# Patient Record
Sex: Female | Born: 1945 | Race: Asian | Marital: Married
Health system: Western US, Academic
[De-identification: ages and names within clinical notes are randomized; demographics above are authoritative.]

## PROBLEM LIST (undated history)

## (undated) DIAGNOSIS — I10 Essential (primary) hypertension: Secondary | ICD-10-CM

## (undated) DIAGNOSIS — J45909 Unspecified asthma, uncomplicated: Secondary | ICD-10-CM

## (undated) HISTORY — PX: NECK SURGERY: SHX720

## (undated) HISTORY — PX: HIP SURGERY: SHX245

---

## 2018-11-28 NOTE — Progress Notes (Signed)
.      -  document signed in external system.

## 2018-11-29 NOTE — Progress Notes (Signed)
.      -  document signed in external system.

## 2019-04-14 NOTE — Telephone Encounter (Signed)
Called to pre screen patient according to the person that answered the phone pt lives in [place].    Will send a message to clinic

## 2019-04-20 LAB — COVID-19, RNA, RT-PCR/NAA - EX: COVID-19, RNA, RT-PCR/NAA - EX: NOT DETECTED

## 2019-04-23 ENCOUNTER — Ambulatory Visit: Admit: 2019-04-23 | Discharge: 2019-04-23 | Payer: MEDICAID

## 2019-11-03 ENCOUNTER — Encounter (HOSPITAL_BASED_OUTPATIENT_CLINIC_OR_DEPARTMENT_OTHER): Payer: Self-pay

## 2019-11-03 ENCOUNTER — Emergency Department (HOSPITAL_BASED_OUTPATIENT_CLINIC_OR_DEPARTMENT_OTHER): Payer: Medicaid Other

## 2019-11-03 ENCOUNTER — Other Ambulatory Visit: Payer: Self-pay

## 2019-11-03 ENCOUNTER — Emergency Department (HOSPITAL_BASED_OUTPATIENT_CLINIC_OR_DEPARTMENT_OTHER)
Admission: EM | Admit: 2019-11-03 | Discharge: 2019-11-04 | Disposition: A | Discharge status: Home / Self Care | Payer: Medicaid Other | Attending: Emergency Medicine | Admitting: Emergency Medicine

## 2019-11-03 DIAGNOSIS — R0602 Shortness of breath: Secondary | ICD-10-CM | POA: Diagnosis not present

## 2019-11-03 DIAGNOSIS — J45909 Unspecified asthma, uncomplicated: Secondary | ICD-10-CM | POA: Insufficient documentation

## 2019-11-03 DIAGNOSIS — I10 Essential (primary) hypertension: Secondary | ICD-10-CM | POA: Diagnosis not present

## 2019-11-03 HISTORY — DX: Essential (primary) hypertension: I10

## 2019-11-03 HISTORY — DX: Unspecified asthma, uncomplicated: J45.909

## 2019-11-03 MED ORDER — FUROSEMIDE 10 MG/ML IJ SOLN
20.0000 mg | Freq: Once | INTRAMUSCULAR | Status: AC
  Administered 2019-11-03: 20 mg via INTRAVENOUS
  Filled 2019-11-03: qty 2

## 2019-11-03 NOTE — ED Triage Notes (Addendum)
Pt c/o SOB x 2 weeks-to triage in w/c-NAD-daughter allowed to come in with pt due to language/understanding barrier-she states pt with hx of asthma-SOB x 3 days-no relief with albuterol inhaler-pt now denies CP with daughter interpreting-daughter states pt pain site is lower back with hx "she needs surgery on her spine"-pt moved from [place] to [place] in Oct 2020

## 2019-11-03 NOTE — ED Provider Notes (Signed)
[facility] Provider Note: Lowella Dell, MD, FACEP  CSN: [id number] MRN: [id number] ARRIVAL: 11/03/19 at 2158 ROOM: MH10/[facility]10   CHIEF COMPLAINT  Shortness of Breath   HISTORY OF PRESENT ILLNESS  11/03/19 11:11 PM Denise Leon is a 74 y.o. female who moved here from [place] in October 2020 I does not yet have a local PCP.  She is here with 2 weeks of shortness of breath.  Symptoms are moderate and worse when lying supine.  She has been using her albuterol inhaler without relief.  Symptoms have worsened over the past 2 days.  She denies chest pain, cough, fever or chills.  She denies nausea, vomiting or diarrhea.  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   Past Medical History:  Diagnosis Date  . Asthma   . Hypertension     Past Surgical History:  Procedure Laterality Date  . HIP SURGERY    . NECK SURGERY      No family history on file.  Social History   Tobacco Use  . Smoking status: Never Smoker  . Smokeless tobacco: Never Used  Substance Use Topics  . Alcohol use: Never  . Drug use: Never    Prior to Admission medications   Medication Sig Start Date End Date Taking? Authorizing Provider  albuterol (VENTOLIN HFA) 108 (90 Base) MCG/ACT inhaler Inhale into the lungs every 6 (six) hours as needed for wheezing or shortness of breath.   Yes [provider]    Allergies Patient has no known allergies.   REVIEW OF SYSTEMS  Negative except as noted here or in the History of Present Illness.   PHYSICAL EXAMINATION  Initial Vital Signs Blood pressure (!) 149/107, pulse 79, temperature 97.9 F (36.6 C), temperature source Oral, resp. rate 20, SpO2 98 %.  Examination General: Well-developed, well-nourished female in no acute distress; appearance consistent with age of record HENT: normocephalic; atraumatic Eyes: pupils equal, round and reactive to light; extraocular  muscles intact Neck: supple Heart: regular rate and rhythm Lungs: clear to auscultation bilaterally; mild tachypnea with shallow breaths Abdomen: soft; nondistended; nontender; bowel sounds present Extremities: No deformity; full range of motion; pulses normal Neurologic: Awake, alert; motor function intact in all extremities and symmetric; no facial droop Skin: Warm and dry Psychiatric: Normal mood and affect   RESULTS  Summary of this visit's results, reviewed and interpreted by myself:   EKG Interpretation  Date/Time:  Tuesday November 03 2019 22:03:36 EST Ventricular Rate:  80 PR Interval:  162 QRS Duration: 80 QT Interval:  390 QTC Calculation: 449 R Axis:   11 Text Interpretation: Normal sinus rhythm Left atrial enlargement No previous ECGs available Confirmed by Di Jasmer, Jonny Ruiz ([id number]) on 11/03/2019 10:44:28 PM      Laboratory Studies: Results for orders placed or performed during the hospital encounter of 11/03/19 (from the past 24 hour(s))  CBC with Differential/Platelet     Status: Abnormal   Collection Time: 11/03/19 11:52 PM  Result Value Ref Range   WBC 11.0 (H) 4.0 - 10.5 K/uL   RBC 3.72 (L) 3.87 - 5.11 MIL/uL   Hemoglobin 11.7 (L) 12.0 - 15.0 g/dL   HCT 69.7 (L) 94.8 - 01.6 %   MCV 93.0 80.0 - 100.0 fL   MCH 31.5 26.0 - 34.0 pg   MCHC 33.8 30.0 - 36.0 g/dL   RDW 55.3 74.8 - 27.0 %   Platelets 281 150 -  400 K/uL   nRBC 0.0 0.0 - 0.2 %   Neutrophils Relative % 57 %   Neutro Abs 6.3 1.7 - 7.7 K/uL   Lymphocytes Relative 31 %   Lymphs Abs 3.4 0.7 - 4.0 K/uL   Monocytes Relative 8 %   Monocytes Absolute 0.9 0.1 - 1.0 K/uL   Eosinophils Relative 3 %   Eosinophils Absolute 0.3 0.0 - 0.5 K/uL   Basophils Relative 1 %   Basophils Absolute 0.1 0.0 - 0.1 K/uL   Immature Granulocytes 0 %   Abs Immature Granulocytes 0.04 0.00 - 0.07 K/uL  Basic metabolic panel     Status: Abnormal   Collection Time: 11/03/19 11:52 PM  Result Value Ref Range   Sodium 138 135 - 145  mmol/L   Potassium 3.6 3.5 - 5.1 mmol/L   Chloride 105 98 - 111 mmol/L   CO2 23 22 - 32 mmol/L   Glucose, Bld 117 (H) 70 - 99 mg/dL   BUN 36 (H) 8 - 23 mg/dL   Creatinine, Ser 1.60 (H) 0.44 - 1.00 mg/dL   Calcium 9.1 8.9 - 10.3 mg/dL   GFR calc non Af Amer 32 (L) >60 mL/min   GFR calc Af Amer 37 (L) >60 mL/min   Anion gap 10 5 - 15  Troponin I (High Sensitivity)     Status: None   Collection Time: 11/03/19 11:52 PM  Result Value Ref Range   Troponin I (High Sensitivity) 5 <18 ng/L  Brain natriuretic peptide     Status: None   Collection Time: 11/03/19 11:52 PM  Result Value Ref Range   B Natriuretic Peptide 65.5 0.0 - 100.0 pg/mL   Imaging Studies: DG Chest 1 View  Result Date: 11/03/2019 CLINICAL DATA:  Shortness of breath EXAM: CHEST  1 VIEW COMPARISON:  None. FINDINGS: The lung volumes are somewhat low. The heart size is borderline enlarged. Aortic calcifications are noted. There may be some mild vascular congestion without overt pulmonary edema. There are end-stage degenerative changes of both glenohumeral joints, left worse than right. There is no pneumothorax. There is mild blunting of the left costophrenic angle. IMPRESSION: 1. Possible mild vascular congestion without overt pulmonary edema. 2. Possible small left pleural effusion. 3. 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 Electronically Signed   By: Constance Holster M.D.   On: 11/03/2019 22:59    ED COURSE and MDM  Nursing notes, initial and subsequent vitals signs, including pulse oximetry, reviewed and interpreted by myself.  Vitals:   11/03/19 2214 11/04/19 0042 11/04/19 0112  BP: (!) 149/107 (!) 148/92   Pulse: 79 80   Resp: 20 20   Temp: 97.9 F (36.6 C) 98.3 F (36.8 C)   TempSrc: Oral Oral   SpO2: 98% 94% 95%   Medications  furosemide (LASIX) injection 20 mg (20 mg Intravenous Given 11/03/19 2352)   1:55 AM Chest x-ray suggestive of early congestive heart failure although BNP is normal.   Patient was given Lasix 20 mg IV with brisk diuresis with equivocal improvement in her breathing.  She was given an AeroChamber for her inhaler and instructed in its use.  Her work of breathing and subjective dyspnea have improved.  She was advised she may have some lung disease that cannot be fully assessed in the ED and she will need to establish with a primary care physician.  We will provide a referral.   PROCEDURES  Procedures   ED DIAGNOSES     ICD-10-CM  1. Shortness of breath  R06.02        Graciella Arment, Jonny Ruiz, MD 11/04/19 [phone]

## 2019-11-04 LAB — CBC WITH DIFFERENTIAL/PLATELET
Abs Immature Granulocytes: 0.04 10*3/uL (ref 0.00–0.07)
Basophils Absolute: 0.1 10*3/uL (ref 0.0–0.1)
Basophils Relative: 1 %
Eosinophils Absolute: 0.3 10*3/uL (ref 0.0–0.5)
Eosinophils Relative: 3 %
HCT: 34.6 % — ABNORMAL LOW (ref 36.0–46.0)
Hemoglobin: 11.7 g/dL — ABNORMAL LOW (ref 12.0–15.0)
Immature Granulocytes: 0 %
Lymphocytes Relative: 31 %
Lymphs Abs: 3.4 10*3/uL (ref 0.7–4.0)
MCH: 31.5 pg (ref 26.0–34.0)
MCHC: 33.8 g/dL (ref 30.0–36.0)
MCV: 93 fL (ref 80.0–100.0)
Monocytes Absolute: 0.9 10*3/uL (ref 0.1–1.0)
Monocytes Relative: 8 %
Neutro Abs: 6.3 10*3/uL (ref 1.7–7.7)
Neutrophils Relative %: 57 %
Platelets: 281 10*3/uL (ref 150–400)
RBC: 3.72 MIL/uL — ABNORMAL LOW (ref 3.87–5.11)
RDW: 11.9 % (ref 11.5–15.5)
WBC: 11 10*3/uL — ABNORMAL HIGH (ref 4.0–10.5)
nRBC: 0 % (ref 0.0–0.2)

## 2019-11-04 LAB — BRAIN NATRIURETIC PEPTIDE: B Natriuretic Peptide: 65.5 pg/mL (ref 0.0–100.0)

## 2019-11-04 LAB — BASIC METABOLIC PANEL
Anion gap: 10 (ref 5–15)
BUN: 36 mg/dL — ABNORMAL HIGH (ref 8–23)
CO2: 23 mmol/L (ref 22–32)
Calcium: 9.1 mg/dL (ref 8.9–10.3)
Chloride: 105 mmol/L (ref 98–111)
Creatinine, Ser: 1.6 mg/dL — ABNORMAL HIGH (ref 0.44–1.00)
GFR calc Af Amer: 37 mL/min — ABNORMAL LOW (ref >60)
GFR calc non Af Amer: 32 mL/min — ABNORMAL LOW (ref >60)
Glucose, Bld: 117 mg/dL — ABNORMAL HIGH (ref 70–99)
Potassium: 3.6 mmol/L (ref 3.5–5.1)
Sodium: 138 mmol/L (ref 135–145)

## 2019-11-04 LAB — TROPONIN I (HIGH SENSITIVITY): Troponin I (High Sensitivity): 5 ng/L (ref <18)

## 2019-11-06 ENCOUNTER — Encounter (HOSPITAL_BASED_OUTPATIENT_CLINIC_OR_DEPARTMENT_OTHER): Payer: Self-pay | Admitting: *Deleted

## 2019-11-06 ENCOUNTER — Emergency Department (HOSPITAL_BASED_OUTPATIENT_CLINIC_OR_DEPARTMENT_OTHER)
Admission: EM | Admit: 2019-11-06 | Discharge: 2019-11-06 | Disposition: A | Discharge status: Home / Self Care | Payer: Medicaid Other | Attending: Emergency Medicine | Admitting: Emergency Medicine

## 2019-11-06 ENCOUNTER — Emergency Department (HOSPITAL_BASED_OUTPATIENT_CLINIC_OR_DEPARTMENT_OTHER): Payer: Medicaid Other

## 2019-11-06 ENCOUNTER — Other Ambulatory Visit: Payer: Self-pay

## 2019-11-06 DIAGNOSIS — Z20822 Contact with and (suspected) exposure to covid-19: Secondary | ICD-10-CM | POA: Insufficient documentation

## 2019-11-06 DIAGNOSIS — I10 Essential (primary) hypertension: Secondary | ICD-10-CM | POA: Diagnosis not present

## 2019-11-06 DIAGNOSIS — J45909 Unspecified asthma, uncomplicated: Secondary | ICD-10-CM | POA: Insufficient documentation

## 2019-11-06 DIAGNOSIS — N289 Disorder of kidney and ureter, unspecified: Secondary | ICD-10-CM

## 2019-11-06 DIAGNOSIS — R0602 Shortness of breath: Secondary | ICD-10-CM | POA: Diagnosis not present

## 2019-11-06 LAB — BASIC METABOLIC PANEL
Anion gap: 9 (ref 5–15)
BUN: 51 mg/dL — ABNORMAL HIGH (ref 8–23)
CO2: 25 mmol/L (ref 22–32)
Calcium: 9.1 mg/dL (ref 8.9–10.3)
Chloride: 106 mmol/L (ref 98–111)
Creatinine, Ser: 2 mg/dL — ABNORMAL HIGH (ref 0.44–1.00)
GFR calc Af Amer: 28 mL/min — ABNORMAL LOW (ref >60)
GFR calc non Af Amer: 24 mL/min — ABNORMAL LOW (ref >60)
Glucose, Bld: 108 mg/dL — ABNORMAL HIGH (ref 70–99)
Potassium: 3.4 mmol/L — ABNORMAL LOW (ref 3.5–5.1)
Sodium: 140 mmol/L (ref 135–145)

## 2019-11-06 LAB — CBC WITH DIFFERENTIAL/PLATELET
Abs Immature Granulocytes: 0.04 10*3/uL (ref 0.00–0.07)
Basophils Absolute: 0.1 10*3/uL (ref 0.0–0.1)
Basophils Relative: 0 %
Eosinophils Absolute: 0.3 10*3/uL (ref 0.0–0.5)
Eosinophils Relative: 3 %
HCT: 38.7 % (ref 36.0–46.0)
Hemoglobin: 12.6 g/dL (ref 12.0–15.0)
Immature Granulocytes: 0 %
Lymphocytes Relative: 30 %
Lymphs Abs: 3.7 10*3/uL (ref 0.7–4.0)
MCH: 31.4 pg (ref 26.0–34.0)
MCHC: 32.6 g/dL (ref 30.0–36.0)
MCV: 96.5 fL (ref 80.0–100.0)
Monocytes Absolute: 1.1 10*3/uL — ABNORMAL HIGH (ref 0.1–1.0)
Monocytes Relative: 9 %
Neutro Abs: 7 10*3/uL (ref 1.7–7.7)
Neutrophils Relative %: 58 %
Platelets: 299 10*3/uL (ref 150–400)
RBC: 4.01 MIL/uL (ref 3.87–5.11)
RDW: 11.9 % (ref 11.5–15.5)
WBC: 12.2 10*3/uL — ABNORMAL HIGH (ref 4.0–10.5)
nRBC: 0 % (ref 0.0–0.2)

## 2019-11-06 LAB — SARS CORONAVIRUS 2 AG (30 MIN TAT): SARS Coronavirus 2 Ag: NEGATIVE

## 2019-11-06 MED ORDER — SODIUM CHLORIDE 0.9 % IV BOLUS
1000.0000 mL | Freq: Once | INTRAVENOUS | Status: AC
  Administered 2019-11-06: 1000 mL via INTRAVENOUS

## 2019-11-06 MED ORDER — IOHEXOL 350 MG/ML SOLN
100.0000 mL | Freq: Once | INTRAVENOUS | Status: AC | PRN
  Administered 2019-11-06: 59 mL via INTRAVENOUS

## 2019-11-06 NOTE — ED Notes (Signed)
Reviewed d/c instructions with pt in room and her daughter on speaker phone

## 2019-11-06 NOTE — Discharge Instructions (Addendum)
Important - discharge summary - please share with your family  You were seen in the Emergency Room for shortness of breath.  You primary care provider Michelle Nasuti) wanted you to come to the emergency room, because you had an abnormal blood test, called a "Ddimer."  This is a test that looks for possible blood clots.  Because of this test, we performed a CT scan of your lungs to look for blood clots.  We did NOT find any blood clots in your lungs.  Your lungs look healthy.  Your COVID-19 test was NEGATIVE.  I also explained to you that you have kidney disease on your bloodwork.  This needs your attention.  We gave you IV contrast for your CT scan.  This MAY cause worsening kidney injury. It is very important that your doctor rechecks your kidney function (called a "creatinine level") in 3-5 days.   In the meantime, drink LOTS of water at home to keep your kidneys healthy.  Finally, you told me about your spine problems.  It is also important that you see a neurosurgeon, or a spine specialist, for this condition.  Problems with your spinal cord can lead to weakness in your legs, or even paralysis in your legs.  I've included their phone number in your papers and circled it.  If you are confused about any of these directions, call your primary care clinic Michelle Nasuti) and ask them to look at the medical record for your visit to the ER today.

## 2019-11-06 NOTE — ED Provider Notes (Addendum)
[facility] EMERGENCY DEPARTMENT Provider Note   CSN: [id number] Arrival date & time: 11/06/19  1428     History Chief Complaint  Patient presents with  . Abnormal Lab    Ahria Slappey is a 74 y.o. female with a history of hypertension, HLD, and asthma presenting to the emergency department with concern for abnormal lab.  Patient was seen in the ED a week ago on 11/03/2019 (per her PCP Lauren O'Connor's note) complaining of shortness of breath.  She had a cardiac workup including negative serial troponins and a normal BNP.    She subsequently establish care with a primary care physician for [facility], and was seen in the office and had blood work done.  She was contacted today because her D-dimer was elevated and was told to come to the emergency department.  Here in the ED she continues to feel some mild chest tightness, shortness of breath with exertion, but otherwise is feeling better than she did a week ago.  She denies any chest pain.  She denies any swelling or pain in her lower extremities, in her calfs or her legs.  She denies any prior history of blood clots in her legs or her lungs.  She denies any fevers or chills or recent Covid testing.  She recently moved here from [place] is living with her daughter.  Of note, the patient reports that while she was living in [place] she was told approximately 8 months ago after an MRI that she may have a spinal cord compression.  She was advised to undergo outpatient surgery, but declined of surgery at that time because she was frightened of surgery.  She had developed bilateral leg numbness at that time, which is no change.  She does walk with a walker for the past 8 months.  She denies that she has had any new weakness in her legs.  She denies any urinary hesitation or frequency.  She denies trauma to the back.     HPI     Past Medical History:  Diagnosis Date  . Asthma   . Hypertension     There are no problems to  display for this patient.   Past Surgical History:  Procedure Laterality Date  . HIP SURGERY    . NECK SURGERY       OB History   No obstetric history on file.     No family history on file.  Social History   Tobacco Use  . Smoking status: Never Smoker  . Smokeless tobacco: Never Used  Substance Use Topics  . Alcohol use: Never  . Drug use: Never    Home Medications Prior to Admission medications   Medication Sig Start Date End Date Taking? Authorizing Provider  albuterol (VENTOLIN HFA) 108 (90 Base) MCG/ACT inhaler Inhale into the lungs every 6 (six) hours as needed for wheezing or shortness of breath.    [provider]    Allergies    Patient has no known allergies.  Review of Systems   Review of Systems  Constitutional: Negative for chills and fever.  Eyes: Negative for pain and visual disturbance.  Respiratory: Positive for shortness of breath. Negative for cough.   Cardiovascular: Negative for chest pain and leg swelling.  Gastrointestinal: Negative for abdominal pain and vomiting.  Genitourinary: Negative for dysuria and hematuria.  Musculoskeletal: Negative for arthralgias and back pain.  Skin: Negative for color change and rash.  Neurological: Positive for weakness. Negative for seizures, syncope, light-headedness, numbness  and headaches.  Psychiatric/Behavioral: Negative for agitation and confusion.  All other systems reviewed and are negative.   Physical Exam Updated Vital Signs BP (!) 149/83 (BP Location: Right Arm)   Pulse 87   Temp 98.1 F (36.7 C) (Oral)   Resp 16   Ht 5\' 2"  (1.575 m)   Wt 63 kg   SpO2 100%   BMI 25.42 kg/m   Physical Exam Vitals and nursing note reviewed.  Constitutional:      General: She is not in acute distress.    Appearance: She is well-developed.  HENT:     Head: Normocephalic and atraumatic.  Eyes:     Conjunctiva/sclera: Conjunctivae normal.  Cardiovascular:     Rate and Rhythm: Normal rate and  regular rhythm.     Heart sounds: No murmur.  Pulmonary:     Effort: Pulmonary effort is normal. No respiratory distress.     Breath sounds: Normal breath sounds.  Abdominal:     Palpations: Abdomen is soft.     Tenderness: There is no abdominal tenderness.  Musculoskeletal:     Cervical back: Neck supple.  Skin:    General: Skin is warm and dry.  Neurological:     General: No focal deficit present.     Mental Status: She is alert and oriented to person, place, and time.     Sensory: No sensory deficit.     Motor: No weakness.     Comments: 5/5 strength in the lower extremities     ED Results / Procedures / Treatments   Labs (all labs ordered are listed, but only abnormal results are displayed) Labs Reviewed  BASIC METABOLIC PANEL - Abnormal; Notable for the following components:      Result Value   Potassium 3.4 (*)    Glucose, Bld 108 (*)    BUN 51 (*)    Creatinine, Ser 2.00 (*)    GFR calc non Af Amer 24 (*)    GFR calc Af Amer 28 (*)    All other components within normal limits  CBC WITH DIFFERENTIAL/PLATELET - Abnormal; Notable for the following components:   WBC 12.2 (*)    Monocytes Absolute 1.1 (*)    All other components within normal limits  SARS CORONAVIRUS 2 AG (30 MIN TAT)    EKG None  Radiology CT Angio Chest PE W and/or Wo Contrast  Result Date: 11/06/2019 CLINICAL DATA:  Shortness of breath. EXAM: CT ANGIOGRAPHY CHEST WITH CONTRAST TECHNIQUE: Multidetector CT imaging of the chest was performed using the standard protocol during bolus administration of intravenous contrast. Multiplanar CT image reconstructions and MIPs were obtained to evaluate the vascular anatomy. CONTRAST:  86mL OMNIPAQUE IOHEXOL 350 MG/ML SOLN COMPARISON:  None. FINDINGS: Cardiovascular: There is moderate severity calcification atherosclerosis throughout the thoracic aorta. Satisfactory opacification of the pulmonary arteries to the segmental level. No evidence of pulmonary embolism.  Normal heart size. No pericardial effusion. Mediastinum/Nodes: No enlarged mediastinal, hilar, or axillary lymph nodes. The left lobe of the thyroid gland is lobulated and heterogeneous in appearance. Lungs/Pleura: Lungs are clear. No pleural effusion or pneumothorax. Upper Abdomen: A 1.4 cm simple cyst is seen within the posteromedial aspect of the upper pole of the right kidney. A 4.1 cm cyst is seen within the upper pole of the left kidney. Musculoskeletal: Multilevel degenerative changes seen throughout the thoracic spine. Noninflamed diverticula are seen within the large bowel. Review of the MIP images confirms the above findings. IMPRESSION: 1. No evidence of pulmonary  embolism or acute cardiopulmonary disease. 2. Bilateral simple renal cysts. 3. Colonic diverticulosis. Electronically Signed   By: Aram Candela M.D.   On: 11/06/2019 19:57    Procedures Procedures (including critical care time)  Medications Ordered in ED Medications  sodium chloride 0.9 % bolus 1,000 mL ( Intravenous Stopped 11/06/19 1838)  iohexol (OMNIPAQUE) 350 MG/ML injection 100 mL (59 mLs Intravenous Contrast Given 11/06/19 1925)  sodium chloride 0.9 % bolus 1,000 mL (0 mLs Intravenous Stopped 11/06/19 2134)    ED Course  I have reviewed the triage vital signs and the nursing notes.  Pertinent labs & imaging results that were available during my care of the patient were reviewed by me and considered in my medical decision making (see chart for details).  This is a well appearing 74 yo female who presents to the ED after having an abnormal DDimer test as an outpatient, in the setting of 1 week of shortness of breath.  No prior hx of DVT or PE.  No signs or symptoms of DVT on exam.    The patient continues to feel mildly SOB sitting in the bed.  She is not hypoxic, but she does have tachycardia.  I believe it is reasonable to proceed with CT PE at this time.  She does have borderline renal function, but this appears to  be a pre-renal pattern with her BUN elevation, and I suspect we can hydrate her fully before and after her contrast load to offset possible kidney injury.  I did discuss this at length with the patient, and offered to do so again with a translator, but she insisted she understood my English quite well.  I made her aware of the risks of contrast, and advised that her PCP repeat her Cr level in 3 days - as detailed in the patient's discharge summary.  Otherwise her covid test is negative.    I have a much lower suspicion for ACS given this clinical presentation. She had negative serial troponins 1 week ago in another ED, per her PCP note.  She also had a negative BNP at that time.  Clinically she does not appear to be having an acute CHF exacerbation.  Clinical Course as of Nov 06 1145  Fri Nov 06, 2019  1851 I spoke to Dr Juel Burrow of nephrology per our PE protocol here and reviewed the case, he agrees this is likely a pre-renal kidney issue with BUN: Creatinine ratio, and believes with IV hydration it's okay to give contrast.  We'll get the study and have the patient repeat a Cr in 2-3 days as an outpatient if we are discharging her   [MT]  2000  IMPRESSION: 1. No evidence of pulmonary embolism or acute cardiopulmonary disease. 2. Bilateral simple renal cysts. 3. Colonic diverticulosis.   [MT]  2000 No PE.  Will finish 2nd liter IVF, discharge and have her f/u with her PCP for repeat Cr check    [MT]  2041 Patient informed of CT results.  I will discharge her.  We also discussed rechecking her kidney function in 3 days.  I will give her neurosurgery information as well for her spine condition, she will think more about whether she would be willing to have spinal surgery - but she likely needs an MRI to confirm this spinal condition   [MT]    Clinical Course User Index [MT] Jomes Giraldo, Kermit Balo, MD    Final Clinical Impression(s) / ED Diagnoses Final diagnoses:  Shortness of breath  Kidney  disease    Rx / DC Orders ED Discharge Orders    None       Charity Tessier, Kermit Balo, MD 11/07/19 1147    Terald Sleeper, MD 11/07/19 [phone]

## 2019-11-06 NOTE — ED Notes (Signed)
Pt transported to CT ?

## 2019-11-06 NOTE — ED Triage Notes (Signed)
Pt was seen here 3 days ago with SOB. Here today with c.o numbness in her legs. 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 She was seen by her MD this am and her D dimer was elevated.

## 2019-11-13 ENCOUNTER — Inpatient Hospital Stay (HOSPITAL_BASED_OUTPATIENT_CLINIC_OR_DEPARTMENT_OTHER)
Admission: EM | Admit: 2019-11-13 | Discharge: 2019-11-16 | DRG: 065 | Disposition: A | Discharge status: Rehabilitation Facility | Payer: Medicare Other | Attending: Neurology | Admitting: Neurology

## 2019-11-13 ENCOUNTER — Other Ambulatory Visit: Payer: Self-pay

## 2019-11-13 ENCOUNTER — Emergency Department (HOSPITAL_BASED_OUTPATIENT_CLINIC_OR_DEPARTMENT_OTHER): Payer: Medicare Other

## 2019-11-13 DIAGNOSIS — E785 Hyperlipidemia, unspecified: Secondary | ICD-10-CM | POA: Diagnosis present

## 2019-11-13 DIAGNOSIS — Z9282 Status post administration of tPA (rtPA) in a different facility within the last 24 hours prior to admission to current facility: Secondary | ICD-10-CM

## 2019-11-13 DIAGNOSIS — N1832 Chronic kidney disease, stage 3b: Secondary | ICD-10-CM | POA: Diagnosis not present

## 2019-11-13 DIAGNOSIS — I6340 Cerebral infarction due to embolism of unspecified cerebral artery: Principal | ICD-10-CM | POA: Diagnosis present

## 2019-11-13 DIAGNOSIS — R29705 NIHSS score 5: Secondary | ICD-10-CM | POA: Diagnosis present

## 2019-11-13 DIAGNOSIS — R002 Palpitations: Secondary | ICD-10-CM | POA: Diagnosis present

## 2019-11-13 DIAGNOSIS — I129 Hypertensive chronic kidney disease with stage 1 through stage 4 chronic kidney disease, or unspecified chronic kidney disease: Secondary | ICD-10-CM | POA: Diagnosis not present

## 2019-11-13 DIAGNOSIS — E041 Nontoxic single thyroid nodule: Secondary | ICD-10-CM | POA: Diagnosis present

## 2019-11-13 DIAGNOSIS — G8191 Hemiplegia, unspecified affecting right dominant side: Secondary | ICD-10-CM | POA: Diagnosis not present

## 2019-11-13 DIAGNOSIS — I6381 Other cerebral infarction due to occlusion or stenosis of small artery: Secondary | ICD-10-CM

## 2019-11-13 DIAGNOSIS — Z20822 Contact with and (suspected) exposure to covid-19: Secondary | ICD-10-CM | POA: Diagnosis present

## 2019-11-13 DIAGNOSIS — Z79899 Other long term (current) drug therapy: Secondary | ICD-10-CM

## 2019-11-13 DIAGNOSIS — I639 Cerebral infarction, unspecified: Secondary | ICD-10-CM | POA: Diagnosis present

## 2019-11-13 DIAGNOSIS — J45909 Unspecified asthma, uncomplicated: Secondary | ICD-10-CM | POA: Diagnosis present

## 2019-11-13 DIAGNOSIS — N183 Chronic kidney disease, stage 3 unspecified: Secondary | ICD-10-CM | POA: Diagnosis present

## 2019-11-13 DIAGNOSIS — I10 Essential (primary) hypertension: Secondary | ICD-10-CM | POA: Diagnosis present

## 2019-11-13 LAB — DIFFERENTIAL
Abs Immature Granulocytes: 0.06 10*3/uL (ref 0.00–0.07)
Basophils Absolute: 0 10*3/uL (ref 0.0–0.1)
Basophils Relative: 0 %
Eosinophils Absolute: 0.2 10*3/uL (ref 0.0–0.5)
Eosinophils Relative: 2 %
Immature Granulocytes: 1 %
Lymphocytes Relative: 34 %
Lymphs Abs: 3.9 10*3/uL (ref 0.7–4.0)
Monocytes Absolute: 1 10*3/uL (ref 0.1–1.0)
Monocytes Relative: 9 %
Neutro Abs: 6 10*3/uL (ref 1.7–7.7)
Neutrophils Relative %: 54 %

## 2019-11-13 LAB — CBG MONITORING, ED: Glucose-Capillary: 119 mg/dL — ABNORMAL HIGH (ref 70–99)

## 2019-11-13 LAB — CBC
HCT: 34.6 % — ABNORMAL LOW (ref 36.0–46.0)
Hemoglobin: 11.7 g/dL — ABNORMAL LOW (ref 12.0–15.0)
MCH: 31.2 pg (ref 26.0–34.0)
MCHC: 33.8 g/dL (ref 30.0–36.0)
MCV: 92.3 fL (ref 80.0–100.0)
Platelets: 318 10*3/uL (ref 150–400)
RBC: 3.75 MIL/uL — ABNORMAL LOW (ref 3.87–5.11)
RDW: 12 % (ref 11.5–15.5)
WBC: 11.2 10*3/uL — ABNORMAL HIGH (ref 4.0–10.5)
nRBC: 0 % (ref 0.0–0.2)

## 2019-11-13 MED ORDER — SODIUM CHLORIDE 0.9 % IV SOLN: 50.0000 mL | Freq: Once | INTRAVENOUS | Status: DC

## 2019-11-13 MED ORDER — NICARDIPINE HCL IN NACL 20-0.86 MG/200ML-% IV SOLN
0.0000 mg/h | INTRAVENOUS | Status: DC
  Filled 2019-11-13: qty 200

## 2019-11-13 MED ORDER — ALTEPLASE (STROKE) FULL DOSE INFUSION
0.9000 mg/kg | Freq: Once | INTRAVENOUS | Status: AC
  Administered 2019-11-14: 56.7 mg via INTRAVENOUS
  Filled 2019-11-13: qty 100

## 2019-11-13 MED ORDER — LABETALOL HCL 5 MG/ML IV SOLN
20.0000 mg | Freq: Once | INTRAVENOUS | Status: DC
  Filled 2019-11-13: qty 4

## 2019-11-13 MED ORDER — ALTEPLASE 100 MG IV SOLR
INTRAVENOUS | Status: AC
  Filled 2019-11-13: qty 100

## 2019-11-13 NOTE — ED Provider Notes (Signed)
[facility] Provider Note: Lowella DellJ. Lane Anaalicia Reimann, MD, FACEP  CSN: [id number] MRN: [id number] ARRIVAL: 11/13/19 at 2300 ROOM: MH10/MH10   CHIEF COMPLAINT  Weakness   HISTORY OF PRESENT ILLNESS  11/13/19 11:12 PM Denise Leon is a 74 y.o. female who took 5 mg of melatonin about 2 hours ago.  She had never taken melatonin before.  Soon after taking the melatonin she developed right-sided headache which she describes as a 5 out of 10.  She subsequently developed an inability to move the right upper extremity.  That arm is described as completely flaccid.  She denies weakness elsewhere.  Her daughter, who accompanies her, denies any change in gait or speech.  She does not have a facial droop.  She had her antihypertensive changed from chlorthalidone to amlodipine 5 mg about a week ago.   Past Medical History:  Diagnosis Date  . Asthma   . Hypertension     Past Surgical History:  Procedure Laterality Date  . HIP SURGERY    . NECK SURGERY      No family history on file.  Social History   Tobacco Use  . Smoking status: Never Smoker  . Smokeless tobacco: Never Used  Substance Use Topics  . Alcohol use: Never  . Drug use: Never    Prior to Admission medications   Medication Sig Start Date End Date Taking? Authorizing Provider  albuterol (VENTOLIN HFA) 108 (90 Base) MCG/ACT inhaler Inhale into the lungs every 6 (six) hours as needed for wheezing or shortness of breath.    [provider]    Allergies Patient has no known allergies.   REVIEW OF SYSTEMS  Negative except as noted here or in the History of Present Illness.   PHYSICAL EXAMINATION  Initial Vital Signs Blood pressure (!) 154/103, pulse (!) 105, temperature 98.5 F (36.9 C), resp. rate 20, height 5\' 2"  (1.575 m), weight 63 kg, SpO2 98 %.  Examination General: Well-developed, well-nourished female in no acute distress; appearance consistent with age of record HENT: normocephalic;  atraumatic Eyes: pupils equal, round and reactive to light; extraocular muscles intact Neck: supple; no bruit Heart: regular rate and rhythm; frequent PACs Lungs: clear to auscultation bilaterally Abdomen: soft; nondistended; nontender; bowel sounds present Extremities: No deformity; full range of motion; pulses normal Neurologic: Awake, alert and oriented; no facial droop; flaccid paralysis of the right upper extremity, no weakness of the other 3 extremities; sensation intact and symmetric bilaterally Skin: Warm and dry Psychiatric: Flat affect   RESULTS  Summary of this visit's results, reviewed and interpreted by myself:   EKG Interpretation  Date/Time:  Friday November 13 2019 23:19:24 EST Ventricular Rate:  98 PR Interval:    QRS Duration: 87 QT Interval:  339 QTC Calculation: 433 R Axis:   13 Text Interpretation: Sinus tachycardia Atrial premature complexes Low voltage, precordial leads Nonspecific T abnormalities, lateral leads PACs not seen previously Confirmed by Davaun Quintela ([id number]) on 11/13/2019 11:24:05 PM      Laboratory Studies: Results for orders placed or performed during the hospital encounter of 11/13/19 (from the past 24 hour(s))  CBG monitoring, ED     Status: Abnormal   Collection Time: 11/13/19 11:20 PM  Result Value Ref Range   Glucose-Capillary 119 (H) 70 - 99 mg/dL  Ethanol     Status: None   Collection Time: 11/13/19 11:23 PM  Result Value Ref Range   Alcohol, Ethyl (B) <10 <10 mg/dL  Protime-INR     Status: None  Collection Time: 11/13/19 11:23 PM  Result Value Ref Range   Prothrombin Time 11.8 11.4 - 15.2 seconds   INR 0.9 0.8 - 1.2  APTT     Status: Abnormal   Collection Time: 11/13/19 11:23 PM  Result Value Ref Range   aPTT 22 (L) 24 - 36 seconds  CBC     Status: Abnormal   Collection Time: 11/13/19 11:23 PM  Result Value Ref Range   WBC 11.2 (H) 4.0 - 10.5 K/uL   RBC 3.75 (L) 3.87 - 5.11 MIL/uL   Hemoglobin 11.7 (L) 12.0 - 15.0 g/dL    HCT 91.434.6 (L) 78.236.0 - 46.0 %   MCV 92.3 80.0 - 100.0 fL   MCH 31.2 26.0 - 34.0 pg   MCHC 33.8 30.0 - 36.0 g/dL   RDW 95.612.0 21.311.5 - 08.615.5 %   Platelets 318 150 - 400 K/uL   nRBC 0.0 0.0 - 0.2 %  Differential     Status: None   Collection Time: 11/13/19 11:23 PM  Result Value Ref Range   Neutrophils Relative % 54 %   Neutro Abs 6.0 1.7 - 7.7 K/uL   Lymphocytes Relative 34 %   Lymphs Abs 3.9 0.7 - 4.0 K/uL   Monocytes Relative 9 %   Monocytes Absolute 1.0 0.1 - 1.0 K/uL   Eosinophils Relative 2 %   Eosinophils Absolute 0.2 0.0 - 0.5 K/uL   Basophils Relative 0 %   Basophils Absolute 0.0 0.0 - 0.1 K/uL   Immature Granulocytes 1 %   Abs Immature Granulocytes 0.06 0.00 - 0.07 K/uL  Comprehensive metabolic panel     Status: Abnormal   Collection Time: 11/13/19 11:23 PM  Result Value Ref Range   Sodium 136 135 - 145 mmol/L   Potassium 3.7 3.5 - 5.1 mmol/L   Chloride 102 98 - 111 mmol/L   CO2 24 22 - 32 mmol/L   Glucose, Bld 133 (H) 70 - 99 mg/dL   BUN 28 (H) 8 - 23 mg/dL   Creatinine, Ser 5.781.49 (H) 0.44 - 1.00 mg/dL   Calcium 9.4 8.9 - 46.910.3 mg/dL   Total Protein 7.3 6.5 - 8.1 g/dL   Albumin 4.1 3.5 - 5.0 g/dL   AST 23 15 - 41 U/L   ALT 15 0 - 44 U/L   Alkaline Phosphatase 41 38 - 126 U/L   Total Bilirubin 1.1 0.3 - 1.2 mg/dL   GFR calc non Af Amer 35 (L) >60 mL/min   GFR calc Af Amer 40 (L) >60 mL/min   Anion gap 10 5 - 15  SARS Coronavirus 2 by RT PCR (hospital order, performed in [facility] lab) Nasopharyngeal Nasopharyngeal Swab     Status: None   Collection Time: 11/14/19  1:17 AM   Specimen: Nasopharyngeal Swab  Result Value Ref Range   SARS Coronavirus 2 NEGATIVE NEGATIVE  Urine rapid drug screen (hosp performed)     Status: None   Collection Time: 11/14/19  1:30 AM  Result Value Ref Range   Opiates NONE DETECTED NONE DETECTED   Cocaine NONE DETECTED NONE DETECTED   Benzodiazepines NONE DETECTED NONE DETECTED   Amphetamines NONE DETECTED NONE DETECTED    Tetrahydrocannabinol NONE DETECTED NONE DETECTED   Barbiturates NONE DETECTED NONE DETECTED  Urinalysis, Routine w reflex microscopic     Status: None   Collection Time: 11/14/19  1:30 AM  Result Value Ref Range   Color, Urine YELLOW YELLOW   APPearance CLEAR CLEAR   Specific  Gravity, Urine 1.010 1.005 - 1.030   pH 5.5 5.0 - 8.0   Glucose, UA NEGATIVE NEGATIVE mg/dL   Hgb urine dipstick NEGATIVE NEGATIVE   Bilirubin Urine NEGATIVE NEGATIVE   Ketones, ur NEGATIVE NEGATIVE mg/dL   Protein, ur NEGATIVE NEGATIVE mg/dL   Nitrite NEGATIVE NEGATIVE   Leukocytes,Ua NEGATIVE NEGATIVE   Imaging Studies: CT Angio Head W or Wo Contrast  Result Date: 11/14/2019 CLINICAL DATA:  Initial evaluation for acute right upper extremity weakness. EXAM: CT ANGIOGRAPHY HEAD AND NECK TECHNIQUE: Multidetector CT imaging of the head and neck was performed using the standard protocol during bolus administration of intravenous contrast. Multiplanar CT image reconstructions and MIPs were obtained to evaluate the vascular anatomy. Carotid stenosis measurements (when applicable) are obtained utilizing NASCET criteria, using the distal internal carotid diameter as the denominator. CONTRAST:  59mL OMNIPAQUE IOHEXOL 350 MG/ML SOLN COMPARISON:  Prior head CT from earlier the same day. FINDINGS: CTA NECK FINDINGS Aortic arch: Visualized aortic arch of normal caliber with normal 3 vessel morphology. Scattered calcified and noncalcified plaque present within the visualized arch and descending intrathoracic aorta. Scattered areas of multifocal soft plaque and/or thrombus protruding into the aortic lumen (series 7, image 20). No hemodynamically significant stenosis seen about the origin of the great vessels. Visualized subclavian arteries widely patent. Right carotid system: Right CCA widely patent from its origin to the bifurcation without stenosis. Minimal atheromatous plaque about the right bifurcation without stenosis. Right ICA  widely patent distally without stenosis, dissection or occlusion. Left carotid system: Left CCA widely patent from its origin to the bifurcation without stenosis. Mixed eccentric plaque seen at the origin of the left ICA with associated mild stenosis of no more than 30% by NASCET criteria. Left ICA patent distally to the skull base without stenosis, dissection, or occlusion. Vertebral arteries: Both vertebral arteries arise from the subclavian arteries. Vertebral arteries widely patent within the neck without stenosis, dissection or occlusion. Skeleton: No acute osseous abnormality. No discrete or worrisome osseous lesions. Patient is edentulous. Advanced cervical spondylosis noted at C5-6 and C6-7. Other neck: No other acute soft tissue abnormality within the neck. No adenopathy. Enlarged left lobe of thyroid with a few scattered nodules present, largest of which measures 19 mm (series 5, image 29). Right lobe of thyroid absent. Upper chest: Visualized upper chest demonstrates no acute abnormality. Review of the MIP images confirms the above findings CTA HEAD FINDINGS Anterior circulation: Petrous segments widely patent bilaterally. Mild scattered atheromatous plaque within the carotid siphons without hemodynamically significant stenosis. A1 segments widely patent. Normal anterior communicating artery. Anterior cerebral arteries patent to their distal aspects without stenosis. No M1 stenosis or occlusion. Negative MCA bifurcations. Distal MCA branches well perfused and symmetric. Posterior circulation: Right vertebral artery widely patent to the vertebrobasilar junction. Focal plaque within the mid left V4 segment with short-segment mild stenosis. Both posterior inferior cerebral arteries patent bilaterally. Basilar widely patent to its distal aspect without stenosis. Superior cerebral arteries patent bilaterally. Both PCAs well perfused to their distal aspects without stenosis. Small left posterior communicating  artery noted. Venous sinuses: Patent. Anatomic variants: None significant. Review of the MIP images confirms the above findings IMPRESSION: 1. Negative CTA for large vessel occlusion. 2. Mixed atheromatous plaque at the origin of the left ICA with short-segment mild 30% stenosis. 3. Moderate atherosclerotic change about the aortic arch, with more mild atherosclerotic change about the carotid siphons. No other hemodynamically significant or correctable stenosis identified. 4. 19 mm left thyroid nodule, indeterminate.  Further evaluation with dedicated thyroid ultrasound recommended. This could be performed on a nonemergent outpatient basis. Electronically Signed   By: Rise Mu M.D.   On: 11/14/2019 01:03   CT ANGIO NECK W OR WO CONTRAST  Result Date: 11/14/2019 CLINICAL DATA:  Initial evaluation for acute right upper extremity weakness. EXAM: CT ANGIOGRAPHY HEAD AND NECK TECHNIQUE: Multidetector CT imaging of the head and neck was performed using the standard protocol during bolus administration of intravenous contrast. Multiplanar CT image reconstructions and MIPs were obtained to evaluate the vascular anatomy. Carotid stenosis measurements (when applicable) are obtained utilizing NASCET criteria, using the distal internal carotid diameter as the denominator. CONTRAST:  55mL OMNIPAQUE IOHEXOL 350 MG/ML SOLN COMPARISON:  Prior head CT from earlier the same day. FINDINGS: CTA NECK FINDINGS Aortic arch: Visualized aortic arch of normal caliber with normal 3 vessel morphology. Scattered calcified and noncalcified plaque present within the visualized arch and descending intrathoracic aorta. Scattered areas of multifocal soft plaque and/or thrombus protruding into the aortic lumen (series 7, image 20). No hemodynamically significant stenosis seen about the origin of the great vessels. Visualized subclavian arteries widely patent. Right carotid system: Right CCA widely patent from its origin to the  bifurcation without stenosis. Minimal atheromatous plaque about the right bifurcation without stenosis. Right ICA widely patent distally without stenosis, dissection or occlusion. Left carotid system: Left CCA widely patent from its origin to the bifurcation without stenosis. Mixed eccentric plaque seen at the origin of the left ICA with associated mild stenosis of no more than 30% by NASCET criteria. Left ICA patent distally to the skull base without stenosis, dissection, or occlusion. Vertebral arteries: Both vertebral arteries arise from the subclavian arteries. Vertebral arteries widely patent within the neck without stenosis, dissection or occlusion. Skeleton: No acute osseous abnormality. No discrete or worrisome osseous lesions. Patient is edentulous. Advanced cervical spondylosis noted at C5-6 and C6-7. Other neck: No other acute soft tissue abnormality within the neck. No adenopathy. Enlarged left lobe of thyroid with a few scattered nodules present, largest of which measures 19 mm (series 5, image 29). Right lobe of thyroid absent. Upper chest: Visualized upper chest demonstrates no acute abnormality. Review of the MIP images confirms the above findings CTA HEAD FINDINGS Anterior circulation: Petrous segments widely patent bilaterally. Mild scattered atheromatous plaque within the carotid siphons without hemodynamically significant stenosis. A1 segments widely patent. Normal anterior communicating artery. Anterior cerebral arteries patent to their distal aspects without stenosis. No M1 stenosis or occlusion. Negative MCA bifurcations. Distal MCA branches well perfused and symmetric. Posterior circulation: Right vertebral artery widely patent to the vertebrobasilar junction. Focal plaque within the mid left V4 segment with short-segment mild stenosis. Both posterior inferior cerebral arteries patent bilaterally. Basilar widely patent to its distal aspect without stenosis. Superior cerebral arteries patent  bilaterally. Both PCAs well perfused to their distal aspects without stenosis. Small left posterior communicating artery noted. Venous sinuses: Patent. Anatomic variants: None significant. Review of the MIP images confirms the above findings IMPRESSION: 1. Negative CTA for large vessel occlusion. 2. Mixed atheromatous plaque at the origin of the left ICA with short-segment mild 30% stenosis. 3. Moderate atherosclerotic change about the aortic arch, with more mild atherosclerotic change about the carotid siphons. No other hemodynamically significant or correctable stenosis identified. 4. 19 mm left thyroid nodule, indeterminate. Further evaluation with dedicated thyroid ultrasound recommended. This could be performed on a nonemergent outpatient basis. Electronically Signed   By: Rise Mu M.D.   On: 11/14/2019  01:03   CT HEAD CODE STROKE WO CONTRAST  Result Date: 11/14/2019 CLINICAL DATA:  Code stroke. EXAM: CT HEAD WITHOUT CONTRAST TECHNIQUE: Contiguous axial images were obtained from the base of the skull through the vertex without intravenous contrast. COMPARISON:  None. FINDINGS: Brain: Generalized age-related cerebral atrophy. Patchy hypodensities seen involving the periventricular and deep white matter both cerebral hemispheres most consistent with chronic small vessel ischemic disease. No acute intracranial hemorrhage. There is question of subtle early loss of gray-white matter differentiation involving the supra ganglionic posterior left frontal region (series 2, image 18, suspicious for possible early developing acute left MCA territory infarct. Area of involvement encompasses the left M5 distribution. Otherwise, gray-white matter differentiation maintained. No mass lesion or midline shift. No hydrocephalus. No extra-axial fluid collection. Vascular: No hyperdense vessel. Scattered vascular calcifications noted within the carotid siphons. Skull: Scalp soft tissues and calvarium within normal  limits. Calvarium intact. Sinuses/Orbits: Right gaze noted. Globes and orbital soft tissues demonstrate no other paranasal sinuses are clear. No mastoid effusion. Acute finding. Other: None. ASPECTS [facility] Stroke Program Early CT Score) - Ganglionic level infarction (caudate, lentiform nuclei, internal capsule, insula, M1-M3 cortex): 7 - Supraganglionic infarction (M4-M6 cortex): 2 Total score (0-10 with 10 being normal): 9 IMPRESSION: 1. Question subtle loss of gray-white matter differentiation involving the posterior left frontal region, suspicious for early/developing acute left MCA territory ischemia given provided symptoms. No hyperdense vessel or intracranial hemorrhage. 2. ASPECTS is 9. 3. Age-related cerebral atrophy with moderate chronic microvascular ischemic disease. Critical Value/emergent results were called by telephone at the time of interpretation on 11/13/2019 at 11:55 pm to provider [facility] , who verbally acknowledged these results. Electronically Signed   By: Jeannine Boga M.D.   On: 11/14/2019 00:02    ED COURSE and MDM  Nursing notes, initial and subsequent vitals signs, including pulse oximetry, reviewed and interpreted by myself.  Vitals:   11/14/19 0300 11/14/19 0315 11/14/19 0330 11/14/19 0400  BP: (!) 153/65 137/82 129/87 132/82  Pulse: 92 89 86 82  Resp: 19 18 17 19   Temp:      SpO2: 99% 95% 97% 96%  Weight:      Height:       Medications  alteplase (ACTIVASE) 1 mg/mL infusion 56.7 mg (0 mg Intravenous Stopped 11/14/19 0105)    Followed by  0.9 %  sodium chloride infusion (has no administration in time range)  labetalol (NORMODYNE) injection 20 mg (has no administration in time range)    And  nicardipine (CARDENE) 20mg  in 0.86% saline 241ml IV infusion (0.1 mg/ml) (has no administration in time range)  iohexol (OMNIPAQUE) 350 MG/ML injection 50 mL (50 mLs Intravenous Contrast Given 11/14/19 0032)  acetaminophen (TYLENOL) tablet 650 mg (650 mg Oral Given  11/14/19 0147)    11:26 PM Code stroke called after initial evaluation.  1:01 AM Dr. Vickki Muff, teleneurologist, evaluated the patient and discussed the pros and cons of TPA.  The patient consented to TPA and TPA was administered beginning at 12:05 AM.  Dr. Asa Lente also ordered labetalol and nicardipine PRN.  Patient still unable to move right upper extremity.  2:25 AM Dr. Lorraine Lax accepts for transfer to [facility] Stroke ICU. Awaiting transport.  3:01 AM Patient now has some ability to move right arm but cannot lift it against gravity.   PROCEDURES  Procedures  CRITICAL CARE Performed by: Karen Chafe Rahima Fleishman Total critical care time: 45 minutes Critical care time was exclusive of separately billable procedures and treating other  patients. Critical care was necessary to treat or prevent imminent or life-threatening deterioration. Critical care was time spent personally by me on the following activities: development of treatment plan with patient and/or surrogate as well as nursing, discussions with consultants, evaluation of patient's response to treatment, examination of patient, obtaining history from patient or surrogate, ordering and performing treatments and interventions, ordering and review of laboratory studies, ordering and review of radiographic studies, pulse oximetry and re-evaluation of patient's condition.   ED DIAGNOSES     ICD-10-CM   1. Cerebrovascular accident (CVA) due to occlusion of small artery (HCC)  I63.81   2. Received intravenous tissue plasminogen activator (tPA) in emergency department  Z92.82        Paula Libra, MD 11/14/19 [phone]

## 2019-11-13 NOTE — ED Triage Notes (Addendum)
Pt c/o after taking Melatonin x 2 hrs ago  she is unable to move her right arm and has a h/a . No slurred speech.

## 2019-11-14 ENCOUNTER — Emergency Department (HOSPITAL_BASED_OUTPATIENT_CLINIC_OR_DEPARTMENT_OTHER): Payer: Medicare Other

## 2019-11-14 ENCOUNTER — Inpatient Hospital Stay (HOSPITAL_COMMUNITY): Payer: Medicare Other

## 2019-11-14 DIAGNOSIS — R29705 NIHSS score 5: Secondary | ICD-10-CM | POA: Diagnosis not present

## 2019-11-14 DIAGNOSIS — N1832 Chronic kidney disease, stage 3b: Secondary | ICD-10-CM | POA: Diagnosis not present

## 2019-11-14 DIAGNOSIS — E041 Nontoxic single thyroid nodule: Secondary | ICD-10-CM | POA: Diagnosis not present

## 2019-11-14 DIAGNOSIS — E785 Hyperlipidemia, unspecified: Secondary | ICD-10-CM | POA: Diagnosis not present

## 2019-11-14 DIAGNOSIS — I371 Nonrheumatic pulmonary valve insufficiency: Secondary | ICD-10-CM | POA: Diagnosis not present

## 2019-11-14 DIAGNOSIS — Z79899 Other long term (current) drug therapy: Secondary | ICD-10-CM | POA: Diagnosis not present

## 2019-11-14 DIAGNOSIS — R002 Palpitations: Secondary | ICD-10-CM | POA: Diagnosis not present

## 2019-11-14 DIAGNOSIS — G8191 Hemiplegia, unspecified affecting right dominant side: Secondary | ICD-10-CM | POA: Diagnosis not present

## 2019-11-14 DIAGNOSIS — Z20822 Contact with and (suspected) exposure to covid-19: Secondary | ICD-10-CM | POA: Diagnosis not present

## 2019-11-14 DIAGNOSIS — J45909 Unspecified asthma, uncomplicated: Secondary | ICD-10-CM | POA: Diagnosis not present

## 2019-11-14 DIAGNOSIS — I63512 Cerebral infarction due to unspecified occlusion or stenosis of left middle cerebral artery: Secondary | ICD-10-CM | POA: Diagnosis not present

## 2019-11-14 DIAGNOSIS — Z9282 Status post administration of tPA (rtPA) in a different facility within the last 24 hours prior to admission to current facility: Secondary | ICD-10-CM | POA: Diagnosis not present

## 2019-11-14 DIAGNOSIS — I639 Cerebral infarction, unspecified: Secondary | ICD-10-CM

## 2019-11-14 DIAGNOSIS — I63412 Cerebral infarction due to embolism of left middle cerebral artery: Secondary | ICD-10-CM | POA: Diagnosis not present

## 2019-11-14 DIAGNOSIS — I6340 Cerebral infarction due to embolism of unspecified cerebral artery: Secondary | ICD-10-CM | POA: Diagnosis not present

## 2019-11-14 DIAGNOSIS — I129 Hypertensive chronic kidney disease with stage 1 through stage 4 chronic kidney disease, or unspecified chronic kidney disease: Secondary | ICD-10-CM | POA: Diagnosis not present

## 2019-11-14 HISTORY — DX: Cerebral infarction, unspecified: I63.9

## 2019-11-14 LAB — RAPID URINE DRUG SCREEN, HOSP PERFORMED
Amphetamines: NOT DETECTED
Barbiturates: NOT DETECTED
Benzodiazepines: NOT DETECTED
Cocaine: NOT DETECTED
Opiates: NOT DETECTED
Tetrahydrocannabinol: NOT DETECTED

## 2019-11-14 LAB — COMPREHENSIVE METABOLIC PANEL
ALT: 15 U/L (ref 0–44)
AST: 23 U/L (ref 15–41)
Albumin: 4.1 g/dL (ref 3.5–5.0)
Alkaline Phosphatase: 41 U/L (ref 38–126)
Anion gap: 10 (ref 5–15)
BUN: 28 mg/dL — ABNORMAL HIGH (ref 8–23)
CO2: 24 mmol/L (ref 22–32)
Calcium: 9.4 mg/dL (ref 8.9–10.3)
Chloride: 102 mmol/L (ref 98–111)
Creatinine, Ser: 1.49 mg/dL — ABNORMAL HIGH (ref 0.44–1.00)
GFR calc Af Amer: 40 mL/min — ABNORMAL LOW (ref >60)
GFR calc non Af Amer: 35 mL/min — ABNORMAL LOW (ref >60)
Glucose, Bld: 133 mg/dL — ABNORMAL HIGH (ref 70–99)
Potassium: 3.7 mmol/L (ref 3.5–5.1)
Sodium: 136 mmol/L (ref 135–145)
Total Bilirubin: 1.1 mg/dL (ref 0.3–1.2)
Total Protein: 7.3 g/dL (ref 6.5–8.1)

## 2019-11-14 LAB — URINALYSIS, ROUTINE W REFLEX MICROSCOPIC
Bilirubin Urine: NEGATIVE
Glucose, UA: NEGATIVE mg/dL
Hgb urine dipstick: NEGATIVE
Ketones, ur: NEGATIVE mg/dL
Leukocytes,Ua: NEGATIVE
Nitrite: NEGATIVE
Protein, ur: NEGATIVE mg/dL
Specific Gravity, Urine: 1.01 (ref 1.005–1.030)
pH: 5.5 (ref 5.0–8.0)

## 2019-11-14 LAB — HEMOGLOBIN A1C
Hgb A1c MFr Bld: 5.7 % — ABNORMAL HIGH (ref 4.8–5.6)
Mean Plasma Glucose: 116.89 mg/dL

## 2019-11-14 LAB — PROTIME-INR
INR: 0.9 (ref 0.8–1.2)
Prothrombin Time: 11.8 seconds (ref 11.4–15.2)

## 2019-11-14 LAB — MRSA PCR SCREENING: MRSA by PCR: NEGATIVE

## 2019-11-14 LAB — LIPID PANEL
Cholesterol: 235 mg/dL — ABNORMAL HIGH (ref 0–200)
HDL: 45 mg/dL (ref >40)
LDL Cholesterol: 156 mg/dL — ABNORMAL HIGH (ref 0–99)
Total CHOL/HDL Ratio: 5.2 RATIO
Triglycerides: 171 mg/dL — ABNORMAL HIGH (ref <150)
VLDL: 34 mg/dL (ref 0–40)

## 2019-11-14 LAB — ETHANOL: Alcohol, Ethyl (B): <10 mg/dL (ref <10)

## 2019-11-14 LAB — ECHOCARDIOGRAM COMPLETE
Height: 62 in
Weight: 2183.44 oz

## 2019-11-14 LAB — APTT: aPTT: 22 seconds — ABNORMAL LOW (ref 24–36)

## 2019-11-14 LAB — SARS CORONAVIRUS 2 BY RT PCR (HOSPITAL ORDER, PERFORMED IN ~~LOC~~ HOSPITAL LAB): SARS Coronavirus 2: NEGATIVE

## 2019-11-14 MED ORDER — STROKE: EARLY STAGES OF RECOVERY BOOK: Freq: Once | Status: DC

## 2019-11-14 MED ORDER — GABAPENTIN 400 MG PO CAPS
400.0000 mg | ORAL_CAPSULE | Freq: Two times a day (BID) | ORAL | Status: DC
  Administered 2019-11-14 – 2019-11-16 (×5): 400 mg via ORAL
  Filled 2019-11-14 (×5): qty 1

## 2019-11-14 MED ORDER — ORAL CARE MOUTH RINSE
15.0000 mL | Freq: Two times a day (BID) | OROMUCOSAL | Status: DC
  Administered 2019-11-14 – 2019-11-16 (×3): 15 mL via OROMUCOSAL

## 2019-11-14 MED ORDER — PANTOPRAZOLE SODIUM 40 MG IV SOLR
40.0000 mg | Freq: Every day | INTRAVENOUS | Status: DC
  Administered 2019-11-14: 40 mg via INTRAVENOUS
  Filled 2019-11-14: qty 40

## 2019-11-14 MED ORDER — IOHEXOL 350 MG/ML SOLN
50.0000 mL | Freq: Once | INTRAVENOUS | Status: AC | PRN
  Administered 2019-11-14: 50 mL via INTRAVENOUS

## 2019-11-14 MED ORDER — LIDOCAINE 5 % EX PTCH
1.0000 | MEDICATED_PATCH | CUTANEOUS | Status: DC
  Administered 2019-11-14 – 2019-11-16 (×2): 1 via TRANSDERMAL
  Filled 2019-11-14 (×2): qty 1

## 2019-11-14 MED ORDER — SENNOSIDES-DOCUSATE SODIUM 8.6-50 MG PO TABS: 1.0000 | ORAL_TABLET | Freq: Every evening | ORAL | Status: DC | PRN

## 2019-11-14 MED ORDER — ENSURE ENLIVE PO LIQD
237.0000 mL | Freq: Two times a day (BID) | ORAL | Status: DC
  Administered 2019-11-15 – 2019-11-16 (×3): 237 mL via ORAL

## 2019-11-14 MED ORDER — MELATONIN 3 MG PO TABS
3.0000 mg | ORAL_TABLET | Freq: Every day | ORAL | Status: DC
  Administered 2019-11-14 – 2019-11-15 (×2): 3 mg via ORAL
  Filled 2019-11-14 (×4): qty 1

## 2019-11-14 MED ORDER — ATORVASTATIN CALCIUM 10 MG PO TABS
20.0000 mg | ORAL_TABLET | Freq: Every day | ORAL | Status: DC
  Administered 2019-11-14: 20 mg via ORAL
  Filled 2019-11-14: qty 2

## 2019-11-14 MED ORDER — ACETAMINOPHEN 650 MG RE SUPP: 650.0000 mg | RECTAL | Status: DC | PRN

## 2019-11-14 MED ORDER — LABETALOL HCL 5 MG/ML IV SOLN: 20.0000 mg | Freq: Once | INTRAVENOUS | Status: DC

## 2019-11-14 MED ORDER — ACETAMINOPHEN 325 MG PO TABS
650.0000 mg | ORAL_TABLET | ORAL | Status: DC | PRN
  Administered 2019-11-14: 650 mg via ORAL
  Filled 2019-11-14 (×2): qty 2

## 2019-11-14 MED ORDER — CHLORHEXIDINE GLUCONATE CLOTH 2 % EX PADS
6.0000 | MEDICATED_PAD | Freq: Every day | CUTANEOUS | Status: DC
  Administered 2019-11-14: 09:00:00 6 via TOPICAL

## 2019-11-14 MED ORDER — ACETAMINOPHEN 325 MG PO TABS
650.0000 mg | ORAL_TABLET | Freq: Once | ORAL | Status: AC
  Administered 2019-11-14: 650 mg via ORAL
  Filled 2019-11-14: qty 2

## 2019-11-14 MED ORDER — CLEVIDIPINE BUTYRATE 0.5 MG/ML IV EMUL: 0.0000 mg/h | INTRAVENOUS | Status: DC

## 2019-11-14 MED ORDER — SODIUM CHLORIDE 0.9 % IV SOLN: INTRAVENOUS | Status: DC

## 2019-11-14 MED ORDER — ALBUTEROL SULFATE (2.5 MG/3ML) 0.083% IN NEBU: 2.5000 mg | INHALATION_SOLUTION | Freq: Four times a day (QID) | RESPIRATORY_TRACT | Status: DC | PRN

## 2019-11-14 MED ORDER — MUSCLE RUB 10-15 % EX CREA
TOPICAL_CREAM | CUTANEOUS | Status: DC | PRN
  Administered 2019-11-14: 1 via TOPICAL
  Filled 2019-11-14: qty 85

## 2019-11-14 MED ORDER — MELATONIN 5 MG PO TABS
5.0000 mg | ORAL_TABLET | Freq: Every day | ORAL | Status: DC
  Filled 2019-11-14: qty 1

## 2019-11-14 MED ORDER — ACETAMINOPHEN 160 MG/5ML PO SOLN: 650.0000 mg | ORAL | Status: DC | PRN

## 2019-11-14 NOTE — Evaluation (Signed)
Speech Language Pathology Evaluation Patient Details Name: Marquite Attwood MRN: [id number] DOB: October 06, 1946 Today's Date: 11/14/2019 Time: 1761-6073 SLP Time Calculation (min) (ACUTE ONLY): 14 min  Problem List:  Patient Active Problem List   Diagnosis Date Noted  . Stroke (HCC) 11/14/2019  . Acute ischemic stroke (HCC) 11/14/2019   Past Medical History:  Past Medical History:  Diagnosis Date  . Asthma   . Hypertension    Past Surgical History:  Past Surgical History:  Procedure Laterality Date  . HIP SURGERY    . NECK SURGERY     HPI:  Raschelle Wisenbaker is a 74 y.o. female past medical history of asthma and hypertension, presented to [facility] for sudden onset of right arm weakness and right facial weakness. TPA was administered early morning on 11/14/19.  Head CT on 11/14/19 was "suspicious for early/developing acute left MCA territory ischemia given provided symptoms." Awaiting MRI.    Assessment / Plan / Recommendation Clinical Impression  Pt was seen for a cognitive-linguistic evaluation in the setting of a possible L MCA territory ischemia.  Attempted to use [employer] interpreter for this evaluation; however, Filipino interpreter was not available at this time.  Pt stated that she was comfortable conducting the evaluation in English.  Pt reported that she recently moved to [place] and that she resides with her daughter.  She stated that her daughter assists her with some IADLs including medication and financial management.  Pt appears to have functional expressive and receptive language abilities and no dysarthria was observed.  Cognitive evaluation was limited secondary to language barrier; however, safety judgment, problem solving, attention, and short-term memory appeared functional.  Recommend diagnostic treatment to further evaluate cognitive-linguistic abilities when interpreter is available.      SLP Assessment  SLP Recommendation/Assessment: Patient needs  continued Speech Lanaguage Pathology Services SLP Visit Diagnosis: Cognitive communication deficit (R41.841)    Follow Up Recommendations  Other (comment)(TBD)    Frequency and Duration min 2x/week  2 weeks      SLP Evaluation Cognition  Overall Cognitive Status: Difficult to assess(secondary to language barrier ) Arousal/Alertness: Awake/alert Orientation Level: Oriented X4 Attention: Sustained Sustained Attention: Appears intact Memory: Appears intact Awareness: Appears intact Safety/Judgment: Appears intact       Comprehension  Auditory Comprehension Overall Auditory Comprehension: Appears within functional limits for tasks assessed Yes/No Questions: Within Functional Limits Commands: Within Functional Limits Conversation: Simple    Expression Expression Primary Mode of Expression: Verbal Verbal Expression Overall Verbal Expression: Appears within functional limits for tasks assessed Initiation: No impairment Level of Generative/Spontaneous Verbalization: Conversation Repetition: No impairment Naming: No impairment Pragmatics: No impairment   Oral / Motor  Oral Motor/Sensory Function Overall Oral Motor/Sensory Function: Generalized oral weakness Facial ROM: Within Functional Limits Facial Symmetry: Within Functional Limits Lingual ROM: Reduced right;Reduced left Lingual Symmetry: Within Functional Limits Motor Speech Overall Motor Speech: Appears within functional limits for tasks assessed                      Villa Herb M.S., CCC-SLP Acute Rehabilitation Services Office: [phone]  Shanon Rosser Gianno Volner 11/14/2019, 10:32 AM

## 2019-11-14 NOTE — Consult Note (Addendum)
[facility] TeleNeurology Consult Services   Date of Service:   11/13/2019 23:26:48  Impression:     .  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 No contraindications for IV alteplase. Reviewed with patient and daughter, alteplase given at 00:05. CTAs pending.  Metrics: Last Known Well: 11/13/2019 21:15:00 [facility] Notification Time: 11/13/2019 23:25:37 Arrival Time: 11/13/2019 23:00:00 Stamp Time: 11/13/2019 23:26:48 Time First Login Attempt: 11/13/2019 23:37:26 Symptoms: right arm weakness NIHSS Start Assessment Time: 11/13/2019 23:42:05 Alteplase Early Mix Decision Time: 11/13/2019 23:40:00 Patient is a candidate for Alteplase/Activase. Alteplase Medical Decision: 11/13/2019 23:55:01 Alteplase/Activase CPOE Order Time: 11/13/2019 23:53:51 Needle Time: 11/14/2019 00:05:00 Weight Noted by Staff: 63 kg Reason for Alteplase/Activase Delay: Other descriptives  CT head was reviewed.  Lower Likelihood of Large Vessel Occlusion but Following Stat Studies are Recommended  CTA Head and Neck- pending, will review.    ED Physician notified of diagnostic impression and management plan on 11/14/2019 00:01:48  Alteplase/Activase Contraindications:  Last Known Well > 4.5 hours: No CT Head showing hemorrhage: No Ischemic stroke within 3 months: No Severe head trauma within 3 months: No Intracranial/intraspinal surgery within 3 months: No History of intracranial hemorrhage: No Symptoms and signs consistent with an SAH: No GI malignancy or GI bleed within 21 days: No Coagulopathy: Platelets <100 000/mm3, INR >1.7, aPTT>40 s, or PT >15 s: No Treatment dose of LMWH within the previous 24 hrs: No Use of NOACs in past 48 hours: No Glycoprotein IIb/IIIa receptor inhibitors use: No Symptoms consistent with infective endocarditis:  No Suspected aortic arch dissection: No Intra-axial intracranial neoplasm: No  Verbal Consent to Alteplase/Activase: I have explained to the Patient and Family the nature of the patient's condition, reviewed the indications and contraindications to the use of Alteplase/Activase fibrinolytic agent, reviewed the indications and contraindications and the benefits to be reasonably expected compared with alternative approaches. I have discussed the likelihood of major risks or complications of this procedure including (if applicable) but not limited to loss of limb function, brain damage, paralysis, hemorrhage, infection, complications from transfusion of blood components, drug reactions, blood clots and loss of life. I have also indicated that with any procedure there is always the possibility of an unexpected complication. All questions were answered and Patient and Family express understanding of the treatment plan and consent to the treatment.  Our recommendations are outlined below.  Recommendations: IV Alteplase/Activase recommended.  Alteplase/Activase bolus given Without Complication.   IV Alteplase/Activase Total Dose - 56.7 mg IV Alteplase/Activase Bolus Dose - 5.7 mg IV Alteplase/Activase Infusion Dose - 51.0 mg  Routine post Alteplase/Activase monitoring including neuro checks and blood pressure control during/after treatment Monitor blood pressure Check blood pressure and NIHSS every 15 min for 2 h, then every 30 min for 6 h, and finally every hour for 16 h.  Manage Blood Pressure per post Alteplase/Activase protocol.      .  Admission to ICU     .  CT brain 24 hours post Alteplase/Activase     .  NPO until swallowing screen performed and passed     .  No antiplatelet agents or anticoagulants (including heparin for DVT prophylaxis) in first 24 hours     .  No Foley catheter, nasogastric tube, arterial catheter or central venous catheter for 24 hr, unless absolutely necessary      .  Telemetry     .  Bedside swallow evaluation     .  HOB less than 30 degrees     .  Euglycemia     .  Avoid hyperthermia, PRN acetaminophen     .  DVT prophylaxis     .  Inpatient Neurology Consultation     .  Stroke evaluation as per inpatient neurology recommendations  Discussed with ED physician    ------------------------------------------------------------------------------  History of Present Illness: Patient is a 74 year old Female.  Patient was brought by private transportation with symptoms of right arm weakness  Pt is a [place] female. She just moved from [place] in October. She took melatonin for the first time tonight. On waking up noted to have right sided weakness. She has a history of spinal stenosis and chronic right sided numbness right leg, cervical spinal surgery (> 4 months ago). No history of recent surgery or bleeding issues.  Last seen normal was within 4.5 hours. There is no history of hemorrhagic complications or intracranial hemorrhage. There is no history of Recent Anticoagulants. There is no history of recent major surgery. There is no history of recent stroke.  Past Medical History:     . There is NO history of Hypertension     . There is NO history of Diabetes Mellitus     . There is NO history of Hyperlipidemia     . There is NO history of Atrial Fibrillation     . There is NO history of Coronary Artery Disease     . There is NO history of Stroke  Anticoagulant use:  No  Antiplatelet use: No   Examination: BP(154/103), Pulse(105), Blood Glucose(119) 1A: Level of Consciousness - Alert; keenly responsive + 0 1B: Ask Month and Age - Both Questions Right + 0 1C: Blink Eyes & Squeeze Hands - Performs Both Tasks + 0 2: Test Horizontal Extraocular Movements - Normal + 0 3: Test Visual Fields - No Visual Loss + 0 4: Test Facial Palsy (Use Grimace if Obtunded) - Minor paralysis (flat nasolabial fold, smile asymmetry) + 1 5A: Test Left Arm  Motor Drift - No Drift for 10 Seconds + 0 5B: Test Right Arm Motor Drift - No Effort Against Gravity + 3 6A: Test Left Leg Motor Drift - No Drift for 5 Seconds + 0 6B: Test Right Leg Motor Drift - No Drift for 5 Seconds + 0 7: Test Limb Ataxia (FNF/Heel-Shin) - No Ataxia + 0 8: Test Sensation - Normal; No sensory loss + 0 9: Test Language/Aphasia - Normal; No aphasia + 0 10: Test Dysarthria - Normal + 0 11: Test Extinction/Inattention - No abnormality + 0  NIHSS Score: 4  Pre-Morbid Modified Ranking Scale: 0 Points = No symptoms at all  Patient/Family was informed the Neurology Consult would occur via TeleHealth consult by way of interactive audio and video telecommunications and consented to receiving care in this manner.   Due to the immediate potential for life-threatening deterioration due to underlying acute neurologic illness, I spent 40 minutes providing critical care. This time includes time for face to face visit via telemedicine, review of medical records, imaging studies and discussion of findings with providers, the patient and/or family.   *Addendum: no LVO on CTA. Reviewed with ER MD.   Dr Rutherford Guys   [facility] [phone]  Case [id number]

## 2019-11-14 NOTE — Progress Notes (Signed)
  Echocardiogram 2D Echocardiogram has been performed.  Denise Leon 11/14/2019, 12:09 PM

## 2019-11-14 NOTE — Evaluation (Addendum)
Clinical/Bedside Swallow Evaluation Patient Details  Name: Denise Leon MRN: [id number] Date of Birth: 07/23/1946  Today's Date: 11/14/2019 Time: SLP Start Time (ACUTE ONLY): 0944 SLP Stop Time (ACUTE ONLY): 0958 SLP Time Calculation (min) (ACUTE ONLY): 14 min  Past Medical History:  Past Medical History:  Diagnosis Date  . Asthma   . Hypertension    Past Surgical History:  Past Surgical History:  Procedure Laterality Date  . HIP SURGERY    . NECK SURGERY     HPI:  Denise Leon is a 74 y.o. female past medical history of asthma and hypertension, presented to [facility] for sudden onset of right arm weakness and right facial weakness. TPA was administered early morning on 11/14/19.  Head CT on 11/14/19 was "suspicious for early/developing acute left MCA territory ischemia given provided symptoms." Awaiting MRI.   Assessment / Plan / Recommendation Clinical Impression  Pt was seen for a bedside swallow evaluation and she presents with mild oral dysphagia with possible pharyngeal dysphagia.  Attempted to use [employer] interpreter for this evaluation; however, Filipino interpreter was not available at this time.  Pt stated that she was comfortable conducting the evaluation in English.  Pt was observed to have top and bottom dentures in place.  Pt failed the [employer] Protocol just prior to this evaluation; however, it was repeated during this evaluation and she did not exhibit any s/sx of aspiration.  She exhibited prolonged mastication of regular solids and an immediate cough was observed following 1/8 trials of regular solids.  AP transport was timely with puree trials and no oral residue was observed following any solid trials.  Recommend initiation of Dysphagia 3 (mech soft) solids and thin liquids with medications administered whole in puree.  Due to bilateral upper extremity weakness, pt will benefit from assistance during meals to help with self-feeding and to cue for the  following compensatory strategies: 1) Small bites/sips 2) Slow rate of intake 3) Sit upright as possible.  SLP will f/u to monitor diet tolerance.   SLP Visit Diagnosis: Cognitive communication deficit (R41.841)    Aspiration Risk  Mild aspiration risk    Diet Recommendation Dysphagia 3 (Mech soft);Thin liquid   Liquid Administration via: Cup;Straw Medication Administration: Whole meds with puree Supervision: Staff to assist with self feeding;Intermittent supervision to cue for compensatory strategies Compensations: Slow rate;Small sips/bites Postural Changes: Seated upright at 90 degrees    Other  Recommendations Oral Care Recommendations: Oral care BID   Follow up Recommendations Other (comment)(TBD)      Frequency and Duration min 2x/week  2 weeks       Prognosis Prognosis for Safe Diet Advancement: Good      Swallow Study   General Date of Onset: 11/14/19 HPI: Denise Leon is a 74 y.o. female past medical history of asthma and hypertension, presented to [facility] for sudden onset of right arm weakness and right facial weakness. TPA was administered early morning on 11/14/19.  Head CT on 11/14/19 was "suspicious for early/developing acute left MCA territory ischemia given provided symptoms." Awaiting MRI.  Type of Study: Bedside Swallow Evaluation Previous Swallow Assessment: None  Diet Prior to this Study: NPO Temperature Spikes Noted: No Respiratory Status: Room air History of Recent Intubation: No Behavior/Cognition: Alert;Cooperative Oral Cavity Assessment: Within Functional Limits Oral Care Completed by SLP: No Oral Cavity - Dentition: Dentures, top;Dentures, bottom Vision: Functional for self-feeding Self-Feeding Abilities: Needs assist Patient Positioning: Upright in bed Baseline Vocal Quality: Normal Volitional  Cough: Strong Volitional Swallow: Able to elicit    Oral/Motor/Sensory Function Overall Oral Motor/Sensory Function: Generalized oral  weakness Facial ROM: Within Functional Limits Facial Symmetry: Within Functional Limits Lingual ROM: Reduced right;Reduced left Lingual Symmetry: Within Functional Limits   Ice Chips Ice chips: Within functional limits Presentation: Spoon   Thin Liquid Thin Liquid: Within functional limits Presentation: Spoon;Straw    Nectar Thick Nectar Thick Liquid: Not tested   Honey Thick Honey Thick Liquid: Not tested   Puree Puree: Within functional limits Presentation: Spoon   Solid     Solid: Impaired Presentation: Spoon Oral Phase Impairments: Impaired mastication Oral Phase Functional Implications: Prolonged oral transit;Impaired mastication Pharyngeal Phase Impairments: Cough - Immediate     Villa Herb M.S., CCC-SLP Acute Rehabilitation Services Office: [phone]  Shanon Rosser Riaz Onorato 11/14/2019,10:36 AM

## 2019-11-14 NOTE — H&P (Signed)
Denise Leon Stroke Neurology history and physical Status post TPA at outside facility-[facility]   CC: Right leg weakness  History is obtained from: Patient, chart  HPI: Denise Leon is a 74 y.o. female past medical history of asthma and hypertension, presented to [facility] for sudden onset of right arm weakness and right facial weakness. She was last known normal approximately 9 PM on 11/13/2019, when she took melatonin and went to bed.  She then developed a headache which she described 5 out of 10 to the ED provider over at [facility] followed by inability to move the right upper extremity.  She moved from [place] to live with her daughter here and has been living here since [place] daughter was accompanying her at the outside ER and provided some history to the ED providers as well as the telemedicine neurology consultant. She was evaluated by telemedicine neurology, deemed to be a candidate for IV TPA for right-sided weakness, TPA was administered and she was transferred to [facility] this morning after 8:15 a.m.  Has a prior history of spinal surgery with some right-sided numbness but no prior history of weakness.   LKW: 9 PM 11/13/2019 tpa given?:  Given by telemedicine neurology-00:05 hours 11/14/2019 Premorbid modified Rankin scale (mRS):0  ROS: Obtained and pertinent positives noted in the HPI.  Otherwise negative.  Past Medical History:  Diagnosis Date  . Asthma   . Hypertension     No family history on file. No family history of stroke  Social History:   reports that she has never smoked. She has never used smokeless tobacco. She reports that she does not drink alcohol or use drugs.  Medications  Current Facility-Administered Medications:  .   stroke: mapping our early stages of recovery book, , Does not apply, Once, Milon Dikes, MD .  [COMPLETED] alteplase (ACTIVASE) 1 mg/mL infusion 56.7 mg, 0.9 mg/kg,  Intravenous, Once, Stopped at 11/14/19 0105 **FOLLOWED BY** 0.9 %  sodium chloride infusion, 50 mL, Intravenous, Once, Rutherford Guys V, MD .  0.9 %  sodium chloride infusion, , Intravenous, Continuous, Milon Dikes, MD .  acetaminophen (TYLENOL) tablet 650 mg, 650 mg, Oral, Q4H PRN **OR** acetaminophen (TYLENOL) 160 MG/5ML solution 650 mg, 650 mg, Per Tube, Q4H PRN **OR** acetaminophen (TYLENOL) suppository 650 mg, 650 mg, Rectal, Q4H PRN, Milon Dikes, MD .  Chlorhexidine Gluconate Cloth 2 % PADS 6 each, 6 each, Topical, Daily, Milon Dikes, MD .  labetalol (NORMODYNE) injection 20 mg, 20 mg, Intravenous, Once **AND** clevidipine (CLEVIPREX) infusion 0.5 mg/mL, 0-21 mg/hr, Intravenous, Continuous, Milon Dikes, MD .  labetalol (NORMODYNE) injection 20 mg, 20 mg, Intravenous, Once **AND** nicardipine (CARDENE) 20mg  in 0.86% saline IV infusion (0.1 mg/ml), 0-15 mg/hr, Intravenous, Continuous, , Mariecken V, MD .  pantoprazole (PROTONIX) injection 40 mg, 40 mg, Intravenous, QHS, Ether Griffins, MD .  senna-docusate (Senokot-S) tablet 1 tablet, 1 tablet, Oral, QHS PRN, Milon Dikes, MD   Exam: Current vital signs: BP (!) 110/59   Pulse 72   Temp 98.1 F (36.7 C)   Resp 19   Ht 5\' 2"  (1.575 m)   Wt 63 kg   SpO2 95%   BMI 25.40 kg/m  Vital signs in last 24 hours: Temp:  [98.1 F (36.7 C)-98.5 F (36.9 C)] 98.1 F (36.7 C) (01/30 0046) Pulse Rate:  [71-105] 72 (01/30 0730) Resp:  [14-21] 19 (01/30 0730) BP: (100-154)/(59-103) 110/59 (01/30 0730) SpO2:  [95 %-100 %] 95 % (  01/30 0730) Weight:  [62 kg] 63 kg (01/29 2304) General: Awake alert in no distress HEENT: Normocephalic atraumatic Lungs: Clear Cardiovascular: Regular rhythm Abdomen: Nondistended nontender Extremities warm well perfused Neurological exam Mental status: Awake alert oriented x3 Language: She is predominantly [place] speaking, but can speak and converse in [place] normally.  She has no  dysarthria. No evidence of aphasia She is able to follow all commands consistently Cranial nerves: Pupils equal round react light, extraocular movements intact, visual fields full, right lower facial weakness, sensation intact on the face, tongue and palate midline. Motor exam: Flaccid right upper extremity 1/5.  4+/5 right lower extremity.  Left side 5/5 without vertical drift. Sensory exam: Diminished to light touch on the right. Coordination: No ataxia on the left.  Unable to perform on the right. Gait testing deferred at this time NIH stroke scale 1a Level of Conscious.: 0 1b LOC Questions: 0 1c LOC Commands: 0 2 Best Gaze: 0 3 Visual: 0 4 Facial Palsy: 1 5a Motor Arm - left: 0 5b Motor Arm - Right: 3 6a Motor Leg - Left: 0 6b Motor Leg - Right: 0 7 Limb Ataxia: 0 8 Sensory: 1 9 Best Language: 0 10 Dysarthria: 0 11 Extinct. and Inatten.: 0 TOTAL: 5   Labs I have reviewed labs in epic and the results pertinent to this consultation are:  CBC    Component Value Date/Time   WBC 11.2 (H) 11/13/2019 2323   RBC 3.75 (L) 11/13/2019 2323   HGB 11.7 (L) 11/13/2019 2323   HCT 34.6 (L) 11/13/2019 2323   PLT 318 11/13/2019 2323   MCV 92.3 11/13/2019 2323   MCH 31.2 11/13/2019 2323   MCHC 33.8 11/13/2019 2323   RDW 12.0 11/13/2019 2323   LYMPHSABS 3.9 11/13/2019 2323   MONOABS 1.0 11/13/2019 2323   EOSABS 0.2 11/13/2019 2323   BASOSABS 0.0 11/13/2019 2323    CMP     Component Value Date/Time   NA 136 11/13/2019 2323   K 3.7 11/13/2019 2323   CL 102 11/13/2019 2323   CO2 24 11/13/2019 2323   GLUCOSE 133 (H) 11/13/2019 2323   BUN 28 (H) 11/13/2019 2323   CREATININE 1.49 (H) 11/13/2019 2323   CALCIUM 9.4 11/13/2019 2323   PROT 7.3 11/13/2019 2323   ALBUMIN 4.1 11/13/2019 2323   AST 23 11/13/2019 2323   ALT 15 11/13/2019 2323   ALKPHOS 41 11/13/2019 2323   BILITOT 1.1 11/13/2019 2323   GFRNONAA 35 (L) 11/13/2019 2323   GFRAA 40 (L) 11/13/2019 2323   Imaging I  have reviewed the images obtained:  CT-scan of the brain-no bleed  CT angiography of head and neck: IMPRESSION: 1. Negative CTA for large vessel occlusion. 2. Mixed atheromatous plaque at the origin of the left ICA with short-segment mild 30% stenosis. 3. Moderate atherosclerotic change about the aortic arch, with more mild atherosclerotic change about the carotid siphons. No other hemodynamically significant or correctable stenosis identified. 4. 19 mm left thyroid nodule, indeterminate. Further evaluation with dedicated thyroid ultrasound recommended. This could be performed on a nonemergent outpatient basis.   Assessment: 74 year old woman with past medical history of hypertension, presenting with sudden onset of right-sided upper extremity plegia and lower extremity paresis on the right.  Noted to have objective right-sided deficits including right lower facial weakness, plegic right upper extremity and paretic right lower extremity.  IV TPA given at the outside facility at 12:05 a.m. on 11/14/2019 Transfer to [facility] for further management and higher  level of care post IV TPA administration.  Impression:  Acute ischemic stroke-likely small vessel etiology CKD stage 3   Recommendations: Admit to ICU Neurochecks and vitals per post TPA protocol ordered No aspirin or antiplatelet/anticoagulants until 24-hour imaging negative for bleed. Blood pressure goal-systolic blood pressure less than 180.  Use Cleviprex drip or labetalol as needed. MRI brain without contrast 2D echocardiogram A1c, lipid panel Physical therapy, Occupational Therapy, speech therapy. N.p.o. until clears bedside stroke swallow evaluation or formal swallow evaluation. IV fluids-normal saline at 75 cc an hour Fingerstick glucose monitoring. Chronic pain-lidocaine patch at baseline for right hip.  Continue per home schedule q24h.  Present on admission -Acute ischemic stroke -Hemiplegia and  hemiparesis -CKD stage III  -- Milon Dikes, MD [facility] Pager: [phone] If 7pm to 7am, please call on call as listed on AMION.   CRITICAL CARE ATTESTATION Performed by: Milon Dikes, MD Total critical care time: 40 minutes Critical care time was exclusive of separately billable procedures and treating other patients and/or supervising APPs/Residents/Students Critical care was necessary to treat or prevent imminent or life-threatening deterioration due to acute ischemic stroke. S/p IV tPA  This patient is critically ill and at significant risk for neurological worsening and/or death and care requires constant monitoring. Critical care was time spent personally by me on the following activities: development of treatment plan with patient and/or surrogate as well as nursing, discussions with consultants, evaluation of patient's response to treatment, examination of patient, obtaining history from patient or surrogate, ordering and performing treatments and interventions, ordering and review of laboratory studies, ordering and review of radiographic studies, pulse oximetry, re-evaluation of patient's condition, participation in multidisciplinary rounds and medical decision making of high complexity in the care of this patient.

## 2019-11-14 NOTE — Progress Notes (Signed)
Patient to [facility]N15 at [phone] a/ox4 with neuro assessment as charted. Belongings with patient on arrival: blanket, 2 jackets, 2 pairs of pants, one shirt, on flannel shirt, crackers, and a [employer] account planner.  Aris Lot, RN

## 2019-11-14 NOTE — Progress Notes (Signed)
PT Cancellation Note  Patient Details Name: Denise Leon MRN: [id number] DOB: 13-Aug-1946   Cancelled Treatment:    Reason Eval/Treat Not Completed: Active bedrest order     Lillia Pauls, PT, DPT Acute Rehabilitation Services Pager [phone] Office [phone]    Norval Morton 11/14/2019, 10:09 AM

## 2019-11-14 NOTE — ED Notes (Signed)
Called [facility] to inform of transport. Spoke with Diplomatic Services operational officer.

## 2019-11-15 DIAGNOSIS — R002 Palpitations: Secondary | ICD-10-CM

## 2019-11-15 DIAGNOSIS — I63412 Cerebral infarction due to embolism of left middle cerebral artery: Secondary | ICD-10-CM | POA: Diagnosis not present

## 2019-11-15 DIAGNOSIS — I10 Essential (primary) hypertension: Secondary | ICD-10-CM

## 2019-11-15 DIAGNOSIS — Z9282 Status post administration of tPA (rtPA) in a different facility within the last 24 hours prior to admission to current facility: Secondary | ICD-10-CM | POA: Diagnosis not present

## 2019-11-15 DIAGNOSIS — E782 Mixed hyperlipidemia: Secondary | ICD-10-CM

## 2019-11-15 DIAGNOSIS — I6340 Cerebral infarction due to embolism of unspecified cerebral artery: Secondary | ICD-10-CM | POA: Diagnosis not present

## 2019-11-15 DIAGNOSIS — I639 Cerebral infarction, unspecified: Secondary | ICD-10-CM | POA: Diagnosis not present

## 2019-11-15 LAB — BASIC METABOLIC PANEL
Anion gap: 6 (ref 5–15)
BUN: 22 mg/dL (ref 8–23)
CO2: 26 mmol/L (ref 22–32)
Calcium: 8.2 mg/dL — ABNORMAL LOW (ref 8.9–10.3)
Chloride: 108 mmol/L (ref 98–111)
Creatinine, Ser: 1.48 mg/dL — ABNORMAL HIGH (ref 0.44–1.00)
GFR calc Af Amer: 41 mL/min — ABNORMAL LOW (ref >60)
GFR calc non Af Amer: 35 mL/min — ABNORMAL LOW (ref >60)
Glucose, Bld: 100 mg/dL — ABNORMAL HIGH (ref 70–99)
Potassium: 4 mmol/L (ref 3.5–5.1)
Sodium: 140 mmol/L (ref 135–145)

## 2019-11-15 LAB — CBC
HCT: 31.2 % — ABNORMAL LOW (ref 36.0–46.0)
Hemoglobin: 10.1 g/dL — ABNORMAL LOW (ref 12.0–15.0)
MCH: 31 pg (ref 26.0–34.0)
MCHC: 32.4 g/dL (ref 30.0–36.0)
MCV: 95.7 fL (ref 80.0–100.0)
Platelets: 286 10*3/uL (ref 150–400)
RBC: 3.26 MIL/uL — ABNORMAL LOW (ref 3.87–5.11)
RDW: 12.3 % (ref 11.5–15.5)
WBC: 7 10*3/uL (ref 4.0–10.5)
nRBC: 0 % (ref 0.0–0.2)

## 2019-11-15 MED ORDER — ATORVASTATIN CALCIUM 40 MG PO TABS
40.0000 mg | ORAL_TABLET | Freq: Every day | ORAL | Status: DC
  Administered 2019-11-15: 40 mg via ORAL
  Filled 2019-11-15: qty 1

## 2019-11-15 MED ORDER — METOPROLOL SUCCINATE ER 25 MG PO TB24
25.0000 mg | ORAL_TABLET | Freq: Every day | ORAL | Status: DC
  Administered 2019-11-15 – 2019-11-16 (×2): 25 mg via ORAL
  Filled 2019-11-15 (×2): qty 1

## 2019-11-15 MED ORDER — LATANOPROST 0.005 % OP SOLN
1.0000 [drp] | Freq: Every day | OPHTHALMIC | Status: DC
  Administered 2019-11-15: 21:00:00 1 [drp] via OPHTHALMIC
  Filled 2019-11-15: qty 2.5

## 2019-11-15 MED ORDER — PANTOPRAZOLE SODIUM 40 MG PO TBEC
40.0000 mg | DELAYED_RELEASE_TABLET | Freq: Every day | ORAL | Status: DC
  Administered 2019-11-15 – 2019-11-16 (×2): 40 mg via ORAL
  Filled 2019-11-15 (×2): qty 1

## 2019-11-15 MED ORDER — ENOXAPARIN SODIUM 30 MG/0.3ML ~~LOC~~ SOLN
30.0000 mg | SUBCUTANEOUS | Status: DC
  Administered 2019-11-15 – 2019-11-16 (×2): 30 mg via SUBCUTANEOUS
  Filled 2019-11-15 (×2): qty 0.3

## 2019-11-15 MED ORDER — CLOPIDOGREL BISULFATE 75 MG PO TABS
75.0000 mg | ORAL_TABLET | Freq: Every day | ORAL | Status: DC
  Administered 2019-11-15 – 2019-11-16 (×2): 75 mg via ORAL
  Filled 2019-11-15 (×2): qty 1

## 2019-11-15 MED ORDER — ATORVASTATIN CALCIUM 80 MG PO TABS
80.0000 mg | ORAL_TABLET | Freq: Every day | ORAL | Status: DC
  Administered 2019-11-16: 80 mg via ORAL
  Filled 2019-11-15: qty 1

## 2019-11-15 MED ORDER — ENOXAPARIN SODIUM 40 MG/0.4ML ~~LOC~~ SOLN: 40.0000 mg | SUBCUTANEOUS | Status: DC

## 2019-11-15 MED ORDER — ASPIRIN EC 325 MG PO TBEC
325.0000 mg | DELAYED_RELEASE_TABLET | Freq: Every day | ORAL | Status: DC
  Administered 2019-11-16: 09:00:00 325 mg via ORAL
  Filled 2019-11-15: qty 1

## 2019-11-15 MED ORDER — SODIUM CHLORIDE 0.9 % IV SOLN: INTRAVENOUS | Status: DC

## 2019-11-15 MED ORDER — ASPIRIN EC 81 MG PO TBEC
81.0000 mg | DELAYED_RELEASE_TABLET | Freq: Every day | ORAL | Status: DC
  Administered 2019-11-15: 10:00:00 81 mg via ORAL
  Filled 2019-11-15: qty 1

## 2019-11-15 NOTE — Evaluation (Signed)
Occupational Therapy Evaluation Patient Details Name: Denise Leon MRN: [id number] DOB: Dec 09, 1945 Today's Date: 11/15/2019    History of Present Illness 74 y.o. female past medical history of asthma and HTN who presented to [facility] for sudden onset of right arm weakness and right facial weakness; TPA was administered.  Head CT suspicious for early/developing acute left MCA territory ischemia. MRI revealed patchy small volume acute ischemic infarct involving the posterior left frontal region, with involvement of the pre and postcentral gyri. No associated hemorrhage or mass effect.   Clinical Impression   This 74 y/o female presents with the above. PTA pt reports independence with ADL and mobility, living with her daughter. Pt now presenting with flaccid RUE (dominant UE), decreased sitting/standing balance impacting her functional performance. Pt currently requiring two person assist (HHA) for functional transfers and completion of short distance mobility in room. She currently requires mod-maxA (+2) for UB, LB and toileting ADL,, pt with episode of incontinence with mobility during this session. She is overall able to answer simple questions and follow basic commands as stratus unavailable during time of eval. Pt will benefit from continued acute OT services and feel she will benefit from CIR level therapy services at time of discharge to maximize her safety and independence with ADL and mobility. Will follow.     Follow Up Recommendations  CIR;Supervision/Assistance - 24 hour    Equipment Recommendations  Other (comment)(TBD)    Recommendations for Other Services Rehab consult     Precautions / Restrictions Precautions Precautions: Fall Precaution Comments: flaccid RUE Restrictions Weight Bearing Restrictions: No      Mobility Bed Mobility Overal bed mobility: Needs Assistance Bed Mobility: Supine to Sit     Supine to sit: HOB elevated;Min assist;Mod assist      General bed mobility comments: VCs for technique and to initiate movements, assist for trunk elevation, increased time/effort  Transfers Overall transfer level: Needs assistance Equipment used: 2 person hand held assist Transfers: Sit to/from Stand Sit to Stand: +2 physical assistance;+2 safety/equipment;Mod assist;Min assist         General transfer comment: assist to rise and steady in standing, requires RUE support     Balance Overall balance assessment: Needs assistance Sitting-balance support: Feet supported Sitting balance-Leahy Scale: Fair     Standing balance support: Single extremity supported;Bilateral upper extremity supported Standing balance-Leahy Scale: Poor Standing balance comment: reliant on external assist                           ADL either performed or assessed with clinical judgement   ADL Overall ADL's : Needs assistance/impaired Eating/Feeding: Set up;Minimal assistance;Sitting   Grooming: Minimal assistance;Sitting   Upper Body Bathing: Moderate assistance;Sitting   Lower Body Bathing: Moderate assistance;+2 for physical assistance;+2 for safety/equipment;Sit to/from stand   Upper Body Dressing : Moderate assistance;Sitting   Lower Body Dressing: Maximal assistance;+2 for physical assistance;+2 for safety/equipment;Sit to/from stand Lower Body Dressing Details (indicate cue type and reason): donning new socks and mesh underwear Toilet Transfer: Moderate assistance;+2 for physical assistance;+2 for safety/equipment;Ambulation;BSC Toilet Transfer Details (indicate cue type and reason): mobility with HHA, use of BSC in room due to urgency  Toileting- Clothing Manipulation and Hygiene: Maximal assistance;+2 for physical assistance;+2 for safety/equipment;Sit to/from stand Toileting - Clothing Manipulation Details (indicate cue type and reason): RN assisting with pericare in standing while therapists assist with standing balance      Functional mobility during ADLs: Moderate assistance;+2 for  physical assistance;+2 for safety/equipment(HHA) General ADL Comments: pt with decreased sitting/standing balance, RUE deficits, ?impaired cognition (vs language barrier)     Vision         Perception     Praxis      Pertinent Vitals/Pain Pain Assessment: Faces Faces Pain Scale: Hurts a little bit Pain Location: RUE at IV site Pain Descriptors / Indicators: Discomfort Pain Intervention(s): Limited activity within patient's tolerance;Monitored during session;Repositioned     Hand Dominance Right   Extremity/Trunk Assessment Upper Extremity Assessment Upper Extremity Assessment: RUE deficits/detail RUE Deficits / Details: overall flaccid UE, pt with minimal shoulder shrug noted, difficult to fully assess sensation but appears intact RUE Coordination: decreased fine motor;decreased gross motor   Lower Extremity Assessment Lower Extremity Assessment: Defer to PT evaluation       Communication     Cognition Arousal/Alertness: Awake/alert Behavior During Therapy: WFL for tasks assessed/performed;Flat affect Overall Cognitive Status: Difficult to assess                                 General Comments: pt following basic commands given increased time, intermittently requires rewording of questions, A&Ox4   General Comments       Exercises Exercises: Other exercises Other Exercises Other Exercises: educated in general self-ROM to R hand/wrist - will benefit from continued education and review   Shoulder Instructions      Home Living Family/patient expects to be discharged to:: Private residence Living Arrangements: Children(daughter) Available Help at Discharge: Family;Available PRN/intermittently(reports daughter works) Type of Home: House Home Access: Level entry(?per pt)     Home Layout: Two level;Able to live on main level with bedroom/bathroom                   Additional  Comments: home setup obtained from pt however pt with difficulty answering some questions given some language barrier      Prior Functioning/Environment Level of Independence: Independent        Comments: daughter assisting some with iADL        OT Problem List: Decreased strength;Decreased range of motion;Decreased activity tolerance;Impaired balance (sitting and/or standing);Decreased cognition;Decreased coordination;Decreased safety awareness;Decreased knowledge of precautions;Decreased knowledge of use of DME or AE;Impaired UE functional use;Impaired sensation      OT Treatment/Interventions: Self-care/ADL training;Therapeutic exercise;Energy conservation;DME and/or AE instruction;Therapeutic activities;Patient/family education;Balance training;Cognitive remediation/compensation;Neuromuscular education    OT Goals(Current goals can be found in the care plan section) Acute Rehab OT Goals Patient Stated Goal: to use her right arm again OT Goal Formulation: With patient Time For Goal Achievement: 11/29/19 Potential to Achieve Goals: Good  OT Frequency: Min 2X/week   Barriers to D/C:            Co-evaluation PT/OT/SLP Co-Evaluation/Treatment: Yes Reason for Co-Treatment: For patient/therapist safety;To address functional/ADL transfers   OT goals addressed during session: ADL's and self-care      AM-PAC OT "6 Clicks" Daily Activity     Outcome Measure Help from another person eating meals?: A Little Help from another person taking care of personal grooming?: A Little Help from another person toileting, which includes using toliet, bedpan, or urinal?: A Lot Help from another person bathing (including washing, rinsing, drying)?: A Lot Help from another person to put on and taking off regular upper body clothing?: A Lot Help from another person to put on and taking off regular lower body clothing?: A Lot 6 Click Score: 14  End of Session Equipment Utilized During  Treatment: Gait belt Nurse Communication: Mobility status  Activity Tolerance: Patient tolerated treatment well Patient left: in chair;with call bell/phone within reach;with chair alarm set;with nursing/sitter in room  OT Visit Diagnosis: Other abnormalities of gait and mobility (R26.89);Hemiplegia and hemiparesis;Muscle weakness (generalized) (M62.81) Hemiplegia - Right/Left: Right Hemiplegia - dominant/non-dominant: Dominant                Time: 3846-6599 OT Time Calculation (min): 32 min Charges:  OT General Charges $OT Visit: 1 Visit OT Evaluation $OT Eval Moderate Complexity: 1 Mod  Marcy Siren, OT [employer] Pager [phone] Office [phone]  Denise Leon 11/15/2019, 10:01 AM

## 2019-11-15 NOTE — Progress Notes (Signed)
Received patient from  [facility], alert and oriented, implemented MD orders, placed on Tele and verified, continue to monitor

## 2019-11-15 NOTE — Progress Notes (Addendum)
Physical Therapy Treatment Patient Details Name: Denise Leon MRN: [id number] DOB: [birth year] Today's Date: 11/15/2019    History of Present Illness 74 y.o. female past medical history of asthma and HTN who presented to [place] for sudden onset of right arm weakness and right facial weakness; TPA was administered.  Head CT suspicious for early/developing acute left MCA territory ischemia. MRI revealed patchy small volume acute ischemic infarct involving the posterior left frontal region, with involvement of the pre and postcentral gyri. No associated hemorrhage or mass effect.    PT Comments    Prior to admission, pt lives with her daughter and is independent with ADL's and mobility. On PT evaluation, pt presents with right upper extremity hemiparesis, balance impairments, and decreased gait speed. Requiring two person handheld assist for limited room ambulation. Pt with episode of urinary incontinence during mobility. Pt will benefit from CIR to address deficits and promote neuro recovery. Pt is an excellent candidate based on motivation and PLOF.     Follow Up Recommendations  CIR     Equipment Recommendations  Other (comment)(TBA)    Recommendations for Other Services Rehab consult     Precautions / Restrictions Precautions Precautions: Fall Precaution Comments: flaccid RUE Restrictions Weight Bearing Restrictions: No    Mobility  Bed Mobility Overal bed mobility: Needs Assistance Bed Mobility: Supine to Sit     Supine to sit: HOB elevated;Mod assist     General bed mobility comments: VCs for technique and to initiate movements, assist for trunk elevation and guiding BLE's off edge of bed, increased time/effort  Transfers Overall transfer level: Needs assistance Equipment used: 2 person hand held assist Transfers: Sit to/from Stand Sit to Stand: +2 physical assistance;+2 safety/equipment;Min assist         General transfer comment: Min assist to  rise and steady in standing, requires RUE support   Ambulation/Gait Ambulation/Gait assistance: Mod assist;+2 physical assistance;+2 safety/equipment Gait Distance (Feet): 30 Feet Assistive device: 2 person hand held assist Gait Pattern/deviations: Step-to pattern;Decreased stride length;Shuffle;Narrow base of support Gait velocity: decreased Gait velocity interpretation: <1.31 ft/sec, indicative of household ambulator General Gait Details: Pt with slow, cautious, shuffling gait pattern. Requiring modA +2 for stability, RUE supported, and increased reliance through handheld support on left.   Stairs             Wheelchair Mobility    Modified Rankin (Stroke Patients Only) Modified Rankin (Stroke Patients Only) Pre-Morbid Rankin Score: No symptoms Modified Rankin: Moderately severe disability     Balance Overall balance assessment: Needs assistance Sitting-balance support: Feet supported Sitting balance-Leahy Scale: Fair     Standing balance support: Single extremity supported;Bilateral upper extremity supported Standing balance-Leahy Scale: Poor Standing balance comment: reliant on external assist                            Cognition Arousal/Alertness: Awake/alert Behavior During Therapy: WFL for tasks assessed/performed;Flat affect Overall Cognitive Status: Difficult to assess                                 General Comments: pt following basic commands given increased time, intermittently requires rewording of questions, A&Ox4      Exercises Other Exercises Other Exercises: educated in general self-ROM to R hand/wrist - will benefit from continued education and review    General Comments        Pertinent Vitals/Pain  Pain Assessment: Faces Faces Pain Scale: Hurts a little bit Pain Location: RUE at IV site Pain Descriptors / Indicators: Discomfort Pain Intervention(s): Monitored during session    Home Living Family/patient  expects to be discharged to:: Private residence Living Arrangements: Children(daughter) Available Help at Discharge: Family;Available PRN/intermittently(reports daughter works) Type of Home: House Home Access: Level entry(?per pt)   Home Layout: Two level;Able to live on main level with bedroom/bathroom   Additional Comments: home setup obtained from pt however pt with difficulty answering some questions given some language barrier    Prior Function Level of Independence: Independent      Comments: daughter assisting some with iADL   PT Goals (current goals can now be found in the care plan section) Acute Rehab PT Goals Patient Stated Goal: to use her right arm again PT Goal Formulation: With patient Time For Goal Achievement: 11/29/19 Potential to Achieve Goals: Good    Frequency    Min 4X/week      PT Plan      Co-evaluation PT/OT/SLP Co-Evaluation/Treatment: Yes Reason for Co-Treatment: For patient/therapist safety;To address functional/ADL transfers PT goals addressed during session: Mobility/safety with mobility OT goals addressed during session: ADL's and self-care      AM-PAC PT "6 Clicks" Mobility   Outcome Measure  Help needed turning from your back to your side while in a flat bed without using bedrails?: A Little Help needed moving from lying on your back to sitting on the side of a flat bed without using bedrails?: A Lot Help needed moving to and from a bed to a chair (including a wheelchair)?: A Lot Help needed standing up from a chair using your arms (e.g., wheelchair or bedside chair)?: A Little Help needed to walk in hospital room?: A Lot Help needed climbing 3-5 steps with a railing? : A Lot 6 Click Score: 14    End of Session Equipment Utilized During Treatment: Gait belt Activity Tolerance: Patient tolerated treatment well Patient left: in chair;with call bell/phone within reach;with chair alarm set Nurse Communication: Mobility status PT  Visit Diagnosis: Unsteadiness on feet (R26.81);Other abnormalities of gait and mobility (R26.89);Difficulty in walking, not elsewhere classified (R26.2);Other symptoms and signs involving the nervous system (R29.898);Hemiplegia and hemiparesis Hemiplegia - Right/Left: Right Hemiplegia - dominant/non-dominant: Dominant Hemiplegia - caused by: Cerebral infarction     Time: 2956-2130 PT Time Calculation (min) (ACUTE ONLY): 32 min  Charges:                          Lillia Pauls, PT, DPT Acute Rehabilitation Services Pager [phone] Office [phone]    Norval Morton 11/15/2019, 12:45 PM

## 2019-11-15 NOTE — Progress Notes (Signed)
STROKE TEAM PROGRESS NOTE   INTERVAL HISTORY No family is at the bedside.  Pt still has right sided weakness, arm > leg. MRI showed scattered left frontal cortical infarcts. She stated that she has heart palpitation quite often, was on metoprolol before in [place] in the past. No diagnosis of afib as far as she knows.   OBJECTIVE Vitals:   11/15/19 0000 11/15/19 0100 11/15/19 0305 11/15/19 0715  BP: 132/67 (!) 119/53 111/62 134/80  Pulse: 75 80 70 70  Resp: 19 18 18 20   Temp: 98.1 F (36.7 C)  97.8 F (36.6 C) 98.7 F (37.1 C)  TempSrc: Axillary  Oral Oral  SpO2: 98% 96% 99% 99%  Weight:      Height:        CBC:  Recent Labs  Lab 11/13/19 2323 11/15/19 0704  WBC 11.2* 7.0  NEUTROABS 6.0  --   HGB 11.7* 10.1*  HCT 34.6* 31.2*  MCV 92.3 95.7  PLT 318 286    Basic Metabolic Panel:  Recent Labs  Lab 11/13/19 2323 11/15/19 0704  NA 136 140  K 3.7 4.0  CL 102 108  CO2 24 26  GLUCOSE 133* 100*  BUN 28* 22  CREATININE 1.49* 1.48*  CALCIUM 9.4 8.2*    Lipid Panel:     Component Value Date/Time   CHOL 235 (H) 11/14/2019 0926   TRIG 171 (H) 11/14/2019 0926   HDL 45 11/14/2019 0926   CHOLHDL 5.2 11/14/2019 0926   VLDL 34 11/14/2019 0926   LDLCALC 156 (H) 11/14/2019 0926   HgbA1c:  Lab Results  Component Value Date   HGBA1C 5.7 (H) 11/14/2019   Urine Drug Screen:     Component Value Date/Time   LABOPIA NONE DETECTED 11/14/2019 0130   COCAINSCRNUR NONE DETECTED 11/14/2019 0130   LABBENZ NONE DETECTED 11/14/2019 0130   AMPHETMU NONE DETECTED 11/14/2019 0130   THCU NONE DETECTED 11/14/2019 0130   LABBARB NONE DETECTED 11/14/2019 0130    Alcohol Level     Component Value Date/Time   ETH <10 11/13/2019 2323    IMAGING  CT Angio Head W or Wo Contrast CT ANGIO NECK W OR WO CONTRAST 11/14/2019 IMPRESSION:  1. Negative CTA for large vessel occlusion.  2. Mixed atheromatous plaque at the origin of the left ICA with short-segment mild 30% stenosis.  3.  Moderate atherosclerotic change about the aortic arch, with more mild atherosclerotic change about the carotid siphons. No other hemodynamically significant or correctable stenosis identified.  4. 19 mm left thyroid nodule, indeterminate. Further evaluation with dedicated thyroid ultrasound recommended. This could be performed on a nonemergent outpatient basis.   MR BRAIN WO CONTRAST 11/15/2019 IMPRESSION:  1. Patchy small volume acute ischemic infarct involving the posterior left frontal region, with involvement of the pre and postcentral gyri. No associated hemorrhage or mass effect.  2. Underlying age-related cerebral atrophy with moderate to advanced chronic microvascular ischemic disease.   CT HEAD CODE STROKE WO CONTRAST 11/14/2019 IMPRESSION:  1. Question subtle loss of gray-white matter differentiation involving the posterior left frontal region, suspicious for early/developing acute left MCA territory ischemia given provided symptoms. No hyperdense vessel or intracranial hemorrhage.  2. ASPECTS is 9.  3. Age-related cerebral atrophy with moderate chronic microvascular ischemic disease.   ECHOCARDIOGRAM COMPLETE 11/14/2019 IMPRESSIONS   1. Left ventricular ejection fraction, by visual estimation, is 55 to 60%. The left ventricle has normal function. There is no left ventricular hypertrophy.   2. Left ventricular diastolic parameters are  consistent with Grade I diastolic dysfunction (impaired relaxation).   3. The left ventricle has no regional wall motion abnormalities.   4. Global right ventricle has normal systolic function.The right ventricular size is normal. No increase in right ventricular wall thickness.   5. Left atrial size was normal.   6. Right atrial size was normal.   7. The mitral valve is normal in structure. No evidence of mitral valve regurgitation.   8. The tricuspid valve is normal in structure.   9. The tricuspid valve is normal in structure. Tricuspid valve  regurgitation is trivial.  10. The aortic valve is tricuspid. Aortic valve regurgitation is not visualized. Mild aortic valve sclerosis without stenosis.  11. Pulmonic regurgitation is mild.  12. The pulmonic valve was normal in structure. Pulmonic valve regurgitation is mild.  13. The inferior vena cava is normal in size with greater than 50% respiratory variability, suggesting right atrial pressure of 3 mmHg.   ECG - SR rate 98 BPM. (See cardiology reading for complete details)  PHYSICAL EXAM  Temp:  [97.8 F (36.6 C)-99.5 F (37.5 C)] 99.5 F (37.5 C) (01/31 1500) Pulse Rate:  [70-92] 78 (01/31 1500) Resp:  [16-21] 18 (01/31 1500) BP: (97-140)/(51-80) 135/75 (01/31 1500) SpO2:  [96 %-100 %] 99 % (01/31 1500)  General - Well nourished, well developed, in no apparent distress.  Ophthalmologic - fundi not visualized due to noncooperation.  Cardiovascular - Regular rhythm and rate.  Mental Status -  Level of arousal and orientation to time, place, and person were intact. Language including expression, naming, repetition, comprehension was assessed and found intact. Fund of Knowledge was assessed and was intact.  Cranial Nerves II - XII - II - Visual field intact OU. III, IV, VI - Extraocular movements intact. V - Facial sensation intact bilaterally. VII - mild right facial droop. VIII - Hearing & vestibular intact bilaterally. X - Palate elevates symmetrically. XI - Chin turning & shoulder shrug intact bilaterally. XII - Tongue protrusion intact.  Motor Strength - The patient's strength was normal in left UE and LE, however, RUE proximal 1/5, bicep 1/5, tricep 2/5, distal 0/5. RLE 4/5.  Bulk was normal and fasciculations were absent.   Motor Tone - Muscle tone was assessed at the neck and appendages and was normal.  Reflexes - The patient's reflexes were decreased on the right and she had no pathological reflexes.  Sensory - Light touch, temperature/pinprick were assessed  and were symmetrical.    Coordination - The patient had normal movements in the left hand with no ataxia or dysmetria.  Tremor was absent.  Gait and Station - deferred.   ASSESSMENT/PLAN [name] is a 74 y.o. 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   She received IV t-PA per tele-neurology at [facility].   Stroke: Patchy small infarcts at posterior left frontal region - likely embolic - concerning for afib given heart palpitation vs. Left ICA bulb mixed plaques  Code Stroke CT Head - Question subtle loss of gray-white matter differentiation involving the posterior left frontal region, suspicious for early/developing acute left MCA territory ischemia given provided symptoms.   MRI head - Patchy small volume acute ischemic infarct involving the posterior left frontal region, with involvement of the pre and postcentral gyri.  CTA H&N - Mixed atheromatous plaque at the origin of the left ICA  with short-segment mild 30% stenosis.   2D Echo - EF 60 - 65%. No cardiac source of emboli identified.   LE venous doppler pending  Recommend loop recorder to rule out afib if above work up unrevealing  [employer] Virus 2 - negative  LDL - 156  HgbA1c - 5.7  UDS - negative  VTE prophylaxis - lovenox  No antithrombotic prior to admission, now on aspirin 325 mg daily and clopidogrel 75 mg daily due to left ICA bulb mixed plaques.   Patient counseled to be compliant with her antithrombotic medications  Ongoing aggressive stroke risk factor management  Therapy recommendations:  CIR   Disposition:  Pending  Heart palpitation  Has been for year  Average 2/week  No specific triggers  Was on metoprolol intermittently when she was in [place]  Recommend loop recorder to rule out afib if above work up  unrevealing  Hypertension  Home BP meds: Norvasc, metoprolol, lotensin  Current BP meds: metoprolol  Stable . Permissive hypertension (OK if < 180/105) but gradually normalize in 3-5 days . 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 Further evaluation with dedicated thyroid ultrasound recommended. This could be performed on a nonemergent outpatient basis.  Hospital day # 1  Marvel Plan, MD PhD Stroke Neurology 11/15/2019 4:35 PM    To contact Stroke Continuity provider, please refer to [email or web address]. After hours, contact General Neurology

## 2019-11-15 NOTE — Progress Notes (Signed)
Rehab Admissions Coordinator Note:  Patient was screened by Clois Dupes for appropriateness for an Inpatient Acute Rehab Consult per therapy recs. .  At this time, we are recommending Inpatient Rehab consult. I will place order.  Clois Dupes RN MSN 11/15/2019, 4:19 PM  I can be reached at [phone].

## 2019-11-16 ENCOUNTER — Encounter (HOSPITAL_COMMUNITY): Payer: Self-pay | Admitting: Physical Medicine & Rehabilitation

## 2019-11-16 ENCOUNTER — Inpatient Hospital Stay (HOSPITAL_COMMUNITY)
Admission: RE | Admit: 2019-11-16 | Discharge: 2019-12-04 | DRG: 057 | Disposition: A | Discharge status: Home Health | Payer: Medicaid Other | Source: Intra-hospital | Attending: Physical Medicine & Rehabilitation | Admitting: Physical Medicine & Rehabilitation

## 2019-11-16 ENCOUNTER — Other Ambulatory Visit: Payer: Self-pay

## 2019-11-16 ENCOUNTER — Encounter (HOSPITAL_COMMUNITY): Payer: Self-pay | Admitting: Student in an Organized Health Care Education/Training Program

## 2019-11-16 ENCOUNTER — Inpatient Hospital Stay (HOSPITAL_COMMUNITY): Payer: Medicare Other

## 2019-11-16 ENCOUNTER — Other Ambulatory Visit: Payer: Self-pay | Admitting: Physician Assistant

## 2019-11-16 DIAGNOSIS — R29810 Facial weakness: Secondary | ICD-10-CM | POA: Diagnosis present

## 2019-11-16 DIAGNOSIS — E785 Hyperlipidemia, unspecified: Secondary | ICD-10-CM | POA: Diagnosis present

## 2019-11-16 DIAGNOSIS — E041 Nontoxic single thyroid nodule: Secondary | ICD-10-CM

## 2019-11-16 DIAGNOSIS — I69320 Aphasia following cerebral infarction: Secondary | ICD-10-CM | POA: Diagnosis not present

## 2019-11-16 DIAGNOSIS — R002 Palpitations: Secondary | ICD-10-CM | POA: Diagnosis present

## 2019-11-16 DIAGNOSIS — R29705 NIHSS score 5: Secondary | ICD-10-CM | POA: Diagnosis not present

## 2019-11-16 DIAGNOSIS — N183 Chronic kidney disease, stage 3 unspecified: Secondary | ICD-10-CM | POA: Diagnosis present

## 2019-11-16 DIAGNOSIS — M722 Plantar fascial fibromatosis: Secondary | ICD-10-CM | POA: Diagnosis present

## 2019-11-16 DIAGNOSIS — I69392 Facial weakness following cerebral infarction: Secondary | ICD-10-CM

## 2019-11-16 DIAGNOSIS — I10 Essential (primary) hypertension: Secondary | ICD-10-CM | POA: Diagnosis present

## 2019-11-16 DIAGNOSIS — K5900 Constipation, unspecified: Secondary | ICD-10-CM | POA: Diagnosis not present

## 2019-11-16 DIAGNOSIS — I69351 Hemiplegia and hemiparesis following cerebral infarction affecting right dominant side: Secondary | ICD-10-CM | POA: Diagnosis present

## 2019-11-16 DIAGNOSIS — I639 Cerebral infarction, unspecified: Secondary | ICD-10-CM | POA: Diagnosis not present

## 2019-11-16 DIAGNOSIS — I4891 Unspecified atrial fibrillation: Secondary | ICD-10-CM

## 2019-11-16 DIAGNOSIS — I63512 Cerebral infarction due to unspecified occlusion or stenosis of left middle cerebral artery: Secondary | ICD-10-CM

## 2019-11-16 DIAGNOSIS — J45909 Unspecified asthma, uncomplicated: Secondary | ICD-10-CM | POA: Diagnosis present

## 2019-11-16 DIAGNOSIS — G4700 Insomnia, unspecified: Secondary | ICD-10-CM | POA: Diagnosis present

## 2019-11-16 DIAGNOSIS — I129 Hypertensive chronic kidney disease with stage 1 through stage 4 chronic kidney disease, or unspecified chronic kidney disease: Secondary | ICD-10-CM | POA: Diagnosis present

## 2019-11-16 DIAGNOSIS — Z20822 Contact with and (suspected) exposure to covid-19: Secondary | ICD-10-CM | POA: Diagnosis not present

## 2019-11-16 DIAGNOSIS — I6340 Cerebral infarction due to embolism of unspecified cerebral artery: Secondary | ICD-10-CM | POA: Diagnosis not present

## 2019-11-16 DIAGNOSIS — N1832 Chronic kidney disease, stage 3b: Secondary | ICD-10-CM | POA: Diagnosis not present

## 2019-11-16 DIAGNOSIS — Z9282 Status post administration of tPA (rtPA) in a different facility within the last 24 hours prior to admission to current facility: Secondary | ICD-10-CM | POA: Diagnosis not present

## 2019-11-16 DIAGNOSIS — G8191 Hemiplegia, unspecified affecting right dominant side: Secondary | ICD-10-CM | POA: Diagnosis not present

## 2019-11-16 DIAGNOSIS — F802 Mixed receptive-expressive language disorder: Secondary | ICD-10-CM | POA: Diagnosis present

## 2019-11-16 HISTORY — DX: Nontoxic single thyroid nodule: E04.1

## 2019-11-16 HISTORY — DX: Palpitations: R00.2

## 2019-11-16 HISTORY — DX: Chronic kidney disease, stage 3 unspecified: N18.30

## 2019-11-16 HISTORY — DX: Cerebral infarction due to unspecified occlusion or stenosis of left middle cerebral artery: I63.512

## 2019-11-16 HISTORY — DX: Hyperlipidemia, unspecified: E78.5

## 2019-11-16 LAB — BASIC METABOLIC PANEL
Anion gap: 9 (ref 5–15)
BUN: 26 mg/dL — ABNORMAL HIGH (ref 8–23)
CO2: 24 mmol/L (ref 22–32)
Calcium: 8.8 mg/dL — ABNORMAL LOW (ref 8.9–10.3)
Chloride: 108 mmol/L (ref 98–111)
Creatinine, Ser: 1.45 mg/dL — ABNORMAL HIGH (ref 0.44–1.00)
GFR calc Af Amer: 42 mL/min — ABNORMAL LOW (ref >60)
GFR calc non Af Amer: 36 mL/min — ABNORMAL LOW (ref >60)
Glucose, Bld: 116 mg/dL — ABNORMAL HIGH (ref 70–99)
Potassium: 3.9 mmol/L (ref 3.5–5.1)
Sodium: 141 mmol/L (ref 135–145)

## 2019-11-16 LAB — CBC
HCT: 29.3 % — ABNORMAL LOW (ref 36.0–46.0)
Hemoglobin: 9.8 g/dL — ABNORMAL LOW (ref 12.0–15.0)
MCH: 31.1 pg (ref 26.0–34.0)
MCHC: 33.4 g/dL (ref 30.0–36.0)
MCV: 93 fL (ref 80.0–100.0)
Platelets: 267 10*3/uL (ref 150–400)
RBC: 3.15 MIL/uL — ABNORMAL LOW (ref 3.87–5.11)
RDW: 12 % (ref 11.5–15.5)
WBC: 9.5 10*3/uL (ref 4.0–10.5)
nRBC: 0 % (ref 0.0–0.2)

## 2019-11-16 MED ORDER — ENOXAPARIN SODIUM 40 MG/0.4ML ~~LOC~~ SOLN: 40.0000 mg | SUBCUTANEOUS | Status: DC

## 2019-11-16 MED ORDER — LIDOCAINE 5 % EX PTCH
1.0000 | MEDICATED_PATCH | CUTANEOUS | Status: DC
  Administered 2019-11-17 – 2019-11-22 (×4): 1 via TRANSDERMAL
  Filled 2019-11-16 (×4): qty 1

## 2019-11-16 MED ORDER — MUSCLE RUB 10-15 % EX CREA
TOPICAL_CREAM | CUTANEOUS | Status: DC | PRN
  Filled 2019-11-16: qty 85

## 2019-11-16 MED ORDER — GABAPENTIN 400 MG PO CAPS
400.0000 mg | ORAL_CAPSULE | Freq: Two times a day (BID) | ORAL | Status: DC
  Administered 2019-11-17 – 2019-12-04 (×35): 400 mg via ORAL
  Filled 2019-11-16 (×36): qty 1

## 2019-11-16 MED ORDER — ENOXAPARIN SODIUM 30 MG/0.3ML ~~LOC~~ SOLN
30.0000 mg | SUBCUTANEOUS | Status: DC
  Administered 2019-11-17 – 2019-11-22 (×6): 30 mg via SUBCUTANEOUS
  Filled 2019-11-16 (×6): qty 0.3

## 2019-11-16 MED ORDER — ADULT MULTIVITAMIN W/MINERALS CH
1.0000 | ORAL_TABLET | Freq: Every day | ORAL | Status: DC
  Administered 2019-11-16: 1 via ORAL
  Filled 2019-11-16: qty 1

## 2019-11-16 MED ORDER — LIDOCAINE 5 % EX PTCH: 1.0000 | MEDICATED_PATCH | CUTANEOUS | 0 refills | Status: DC

## 2019-11-16 MED ORDER — ALBUTEROL SULFATE (2.5 MG/3ML) 0.083% IN NEBU: 2.5000 mg | INHALATION_SOLUTION | Freq: Four times a day (QID) | RESPIRATORY_TRACT | 12 refills | Status: DC | PRN

## 2019-11-16 MED ORDER — ACETAMINOPHEN 325 MG PO TABS
650.0000 mg | ORAL_TABLET | ORAL | Status: DC | PRN
  Administered 2019-11-17 – 2019-11-19 (×3): 650 mg via ORAL
  Filled 2019-11-16 (×4): qty 2

## 2019-11-16 MED ORDER — ACETAMINOPHEN 650 MG RE SUPP: 650.0000 mg | RECTAL | Status: DC | PRN

## 2019-11-16 MED ORDER — MUSCLE RUB 10-15 % EX CREA: 1.0000 "application " | TOPICAL_CREAM | CUTANEOUS | 0 refills | Status: DC | PRN

## 2019-11-16 MED ORDER — ATORVASTATIN CALCIUM 80 MG PO TABS
80.0000 mg | ORAL_TABLET | Freq: Every day | ORAL | Status: DC
  Administered 2019-11-17 – 2019-12-04 (×18): 80 mg via ORAL
  Filled 2019-11-16 (×18): qty 1

## 2019-11-16 MED ORDER — METOPROLOL SUCCINATE ER 25 MG PO TB24
25.0000 mg | ORAL_TABLET | Freq: Every day | ORAL | Status: DC
  Administered 2019-11-17 – 2019-12-04 (×18): 25 mg via ORAL
  Filled 2019-11-16 (×18): qty 1

## 2019-11-16 MED ORDER — ACETAMINOPHEN 160 MG/5ML PO SOLN: 650.0000 mg | ORAL | Status: DC | PRN

## 2019-11-16 MED ORDER — MELATONIN 3 MG PO TABS: 3.0000 mg | ORAL_TABLET | Freq: Every day | ORAL | 0 refills | Status: DC

## 2019-11-16 MED ORDER — ENSURE ENLIVE PO LIQD: 237.0000 mL | Freq: Three times a day (TID) | ORAL | 12 refills | Status: DC

## 2019-11-16 MED ORDER — MELATONIN 3 MG PO TABS
3.0000 mg | ORAL_TABLET | Freq: Every day | ORAL | Status: DC
  Filled 2019-11-16 (×3): qty 1

## 2019-11-16 MED ORDER — ASPIRIN EC 325 MG PO TBEC
325.0000 mg | DELAYED_RELEASE_TABLET | Freq: Every day | ORAL | Status: DC
  Administered 2019-11-17 – 2019-12-04 (×18): 325 mg via ORAL
  Filled 2019-11-16 (×19): qty 1

## 2019-11-16 MED ORDER — ALBUTEROL SULFATE (2.5 MG/3ML) 0.083% IN NEBU: 2.5000 mg | INHALATION_SOLUTION | Freq: Four times a day (QID) | RESPIRATORY_TRACT | Status: DC | PRN

## 2019-11-16 MED ORDER — ATORVASTATIN CALCIUM 80 MG PO TABS: 80.0000 mg | ORAL_TABLET | Freq: Every day | ORAL | Status: DC

## 2019-11-16 MED ORDER — CLOPIDOGREL BISULFATE 75 MG PO TABS: 75.0000 mg | ORAL_TABLET | Freq: Every day | ORAL | Status: DC

## 2019-11-16 MED ORDER — CLOPIDOGREL BISULFATE 75 MG PO TABS
75.0000 mg | ORAL_TABLET | Freq: Every day | ORAL | Status: DC
  Administered 2019-11-17 – 2019-12-04 (×18): 75 mg via ORAL
  Filled 2019-11-16 (×18): qty 1

## 2019-11-16 MED ORDER — SENNOSIDES-DOCUSATE SODIUM 8.6-50 MG PO TABS
1.0000 | ORAL_TABLET | Freq: Every evening | ORAL | Status: DC | PRN
  Administered 2019-11-21: 1 via ORAL
  Filled 2019-11-16: qty 1

## 2019-11-16 MED ORDER — ADULT MULTIVITAMIN W/MINERALS CH: 1.0000 | ORAL_TABLET | Freq: Every day | ORAL | Status: DC

## 2019-11-16 MED ORDER — ENSURE ENLIVE PO LIQD: 237.0000 mL | Freq: Three times a day (TID) | ORAL | Status: DC

## 2019-11-16 MED ORDER — ASPIRIN 325 MG PO TBEC: 325.0000 mg | DELAYED_RELEASE_TABLET | Freq: Every day | ORAL | 0 refills | Status: DC

## 2019-11-16 MED ORDER — LATANOPROST 0.005 % OP SOLN: 1.0000 [drp] | Freq: Every day | OPHTHALMIC | 12 refills | Status: DC

## 2019-11-16 MED ORDER — SORBITOL 70 % SOLN
30.0000 mL | Freq: Every day | Status: DC | PRN
  Administered 2019-11-25: 30 mL via ORAL
  Filled 2019-11-16: qty 30

## 2019-11-16 MED ORDER — LATANOPROST 0.005 % OP SOLN
1.0000 [drp] | Freq: Every day | OPHTHALMIC | Status: DC
  Administered 2019-11-16 – 2019-12-03 (×18): 1 [drp] via OPHTHALMIC
  Filled 2019-11-16: qty 2.5

## 2019-11-16 MED ORDER — PANTOPRAZOLE SODIUM 40 MG PO TBEC
40.0000 mg | DELAYED_RELEASE_TABLET | Freq: Every day | ORAL | Status: DC
  Administered 2019-11-17 – 2019-12-04 (×18): 40 mg via ORAL
  Filled 2019-11-16 (×18): qty 1

## 2019-11-16 MED ORDER — ENOXAPARIN SODIUM 30 MG/0.3ML ~~LOC~~ SOLN: 30.0000 mg | SUBCUTANEOUS | Status: DC

## 2019-11-16 NOTE — H&P (Signed)
Physical Medicine and Rehabilitation Admission H&P        Chief Complaint  Patient presents with  . Weakness  : HPI: Denise Leon is a 74 year old right-handed female with history of asthma, CKD stage III, hip and neck surgery as well as hypertension.  Per chart review lives with daughter.  Up until October patient had been living alone in [place].  Independent mobility prior to admission.  Daughter did assist with some basic ADLs.  Presented 11/14/2019 with headache, right side weakness and facial droop.  Cranial CT scan showed question subtle loss of gray-white matter differentiation involving the posterior left frontal region suspicious for early developing acute left MCA territory ischemia.  Patient did receive TPA.  Admission chemistries BUN 28, creatinine 1.49, SARS coronavirus negative, urinalysis negative nitrite.  CT angiogram of head and neck negative for large vessel occlusion.  There was an incidental finding of a 19 mm left thyroid nodule advised follow-up outpatient.  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  Echocardiogram with ejection fraction of 60% without emboli.  Neurology follow-up maintained on aspirin 325 mg daily and Plavix 75 mg daily.  Lovenox for DVT prophylaxis.  Plan 30 day cardiac event monitor..  Therapy evaluations completed and patient was admitted for a comprehensive rehab program  Patient has episodes of fluttering in her chest.  She states she has not had it in the hospital but just at home.  She does not get any chest pains or shortness of breath with therapy.  She denies heartburn or indigestion  Discussed placement of 30-day event monitor with electrophysiology.   Review of Systems  Constitutional: Negative for fever.  HENT: Negative for hearing loss.   Eyes: Negative for blurred vision and double vision.  Respiratory: Negative for cough and  shortness of breath.   Cardiovascular: Positive for palpitations. Negative for chest pain and leg swelling.  Gastrointestinal: Positive for constipation and nausea. Negative for heartburn.  Genitourinary: Negative for dysuria, flank pain and hematuria.  Musculoskeletal: Positive for back pain, joint pain and myalgias.  Skin: Negative for rash.  Neurological: Positive for dizziness and headaches.  Psychiatric/Behavioral: The patient has insomnia.   All other systems reviewed and are negative.       Past Medical History:  Diagnosis Date  . Asthma    . Hypertension           Past Surgical History:  Procedure Laterality Date  . HIP SURGERY      . NECK SURGERY        No family history on file. Social History:  reports that she has never smoked. She has never used smokeless tobacco. She reports that she does not drink alcohol or use drugs. Allergies: No Known Allergies Medications Prior to Admission  Medication Sig Dispense Refill  . albuterol (VENTOLIN HFA) 108 (90 Base) MCG/ACT inhaler Inhale 2 puffs into the lungs every 6 (six) hours as needed for wheezing or shortness of breath.       Marland Kitchen amLODipine (NORVASC) 5 MG tablet Take 5 mg by mouth daily.      Marland Kitchen atorvastatin (LIPITOR) 20 MG tablet Take 20 mg by mouth daily.      . benazepril (LOTENSIN) 20 MG tablet Take 20 mg by mouth daily.      . dorzolamidel-timolol (COSOPT) 22.3-6.8 MG/ML SOLN ophthalmic solution Place 1 drop into both eyes 2 (two) times daily.      Marland Kitchen  gabapentin (NEURONTIN) 400 MG capsule Take 400 mg by mouth 2 (two) times daily.      Marland Kitchen latanoprost (XALATAN) 0.005 % ophthalmic solution Place 1 drop into both eyes at bedtime.      . lidocaine (LIDODERM) 5 % Place 1 patch onto the skin daily. Remove & Discard patch within 12 hours or as directed by MD      . meclizine (ANTIVERT) 12.5 MG tablet Take 12.5 mg by mouth 3 (three) times daily as needed for dizziness.      . Melatonin 5 MG TABS Take 5 mg by mouth at bedtime.        . metoprolol succinate (TOPROL-XL) 25 MG 24 hr tablet Take 25 mg by mouth daily.          Drug Regimen Review Drug regimen was reviewed and remains appropriate with no significant issues identified   Home: Home Living Family/patient expects to be discharged to:: Private residence Living Arrangements: Children(daughter) Available Help at Discharge: Family, Available PRN/intermittently(reports daughter works) Type of Home: House Home Access: Level entry(?per pt) Home Layout: Two level, Able to live on main level with bedroom/bathroom Additional Comments: home setup obtained from pt however pt with difficulty answering some questions given some language barrier  Lives With: Daughter   Functional History: Prior Function Level of Independence: Independent Comments: daughter assisting some with iADL   Functional Status:  Mobility: Bed Mobility Overal bed mobility: Needs Assistance Bed Mobility: Supine to Sit Supine to sit: HOB elevated, Mod assist General bed mobility comments: VCs for technique and to initiate movements, assist for trunk elevation and guiding BLE's off edge of bed, increased time/effort Transfers Overall transfer level: Needs assistance Equipment used: 2 person hand held assist Transfers: Sit to/from Stand Sit to Stand: +2 physical assistance, +2 safety/equipment, Min assist General transfer comment: Min assist to rise and steady in standing, requires RUE support  Ambulation/Gait Ambulation/Gait assistance: Mod assist, +2 physical assistance, +2 safety/equipment Gait Distance (Feet): 30 Feet Assistive device: 2 person hand held assist Gait Pattern/deviations: Step-to pattern, Decreased stride length, Shuffle, Narrow base of support General Gait Details: Pt with slow, cautious, shuffling gait pattern. Requiring modA +2 for stability, RUE supported, and increased reliance through handheld support on left. Gait velocity: decreased Gait velocity interpretation:  <1.31 ft/sec, indicative of household ambulator   ADL: ADL Overall ADL's : Needs assistance/impaired Eating/Feeding: Set up, Minimal assistance, Sitting Grooming: Minimal assistance, Sitting Upper Body Bathing: Moderate assistance, Sitting Lower Body Bathing: Moderate assistance, +2 for physical assistance, +2 for safety/equipment, Sit to/from stand Upper Body Dressing : Moderate assistance, Sitting Lower Body Dressing: Maximal assistance, +2 for physical assistance, +2 for safety/equipment, Sit to/from stand Lower Body Dressing Details (indicate cue type and reason): donning new socks and mesh underwear Toilet Transfer: Moderate assistance, +2 for physical assistance, +2 for safety/equipment, Ambulation, BSC Toilet Transfer Details (indicate cue type and reason): mobility with HHA, use of BSC in room due to urgency  Toileting- Clothing Manipulation and Hygiene: Maximal assistance, +2 for physical assistance, +2 for safety/equipment, Sit to/from stand Toileting - Clothing Manipulation Details (indicate cue type and reason): RN assisting with pericare in standing while therapists assist with standing balance Functional mobility during ADLs: Moderate assistance, +2 for physical assistance, +2 for safety/equipment(HHA) General ADL Comments: pt with decreased sitting/standing balance, RUE deficits, ?impaired cognition (vs language barrier)   Cognition: Cognition Overall Cognitive Status: Difficult to assess Arousal/Alertness: Awake/alert Orientation Level: Oriented X4 Attention: Sustained Sustained Attention: Appears intact Memory: Appears intact Awareness:  Appears intact Safety/Judgment: Appears intact Cognition Arousal/Alertness: Awake/alert Behavior During Therapy: WFL for tasks assessed/performed, Flat affect Overall Cognitive Status: Difficult to assess General Comments: pt following basic commands given increased time, intermittently requires rewording of questions,  A&Ox4 Difficult to assess due to: Non-English speaking(speaks some english, stratus not available)   Physical Exam: Blood pressure (!) 141/64, pulse 73, temperature 98.4 F (36.9 C), temperature source Oral, resp. rate 18, height 5\' 2"  (1.575 m), weight 61.9 kg, SpO2 100 %. Physical Exam  Neurological:  Patient is alert in no acute distress.  Makes good eye contact with examiner.  Follows simple commands.  She does provide her name and age.  She does appear to have some functional expressive receptive language deficits.   General: No acute distress Mood and affect are appropriate Heart: Regular rate and rhythm no rubs murmurs or extra sounds Lungs: Clear to auscultation, breathing unlabored, no rales or wheezes Abdomen: Positive bowel sounds, soft nontender to palpation, nondistended Extremities: No clubbing, cyanosis, or edema Skin: No evidence of breakdown, no evidence of rash Neurologic: Cranial nerves II through XII intact, motor strength is 5/5 in left deltoid, bicep, tricep, grip, hip flexor, knee extensors, ankle dorsiflexor and plantar flexor Trace right finger flexors trace right triceps otherwise 0/5 in the right upper extremity. 3 - at the right hip flexor knee extensor ankle dorsiflexor and plantar flexor.  Sensory exam normal sensation to light touch and proprioception in bilateral upper and lower extremities Cerebellar exam normal left finger to nose to finger right side not tested due to weakness musculoskeletal: No pain with passive range of motion of the right upper extremity, no evidence of joint effusion in the upper or lower limbs    Lab Results Last 48 Hours        Results for orders placed or performed during the hospital encounter of 11/13/19 (from the past 48 hour(s))  CBC     Status: Abnormal    Collection Time: 11/15/19  7:04 AM  Result Value Ref Range    WBC 7.0 4.0 - 10.5 K/uL    RBC 3.26 (L) 3.87 - 5.11 MIL/uL    Hemoglobin 10.1 (L) 12.0 - 15.0 g/dL     HCT 11/17/19 (L) 16.1 - 46.0 %    MCV 95.7 80.0 - 100.0 fL    MCH 31.0 26.0 - 34.0 pg    MCHC 32.4 30.0 - 36.0 g/dL    RDW 09.6 04.5 - 40.9 %    Platelets 286 150 - 400 K/uL    nRBC 0.0 0.0 - 0.2 %      Comment: Performed at [facility], [address] [place], [place]  Basic metabolic panel     Status: Abnormal    Collection Time: 11/15/19  7:04 AM  Result Value Ref Range    Sodium 140 135 - 145 mmol/L    Potassium 4.0 3.5 - 5.1 mmol/L    Chloride 108 98 - 111 mmol/L    CO2 26 22 - 32 mmol/L    Glucose, Bld 100 (H) 70 - 99 mg/dL    BUN 22 8 - 23 mg/dL    Creatinine, Ser 11/17/19 (H) 0.44 - 1.00 mg/dL    Calcium 8.2 (L) 8.9 - 10.3 mg/dL    GFR calc non Af Amer 35 (L) >60 mL/min    GFR calc Af Amer 41 (L) >60 mL/min    Anion gap 6 5 - 15      Comment: Performed at  [facility], [address] [place] [place], [place] [address]  CBC     Status: Abnormal    Collection Time: 11/16/19  2:03 AM  Result Value Ref Range    WBC 9.5 4.0 - 10.5 K/uL    RBC 3.15 (L) 3.87 - 5.11 MIL/uL    Hemoglobin 9.8 (L) 12.0 - 15.0 g/dL    HCT 54.0 (L) 08.6 - 46.0 %    MCV 93.0 80.0 - 100.0 fL    MCH 31.1 26.0 - 34.0 pg    MCHC 33.4 30.0 - 36.0 g/dL    RDW 76.1 95.0 - 93.2 %    Platelets 267 150 - 400 K/uL    nRBC 0.0 0.0 - 0.2 %      Comment: Performed at [facility], [address], [place], [place] [address]  Basic metabolic panel     Status: Abnormal    Collection Time: 11/16/19  2:03 AM  Result Value Ref Range    Sodium 141 135 - 145 mmol/L    Potassium 3.9 3.5 - 5.1 mmol/L    Chloride 108 98 - 111 mmol/L    CO2 24 22 - 32 mmol/L    Glucose, Bld 116 (H) 70 - 99 mg/dL    BUN 26 (H) 8 - 23 mg/dL    Creatinine, Ser 5.80 (H) 0.44 - 1.00 mg/dL    Calcium 8.8 (L) 8.9 - 10.3 mg/dL    GFR calc non Af Amer 36 (L) >60 mL/min    GFR calc Af Amer 42 (L) >60 mL/min    Anion gap 9 5 - 15      Comment: Performed at [facility], [address] [place] [address], [place], [place] [address]        Imaging Results (Last 48 hours)  MR BRAIN WO CONTRAST   Result Date: 11/15/2019 CLINICAL DATA:  Initial evaluation for acute stroke, right upper extremity weakness. EXAM: MRI HEAD WITHOUT CONTRAST TECHNIQUE: Multiplanar, multiecho pulse sequences of the brain and surrounding structures were obtained without intravenous contrast. COMPARISON:  Prior CT and CTA from 11/14/2019. FINDINGS: Brain: Mildly advanced age-related cerebral atrophy. Patchy T2/FLAIR hyperintensity within the periventricular deep white matter both cerebral hemispheres most consistent with chronic microvascular ischemic disease, moderate to advanced in nature. Few scattered superimposed remote lacunar infarcts present within the hemispheric cerebral white matter. Patchy small volume foci of restricted diffusion seen involving the posterior left frontal region, with involvement of the pre and postcentral gyri (series 5, image 86). No associated hemorrhage or mass effect. No other evidence for acute or subacute ischemia. Gray-white matter differentiation otherwise maintained. No other areas of remote cortical infarction. No foci of susceptibility artifact to suggest acute or chronic intracranial hemorrhage. No mass lesion, midline shift or mass effect. No hydrocephalus. No extra-axial fluid collection. 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 Midline structures intact. Vascular: Major intracranial vascular flow voids are maintained. Skull and upper cervical spine: Craniocervical junction within normal limits. Bone marrow signal intensity normal. No scalp soft tissue abnormality. Sinuses/Orbits: Patient status post bilateral ocular lens replacement. Globes orbital soft tissues demonstrate no acute finding. Mild scattered mucosal thickening noted within the ethmoidal air cells and maxillary sinuses. No significant mastoid effusion. Inner ear structures normal. Other: None. IMPRESSION: 1. Patchy small volume acute ischemic infarct involving the  posterior left frontal region, with involvement of the pre and postcentral gyri. No associated hemorrhage or mass effect. 2. Underlying age-related cerebral atrophy with moderate to advanced chronic microvascular  ischemic disease. Electronically Signed   By: Rise Mu M.D.   On: 11/15/2019 00:28    ECHOCARDIOGRAM COMPLETE   Result Date: 11/14/2019   ECHOCARDIOGRAM REPORT   Patient Name:   Denise Leon Date of Exam: 11/14/2019 Medical Rec #:  [id number]    Height:       62.0 in Accession #:    [id number]   Weight:       136.5 lb Date of Birth:  07-21-1946   BSA:          1.63 m Patient Age:    73 years     BP:           135/71 mmHg Patient Gender: F            HR:           80 bpm. Exam Location:  Inpatient Procedure: 2D Echo, Cardiac Doppler and Color Doppler Indications:    Stroke 434.91  History:        Patient has no prior history of Echocardiogram examinations.                 Stroke; Risk Factors:Non-Smoker.  Sonographer:    Tonia Ghent RDCS Referring Phys: [id number] ASHISH ARORA IMPRESSIONS  1. Left ventricular ejection fraction, by visual estimation, is 55 to 60%. The left ventricle has normal function. There is no left ventricular hypertrophy.  2. Left ventricular diastolic parameters are consistent with Grade I diastolic dysfunction (impaired relaxation).  3. The left ventricle has no regional wall motion abnormalities.  4. Global right ventricle has normal systolic function.The right ventricular size is normal. No increase in right ventricular wall thickness.  5. Left atrial size was normal.  6. Right atrial size was normal.  7. The mitral valve is normal in structure. No evidence of mitral valve regurgitation.  8. The tricuspid valve is normal in structure.  9. The tricuspid valve is normal in structure. Tricuspid valve regurgitation is trivial. 10. The aortic valve is tricuspid. Aortic valve regurgitation is not visualized. Mild aortic valve sclerosis without stenosis. 11. Pulmonic  regurgitation is mild. 12. The pulmonic valve was normal in structure. Pulmonic valve regurgitation is mild. 13. The inferior vena cava is normal in size with greater than 50% respiratory variability, suggesting right atrial pressure of 3 mmHg. FINDINGS  Left Ventricle: Left ventricular ejection fraction, by visual estimation, is 55 to 60%. The left ventricle has normal function. The left ventricle has no regional wall motion abnormalities. The left ventricular internal cavity size was the left ventricle is normal in size. There is no left ventricular hypertrophy. Left ventricular diastolic parameters are consistent with Grade I diastolic dysfunction (impaired relaxation). Right Ventricle: The right ventricular size is normal. No increase in right ventricular wall thickness. Global RV systolic function is has normal systolic function. Left Atrium: Left atrial size was normal in size. Right Atrium: Right atrial size was normal in size Pericardium: There is no evidence of pericardial effusion. Mitral Valve: The mitral valve is normal in structure. No evidence of mitral valve regurgitation. Tricuspid Valve: The tricuspid valve is normal in structure. Tricuspid valve regurgitation is trivial. Aortic Valve: The aortic valve is tricuspid. Aortic valve regurgitation is not visualized. Mild aortic valve sclerosis is present, with no evidence of aortic valve stenosis. Pulmonic Valve: The pulmonic valve was normal in structure. Pulmonic valve regurgitation is mild. Pulmonic regurgitation is mild. Aorta: The aortic root is normal in size and structure. Venous: The inferior vena cava is normal  in size with greater than 50% respiratory variability, suggesting right atrial pressure of 3 mmHg. IAS/Shunts: No atrial level shunt detected by color flow Doppler.  LEFT VENTRICLE PLAX 2D LVIDd:         4.70 cm       Diastology LVIDs:         3.70 cm       LV e' lateral:   6.49 cm/s LV PW:         0.80 cm       LV E/e' lateral: 8.8 LV  IVS:        0.80 cm       LV e' medial:    5.27 cm/s LVOT diam:     1.90 cm       LV E/e' medial:  10.8 LV SV:         44 ml LV SV Index:   26.68 LVOT Area:     2.84 cm  LV Volumes (MOD) LV area d, A2C:    28.90 cm LV area d, A4C:    26.40 cm LV area s, A2C:    17.00 cm LV area s, A4C:    16.80 cm LV major d, A2C:   7.61 cm LV major d, A4C:   7.45 cm LV major s, A2C:   6.08 cm LV major s, A4C:   6.39 cm LV vol d, MOD A2C: 91.3 ml LV vol d, MOD A4C: 77.5 ml LV vol s, MOD A2C: 40.3 ml LV vol s, MOD A4C: 36.4 ml LV SV MOD A2C:     51.0 ml LV SV MOD A4C:     77.5 ml LV SV MOD BP:      46.2 ml RIGHT VENTRICLE RV S prime:     14.70 cm/s TAPSE (M-mode): 1.9 cm LEFT ATRIUM             Index       RIGHT ATRIUM          Index LA diam:        3.20 cm 1.97 cm/m  RA Area:     8.66 cm LA Vol (A2C):   22.1 ml 13.60 ml/m RA Volume:   15.70 ml 9.66 ml/m LA Vol (A4C):   18.5 ml 11.38 ml/m LA Biplane Vol: 21.2 ml 13.05 ml/m  AORTIC VALVE LVOT Vmax:   75.70 cm/s LVOT Vmean:  55.000 cm/s LVOT VTI:    0.146 m  AORTA Ao Root diam: 3.00 cm MITRAL VALVE MV Area (PHT): 2.34 cm             SHUNTS MV PHT:        93.96 msec           Systemic VTI:  0.15 m MV Decel Time: 324 msec             Systemic Diam: 1.90 cm MV E velocity: 56.90 cm/s 103 cm/s MV A velocity: 72.70 cm/s 70.3 cm/s MV E/A ratio:  0.78       1.5  Dietrich PatesPaula Ross MD Electronically signed by Dietrich PatesPaula Ross MD Signature Date/Time: 11/14/2019/1:18:05 PM    Final              Medical Problem List and Plan: 1.  Right side weakness with facial droop  secondary to patchy small infarct posterior left frontal region.  Status post TPA.  Plan 30 day event monitor.             -patient may shower             -  ELOS/Goals: 10 to 14 days 2.  Antithrombotics: -DVT/anticoagulation: Lovenox.  Lower extremity Dopplers negative             -antiplatelet therapy: Aspirin 325 mg daily and Plavix 75 mg daily 3. Pain Management: Neurontin 400 mg twice daily, Lidoderm patch 4. Mood:  Melatonin 3 mg nightly             -antipsychotic agents: N/A 5. Neuropsych: This patient is capable of making decisions on her own behalf. 6. Skin/Wound Care: Routine skin checks 7. Fluids/Electrolytes/Nutrition: Routine in and outs with follow-up chemistries 8.  Hypertension.  Toprol-XL 25 mg daily.  Monitor with increased mobility 9.  CKD stage III.  Follow-up chemistries 10.  Hyperlipidemia.  Lipitor          Mcarthur RossettiDaniel J Angiulli, PA-C 11/16/2019 "I have personally performed a face to face diagnostic evaluation of this patient.  Additionally, I have reviewed and concur with the physician assistant's documentation above." Erick ColaceAndrew E. Adlee Paar M.D. [facility] FAAPM&R (Neuromuscular Med) Diplomate Am Board of Electrodiagnostic Med Fellow Am Board of Interventional Pain

## 2019-11-16 NOTE — PMR Pre-admission (Signed)
PMR Admission Coordinator Pre-Admission Assessment  Patient: Denise Leon is an 74 y.o., female MRN: [id number] DOB: 18-Dec-1945 Height: 5' 2"  (157.5 cm) Weight: 61.9 kg  Insurance Information HMO:     PPO:      PCP:      IPA:      80/20:      OTHER:  PRIMARY: Medicaid      Policy#: [id number]      Subscriber: patient Coverage Code: MAA Pre-Cert#: verified eligibility online      Employer:  Benefits:  Phone #:      Name:  Eff. Date: active as of 11/16/2019  Deduct:       Out of Pocket Max:       Life Max:  CIR: 100%      SNF:  Outpatient:      Co-Pay:  Home Health:       Co-Pay:  DME:      Co-Pay:  Providers:  SECONDARY:       Policy#:       Subscriber:  CM Name:       Phone#:      Fax#:  Pre-Cert#:       Employer:  Benefits:  Phone #:      Name:  Eff. Date:      Deduct:       Out of Pocket Max:       Life Max:  CIR:       SNF:  Outpatient:      Co-Pay:  Home Health:       Co-Pay:  DME:      Co-Pay:   Medicaid Application Date:       Case Manager:  Disability Application Date:       Case Worker:   The "Data Collection Information Summary" for patients in Inpatient Rehabilitation Facilities with attached "Privacy Act [place] Records" was provided and verbally reviewed with: Patient and Family  Emergency Contact Information Contact Information    Name Relation Home Work [place], [place] Daughter   [phone]      Current Medical History  Patient Admitting Diagnosis: L MCA CVA  History of Present Illness: Inaara Tye is a 74 year old right-handed female with history of asthma, CKD stage III, hip and neck surgery as well as hypertension.  Presented 11/14/2019 with headache, right side weakness and facial droop.  Cranial CT scan showed question subtle loss of gray-white matter differentiation involving the posterior left frontal region suspicious for early developing acute left MCA territory ischemia.  Patient did receive TPA.  Admission  chemistries BUN 28, creatinine 1.49, SARS coronavirus negative, urinalysis negative nitrite.  CT angiogram of head and neck negative for large vessel occlusion.  There was an incidental finding of a 19 mm left thyroid nodule advised follow-up outpatient.  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  Echocardiogram with ejection fraction of 60% without emboli.  Neurology follow-up maintained on aspirin 325 mg daily and Plavix 75 mg daily.  Lovenox for DVT prophylaxis.  Plan 30 day cardiac event monitor.  Therapy evaluations completed and patient was recommended for a comprehensive rehab program.   Complete NIHSS TOTAL: 3  Patient's medical record from [facility] has been reviewed by the rehabilitation admission coordinator and physician.  Past Medical History  Past Medical History:  Diagnosis Date  . Asthma   . Hypertension  Family History   family history includes Lung disease in her mother.  Prior Rehab/Hospitalizations Has the patient had prior rehab or hospitalizations prior to admission? No  Has the patient had major surgery during 100 days prior to admission? No   Current Medications  Current Facility-Administered Medications:  .   stroke: mapping our early stages of recovery book, , Does not apply, Once, Amie Portland, MD .  acetaminophen (TYLENOL) tablet 650 mg, 650 mg, Oral, Q4H PRN, 650 mg at 11/14/19 2100 **OR** acetaminophen (TYLENOL) 160 MG/5ML solution 650 mg, 650 mg, Per Tube, Q4H PRN **OR** acetaminophen (TYLENOL) suppository 650 mg, 650 mg, Rectal, Q4H PRN, Amie Portland, MD .  albuterol (PROVENTIL) (2.5 MG/3ML) 0.083% nebulizer solution 2.5 mg, 2.5 mg, Inhalation, Q6H PRN, Amie Portland, MD .  aspirin EC tablet 325 mg, 325 mg, Oral, Daily, Rosalin Hawking, MD, 325 mg at 11/16/19 0927 .  atorvastatin (LIPITOR) tablet 80 mg, 80 mg, Oral, Daily, Rosalin Hawking, MD, 80 mg at 11/16/19 4098 .  Chlorhexidine Gluconate Cloth 2 % PADS 6 each, 6 each, Topical, Daily, Amie Portland, MD, 6 each at 11/14/19 0910 .  clopidogrel (PLAVIX) tablet 75 mg, 75 mg, Oral, Daily, Rosalin Hawking, MD, 75 mg at 11/16/19 1191 .  enoxaparin (LOVENOX) injection 30 mg, 30 mg, Subcutaneous, Q24H, Rosalin Hawking, MD, 30 mg at 11/15/19 1939 .  feeding supplement (ENSURE ENLIVE) (ENSURE ENLIVE) liquid 237 mL, 237 mL, Oral, BID BM, Amie Portland, MD, 237 mL at 11/16/19 0927 .  gabapentin (NEURONTIN) capsule 400 mg, 400 mg, Oral, BID, Amie Portland, MD, 400 mg at 11/16/19 4782 .  latanoprost (XALATAN) 0.005 % ophthalmic solution 1 drop, 1 drop, Both Eyes, QHS, Rosalin Hawking, MD, 1 drop at 11/15/19 2106 .  lidocaine (LIDODERM) 5 % 1 patch, 1 patch, Transdermal, Q24H, Amie Portland, MD, 1 patch at 11/16/19 1000 .  MEDLINE mouth rinse, 15 mL, Mouth Rinse, BID, Amie Portland, MD, 15 mL at 11/16/19 0928 .  Melatonin TABS 3 mg, 3 mg, Oral, QHS, Amie Portland, MD, 3 mg at 11/15/19 2106 .  metoprolol succinate (TOPROL-XL) 24 hr tablet 25 mg, 25 mg, Oral, Daily, Rosalin Hawking, MD, 25 mg at 11/16/19 9562 .  Muscle Rub CREA, , Topical, PRN, Amie Portland, MD, 1 application at 13/08/65 1324 .  pantoprazole (PROTONIX) EC tablet 40 mg, 40 mg, Oral, Daily, Rosalin Hawking, MD, 40 mg at 11/16/19 0927 .  senna-docusate (Senokot-S) tablet 1 tablet, 1 tablet, Oral, QHS PRN, Amie Portland, MD  Patients Current Diet:  Diet Order            DIET DYS 3 Room service appropriate? Yes with Assist; Fluid consistency: Thin  Diet effective now              Precautions / Restrictions Precautions Precautions: Fall Precaution Comments: flaccid RUE Restrictions Weight Bearing Restrictions: No   Has the patient had 2 or more falls or a fall with injury in the past year? No  Prior Activity Level Community (5-7x/wk): not driving, no AD prior to admission  Prior Functional Level Self Care: Did the patient need  help bathing, dressing, using the toilet or eating? Independent  Indoor Mobility: Did the patient need assistance with walking from room to room (with or without device)? Independent  Stairs: Did the patient need assistance with internal or external stairs (with or without device)? Independent  Functional Cognition: Did the patient need help planning regular tasks such as shopping or remembering to take medications? Needed some help  Home Assistive Devices / Equipment Home Assistive Devices/Equipment: Shower chair with back, Environmental consultant (specify type)(four wheeled walker)  Prior Device Use: Indicate devices/aids used by the patient prior to current illness, exacerbation or injury? None of the above  Current Functional Level Cognition  Arousal/Alertness: Awake/alert Overall Cognitive Status: Difficult to assess Difficult to assess due to: Non-English speaking(speaks some english, stratus not available) Orientation Level: Oriented X4 General Comments: pt following basic commands given increased time, intermittently requires rewording of questions, A&Ox4 Attention: Sustained Sustained Attention: Appears intact Memory: Appears intact Awareness: Appears intact Safety/Judgment: Appears intact    Extremity Assessment (includes Sensation/Coordination)  Upper Extremity Assessment: RUE deficits/detail RUE Deficits / Details: overall flaccid UE, pt with minimal shoulder shrug noted, difficult to fully assess sensation but appears intact RUE Coordination: decreased fine motor, decreased gross motor  Lower Extremity Assessment: Overall WFL for tasks assessed    ADLs  Overall ADL's : Needs assistance/impaired Eating/Feeding: Set up, Minimal assistance, Sitting Grooming: Minimal assistance, Sitting Upper Body Bathing: Moderate assistance, Sitting Lower Body Bathing: Moderate assistance, +2 for physical assistance, +2 for safety/equipment, Sit to/from stand Upper Body Dressing : Moderate  assistance, Sitting Lower Body Dressing: Maximal assistance, +2 for physical assistance, +2 for safety/equipment, Sit to/from stand Lower Body Dressing Details (indicate cue type and reason): donning new socks and mesh underwear Toilet Transfer: Moderate assistance, +2 for physical assistance, +2 for safety/equipment, Ambulation, BSC Toilet Transfer Details (indicate cue type and reason): mobility with HHA, use of BSC in room due to urgency  Toileting- Clothing Manipulation and Hygiene: Maximal assistance, +2 for physical assistance, +2 for safety/equipment, Sit to/from stand Toileting - Clothing Manipulation Details (indicate cue type and reason): RN assisting with pericare in standing while therapists assist with standing balance Functional mobility during ADLs: Moderate assistance, +2 for physical assistance, +2 for safety/equipment(HHA) General ADL Comments: pt with decreased sitting/standing balance, RUE deficits, ?impaired cognition (vs language barrier)    Mobility  Overal bed mobility: Needs Assistance Bed Mobility: Supine to Sit Supine to sit: HOB elevated, Mod assist General bed mobility comments: VCs for technique and to initiate movements, assist for trunk elevation and guiding BLE's off edge of bed, increased time/effort    Transfers  Overall transfer level: Needs assistance Equipment used: 2 person hand held assist Transfers: Sit to/from Stand Sit to Stand: +2 physical assistance, +2 safety/equipment, Min assist General transfer comment: Min assist to rise and steady in standing, requires RUE support     Ambulation / Gait / Stairs / Wheelchair Mobility  Ambulation/Gait Ambulation/Gait assistance: Mod assist, +2 physical assistance, +2 safety/equipment Gait Distance (Feet): 30 Feet Assistive device: 2 person hand held assist Gait Pattern/deviations: Step-to pattern, Decreased stride length, Shuffle, Narrow base of support General Gait Details: Pt with slow, cautious,  shuffling gait pattern. Requiring modA +2 for stability, RUE supported, and increased reliance through handheld support on left. Gait velocity: decreased Gait velocity interpretation: <1.31 ft/sec, indicative of household ambulator    Posture / Balance Balance Overall balance assessment: Needs assistance Sitting-balance support: Feet supported Sitting balance-Leahy Scale: Fair Standing balance support: Single extremity supported, Bilateral upper extremity supported Standing balance-Leahy Scale: Poor Standing balance comment: reliant on external assist    Special needs/care consideration BiPAP/CPAP no CPM no Continuous Drip IV no Dialysis no        Days n/a Life Vest no Oxygen no Special Bed no Trach Size no Wound Vac (area) no      Location n/a Skin intact  Location  Bowel mgmt: continent Bladder mgmt: incontinent Diabetic mgmt: no Behavioral consideration no Chemo/radiation no   Previous Home Environment (from acute therapy documentation) Living Arrangements: Children(daughter)  Lives With: Daughter Available Help at Discharge: Family, Available PRN/intermittently(reports daughter works) Type of Home: House Home Layout: Two level, Able to live on main level with bedroom/bathroom Home Access: Level entry(?per pt) Home Care Services: No Additional Comments: home setup obtained from pt however pt with difficulty answering some questions given some language barrier  Discharge Living Setting Plans for Discharge Living Setting: Patient's home, Lives with (comment)(daughter, son in law, and grand children) Type of Home at Discharge: House Discharge Home Layout: Two level, Able to live on main level with bedroom/bathroom Discharge Home Access: Level entry Discharge Bathroom Shower/Tub: Tub/shower unit Discharge Bathroom Toilet: Standard Discharge Bathroom Accessibility: Yes How Accessible: Accessible via walker Does the patient have any problems  obtaining your medications?: No  Social/Family/Support Systems Anticipated Caregiver: daughter [name]) Anticipated Caregiver's Contact Information: [phone] Ability/Limitations of Caregiver: daughter works but son in law is available during the day Caregiver Availability: 24/7 Discharge Plan Discussed with Primary Caregiver: Yes Is Caregiver In Agreement with Plan?: Yes Does Caregiver/Family have Issues with Lodging/Transportation while Pt is in Rehab?: No  Goals/Additional Needs Patient/Family Goal for Rehab: PT/OT supervision to mod I, SLP n/a Expected length of stay: 10-14 days Cultural Considerations: pt from the [place], has lived in the [place] >10 years Dietary Needs: reg/thin Additional Information: pt does not need an interpreter Pt/Family Agrees to Admission and willing to participate: Yes Program Orientation Provided & Reviewed with Pt/Caregiver Including Roles  & Responsibilities: Yes  Decrease burden of Care through IP rehab admission: n/a  Possible need for SNF placement upon discharge: not anticipated  Patient Condition: I have reviewed medical records from [facility], spoken with CSW, and patient and daughter. I met with patient at the bedside for inpatient rehabilitation assessment.  Patient will benefit from ongoing PT and OT, can actively participate in 3 hours of therapy a day 5 days of the week, and can make measurable gains during the admission.  Patient will also benefit from the coordinated team approach during an Inpatient Acute Rehabilitation admission.  The patient will receive intensive therapy as well as Rehabilitation physician, nursing, social worker, and care management interventions.  Due to bladder management, safety, disease management, medication administration, pain management and patient education the patient requires 24 hour a day rehabilitation nursing.  The patient is currently min to mod +2 with mobility and basic ADLs.  Discharge  setting and therapy post discharge at home is anticipated.  Patient has agreed to participate in the Acute Inpatient Rehabilitation Program and will admit today.  Preadmission Screen Completed By:  Michel Santee, PT, DPT 11/16/2019 1:55 PM ______________________________________________________________________   Discussed status with Dr. Letta Pate on 11/16/19  at 1:55 PM  and received approval for admission today.  Admission Coordinator:  Michel Santee, PT, DPT time 1:55 PM Sudie Grumbling 11/16/19    Assessment/Plan: Diagnosis:Left frontal MCA distribution infarct  1. Does the need for close, 24 hr/day Medical supervision in concert with the patient's rehab needs make it unreasonable for this patient to be served in a less intensive setting? Yes 2. Co-Morbidities requiring supervision/potential complications: Asthma, CKD 3, cardiac arrhythmia 3. Due to bladder management, bowel management, safety, skin/wound care, disease management, medication administration, pain management and patient education, does the patient require 24 hr/day rehab nursing? Yes 4. Does the patient require coordinated care of a physician,  rehab nurse, PT, OT, and SLP to address physical and functional deficits in the context of the above medical diagnosis(es)? Yes Addressing deficits in the following areas: balance, endurance, locomotion, strength, transferring, bowel/bladder control, bathing, dressing, feeding, grooming, toileting, cognition, language, swallowing and psychosocial support 5. Can the patient actively participate in an intensive therapy program of at least 3 hrs of therapy 5 days a week? Yes 6. The potential for patient to make measurable gains while on inpatient rehab is good 7. Anticipated functional outcomes upon discharge from inpatient rehab: supervision PT, supervision OT, modified independent SLP 8. Estimated rehab length of stay to reach the above functional goals is: 10 to 14 days 9. Anticipated discharge  destination: Home 10. Overall Rehab/Functional Prognosis: good   MD Signature: Charlett Blake M.D. [facility] FAAPM&R (Neuromuscular Med) Diplomate Am Board of Electrodiagnostic Med Fellow Am Board of Interventional Pain

## 2019-11-16 NOTE — TOC Transition Note (Signed)
Transition of Care [facility]) - CM/SW Discharge Note   Patient Details  Name: Denise Leon MRN: [id number] Date of Birth: 06/16/1946  [facility] CM/SW Contact:  Kermit Balo, RN Phone Number: 11/16/2019, 3:26 PM   Clinical Narrative:    Pt is discharging to CIR today. CM signing off.    Final next level of care: IP Rehab Facility Barriers to Discharge: No Barriers Identified   Patient Goals and CMS Choice        Discharge Placement                       Discharge Plan and Services                                     Social Determinants of Health (SDOH) Interventions     Readmission Risk Interventions No flowsheet data found.

## 2019-11-16 NOTE — Progress Notes (Signed)
Initial Nutrition Assessment  DOCUMENTATION CODES:   Not applicable  INTERVENTION:   Ensure Enlive po TID, each supplement provides 350 kcal and 20 grams of protein  MVI with minerals daily  NUTRITION DIAGNOSIS:   Inadequate oral intake related to poor appetite as evidenced by per patient/family report.   GOAL:   Patient will meet greater than or equal to 90% of their needs    MONITOR:   PO intake, Supplement acceptance, Weight trends, Labs  REASON FOR ASSESSMENT:   Malnutrition Screening Tool    ASSESSMENT:   Pt with a PMH significant for HTN and asthma who presented with right-sided upper extremity plegia, lower extremity paresis on the right, and R facial weakness. Pt noted to have acute ischemic stroke and CKD stage 3.  RN reports pt consuming Ensure Enlive well.   Pt states her appetite is "hit or miss." RD observed lunch meal tray, pt consumed ~25% at time of visit (still eating). Pt reports enjoying Ensure Enlive and is agreeable to continue receiving these supplements. Pt reports consuming these even when her appetite is poor.   Pt denied wt loss PTA.   Diet Order: DYS 3, thin liquids PO intake: 20-100% x 4 recorded meals (45% average meal intake)  Medications reviewed and include: Ensure Enlive BID  Labs reviewed: BUN 26 (L), Cr 1.45 (L)  UOP: x24 hours I/O: +%35.43mL since admit   NUTRITION - FOCUSED PHYSICAL EXAM:    Most Recent Value  Orbital Region  No depletion  Upper Arm Region  Mild depletion  Thoracic and Lumbar Region  No depletion  Buccal Region  Mild depletion  Temple Region  Mild depletion  Clavicle Bone Region  Mild depletion  Clavicle and Acromion Bone Region  Mild depletion  Scapular Bone Region  No depletion  Dorsal Hand  Mild depletion  Patellar Region  Mild depletion  Anterior Thigh Region  Mild depletion  Posterior Calf Region  Mild depletion  Edema (RD Assessment)  None  Hair  Reviewed  Eyes  Reviewed  Mouth   Reviewed  Skin  Reviewed  Nails  Reviewed       Diet Order:   Diet Order            DIET DYS 3 Room service appropriate? Yes with Assist; Fluid consistency: Thin  Diet effective now              EDUCATION NEEDS:   No education needs have been identified at this time  Skin:  Skin Assessment: Reviewed RN Assessment  Last BM:  11/15/19  Height:   Ht Readings from Last 1 Encounters:  11/14/19 5\' 2"  (1.575 m)    Weight:   Wt Readings from Last 1 Encounters:  11/14/19 61.9 kg    Ideal Body Weight:  50 kg  BMI:  Body mass index is 24.96 kg/m.  Estimated Nutritional Needs:   Kcal:  1400-1600  Protein:  85-100 grams  Fluid:  >1.4L/d    11/16/19, MS, RD, LDN Pager: [phone] Weekend/After Hours Pager: [phone]

## 2019-11-16 NOTE — Progress Notes (Signed)
  Speech Language Pathology Treatment: Dysphagia;Cognitive-Linquistic  Patient Details Name: Denise Leon MRN: [id number] DOB: 1946/06/23 Today's Date: 11/16/2019 Time: 1120-1201 SLP Time Calculation (min) (ACUTE ONLY): 41 min  Assessment / Plan / Recommendation Clinical Impression  Patient seen at bedside for skilled speech therapy targeting dysphagia and diagnostic treatment for cognitive linguistic skills. [employer] Tagalog interpreter [phone] utilized.   Patient presents with cognitive-linguistic skills that are grossly [facility] based on clinical presentation today. 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 Pt with min-mild right facial droop. Patient oriented x4, evidences good insight and awareness. She presents with expressive and receptive language skills that are [facility]. Pt able to answer yes/no questions, name items in confrontation naming task without difficulty. She does not present with dysarthria, speech is fluent and without evidence of apraxia. She denies word finding difficulty. Informally, she was able to name items in delayed recall task without difficulty. ST will sign off for speech/language as patient appears to be at her functional baseline.   Patient seen for dysphagia treatment. She has both lower and upper dentures present. Patient seen with thin liquids via self administered cup sips: no s/sx overt aspiration. Pt seen with solid trials with chopped peaches and Nutrigrain bar. Pt with mildly prolonged mastication, adequate bolus formation, swallow initiation appeared timely with good oral clearance. Pt with one instance of delayed cough following the swallow of nutrigrain bar. No other overt s/sx aspiration with other solid trials. Pt noted to be taking somewhat large bites, able to follow verbal cues to slow rate and reduce bite size.  D/W patient ensued, patient reports she has a preference for dysphagia 3 solids  at this time as they are easier to masticate. ST to follow briefly for diet tolerance, compensatory strategy education and diet tolerance for upgrade.     HPI HPI: Denise Leon is a 74 y.o. female past medical history of asthma and hypertension, presented to [facility] for sudden onset of right arm weakness and right facial weakness. TPA was administered early morning on 11/14/19.  Head CT on 11/14/19 was "suspicious for early/developing acute left MCA territory ischemia given provided symptoms." Awaiting MRI.       SLP Plan  Continue with current plan of care       Recommendations  Diet recommendations: Dysphagia 3 (mechanical soft);Thin liquid Liquids provided via: Cup;Straw Medication Administration: Whole meds with puree Supervision: Intermittent supervision to cue for compensatory strategies Compensations: Slow rate;Small sips/bites Postural Changes and/or Swallow Maneuvers: Seated upright 90 degrees                Oral Care Recommendations: Oral care BID SLP Visit Diagnosis: Dysphagia, unspecified (R13.10);Cognitive communication deficit (R41.841) Plan: Continue with current plan of care       GO              Shella Spearing, M.Ed., CCC-SLP Speech Therapy Acute Rehabilitation [phone]: Acute Rehab office [phone] - pager    Shella Spearing 11/16/2019, 12:21 PM

## 2019-11-16 NOTE — Progress Notes (Addendum)
Inpatient Rehab Admissions:  Inpatient Rehab Consult received.  I met with patient at the bedside for rehabilitation assessment and to discuss goals and expectations of an inpatient rehab admission.  She is open to CIR and has good family support at home (confirmed with her daughter).  Awaiting determination on loop recorder versus 30-day monitor and will plan to admit pending medical clearance from Stroke team.    Addedum: I have approval from Burnetta Sabin, NP, for pt to admit to CIR today.  Will let pt/family and CM know.   Signed: Shann Medal, PT, DPT Admissions Coordinator [phone] 11/16/19  1:27 PM

## 2019-11-16 NOTE — Progress Notes (Signed)
PMR Admission Coordinator Pre-Admission Assessment   Patient: Denise Leon is an 74 y.o., female MRN: [id number] DOB: [birth year] Height: 5' 2"  (157.5 cm) Weight: 61.9 kg   Insurance Information HMO:     PPO:      PCP:      IPA:      80/20:      OTHER:  PRIMARY: Medicaid      Policy#: [id number]      Subscriber: patient Coverage Code: MAA Pre-Cert#: verified eligibility online      Employer:  Benefits:  Phone #:      Name:  Eff. Date: active as of 11/16/2019  Deduct:       Out of Pocket Max:       Life Max:  CIR: 100%      SNF:  Outpatient:      Co-Pay:  Home Health:       Co-Pay:  DME:      Co-Pay:  Providers:  SECONDARY:       Policy#:       Subscriber:  CM Name:       Phone#:      Fax#:  Pre-Cert#:       Employer:  Benefits:  Phone #:      Name:  Eff. Date:      Deduct:       Out of Pocket Max:       Life Max:  CIR:       SNF:  Outpatient:      Co-Pay:  Home Health:       Co-Pay:  DME:      Co-Pay:    Medicaid Application Date:       Case Manager:  Disability Application Date:       Case Worker:    The "Data Collection Information Summary" for patients in Inpatient Rehabilitation Facilities with attached "Privacy Act [place] Records" was provided and verbally reviewed with: Patient and Family   Emergency Contact Information         Contact Information     Name Relation Home Work [place], [place] Daughter     [phone]         Current Medical History  Patient Admitting Diagnosis: L MCA CVA   History of Present Illness: Denise Leon is a 74 year old right-handed female with history of asthma, CKD stage III, hip and neck surgery as well as hypertension.  Presented 11/14/2019 with headache, right side weakness and facial droop.  Cranial CT scan showed question subtle loss of gray-white matter differentiation involving the posterior left frontal region suspicious for early developing acute left MCA territory ischemia.  Patient did receive  TPA.  Admission chemistries BUN 28, creatinine 1.49, SARS coronavirus negative, urinalysis negative nitrite.  CT angiogram of head and neck negative for large vessel occlusion.  There was an incidental finding of a 19 mm left thyroid nodule advised follow-up outpatient.  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  Echocardiogram with ejection fraction of 60% without emboli.  Neurology follow-up maintained on aspirin 325 mg daily and Plavix 75 mg daily.  Lovenox for DVT prophylaxis.  Plan 30 day cardiac event monitor.  Therapy evaluations completed and patient was recommended for a comprehensive rehab program.    Complete NIHSS TOTAL: 3   Patient's medical record from [facility] has been reviewed by the rehabilitation admission coordinator and  physician.   Past Medical History      Past Medical History:  Diagnosis Date  . Asthma    . Hypertension        Family History   family history includes Lung disease in her mother.   Prior Rehab/Hospitalizations Has the patient had prior rehab or hospitalizations prior to admission? No   Has the patient had major surgery during 100 days prior to admission? No               Current Medications   Current Facility-Administered Medications:  .   stroke: mapping our early stages of recovery book, , Does not apply, Once, Amie Portland, MD .  acetaminophen (TYLENOL) tablet 650 mg, 650 mg, Oral, Q4H PRN, 650 mg at 11/14/19 2100 **OR** acetaminophen (TYLENOL) 160 MG/5ML solution 650 mg, 650 mg, Per Tube, Q4H PRN **OR** acetaminophen (TYLENOL) suppository 650 mg, 650 mg, Rectal, Q4H PRN, Amie Portland, MD .  albuterol (PROVENTIL) (2.5 MG/3ML) 0.083% nebulizer solution 2.5 mg, 2.5 mg, Inhalation, Q6H PRN, Amie Portland, MD .  aspirin EC tablet 325 mg, 325 mg, Oral, Daily, Rosalin Hawking, MD, 325 mg at 11/16/19 0927 .  atorvastatin (LIPITOR)  tablet 80 mg, 80 mg, Oral, Daily, Rosalin Hawking, MD, 80 mg at 11/16/19 0263 .  Chlorhexidine Gluconate Cloth 2 % PADS 6 each, 6 each, Topical, Daily, Amie Portland, MD, 6 each at 11/14/19 0910 .  clopidogrel (PLAVIX) tablet 75 mg, 75 mg, Oral, Daily, Rosalin Hawking, MD, 75 mg at 11/16/19 7858 .  enoxaparin (LOVENOX) injection 30 mg, 30 mg, Subcutaneous, Q24H, Rosalin Hawking, MD, 30 mg at 11/15/19 1939 .  feeding supplement (ENSURE ENLIVE) (ENSURE ENLIVE) liquid 237 mL, 237 mL, Oral, BID BM, Amie Portland, MD, 237 mL at 11/16/19 0927 .  gabapentin (NEURONTIN) capsule 400 mg, 400 mg, Oral, BID, Amie Portland, MD, 400 mg at 11/16/19 8502 .  latanoprost (XALATAN) 0.005 % ophthalmic solution 1 drop, 1 drop, Both Eyes, QHS, Rosalin Hawking, MD, 1 drop at 11/15/19 2106 .  lidocaine (LIDODERM) 5 % 1 patch, 1 patch, Transdermal, Q24H, Amie Portland, MD, 1 patch at 11/16/19 1000 .  MEDLINE mouth rinse, 15 mL, Mouth Rinse, BID, Amie Portland, MD, 15 mL at 11/16/19 0928 .  Melatonin TABS 3 mg, 3 mg, Oral, QHS, Amie Portland, MD, 3 mg at 11/15/19 2106 .  metoprolol succinate (TOPROL-XL) 24 hr tablet 25 mg, 25 mg, Oral, Daily, Rosalin Hawking, MD, 25 mg at 11/16/19 7741 .  Muscle Rub CREA, , Topical, PRN, Amie Portland, MD, 1 application at 28/78/67 1324 .  pantoprazole (PROTONIX) EC tablet 40 mg, 40 mg, Oral, Daily, Rosalin Hawking, MD, 40 mg at 11/16/19 0927 .  senna-docusate (Senokot-S) tablet 1 tablet, 1 tablet, Oral, QHS PRN, Amie Portland, MD   Patients Current Diet:     Diet Order                      DIET DYS 3 Room service appropriate? Yes with Assist; Fluid consistency: Thin  Diet effective now                   Precautions / Restrictions Precautions Precautions: Fall Precaution Comments: flaccid RUE Restrictions Weight Bearing Restrictions: No    Has the patient had 2 or more falls or a fall with injury in the past year? No   Prior Activity Level Community (5-7x/wk): not driving, no AD prior to  admission   Prior Functional Level Self Care: Did the  patient need help bathing, dressing, using the toilet or eating? Independent   Indoor Mobility: Did the patient need assistance with walking from room to room (with or without device)? Independent   Stairs: Did the patient need assistance with internal or external stairs (with or without device)? Independent   Functional Cognition: Did the patient need help planning regular tasks such as shopping or remembering to take medications? Needed some help   Home Assistive Devices / Union Devices/Equipment: Shower chair with back, Environmental consultant (specify type)(four wheeled walker)   Prior Device Use: Indicate devices/aids used by the patient prior to current illness, exacerbation or injury? None of the above   Current Functional Level Cognition   Arousal/Alertness: Awake/alert Overall Cognitive Status: Difficult to assess Difficult to assess due to: Non-English speaking(speaks some english, stratus not available) Orientation Level: Oriented X4 General Comments: pt following basic commands given increased time, intermittently requires rewording of questions, A&Ox4 Attention: Sustained Sustained Attention: Appears intact Memory: Appears intact Awareness: Appears intact Safety/Judgment: Appears intact    Extremity Assessment (includes Sensation/Coordination)   Upper Extremity Assessment: RUE deficits/detail RUE Deficits / Details: overall flaccid UE, pt with minimal shoulder shrug noted, difficult to fully assess sensation but appears intact RUE Coordination: decreased fine motor, decreased gross motor  Lower Extremity Assessment: Overall WFL for tasks assessed     ADLs   Overall ADL's : Needs assistance/impaired Eating/Feeding: Set up, Minimal assistance, Sitting Grooming: Minimal assistance, Sitting Upper Body Bathing: Moderate assistance, Sitting Lower Body Bathing: Moderate assistance, +2 for physical assistance, +2  for safety/equipment, Sit to/from stand Upper Body Dressing : Moderate assistance, Sitting Lower Body Dressing: Maximal assistance, +2 for physical assistance, +2 for safety/equipment, Sit to/from stand Lower Body Dressing Details (indicate cue type and reason): donning new socks and mesh underwear Toilet Transfer: Moderate assistance, +2 for physical assistance, +2 for safety/equipment, Ambulation, BSC Toilet Transfer Details (indicate cue type and reason): mobility with HHA, use of BSC in room due to urgency  Toileting- Clothing Manipulation and Hygiene: Maximal assistance, +2 for physical assistance, +2 for safety/equipment, Sit to/from stand Toileting - Clothing Manipulation Details (indicate cue type and reason): RN assisting with pericare in standing while therapists assist with standing balance Functional mobility during ADLs: Moderate assistance, +2 for physical assistance, +2 for safety/equipment(HHA) General ADL Comments: pt with decreased sitting/standing balance, RUE deficits, ?impaired cognition (vs language barrier)     Mobility   Overal bed mobility: Needs Assistance Bed Mobility: Supine to Sit Supine to sit: HOB elevated, Mod assist General bed mobility comments: VCs for technique and to initiate movements, assist for trunk elevation and guiding BLE's off edge of bed, increased time/effort     Transfers   Overall transfer level: Needs assistance Equipment used: 2 person hand held assist Transfers: Sit to/from Stand Sit to Stand: +2 physical assistance, +2 safety/equipment, Min assist General transfer comment: Min assist to rise and steady in standing, requires RUE support      Ambulation / Gait / Stairs / Wheelchair Mobility   Ambulation/Gait Ambulation/Gait assistance: Mod assist, +2 physical assistance, +2 safety/equipment Gait Distance (Feet): 30 Feet Assistive device: 2 person hand held assist Gait Pattern/deviations: Step-to pattern, Decreased stride length, Shuffle,  Narrow base of support General Gait Details: Pt with slow, cautious, shuffling gait pattern. Requiring modA +2 for stability, RUE supported, and increased reliance through handheld support on left. Gait velocity: decreased Gait velocity interpretation: <1.31 ft/sec, indicative of household ambulator     Posture / Balance Balance Overall balance  assessment: Needs assistance Sitting-balance support: Feet supported Sitting balance-Leahy Scale: Fair Standing balance support: Single extremity supported, Bilateral upper extremity supported Standing balance-Leahy Scale: Poor Standing balance comment: reliant on external assist     Special needs/care consideration BiPAP/CPAP no CPM no Continuous Drip IV no Dialysis no        Days n/a Life Vest no Oxygen no Special Bed no Trach Size no Wound Vac (area) no      Location n/a Skin intact                              Location  Bowel mgmt: continent Bladder mgmt: incontinent Diabetic mgmt: no Behavioral consideration no Chemo/radiation no    Previous Home Environment (from acute therapy documentation) Living Arrangements: Children(daughter)  Lives With: Daughter Available Help at Discharge: Family, Available PRN/intermittently(reports daughter works) Type of Home: House Home Layout: Two level, Able to live on main level with bedroom/bathroom Home Access: Level entry(?per pt) Home Care Services: No Additional Comments: home setup obtained from pt however pt with difficulty answering some questions given some language barrier   Discharge Living Setting Plans for Discharge Living Setting: Patient's home, Lives with (comment)(daughter, son in law, and grand children) Type of Home at Discharge: House Discharge Home Layout: Two level, Able to live on main level with bedroom/bathroom Discharge Home Access: Level entry Discharge Bathroom Shower/Tub: Tub/shower unit Discharge Bathroom Toilet: Standard Discharge Bathroom Accessibility: Yes How  Accessible: Accessible via walker Does the patient have any problems obtaining your medications?: No   Social/Family/Support Systems Anticipated Caregiver: daughter [name]) Anticipated Caregiver's Contact Information: [phone] Ability/Limitations of Caregiver: daughter works but son in law is available during the day Caregiver Availability: 24/7 Discharge Plan Discussed with Primary Caregiver: Yes Is Caregiver In Agreement with Plan?: Yes Does Caregiver/Family have Issues with Lodging/Transportation while Pt is in Rehab?: No   Goals/Additional Needs Patient/Family Goal for Rehab: PT/OT supervision to mod I, SLP n/a Expected length of stay: 10-14 days Cultural Considerations: [place] from [place], has lived in the [place] >10 years Dietary Needs: reg/thin Additional Information: pt does not need an interpreter Pt/Family Agrees to Admission and willing to participate: Yes Program Orientation Provided & Reviewed with Pt/Caregiver Including Roles  & Responsibilities: Yes   Decrease burden of Care through IP rehab admission: n/a   Possible need for SNF placement upon discharge: not anticipated   Patient Condition: I have reviewed medical records from [facility], spoken with CSW, and patient and daughter. I met with patient at the bedside for inpatient rehabilitation assessment.  Patient will benefit from ongoing PT and OT, can actively participate in 3 hours of therapy a day 5 days of the week, and can make measurable gains during the admission.  Patient will also benefit from the coordinated team approach during an Inpatient Acute Rehabilitation admission.  The patient will receive intensive therapy as well as Rehabilitation physician, nursing, social worker, and care management interventions.  Due to bladder management, safety, disease management, medication administration, pain management and patient education the patient requires 24 hour a day rehabilitation nursing.  The  patient is currently min to mod +2 with mobility and basic ADLs.  Discharge setting and therapy post discharge at home is anticipated.  Patient has agreed to participate in the Acute Inpatient Rehabilitation Program and will admit today.   Preadmission Screen Completed By:  Michel Santee, PT, DPT 11/16/2019 1:55 PM ______________________________________________________________________   Discussed status with  Dr. Letta Pate on 11/16/19  at 1:55 PM  and received approval for admission today.   Admission Coordinator:  Michel Santee, PT, DPT time 1:55 PM Sudie Grumbling 11/16/19     Assessment/Plan: Diagnosis:Left frontal MCA distribution infarct  1. Does the need for close, 24 hr/day Medical supervision in concert with the patient's rehab needs make it unreasonable for this patient to be served in a less intensive setting? Yes 2. Co-Morbidities requiring supervision/potential complications: Asthma, CKD 3, cardiac arrhythmia 3. Due to bladder management, bowel management, safety, skin/wound care, disease management, medication administration, pain management and patient education, does the patient require 24 hr/day rehab nursing? Yes 4. Does the patient require coordinated care of a physician, rehab nurse, PT, OT, and SLP to address physical and functional deficits in the context of the above medical diagnosis(es)? Yes Addressing deficits in the following areas: balance, endurance, locomotion, strength, transferring, bowel/bladder control, bathing, dressing, feeding, grooming, toileting, cognition, language, swallowing and psychosocial support 5. Can the patient actively participate in an intensive therapy program of at least 3 hrs of therapy 5 days a week? Yes 6. The potential for patient to make measurable gains while on inpatient rehab is good 7. Anticipated functional outcomes upon discharge from inpatient rehab: supervision PT, supervision OT, modified independent SLP 8. Estimated rehab length of stay to  reach the above functional goals is: 10 to 14 days 9. Anticipated discharge destination: Home 10. Overall Rehab/Functional Prognosis: good     MD Signature: Charlett Blake M.D. [facility] FAAPM&R (Neuromuscular Med) Diplomate Am Board of Electrodiagnostic Med Fellow Am Board of Interventional Pain

## 2019-11-16 NOTE — Progress Notes (Signed)
Occupational Therapy Treatment Patient Details Name: Denise Leon MRN: [id number] DOB: 1946/08/23 Today's Date: 11/16/2019    History of present illness 74 y.o. female past medical history of asthma and HTN who presented to [facility] for sudden onset of right arm weakness and right facial weakness; TPA was administered.  Head CT suspicious for early/developing acute left MCA territory ischemia. MRI revealed patchy small volume acute ischemic infarct involving the posterior left frontal region, with involvement of the pre and postcentral gyri. No associated hemorrhage or mass effect.   OT comments  Pt seen by OT and PT to continue to address established goals. Pt required minA+2 for safety and stability with use of [place] walker for functional mobility. She attempted to complete oral care standing at sink level, but required sitting completion due to reliance of UE support on external surface, instability, and decreased functional use of RUE. Pt with d.c to CIR this date. All acute needs identified and addressed, pt's current level of functioning adequate for d/c to CIR with continued OT services to maximize safety and independence with ADL/IADL and functional mobility. OT signing off.   Follow Up Recommendations  CIR;Supervision/Assistance - 24 hour    Equipment Recommendations  Other (comment)(TBD)    Recommendations for Other Services      Precautions / Restrictions Precautions Precautions: Fall Precaution Comments: flaccid RUE       Mobility Bed Mobility Overal bed mobility: Needs Assistance Bed Mobility: Supine to Sit     Supine to sit: Min assist;HOB elevated     General bed mobility comments: minA to progress trunk to upright posture  Transfers Overall transfer level: Needs assistance Equipment used: [place] walker) Transfers: Sit to/from Stand Sit to Stand: +2 physical assistance;+2 safety/equipment;Min assist         General transfer comment: minA to  powerup into standing    Balance Overall balance assessment: Needs assistance Sitting-balance support: Feet supported Sitting balance-Leahy Scale: Fair     Standing balance support: Single extremity supported;During functional activity Standing balance-Leahy Scale: Poor Standing balance comment: reliant on external support                           ADL either performed or assessed with clinical judgement   ADL Overall ADL's : Needs assistance/impaired     Grooming: Minimal assistance;Wash/dry face;Oral care Grooming Details (indicate cue type and reason): completed at sink level;pt attempted to complete in standing, required sitting for thorough care                 Toilet Transfer: Minimal assistance;+2 for safety/equipment;Cueing for safety;RW;BSC Toilet Transfer Details (indicate cue type and reason): ambulating with eva walker;minA x1 for physical assistance, +2 for safety;used BSC Toileting- Clothing Manipulation and Hygiene: Maximal assistance Toileting - Clothing Manipulation Details (indicate cue type and reason): pt required standing for pericare, maxA for pericare while standing     Functional mobility during ADLs: Minimal assistance;+2 for physical assistance;+2 for safety/equipment;Rolling walker(eva walker) General ADL Comments: limited by decreased activity tolerance, decreased function use of RUE, and instability in standing     Vision       Perception     Praxis      Cognition Arousal/Alertness: Awake/alert Behavior During Therapy: WFL for tasks assessed/performed Overall Cognitive Status: No family/caregiver present to determine baseline cognitive functioning  General Comments: pt following basic commands with increased time;appears to have decreased short term memory, frequent cues for safety         Exercises     Shoulder Instructions       General Comments vss;educated pt on  importance of elevating RUE     Pertinent Vitals/ Pain       Pain Assessment: Faces Faces Pain Scale: Hurts a little bit Pain Location: Rshoulder with PROM to 170*;R foot with weight bearing Pain Descriptors / Indicators: Grimacing Pain Intervention(s): Monitored during session;Limited activity within patient's tolerance  Home Living                                          Prior Functioning/Environment              Frequency  Min 2X/week        Progress Toward Goals  OT Goals(current goals can now be found in the care plan section)  Progress towards OT goals: Progressing toward goals  Acute Rehab OT Goals Patient Stated Goal: to use RUE again OT Goal Formulation: With patient Time For Goal Achievement: 11/29/19 Potential to Achieve Goals: Good ADL Goals Pt Will Perform Grooming: with min guard assist;sitting Pt Will Perform Lower Body Bathing: with min assist;sit to/from stand;sitting/lateral leans Pt Will Perform Upper Body Dressing: with min guard assist;sitting Pt Will Perform Lower Body Dressing: with min assist;sit to/from stand;sitting/lateral leans Pt Will Transfer to Toilet: ambulating;with min assist Pt Will Perform Toileting - Clothing Manipulation and hygiene: with min assist;sit to/from stand;sitting/lateral leans Pt/caregiver will Perform Home Exercise Program: Increased strength;Increased ROM;Right Upper extremity;With written HEP provided;With minimal assist  Plan Discharge plan remains appropriate    Co-evaluation    PT/OT/SLP Co-Evaluation/Treatment: Yes Reason for Co-Treatment: For patient/therapist safety;To address functional/ADL transfers   OT goals addressed during session: ADL's and self-care      AM-PAC OT "6 Clicks" Daily Activity     Outcome Measure   Help from another person eating meals?: A Little Help from another person taking care of personal grooming?: A Little Help from another person toileting, which  includes using toliet, bedpan, or urinal?: A Lot Help from another person bathing (including washing, rinsing, drying)?: A Lot Help from another person to put on and taking off regular upper body clothing?: A Lot Help from another person to put on and taking off regular lower body clothing?: A Lot 6 Click Score: 14    End of Session Equipment Utilized During Treatment: Gait belt(eva walker)  OT Visit Diagnosis: Other abnormalities of gait and mobility (R26.89);Hemiplegia and hemiparesis;Muscle weakness (generalized) (M62.81) Hemiplegia - Right/Left: Right Hemiplegia - dominant/non-dominant: Dominant   Activity Tolerance Patient tolerated treatment well   Patient Left in chair;with call bell/phone within reach;with chair alarm set;with nursing/sitter in room   Nurse Communication Mobility status        Time: 1530-1600 OT Time Calculation (min): 30 min  Charges: OT General Charges $OT Visit: 1 Visit OT Treatments $Self Care/Home Management : 8-22 mins  Diona Browner OTR/L Acute Rehabilitation Services Office: [phone]    Rebeca Alert 11/16/2019, 4:49 PM

## 2019-11-16 NOTE — Discharge Summary (Addendum)
Stroke Discharge Summary  Patient ID: Denise Leon   MRN: [id number]      DOB: [birth year]  Date of Admission: 11/13/2019 Date of Discharge: 11/16/2019  Attending Physician:  Micki Riley, MD, Stroke MD Consultant(s):  Sherryl Manges, MD (electrophysiology)  Patient's PCP:  Patient, No Pcp Per  Discharge Diagnoses:  Principal Problem:   Acute ischemic stroke (HCC) patchy L frontal infarcts s/p tPA cryptogenic etiology Active Problems:   Palpitations   Essential hypertension   Hyperlipidemia   CKD (chronic kidney disease), stage IIIb   Thyroid nodule, L   Medications to be continued on Rehab Allergies as of 11/16/2019   No Known Allergies     Medication List    STOP taking these medications   albuterol 108 (90 Base) MCG/ACT inhaler Commonly known as: VENTOLIN HFA Replaced by: albuterol (2.5 MG/3ML) 0.083% nebulizer solution   amLODipine 5 MG tablet Commonly known as: NORVASC   benazepril 20 MG tablet Commonly known as: LOTENSIN   dorzolamidel-timolol 22.3-6.8 MG/ML Soln ophthalmic solution Commonly known as: COSOPT   meclizine 12.5 MG tablet Commonly known as: ANTIVERT   metoprolol succinate 25 MG 24 hr tablet Commonly known as: TOPROL-XL     TAKE these medications   albuterol (2.5 MG/3ML) 0.083% nebulizer solution Commonly known as: PROVENTIL Inhale 3 mLs (2.5 mg total) into the lungs every 6 (six) hours as needed for wheezing or shortness of breath. Replaces: albuterol 108 (90 Base) MCG/ACT inhaler   aspirin 325 MG EC tablet Take 1 tablet (325 mg total) by mouth daily. Start taking on: November 17, 2019   atorvastatin 80 MG tablet Commonly known as: LIPITOR Take 1 tablet (80 mg total) by mouth daily. Start taking on: November 17, 2019 What changed:   medication strength  how much to take   clopidogrel 75 MG tablet Commonly known as: PLAVIX Take 1 tablet (75 mg total) by mouth daily. Start taking on: November 17, 2019   enoxaparin 30 MG/0.3ML  injection Commonly known as: LOVENOX Inject 0.3 mLs (30 mg total) into the skin daily.   feeding supplement (ENSURE ENLIVE) Liqd Take 237 mLs by mouth 3 (three) times daily between meals.   gabapentin 400 MG capsule Commonly known as: NEURONTIN Take 400 mg by mouth 2 (two) times daily.   latanoprost 0.005 % ophthalmic solution Commonly known as: XALATAN Place 1 drop into both eyes at bedtime.   lidocaine 5 % Commonly known as: LIDODERM Place 1 patch onto the skin daily. Remove & Discard patch within 12 hours or as directed by MD Start taking on: November 17, 2019   Melatonin 3 MG Tabs Take 1 tablet (3 mg total) by mouth at bedtime. What changed:   medication strength  how much to take   multivitamin with minerals Tabs tablet Take 1 tablet by mouth daily.   Muscle Rub 10-15 % Crea Apply 1 application topically as needed for muscle pain.       LABORATORY STUDIES CBC    Component Value Date/Time   WBC 9.5 11/16/2019 0203   RBC 3.15 (L) 11/16/2019 0203   HGB 9.8 (L) 11/16/2019 0203   HCT 29.3 (L) 11/16/2019 0203   PLT 267 11/16/2019 0203   MCV 93.0 11/16/2019 0203   MCH 31.1 11/16/2019 0203   MCHC 33.4 11/16/2019 0203   RDW 12.0 11/16/2019 0203   LYMPHSABS 3.9 11/13/2019 2323   MONOABS 1.0 11/13/2019 2323   EOSABS 0.2 11/13/2019 2323   BASOSABS 0.0 11/13/2019 2323  CMP    Component Value Date/Time   NA 141 11/16/2019 0203   K 3.9 11/16/2019 0203   CL 108 11/16/2019 0203   CO2 24 11/16/2019 0203   GLUCOSE 116 (H) 11/16/2019 0203   BUN 26 (H) 11/16/2019 0203   CREATININE 1.45 (H) 11/16/2019 0203   CALCIUM 8.8 (L) 11/16/2019 0203   PROT 7.3 11/13/2019 2323   ALBUMIN 4.1 11/13/2019 2323   AST 23 11/13/2019 2323   ALT 15 11/13/2019 2323   ALKPHOS 41 11/13/2019 2323   BILITOT 1.1 11/13/2019 2323   GFRNONAA 36 (L) 11/16/2019 0203   GFRAA 42 (L) 11/16/2019 0203   COAGS Lab Results  Component Value Date   INR 0.9 11/13/2019   Lipid Panel     Component Value Date/Time   CHOL 235 (H) 11/14/2019 0926   TRIG 171 (H) 11/14/2019 0926   HDL 45 11/14/2019 0926   CHOLHDL 5.2 11/14/2019 0926   VLDL 34 11/14/2019 0926   LDLCALC 156 (H) 11/14/2019 0926   HgbA1C  Lab Results  Component Value Date   HGBA1C 5.7 (H) 11/14/2019   Urinalysis    Component Value Date/Time   COLORURINE YELLOW 11/14/2019 0130   APPEARANCEUR CLEAR 11/14/2019 0130   LABSPEC 1.010 11/14/2019 0130   PHURINE 5.5 11/14/2019 0130   GLUCOSEU NEGATIVE 11/14/2019 0130   HGBUR NEGATIVE 11/14/2019 0130   BILIRUBINUR NEGATIVE 11/14/2019 0130   KETONESUR NEGATIVE 11/14/2019 0130   PROTEINUR NEGATIVE 11/14/2019 0130   NITRITE NEGATIVE 11/14/2019 0130   LEUKOCYTESUR NEGATIVE 11/14/2019 0130   Urine Drug Screen     Component Value Date/Time   LABOPIA NONE DETECTED 11/14/2019 0130   COCAINSCRNUR NONE DETECTED 11/14/2019 0130   LABBENZ NONE DETECTED 11/14/2019 0130   AMPHETMU NONE DETECTED 11/14/2019 0130   THCU NONE DETECTED 11/14/2019 0130   LABBARB NONE DETECTED 11/14/2019 0130    Alcohol Level    Component Value Date/Time   ETH <10 11/13/2019 2323    SIGNIFICANT DIAGNOSTIC STUDIES  CT HEAD CODE STROKE WO CONTRAST 11/14/2019 IMPRESSION:  1. Question subtle loss of gray-white matter differentiation involving the posterior left frontal region, suspicious for early/developing acute left MCA territory ischemia given provided symptoms. No hyperdense vessel or intracranial hemorrhage.  2. ASPECTS is 9.  3. Age-related cerebral atrophy with moderate chronic microvascular ischemic disease.   CT Angio Head W or Wo Contrast CT ANGIO NECK W OR WO CONTRAST 11/14/2019 IMPRESSION:  1. Negative CTA for large vessel occlusion.  2. Mixed atheromatous plaque at the origin of the left ICA with short-segment mild 30% stenosis.  3. Moderate atherosclerotic change about the aortic arch, with more mild atherosclerotic change about the carotid siphons. No other  hemodynamically significant or correctable stenosis identified.  4. 19 mm left thyroid nodule, indeterminate. Further evaluation with dedicated thyroid ultrasound recommended. This could be performed on a nonemergent outpatient basis.   MR BRAIN WO CONTRAST 11/15/2019 IMPRESSION:  1. Patchy small volume acute ischemic infarct involving the posterior left frontal region, with involvement of the pre and postcentral gyri. No associated hemorrhage or mass effect.  2. Underlying age-related cerebral atrophy with moderate to advanced chronic microvascular ischemic disease.   ECHOCARDIOGRAM COMPLETE 11/14/2019 IMPRESSIONS   1. Left ventricular ejection fraction, by visual estimation, is 55 to 60%. The left ventricle has normal function. There is no left ventricular hypertrophy.   2. Left ventricular diastolic parameters are consistent with Grade I diastolic dysfunction (impaired relaxation).   3. The left ventricle has no regional wall  motion abnormalities.   4. Global right ventricle has normal systolic function.The right ventricular size is normal. No increase in right ventricular wall thickness.   5. Left atrial size was normal.   6. Right atrial size was normal.   7. The mitral valve is normal in structure. No evidence of mitral valve regurgitation.   8. The tricuspid valve is normal in structure.   9. The tricuspid valve is normal in structure. Tricuspid valve regurgitation is trivial.  10. The aortic valve is tricuspid. Aortic valve regurgitation is not visualized. Mild aortic valve sclerosis without stenosis.  11. Pulmonic regurgitation is mild.  12. The pulmonic valve was normal in structure. Pulmonic valve regurgitation is mild.  13. The inferior vena cava is normal in size with greater than 50% respiratory variability, suggesting right atrial pressure of 3 mmHg.   Lower Extremity Dopplers Right: There is no evidence of deep vein thrombosis in the lower extremity. No cystic structure  found in the popliteal fossa.  Left: There is no evidence of deep vein thrombosis in the lower extremity.  No cystic structure found in the popliteal fossa.    ECG - SR rate 98 BPM. (See cardiology reading for complete details)    HISTORY OF PRESENT ILLNESS Denise Leon is a 74 y.o. female past medical history of asthma and hypertension, presented to [facility] for sudden onset of right arm weakness and right facial weakness. She was last known well approximately 9 PM on 11/13/2019, when she took melatonin and went to bed.  She then developed a headache which she described 5 out of 10 to the ED provider over at [facility] followed by inability to move the right upper extremity.  She moved from [place] to live with her daughter here and has been living here since [place] daughter accompanying her at the outside ER provided some history to the ED providers as well as the telemedicine neurology consultant. She was evaluated by telemedicine neurology, deemed to be a candidate for IV TPA for right-sided weakness, TPA was administered and she was transferred to [facility] the following morning. She has a prior history of spinal surgery with some right-sided numbness but no prior history of weakness. Premorbid modified Rankin scale (mRS):0.     HOSPITAL COURSE [name] is a 74 y.o. female with history of asthma, hypertension and spinal surgery with residual right sided numbness presented to the [facility] for sudden onset of right arm weakness and right facial weakness.  She received IV t-PA per tele-neurology at [facility] at 00:05 hours 11/14/2019.   Stroke: Patchy small infarcts at posterior left frontal region - likely embolic - concerning for afib given heart palpitation vs. Left ICA bulb mixed plaques  Code Stroke CT Head - Question subtle loss of gray-white matter differentiation involving the posterior left frontal region,  suspicious for early/developing acute left MCA territory ischemia given provided symptoms.   MRI head - Patchy small volume acute ischemic infarct involving the posterior left frontal region, with involvement of the pre and postcentral gyri.  CTA H&N - Mixed atheromatous plaque at the origin of the left ICA with short-segment mild 30% stenosis.   2D Echo - EF 60 - 65%. No cardiac source of emboli identified.   LE venous doppler negative   Recommend loop recorder to rule out afib if above work up unrevealing->EP cardiology consulted. They recommend OP 30d monitor given long hx palpitations.  Sars Corona Virus 2 - negative  LDL - 156  HgbA1c - 5.7  UDS - negative  VTE prophylaxis - lovenox  No antithrombotic prior to admission, now on aspirin 325 mg daily and clopidogrel 75 mg daily due to left ICA bulb mixed plaques. continue x 3 months then aspirin alone.   Therapy recommendations:  CIR   Disposition:  CIR  Heart palpitation  Has been for year  Average 2/week  No specific triggers  Was on metoprolol intermittently when she was in [place]  Recommend loop recorder to rule out afib if above work up unrevealing->EP cardiology consulted. They recommend OP 30d monitor given long hx palpitations.    Hypertension  Home BP meds: Norvasc, metoprolol, lotensin  Current BP meds: metoprolol  Stable  Permissive hypertension (OK if < 180/105) but gradually normalize in 3-5 days post stroke  Long-term BP goal normotensive  Hyperlipidemia  Home Lipid lowering medication: Lipitor 20 mg daily  LDL 156, goal < 70  Current lipid lowering medication: Lipitor 80 mg daily   Continue statin at discharge  Other Stroke Risk Factors  Advanced age  Other Active Problems  CKD - stage 3b creatinine 2.00-1.49-1.48 - on diet, encourage po, off IVF  19 mm left thyroid nodule, indeterminate. Further evaluation with dedicated thyroid ultrasound recommended. This could be  performed on a nonemergent outpatient basis.  DISCHARGE EXAM Blood pressure (!) 158/77, pulse 75, temperature 98.3 F (36.8 C), temperature source Oral, resp. rate 20, height 5\' 2"  (1.575 m), weight 61.9 kg, SpO2 96 %. General - Well nourished, well developed, in no apparent distress.  Ophthalmologic - fundi not visualized due to noncooperation.  Cardiovascular - Regular rhythm and rate.  Mental Status -  Level of arousal and orientation to time, place, and person were intact. Language including expression, naming, repetition, comprehension was assessed and found intact. Fund of Knowledge was assessed and was intact.  Cranial Nerves II - XII - II - Visual field intact OU. III, IV, VI - Extraocular movements intact. V - Facial sensation intact bilaterally. VII - mild right facial droop. VIII - Hearing & vestibular intact bilaterally. X - Palate elevates symmetrically. XI - Chin turning & shoulder shrug intact bilaterally. XII - Tongue protrusion intact.  Motor Strength - The patient's strength was normal in left UE and LE, however, RUE proximal 1/5, bicep 1/5, tricep 2/5, distal 0/5. RLE 4/5.  Bulk was normal and fasciculations were absent.   Motor Tone - Muscle tone was assessed at the neck and appendages and was normal.  Reflexes - The patient's reflexes were decreased on the right and she had no pathological reflexes.  Sensory - Light touch, temperature/pinprick were assessed and were symmetrical.    Coordination - The patient had normal movements in the left hand with no ataxia or dysmetria.  Tremor was absent.  Gait and Station - deferred.  Discharge Diet  Dysphagia 3 thin liquids  DISCHARGE PLAN  Disposition:  Transfer to [facility] for ongoing PT, OT and ST  aspirin 325 mg daily and clopidogrel 75 mg daily for secondary stroke prevention for 3 months then aspirin alone.  Recommend ongoing stroke risk factor control by Primary Care  Physician at time of discharge from inpatient rehabilitation.  Follow-up PCP Patient, No Pcp Per in 2 weeks following discharge from rehab.  OP follow up with PCP to further evaluate thyroid nodule  Follow-up in [facility] in 4 weeks following discharge from rehab, office to  schedule an appointment.   35 minutes were spent preparing discharge.  Burnetta Sabin, MSN, APRN, ANVP-BC, AGPCNP-BC Advanced Practice Stroke Nurse [place] for Schedule & Pager information 11/16/2019 3:50 PM  I have personally obtained history,examined this patient, reviewed notes, independently viewed imaging studies, participated in medical decision making and plan of care.ROS completed by me personally and pertinent positives fully documented  I have made any additions or clarifications directly to the above note. Agree with note above.   Antony Contras, MD Medical Director [place] Pager: [phone] 11/16/2019 4:04 PM

## 2019-11-16 NOTE — Consult Note (Addendum)
ELECTROPHYSIOLOGY CONSULT NOTE  Patient ID: Denise Leon MRN: [id number], DOB/AGE: 12-30-1945   Admit date: 11/13/2019 Date of Consult: 11/16/2019  Primary Physician: Patient, No Pcp Per Primary Cardiologist: none Reason for Consultation: Cryptogenic stroke ; recommendations regarding Implantable Loop Recorder, requested by Dr. Pearlean Brownie  History of Present Illness Denise Leon was admitted on 11/13/2019 with sudden onset of R arm weakness, found with stroke at [facility], via tele-neurology, tPa was initiated and transferred to [facility].      PMHx includes asthma, HTN, prior back surgery  Neurology notes:Patchy small infarcts at posterior left frontal region - likely embolic - concerning for afib given heart palpitation vs. Left ICA bulb mixed plaques .  she has undergone workup for stroke including echocardiogram and carotid angio.  The patient has been monitored on telemetry which has demonstrated sinus rhythm with no arrhythmias.  Neurology team has deferred TEE  Echocardiogram this admission demonstrated IMPRESSIONS  1. Left ventricular ejection fraction, by visual estimation, is 55 to  60%. The left ventricle has normal function. There is no left ventricular  hypertrophy.  2. Left ventricular diastolic parameters are consistent with Grade I  diastolic dysfunction (impaired relaxation).  3. The left ventricle has no regional wall motion abnormalities.  4. Global right ventricle has normal systolic function.The right  ventricular size is normal. No increase in right ventricular wall  thickness.  5. Left atrial size was normal.  6. Right atrial size was normal.  7. The mitral valve is normal in structure. No evidence of mitral valve  regurgitation.  8. The tricuspid valve is normal in structure.  9. The tricuspid valve is normal in structure. Tricuspid valve  regurgitation is trivial.  10. The aortic valve is tricuspid. Aortic valve regurgitation is not  visualized. Mild aortic  valve sclerosis without stenosis.  11. Pulmonic regurgitation is mild.  12. The pulmonic valve was normal in structure. Pulmonic valve  regurgitation is mild.  13. The inferior vena cava is normal in size with greater than 50%  respiratory variability, suggesting right atrial pressure of 3 mmHg.     Lab work is reviewed.   [place] though she tells me she speaks and understands [place], living in [place] for 10 years moving here about a year ago. 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  Prior to admission, the patient denies chest pain, shortness of breath, dizziness, syncope.  She does though have a long history of palpitations. She mentions that her doctor in [place] gave her metoprolol that she would uses for the palpitations.  These are a sensation of her heart beating fast, happened as often as 2x week sometimes less, though feels like in a month she would usually feel her heart fast a couple times at least. She has never hear d the term Atrial fibrillation or Afib.  They are recovering from their stroke with plans to rehab at discharge.    Past Medical History:  Diagnosis Date  . Asthma   . Hypertension      Surgical History:  Past Surgical History:  Procedure Laterality Date  . HIP SURGERY    . NECK SURGERY       Medications Prior to Admission  Medication Sig Dispense Refill Last Dose  . albuterol (VENTOLIN HFA) 108 (90 Base) MCG/ACT inhaler Inhale 2 puffs into the lungs every 6 (six) hours as needed for wheezing or shortness of breath.    Past  Week at Unknown time  . amLODipine (NORVASC) 5 MG tablet Take 5 mg by mouth daily.   11/13/2019 at Unknown time  . atorvastatin (LIPITOR) 20 MG tablet Take 20 mg by mouth daily.   11/12/2019  . benazepril (LOTENSIN) 20 MG tablet Take 20 mg by mouth daily.   11/13/2019 at Unknown time  . dorzolamidel-timolol (COSOPT) 22.3-6.8 MG/ML SOLN ophthalmic  solution Place 1 drop into both eyes 2 (two) times daily.   11/12/2019  . gabapentin (NEURONTIN) 400 MG capsule Take 400 mg by mouth 2 (two) times daily.   11/12/2019  . latanoprost (XALATAN) 0.005 % ophthalmic solution Place 1 drop into both eyes at bedtime.   11/12/2019  . lidocaine (LIDODERM) 5 % Place 1 patch onto the skin daily. Remove & Discard patch within 12 hours or as directed by MD   11/13/2019 at Unknown time  . meclizine (ANTIVERT) 12.5 MG tablet Take 12.5 mg by mouth 3 (three) times daily as needed for dizziness.   unk  . Melatonin 5 MG TABS Take 5 mg by mouth at bedtime.   11/13/2019 at Unknown time  . metoprolol succinate (TOPROL-XL) 25 MG 24 hr tablet Take 25 mg by mouth daily.   11/12/2019 at 1500    Inpatient Medications:  .  stroke: mapping our early stages of recovery book   Does not apply Once  . aspirin EC  325 mg Oral Daily  . atorvastatin  80 mg Oral Daily  . Chlorhexidine Gluconate Cloth  6 each Topical Daily  . clopidogrel  75 mg Oral Daily  . enoxaparin (LOVENOX) injection  30 mg Subcutaneous Q24H  . feeding supplement (ENSURE ENLIVE)  237 mL Oral BID BM  . gabapentin  400 mg Oral BID  . latanoprost  1 drop Both Eyes QHS  . lidocaine  1 patch Transdermal Q24H  . mouth rinse  15 mL Mouth Rinse BID  . Melatonin  3 mg Oral QHS  . metoprolol succinate  25 mg Oral Daily  . pantoprazole  40 mg Oral Daily    Allergies: No Known Allergies  Social History   Socioeconomic History  . Marital status: Widowed    Spouse name: Not on file  . Number of children: Not on file  . Years of education: Not on file  . Highest education level: Not on file  Occupational History  . Not on file  Tobacco Use  . Smoking status: Never Smoker  . Smokeless tobacco: Never Used  Substance and Sexual Activity  . Alcohol use: Never  . Drug use: Never  . Sexual activity: Not on file  Other Topics Concern  . Not on file  Social History Narrative  . Not on file   Social Determinants  of Health   Financial Resource Strain:   . Difficulty of Paying Living Expenses: Not on file  Food Insecurity:   . Worried About Programme researcher, broadcasting/film/videounning Out of Food in the Last Year: Not on file  . Ran Out of Food in the Last Year: Not on file  Transportation Needs:   . Lack of Transportation (Medical): Not on file  . Lack of Transportation (Non-Medical): Not on file  Physical Activity:   . Days of Exercise per Week: Not on file  . Minutes of Exercise per Session: Not on file  Stress:   . Feeling of Stress : Not on file  Social Connections:   . Frequency of Communication with Friends and Family: Not on file  . Frequency of Social  Gatherings with Friends and Family: Not on file  . Attends Religious Services: Not on file  . Active Member of Clubs or Organizations: Not on file  . Attends Banker Meetings: Not on file  . Marital Status: Not on file  Intimate Partner Violence:   . Fear of Current or Ex-Partner: Not on file  . Emotionally Abused: Not on file  . Physically Abused: Not on file  . Sexually Abused: Not on file     Family History  Problem Relation Age of Onset  . Lung disease Mother       Review of Systems: All other systems reviewed and are otherwise negative except as noted above.  Physical Exam: Vitals:   11/15/19 1956 11/15/19 2342 11/16/19 0408 11/16/19 0827  BP: 104/86 (!) 148/81 127/71 (!) 141/64  Pulse: 68 80 68 73  Resp: 16 20 20 18   Temp: 98.8 F (37.1 C) 98.3 F (36.8 C) 98.1 F (36.7 C) 98.4 F (36.9 C)  TempSrc: Oral Oral Oral Oral  SpO2: 97% 98% 100% 100%  Weight:      Height:        GEN- The patient is well appearing, alert and oriented x 3 today.   Head- normocephalic, atraumatic Eyes-  Sclera clear, conjunctiva pink Ears- hearing intact Oropharynx- clear Neck- supple Lungs- CTA b/l, normal work of breathing Heart- RRR, no murmurs, rubs or gallops  GI- soft, NT, ND Extremities- no clubbing, cyanosis, or edema MS- no significant  deformity or atrophy Skin- no rash or lesion Psych- euthymic mood, full affect   Labs:   Lab Results  Component Value Date   WBC 9.5 11/16/2019   HGB 9.8 (L) 11/16/2019   HCT 29.3 (L) 11/16/2019   MCV 93.0 11/16/2019   PLT 267 11/16/2019    Recent Labs  Lab 11/13/19 2323 11/15/19 0704 11/16/19 0203  NA 136   < > 141  K 3.7   < > 3.9  CL 102   < > 108  CO2 24   < > 24  BUN 28*   < > 26*  CREATININE 1.49*   < > 1.45*  CALCIUM 9.4   < > 8.8*  PROT 7.3  --   --   BILITOT 1.1  --   --   ALKPHOS 41  --   --   ALT 15  --   --   AST 23  --   --   GLUCOSE 133*   < > 116*   < > = values in this interval not displayed.   No results found for: CKTOTAL, CKMB, CKMBINDEX, TROPONINI Lab Results  Component Value Date   CHOL 235 (H) 11/14/2019   Lab Results  Component Value Date   HDL 45 11/14/2019   Lab Results  Component Value Date   LDLCALC 156 (H) 11/14/2019   Lab Results  Component Value Date   TRIG 171 (H) 11/14/2019   Lab Results  Component Value Date   CHOLHDL 5.2 11/14/2019   No results found for: LDLDIRECT  No results found for: DDIMER   Radiology/Studies:   CT Angio Head W or Wo Contrast Result Date: 11/14/2019 CLINICAL DATA:  Initial evaluation for acute right upper extremity weakness. EXAM: CT ANGIOGRAPHY HEAD AND NECK TECHNIQUE: Multidetector CT imaging of the head and neck was performed using the standard protocol during bolus administration of intravenous contrast. Multiplanar CT image reconstructions and MIPs were obtained to evaluate the vascular anatomy. Carotid stenosis measurements (when applicable)  are obtained utilizing NASCET criteria, using the distal internal carotid diameter as the denominator. CONTRAST:  42mL OMNIPAQUE IOHEXOL 350 MG/ML SOLN COMPARISON:  Prior head CT from earlier the same day. FINDINGS: CTA NECK FINDINGS Aortic arch: Visualized aortic arch of normal caliber with normal 3 vessel morphology. Scattered calcified and noncalcified  plaque present within the visualized arch and descending intrathoracic aorta. Scattered areas of multifocal soft plaque and/or thrombus protruding into the aortic lumen (series 7, image 20). No hemodynamically significant stenosis seen about the origin of the great vessels. Visualized subclavian arteries widely patent. Right carotid system: Right CCA widely patent from its origin to the bifurcation without stenosis. Minimal atheromatous plaque about the right bifurcation without stenosis. Right ICA widely patent distally without stenosis, dissection or occlusion. Left carotid system: Left CCA widely patent from its origin to the bifurcation without stenosis. Mixed eccentric plaque seen at the origin of the left ICA with associated mild stenosis of no more than 30% by NASCET criteria. Left ICA patent distally to the skull base without stenosis, dissection, or occlusion. Vertebral arteries: Both vertebral arteries arise from the subclavian arteries. Vertebral arteries widely patent within the neck without stenosis, dissection or occlusion. Skeleton: No acute osseous abnormality. No discrete or worrisome osseous lesions. Patient is edentulous. Advanced cervical spondylosis noted at C5-6 and C6-7. Other neck: No other acute soft tissue abnormality within the neck. No adenopathy. Enlarged left lobe of thyroid with a few scattered nodules present, largest of which measures 19 mm (series 5, image 29). Right lobe of thyroid absent. Upper chest: Visualized upper chest demonstrates no acute abnormality. Review of the MIP images confirms the above findings CTA HEAD FINDINGS Anterior circulation: Petrous segments widely patent bilaterally. Mild scattered atheromatous plaque within the carotid siphons without hemodynamically significant stenosis. A1 segments widely patent. Normal anterior communicating artery. Anterior cerebral arteries patent to their distal aspects without stenosis. No M1 stenosis or occlusion. Negative MCA  bifurcations. Distal MCA branches well perfused and symmetric. Posterior circulation: Right vertebral artery widely patent to the vertebrobasilar junction. Focal plaque within the mid left V4 segment with short-segment mild stenosis. Both posterior inferior cerebral arteries patent bilaterally. Basilar widely patent to its distal aspect without stenosis. Superior cerebral arteries patent bilaterally. Both PCAs well perfused to their distal aspects without stenosis. Small left posterior communicating artery noted. Venous sinuses: Patent. Anatomic variants: None significant. Review of the MIP images confirms the above findings IMPRESSION: 1. Negative CTA for large vessel occlusion. 2. Mixed atheromatous plaque at the origin of the left ICA with short-segment mild 30% stenosis. 3. Moderate atherosclerotic change about the aortic arch, with more mild atherosclerotic change about the carotid siphons. No other hemodynamically significant or correctable stenosis identified. 4. 19 mm left thyroid nodule, indeterminate. Further evaluation with dedicated thyroid ultrasound recommended. This could be performed on a nonemergent outpatient basis. Electronically Signed   By: Rise Mu M.D.   On: 11/14/2019 01:03     DG Chest 1 View Result Date: 11/03/2019 CLINICAL DATA:  Shortness of breath EXAM: CHEST  1 VIEW COMPARISON:  None. FINDINGS: The lung volumes are somewhat low. The heart size is borderline enlarged. Aortic calcifications are noted. There may be some mild vascular congestion without overt pulmonary edema. There are end-stage degenerative changes of both glenohumeral joints, left worse than right. There is no pneumothorax. There is mild blunting of the left costophrenic angle. IMPRESSION: 1. Possible mild vascular congestion without overt pulmonary edema. 2. Possible small left pleural effusion. 3. End-stage degenerative  changes of both glenohumeral joints. Electronically Signed   By: Katherine Mantlehristopher  Green  M.D.   On: 11/03/2019 22:59    CT Angio Chest PE W and/or Wo Contrast Result Date: 11/06/2019 CLINICAL DATA:  Shortness of breath. EXAM: CT ANGIOGRAPHY CHEST WITH CONTRAST TECHNIQUE: Multidetector CT imaging of the chest was performed using the standard protocol during bolus administration of intravenous contrast. Multiplanar CT image reconstructions and MIPs were obtained to evaluate the vascular anatomy. CONTRAST:  59mL OMNIPAQUE IOHEXOL 350 MG/ML SOLN COMPARISON:  None. FINDINGS: Cardiovascular: There is moderate severity calcification atherosclerosis throughout the thoracic aorta. Satisfactory opacification of the pulmonary arteries to the segmental level. No evidence of pulmonary embolism. Normal heart size. No pericardial effusion. Mediastinum/Nodes: No enlarged mediastinal, hilar, or axillary lymph nodes. The left lobe of the thyroid gland is lobulated and heterogeneous in appearance. Lungs/Pleura: Lungs are clear. No pleural effusion or pneumothorax. Upper Abdomen: A 1.4 cm simple cyst is seen within the posteromedial aspect of the upper pole of the right kidney. A 4.1 cm cyst is seen within the upper pole of the left kidney. Musculoskeletal: Multilevel degenerative changes seen throughout the thoracic spine. Noninflamed diverticula are seen within the large bowel. Review of the MIP images confirms the above findings. IMPRESSION: 1. No evidence of pulmonary embolism or acute cardiopulmonary disease. 2. Bilateral simple renal cysts. 3. Colonic diverticulosis. Electronically Signed   By: Aram Candelahaddeus  Houston M.D.   On: 11/06/2019 19:57     MR BRAIN WO CONTRAST Result Date: 11/15/2019 CLINICAL DATA:  Initial evaluation for acute stroke, right upper extremity weakness. EXAM: MRI HEAD WITHOUT CONTRAST TECHNIQUE: Multiplanar, multiecho pulse sequences of the brain and surrounding structures were obtained without intravenous contrast. COMPARISON:  Prior CT and CTA from 11/14/2019. FINDINGS: Brain: Mildly  advanced age-related cerebral atrophy. Patchy T2/FLAIR hyperintensity within the periventricular deep white matter both cerebral hemispheres most consistent with chronic microvascular ischemic disease, moderate to advanced in nature. Few scattered superimposed remote lacunar infarcts present within the hemispheric cerebral white matter. Patchy small volume foci of restricted diffusion seen involving the posterior left frontal region, with involvement of the pre and postcentral gyri (series 5, image 86). No associated hemorrhage or mass effect. No other evidence for acute or subacute ischemia. Gray-white matter differentiation otherwise maintained. No other areas of remote cortical infarction. No foci of susceptibility artifact to suggest acute or chronic intracranial hemorrhage. No mass lesion, midline shift or mass effect. No hydrocephalus. No extra-axial fluid collection. 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 Midline structures intact. Vascular: Major intracranial vascular flow voids are maintained. Skull and upper cervical spine: Craniocervical junction within normal limits. Bone marrow signal intensity normal. No scalp soft tissue abnormality. Sinuses/Orbits: Patient status post bilateral ocular lens replacement. Globes orbital soft tissues demonstrate no acute finding. Mild scattered mucosal thickening noted within the ethmoidal air cells and maxillary sinuses. No significant mastoid effusion. Inner ear structures normal. Other: None. IMPRESSION: 1. Patchy small volume acute ischemic infarct involving the posterior left frontal region, with involvement of the pre and postcentral gyri. No associated hemorrhage or mass effect. 2. Underlying age-related cerebral atrophy with moderate to advanced chronic microvascular ischemic disease. Electronically Signed   By: Rise MuBenjamin  McClintock M.D.   On: 11/15/2019 00:28      CT HEAD CODE STROKE WO CONTRAST Result Date: 11/14/2019 CLINICAL DATA:  Code stroke. EXAM: CT  HEAD WITHOUT CONTRAST TECHNIQUE: Contiguous axial images were obtained from the base of the skull through the vertex without intravenous contrast. COMPARISON:  None.  FINDINGS: Brain: Generalized age-related cerebral atrophy. Patchy hypodensities seen involving the periventricular and deep white matter both cerebral hemispheres most consistent with chronic small vessel ischemic disease. No acute intracranial hemorrhage. There is question of subtle early loss of gray-white matter differentiation involving the supra ganglionic posterior left frontal region (series 2, image 18, suspicious for possible early developing acute left MCA territory infarct. Area of involvement encompasses the left M5 distribution. Otherwise, gray-white matter differentiation maintained. No mass lesion or midline shift. No hydrocephalus. No extra-axial fluid collection. Vascular: No hyperdense vessel. Scattered vascular calcifications noted within the carotid siphons. Skull: Scalp soft tissues and calvarium within normal limits. Calvarium intact. Sinuses/Orbits: Right gaze noted. Globes and orbital soft tissues demonstrate no other paranasal sinuses are clear. No mastoid effusion. Acute finding. Other: None. ASPECTS [facility] Stroke Program Early CT Score) - Ganglionic level infarction (caudate, lentiform nuclei, internal capsule, insula, M1-M3 cortex): 7 - Supraganglionic infarction (M4-M6 cortex): 2 Total score (0-10 with 10 being normal): 9 IMPRESSION: 1. Question subtle loss of gray-white matter differentiation involving the posterior left frontal region, suspicious for early/developing acute left MCA territory ischemia given provided symptoms. No hyperdense vessel or intracranial hemorrhage. 2. ASPECTS is 9. 3. Age-related cerebral atrophy with moderate chronic microvascular ischemic disease. Critical Value/emergent results were called by telephone at the time of interpretation on 11/13/2019 at 11:55 pm to provider [facility] , who  verbally acknowledged these results. Electronically Signed   By: Jeannine Boga M.D.   On: 11/14/2019 00:02     VAS [place] LOWER EXTREMITY VENOUS (DVT) Result Date: 11/16/2019  Lower Venous Study Indications: Stroke.  Anticoagulation: Lovenox. Comparison Study: No prior exam. Performing Technologist: Baldwin Crown ARDMS, RVT  Examination Guidelines: A complete evaluation includes B-mode imaging, spectral Doppler, color Doppler, and power Doppler as needed of all accessible portions of each vessel. Bilateral testing is considered an integral part of a complete examination. Limited examinations for reoccurring indications may be performed as noted.  Summary: Right: There is no evidence of deep vein thrombosis in the lower extremity. No cystic structure found in the popliteal fossa. Left: There is no evidence of deep vein thrombosis in the lower extremity. No cystic structure found in the popliteal fossa.  *See table(s) above for measurements and observations.    Preliminary     12-lead ECG SR All prior EKG's in EPIC reviewed with no documented atrial fibrillation  Telemetry SR, intermittently has times with PACs  Assessment and Plan:  1. Cryptogenic stroke The patient presents with cryptogenic stroke.    I spoke at length with the patient about monitoring for afib with either a 30 day event monitor or an implantable loop recorder.  Risks, benefits, and alteratives to implantable loop recorder were discussed with the patient today.    Given her long history of palpitations that are usually pretty frequent, I think 30 day monitoring initially is appropriate.  I have sent the order for monitoring and will have follow up in our office for her  Dr. Caryl Comes will see later today   Baldwin Jamaica, PA-C 11/16/2019  Palpitations-frequent   Stroke  With frequent palpitations, will use 30 day event recorder prior to implantation of Ling to look for occult atrial fib  She voices  understanding  Discussed with Dr Letta Pate

## 2019-11-16 NOTE — Progress Notes (Signed)
Physical Therapy Treatment Patient Details Name: Denise Leon MRN: [id number] DOB: 04/14/1946 Today's Date: 11/16/2019    History of Present Illness 74 y.o. female past medical history of asthma and HTN who presented to [place] for sudden onset of right arm weakness and right facial weakness; TPA was administered.  Head CT suspicious for early/developing acute left MCA territory ischemia. MRI revealed patchy small volume acute ischemic infarct involving the posterior left frontal region, with involvement of the pre and postcentral gyri. No associated hemorrhage or mass effect.    PT Comments    Pt progressing well, continue to recommend CIR therapies.   Follow Up Recommendations  CIR     Equipment Recommendations  Other (comment)(TBA)    Recommendations for Other Services Rehab consult     Precautions / Restrictions Precautions Precautions: Fall Precaution Comments: flaccid RUE Restrictions Weight Bearing Restrictions: No    Mobility  Bed Mobility Overal bed mobility: Needs Assistance Bed Mobility: Supine to Sit     Supine to sit: Min assist;HOB elevated     General bed mobility comments: minA to progress trunk to upright posture  Transfers Overall transfer level: Needs assistance Equipment used: (EVA walker) Transfers: Sit to/from Stand Sit to Stand: Min assist         General transfer comment: minA to powerup into standing  Ambulation/Gait Ambulation/Gait assistance: Mod assist;+2 physical assistance;+2 safety/equipment Gait Distance (Feet): 70 Feet Assistive device: Rolling walker (2 wheeled)(EVA) Gait Pattern/deviations: Step-to pattern;Decreased stride length;Shuffle;Narrow base of support Gait velocity: decreased   General Gait Details: Pt with slow, cautious, shuffling gait pattern. Requiring modA +2 for stability, RUE supported, better gt distance with B platform.   Stairs             Wheelchair Mobility    Modified  Rankin (Stroke Patients Only) Modified Rankin (Stroke Patients Only) Pre-Morbid Rankin Score: No symptoms Modified Rankin: Moderately severe disability     Balance Overall balance assessment: Needs assistance Sitting-balance support: Feet supported Sitting balance-Leahy Scale: Fair     Standing balance support: Single extremity supported;During functional activity Standing balance-Leahy Scale: Poor Standing balance comment: reliant on external support                            Cognition Arousal/Alertness: Awake/alert Behavior During Therapy: WFL for tasks assessed/performed Overall Cognitive Status: No family/caregiver present to determine baseline cognitive functioning                                 General Comments: pt following basic commands with increased time;appears to have decreased short term memory, frequent cues for safety       Exercises      General Comments        Pertinent Vitals/Pain Pain Assessment: Faces Faces Pain Scale: Hurts a little bit Pain Location: Rshoulder with PROM to 170*;R foot with weight bearing Pain Descriptors / Indicators: Grimacing Pain Intervention(s): Monitored during session;Repositioned    Home Living                      Prior Function            PT Goals (current goals can now be found in the care plan section) Acute Rehab PT Goals Patient Stated Goal: to use RUE again Potential to Achieve Goals: Good Progress towards PT goals: Progressing toward goals  Frequency    Min 4X/week      PT Plan Current plan remains appropriate    Co-evaluation              AM-PAC PT "6 Clicks" Mobility   Outcome Measure  Help needed turning from your back to your side while in a flat bed without using bedrails?: A Little Help needed moving from lying on your back to sitting on the side of a flat bed without using bedrails?: A Little Help needed moving to and from a bed to a chair  (including a wheelchair)?: A Little Help needed standing up from a chair using your arms (e.g., wheelchair or bedside chair)?: A Little Help needed to walk in hospital room?: A Little Help needed climbing 3-5 steps with a railing? : A Little 6 Click Score: 18    End of Session Equipment Utilized During Treatment: Gait belt Activity Tolerance: Patient tolerated treatment well Patient left: in chair;with call bell/phone within reach;with chair alarm set Nurse Communication: Mobility status PT Visit Diagnosis: Unsteadiness on feet (R26.81);Other abnormalities of gait and mobility (R26.89);Difficulty in walking, not elsewhere classified (R26.2);Other symptoms and signs involving the nervous system (R29.898);Hemiplegia and hemiparesis Hemiplegia - Right/Left: Right Hemiplegia - dominant/non-dominant: Dominant Hemiplegia - caused by: Cerebral infarction     Time: 1214-1250 PT Time Calculation (min) (ACUTE ONLY): 36 min  Charges:  $Gait Training: 8-22 mins                     Denise Leon , PTA Acute Rehabilitation Services Pager [phone] Office [phone]     Denise Leon 11/16/2019, 6:22 PM

## 2019-11-16 NOTE — Progress Notes (Signed)
Pt arrived to unit in recliner, pt is A&O x 4 and able to make needs known. NO skin issues noted, has cell phone only, noted to have some difficulty hearing however uses no device. All needs met, call bell in reach.

## 2019-11-16 NOTE — Progress Notes (Signed)
Bilateral lower extremity venous duplex exam completed.  Preliminary results can be found under CV proc under chart review.  11/16/2019 11:43 AM  Liana Camerer, K., RDMS, RVT

## 2019-11-17 ENCOUNTER — Inpatient Hospital Stay (HOSPITAL_COMMUNITY): Payer: Medicaid Other | Admitting: Occupational Therapy

## 2019-11-17 ENCOUNTER — Inpatient Hospital Stay (HOSPITAL_COMMUNITY): Payer: Medicaid Other | Admitting: Physical Therapy

## 2019-11-17 LAB — CBC WITH DIFFERENTIAL/PLATELET
Abs Immature Granulocytes: 0.05 10*3/uL (ref 0.00–0.07)
Basophils Absolute: 0 10*3/uL (ref 0.0–0.1)
Basophils Relative: 0 %
Eosinophils Absolute: 0.2 10*3/uL (ref 0.0–0.5)
Eosinophils Relative: 2 %
HCT: 32.3 % — ABNORMAL LOW (ref 36.0–46.0)
Hemoglobin: 10.8 g/dL — ABNORMAL LOW (ref 12.0–15.0)
Immature Granulocytes: 0 %
Lymphocytes Relative: 21 %
Lymphs Abs: 2.4 10*3/uL (ref 0.7–4.0)
MCH: 31.1 pg (ref 26.0–34.0)
MCHC: 33.4 g/dL (ref 30.0–36.0)
MCV: 93.1 fL (ref 80.0–100.0)
Monocytes Absolute: 1 10*3/uL (ref 0.1–1.0)
Monocytes Relative: 8 %
Neutro Abs: 7.7 10*3/uL (ref 1.7–7.7)
Neutrophils Relative %: 69 %
Platelets: 312 10*3/uL (ref 150–400)
RBC: 3.47 MIL/uL — ABNORMAL LOW (ref 3.87–5.11)
RDW: 12 % (ref 11.5–15.5)
WBC: 11.4 10*3/uL — ABNORMAL HIGH (ref 4.0–10.5)
nRBC: 0 % (ref 0.0–0.2)

## 2019-11-17 LAB — COMPREHENSIVE METABOLIC PANEL
ALT: 12 U/L (ref 0–44)
AST: 16 U/L (ref 15–41)
Albumin: 3.2 g/dL — ABNORMAL LOW (ref 3.5–5.0)
Alkaline Phosphatase: 39 U/L (ref 38–126)
Anion gap: 10 (ref 5–15)
BUN: 25 mg/dL — ABNORMAL HIGH (ref 8–23)
CO2: 25 mmol/L (ref 22–32)
Calcium: 9.1 mg/dL (ref 8.9–10.3)
Chloride: 104 mmol/L (ref 98–111)
Creatinine, Ser: 1.48 mg/dL — ABNORMAL HIGH (ref 0.44–1.00)
GFR calc Af Amer: 41 mL/min — ABNORMAL LOW (ref >60)
GFR calc non Af Amer: 35 mL/min — ABNORMAL LOW (ref >60)
Glucose, Bld: 121 mg/dL — ABNORMAL HIGH (ref 70–99)
Potassium: 4 mmol/L (ref 3.5–5.1)
Sodium: 139 mmol/L (ref 135–145)
Total Bilirubin: 0.9 mg/dL (ref 0.3–1.2)
Total Protein: 6.4 g/dL — ABNORMAL LOW (ref 6.5–8.1)

## 2019-11-17 NOTE — Patient Care Conference (Signed)
Inpatient RehabilitationTeam Conference and Plan of Care Update Date: 11/17/2019   Time: 10:25 AM    Patient Name: Denise Leon      Medical Record Number: [id number]  Date of Birth: 1946/05/28 Sex: Female         Room/Bed: 4W02C/4W02C-01 Payor Info: Payor: MEDICAID [place] / Plan: MEDICAID OF [place] / Product Type: *No Product type* /    Admit Date/Time:  11/16/2019  4:31 PM  Primary Diagnosis:  Acute ischemic stroke [facility])  Patient Active Problem List   Diagnosis Date Noted  . Palpitations 11/16/2019  . Essential hypertension 11/16/2019  . Hyperlipidemia 11/16/2019  . CKD (chronic kidney disease), stage IIIb 11/16/2019  . Thyroid nodule, L 11/16/2019  . 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 Stroke (HCC) 11/14/2019  . Acute ischemic stroke (HCC) patchy L frontal infarcts s/p tPA 11/14/2019    Expected Discharge Date: Expected Discharge Date: (Estimated LOS 2 weeks)  Team Members Present: Physician leading conference: Dr. Faith Rogue Social Worker Present: Amada Jupiter, LCSW(Auria Lula Olszewski, [place]) Nurse Present: Willey Blade, RN Case Manager: Roderic Palau, RN PT Present: Carlynn Purl, PT OT Present: Jake Shark, OT SLP Present: Feliberto Gottron, SLP PPS Coordinator present : Fae Pippin, [place], PT     Current Status/Progress Goal Weekly Team Focus  Bowel/Bladder   Patient is continent of bowel and bladder  Maintain continence  Assist with toileting when patient calls on call light   Swallow/Nutrition/ Hydration   Eval pending         ADL's   Eval Pending         Mobility   min assist transfers, mod assist gait without AD x 8 ft, min assist gait with RW x 50 ft, min assist steps (3") with L rail, decreased endurance  supervision overall with LRAD  R NMR, transfers, balance, gait, stair negotiation, endurance, pt education, awareness   Communication   Eval Pending         Safety/Cognition/ Behavioral Observations  Eval Pending         Pain   Patient states pain 4 out of 10 to RUE and RLE  Maintain pain 3 or less  Assess pain every shift/PRN   Skin   Patient skin intact  Prevent skin breakdown  Assess skin every shift    Rehab Goals Patient on target to meet rehab goals: Yes *See Care Plan and progress notes for long and short-term goals.     Barriers to Discharge  Current Status/Progress Possible Resolutions Date Resolved   Nursing                  PT  Home environment access/layout;Lack of/limited family support  unsure of home setup as pt unable to provide accurate report, unsure if family can provide 24 hr supervision at d/c              OT Other (comments)  language barrier??             SLP                SW                Discharge Planning/Teaching Needs:  New admit - per [facility], plan for pt to return home with daughter and son-in-law to provide 24/7 assistance.  Teaching needs TBD   Team Discussion: L frontal infarct, R hemi, aphasia, R foot pain, orders to ice and apply cream.  RN A[employer] inc on acute, sports cream  to R foot.  OT eval pending.  PT language barrier, refused interpreter, min A overall, Min a walker, S goals.  SLP eval pending.  Used interpreter on acute.  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, right foot pain Weekly Focus/Goal: beginning therapies, right foot pain rx,  Barriers to Discharge: Medical stability       Continued Need for Acute Rehabilitation Level of Care: The patient requires daily medical management by a physician with specialized training in physical medicine and rehabilitation for the following reasons: Direction of a multidisciplinary physical rehabilitation program to maximize functional independence : Yes Medical management of patient stability for increased activity during participation in an intensive rehabilitation regime.: Yes Analysis of laboratory values and/or radiology reports with  any subsequent need for medication adjustment and/or medical intervention. : Yes   I attest that I was present, lead the team conference, and concur with the assessment and plan of the team.   Jodell Cipro M 11/17/2019, 7:26 PM   Team conference was held via web/ teleconference due to [place] - 19

## 2019-11-17 NOTE — Progress Notes (Signed)
Inpatient Rehabilitation  Patient information reviewed and entered into eRehab system by Alece Koppel M. Caeleb Batalla, M.A., CCC/SLP, PPS Coordinator.  Information including medical coding, functional ability and quality indicators will be reviewed and updated through discharge.    

## 2019-11-17 NOTE — Evaluation (Signed)
Physical Therapy Assessment and Plan  Patient Details  Name: Denise Leon MRN: [id number] Date of Birth: 1946/06/06  PT Diagnosis: Abnormal posture, Abnormality of gait, Coordination disorder, Difficulty walking, Hemiparesis dominant, Impaired cognition and Muscle weakness Rehab Potential: Good ELOS: 10-14 days   Today's Date: 11/17/2019 PT Individual Time: 1696-7893 and 1104-1200 PT Individual Time Calculation (min): 71 min and 56 min  Problem List:  Patient Active Problem List   Diagnosis Date Noted  . Palpitations 11/16/2019  . Essential hypertension 11/16/2019  . Hyperlipidemia 11/16/2019  . CKD (chronic kidney disease), stage IIIb 11/16/2019  . Thyroid nodule, L 11/16/2019  . Left middle cerebral artery stroke ([place]) 11/16/2019  . Stroke ([place]) 11/14/2019  . Acute ischemic stroke (HCC) patchy L frontal infarcts s/p tPA 11/14/2019    Past Medical History:  Past Medical History:  Diagnosis Date  . Asthma   . Hypertension    Past Surgical History:  Past Surgical History:  Procedure Laterality Date  . HIP SURGERY    . NECK SURGERY      Assessment & Plan Clinical Impression: Patient is a 74 y.o. year old female with history of asthma, CKD stage III, hip and neck surgery as well as hypertension. Per chart review lives with daughter. Up until October patient had been living alone in [place]. Independent mobility prior to admission. Daughter did assist with some basic ADLs. Presented 11/14/2019 with headache, right side weakness and facial droop. Cranial CT scan showed question subtle loss of gray-white matter differentiation involving the posterior left frontal region suspicious for early developing acute left MCA territory ischemia. Patient did receive TPA. Admission chemistries BUN 28, creatinine 1.49, SARS coronavirus negative, urinalysis negative nitrite. CT angiogram of head and neck negative for large vessel occlusion. There was an incidental finding of a 19 mm  left thyroid nodule advised follow-up outpatient. 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 Echocardiogram with ejection fraction of 60% without emboli. Neurology follow-up maintained on aspirin 325 mg daily and Plavix 75 mg daily. Lovenox for DVT prophylaxis. Plan 30 day cardiac event monitor.. Therapy evaluations completed and patient was admitted for a comprehensive rehab program  Patient has episodes of fluttering in her chest.  She states she has not had it in the hospital but just at home.  She does not get any chest pains or shortness of breath with therapy.  She denies heartburn or indigestion  Discussed placement of 30-day event monitor with electrophysiology.  Patient transferred to CIR on 11/16/2019 .   Patient currently requires min with mobility secondary to muscle weakness, decreased cardiorespiratoy endurance, decreased coordination, decreased memory, and decreased standing balance, decreased postural control, decreased balance strategies and R hemiparesis.  Prior to hospitalization, patient was independent  with mobility and lived with Daughter in a House home.  Home access is  Level entry.  Patient will benefit from skilled PT intervention to maximize safe functional mobility, minimize fall risk and decrease caregiver burden for planned discharge home with 24 hour supervision.  Anticipate patient will HHPT vs OPPT at discharge.  PT - End of Session Activity Tolerance: Tolerates 30+ min activity with multiple rests Endurance Deficit: Yes Endurance Deficit Description: generalized deconditioning PT Assessment Rehab Potential (ACUTE/IP ONLY): Good PT Barriers to Discharge: Home environment access/layout;Lack of/limited family support PT Barriers to Discharge Comments: unsure of home setup as pt unable to provide accurate report, unsure if family can provide 54  hr  supervision at d/c PT Patient demonstrates impairments in the following area(s): Balance;Behavior;Safety;Endurance;Motor;Pain PT Transfers Functional Problem(s): Bed Mobility;Bed to Chair;Car;Furniture;Floor PT Locomotion Functional Problem(s): Ambulation;Wheelchair Mobility;Stairs PT Plan PT Intensity: Minimum of 1-2 x/day ,45 to 90 minutes PT Frequency: 5 out of 7 days PT Duration Estimated Length of Stay: 10-14 days PT Treatment/Interventions: Ambulation/gait training;Community reintegration;DME/adaptive equipment instruction;Neuromuscular re-education;Psychosocial support;Stair training;UE/LE Strength taining/ROM;Wheelchair propulsion/positioning;UE/LE Coordination activities;Therapeutic Activities;Pain management;Skin care/wound management;Functional electrical stimulation;Discharge planning;Cognitive remediation/compensation;Balance/vestibular training;Disease management/prevention;Functional mobility training;Patient/family education;Splinting/orthotics;Therapeutic Exercise;Visual/perceptual remediation/compensation PT Transfers Anticipated Outcome(s): supervision with LRAD PT Locomotion Anticipated Outcome(s): supervision with LRAD, ambulatory PT Recommendation Recommendations for Other Services: Speech consult;Therapeutic Recreation consult Therapeutic Recreation Interventions: Stress management Follow Up Recommendations: 24 hour supervision/assistance(HHPT vs OPPT) Patient destination: Home Equipment Recommended: To be determined  Skilled Therapeutic Intervention Treatment 1: Pt received in recliner & agreeable to tx. Pt denies c/o pain multiple times throughout session. Provided pt with w/c cushion to prevent skin breakdown when OOB, as well as R half lap tray to support RUE. Educated pt on weekly team meetings & daily therapy schedule. Attempted to obtain PLOF & home set up information but pt reports "I don't understand" so information obtained from chart. Discussed with MD & team  during team conference pt may need interpreter, although pt denied this when asked. Pt also appears HOH. Pt ambulates 8 ft recliner>w/c without AD & mod assist with decreased weight shifting R, decreased RLE foot advancement & impaired balance with pt fearful of falling & therapist providing encouragement to attempt. Pt completes w/c<>car transfer at sedan simulated height with min assist via stand pivot, stand pivot w/c<>bed in apartment and supine<>sit & rolling L with min assist with cuing for technique & to attend to RUE, with pt rolling R with supervision. Pt negotiates 8 steps (3") with LUE support & min assist with multimodal cuing for compensatory technique. Provided pt with RW & R hand orthosis & pt ambulates 50 ft with min assist. At end of session pt left in recliner with all needs in reach, chair alarm set.    Treatment 2: Pt received asleep in recliner but easily awakened & agreeable to tx. Pt transfers recliner>w/c via stand pivot without AD, min assist, and cuing for sequencing especially to push up on recliner armrest. Transported pt to gym via w/c dependent assist for time management. Pt transfers sit<>stand with cuing for managing RUE on hand orthosis and min assist. Pt ambulates 45 ft x 2 with RW & min assist with antalgic gait, decreased weight shifting R, decreased heel strike BLE, & decreased gait speed. Pt requires seated rest breaks between each trial 2/2 fatigue. Pt engages in standing & reaching across midline with LUE to obtain then toss horseshoes with focus on dynamic standing balance & weight shifting R with pt requiring min assist for standing balance. Pt utilized cybex kinetron in sitting with task focusing on BLE strengthening & R NMR, 1 minute x 3 trials. Pt progressed to using kinetron in standing with BUE support (therapist assisting RUE maintain grasp on handle) with cuing for upright posture & task focusing on dynamic balance, weight shifting & R NMR & strengthening. Pt  returned to room via w/c dependent assist (unable to attempt w/c propulsion 2/2 elevated seat height). Pt transfers w/c>recliner with min assist & pt left in recliner with BLE elevated, chair alarm donned, & all needs in reach. Pt with impaired memory as she asked about location of her cell phone 3x during session.   Pain: no c/o pain  reported during session.   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 Family/Caregiver Present: No   Home Living/Prior Functioning (obtained from chart) Home Living Available Help at Discharge: Family;Available PRN/intermittently(pt reports daughter works, son in law cares for children) Type of Home: House Home Access: Level entry Home Layout: Two level;Able to live on main level with bedroom/bathroom Additional Comments: home setup obtained from pt however pt with difficulty answering some questions given some language barrier  Lives With: Daughter Prior Function Level of Independence: Independent with basic ADLs;Independent with transfers;Independent with gait Driving: No Vocation: Retired Comments: daughter assisting some with iADL   Vision/Perception Pt reports she wears glasses for reading at baseline & denies changes in baseline vision.  Perception Perception: (appears intact) Praxis Praxis: Intact   Cognition Overall Cognitive Status: Difficult to assess(no family present to determine, pt appears to be [facility] & may have some language barrier, so difficult to assess) Arousal/Alertness: Awake/alert Orientation Level: Oriented X4 Sustained Attention: Appears intact Memory: Impaired Memory Impairment: Decreased recall  of new information Awareness: (to be assessed further) Problem Solving: (to be assessed further) Safety/Judgment: (to be assessed further)   Sensation Sensation Light Touch: Appears Intact(RLE) Proprioception: Appears Intact(RLE) Coordination Gross Motor Movements are Fluid and Coordinated: No Fine Motor Movements are Fluid and Coordinated: No Coordination and Movement Description: 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), no active movement noted in RUE   Motor  Motor Motor: Abnormal postural alignment and control Motor - Skilled Clinical Observations: 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), generalized deconditioning   Mobility Bed Mobility Bed Mobility: Rolling Right;Rolling Left;Supine to Sit;Sit to Supine(bed flat, no rails) Rolling Right: Supervision/verbal cueing Rolling Left: Minimal Assistance - Patient > 75%(cuing to attend to RUE) Supine to Sit: Minimal Assistance - Patient > 75%(for LE) Sit to Supine: Minimal Assistance - Patient > 75%(to elevate trunk) Transfers Transfers: Sit to Stand;Stand to Sit;Stand Pivot Transfers Sit to Stand: Minimal Assistance - Patient > 75% Stand to Sit: Minimal Assistance - Patient > 75% Stand Pivot Transfers: Minimal Assistance - Patient > 75% Stand Pivot Transfer Details: Verbal cues for sequencing;Verbal cues for precautions/safety;Verbal cues for technique Transfer (Assistive device): None   Locomotion  Gait Ambulation: Yes Gait Assistance: Moderate Assistance - Patient 50-74% Gait Distance (Feet): 8 Feet Assistive device: None Gait Gait: Yes Gait Pattern: Decreased hip/knee flexion - right;Decreased dorsiflexion - right;Decreased stride length;Decreased stance time - right;Decreased step length - right(decreased weight shifting R) Gait velocity: decreased Stairs / Additional Locomotion Stairs: Yes Stairs Assistance: Minimal Assistance - Patient > 75% Stair Management Technique: One rail Left Number of Stairs: 8 Height of Stairs: 3(inches) Wheelchair  Mobility Wheelchair Mobility: No   Trunk/Postural Assessment  Cervical Assessment Cervical Assessment: Exceptions to WFL(slightly rounded shoulders) Thoracic Assessment Thoracic Assessment: Within Functional Limits Lumbar Assessment Lumbar Assessment: Exceptions to WFL(slight posterior pelvic tilt) Postural Control Postural Control: Deficits on evaluation Righting Reactions: delayed Protective Responses: delayed   Balance Balance Balance Assessed: Yes Static Sitting Balance Static Sitting - Balance Support: Feet supported Static Sitting - Level of Assistance: 5: Stand by assistance Dynamic Standing Balance Dynamic Standing - Balance Support: No upper extremity supported;During functional activity Dynamic Standing - Level of Assistance: 3: Mod assist;4: Min assist Dynamic Standing - Balance Activities: Lateral lean/weight shifting Dynamic Standing -  Comments: mod assist gait without AD, min assist reaching for objects   Extremity Assessment  RUE Assessment General Strength Comments: no active movement noted LUE Assessment General Strength Comments: pt reports impaired shoulder mobility 2/2 hx of frozen shoulder  RLE Assessment General Strength Comments: tested in sitting, hip flexion = 3/5, knee flexion & extension & ankle dorsiflexion = 3+/5 (pt reports hx of R THA in ?March 2020) LLE Assessment LLE Assessment: Within Functional Limits    Refer to Care Plan for Long Term Goals  Recommendations for other services: Therapeutic Recreation  Kitchen group  Discharge Criteria: Patient will be discharged from PT if patient refuses treatment 3 consecutive times without medical reason, if treatment goals not met, if there is a change in medical status, if patient makes no progress towards goals or if patient is discharged from hospital.  The above assessment, treatment plan, treatment alternatives and goals were discussed and mutually agreed upon: by patient  Waunita Schooner 11/17/2019, 12:19 PM

## 2019-11-17 NOTE — Progress Notes (Signed)
Denise Leon PHYSICAL MEDICINE & REHABILITATION PROGRESS NOTE   Subjective/Complaints: Had a reasonable night. Having pain along bottom of right foot ?near arch. Hurts more with WB on foot.   ROS: Patient denies fever, rash, sore throat, blurred vision, nausea, vomiting, diarrhea, cough, shortness of breath or chest pain,   headache, or mood change.     Objective:   VAS [place] LOWER EXTREMITY VENOUS (DVT)  Result Date: 11/16/2019  Lower Venous Study Indications: Stroke.  Anticoagulation: Lovenox. Comparison Study: No prior exam. Performing Technologist: Baldwin Crown ARDMS, RVT  Examination Guidelines: A complete evaluation includes B-mode imaging, spectral Doppler, color Doppler, and power Doppler as needed of all accessible portions of each vessel. Bilateral testing is considered an integral part of a complete examination. Limited examinations for reoccurring indications may be performed as noted.  +---------+---------------+---------+-----------+----------+--------------+ RIGHT    CompressibilityPhasicitySpontaneityPropertiesThrombus Aging +---------+---------------+---------+-----------+----------+--------------+ CFV      Full           Yes      Yes                                 +---------+---------------+---------+-----------+----------+--------------+ SFJ      Full                                                        +---------+---------------+---------+-----------+----------+--------------+ FV Prox  Full                                                        +---------+---------------+---------+-----------+----------+--------------+ FV Mid   Full                                                        +---------+---------------+---------+-----------+----------+--------------+ FV DistalFull                                                        +---------+---------------+---------+-----------+----------+--------------+ PFV      Full                                                         +---------+---------------+---------+-----------+----------+--------------+ POP      Full           Yes      Yes                                 +---------+---------------+---------+-----------+----------+--------------+ PTV      Full                                                        +---------+---------------+---------+-----------+----------+--------------+  PERO     Full                                                        +---------+---------------+---------+-----------+----------+--------------+   +---------+---------------+---------+-----------+----------+--------------+ LEFT     CompressibilityPhasicitySpontaneityPropertiesThrombus Aging +---------+---------------+---------+-----------+----------+--------------+ CFV      Full           Yes      Yes                                 +---------+---------------+---------+-----------+----------+--------------+ SFJ      Full                                                        +---------+---------------+---------+-----------+----------+--------------+ FV Prox  Full                                                        +---------+---------------+---------+-----------+----------+--------------+ FV Mid   Full                                                        +---------+---------------+---------+-----------+----------+--------------+ FV DistalFull                                                        +---------+---------------+---------+-----------+----------+--------------+ PFV      Full                                                        +---------+---------------+---------+-----------+----------+--------------+ POP      Full           Yes      Yes                                 +---------+---------------+---------+-----------+----------+--------------+ PTV      Full                                                         +---------+---------------+---------+-----------+----------+--------------+ PERO     Full                                                        +---------+---------------+---------+-----------+----------+--------------+  Summary: Right: There is no evidence of deep vein thrombosis in the lower extremity. No cystic structure found in the popliteal fossa. Left: There is no evidence of deep vein thrombosis in the lower extremity. No cystic structure found in the popliteal fossa.  *See table(s) above for measurements and observations. Electronically signed by Fabienne Bruns MD on 11/16/2019 at 5:05:29 PM.    Final    Recent Labs    11/16/19 0203 11/17/19 0547  WBC 9.5 11.4*  HGB 9.8* 10.8*  HCT 29.3* 32.3*  PLT 267 312   Recent Labs    11/16/19 0203 11/17/19 0547  NA 141 139  K 3.9 4.0  CL 108 104  CO2 24 25  GLUCOSE 116* 121*  BUN 26* 25*  CREATININE 1.45* 1.48*  CALCIUM 8.8* 9.1    Intake/Output Summary (Last 24 hours) at 11/17/2019 0928 Last data filed at 11/17/2019 0900 Gross per 24 hour  Intake 180 ml  Output --  Net 180 ml     Physical Exam: Vital Signs Blood pressure (!) 148/74, pulse 81, temperature 98.5 F (36.9 C), resp. rate 18, weight 63.6 kg, SpO2 98 %. Constitutional: No distress . Vital signs reviewed. HEENT: EOMI, oral membranes moist Neck: supple Cardiovascular: RRR without murmur. No JVD    Respiratory: CTA Bilaterally without wheezes or rales. Normal effort    GI: BS +, non-tender, non-distended  Extremities: No clubbing, cyanosis, or edema Skin: No evidence of breakdown, no evidence of rash  Musc: mild pain with palpation along right medial arch Neurologic: Cranial nerves II through XII intact, motor 5/5 LUE and LLE. RUE tr-1 pec/deltoid, trace biceps and 1/5 wrist and finger flexors. RLE: 2/5 HF, KE and 4/5 ADF/PF. No obvious sensory abnl. Expressive language deficits (may be cultural barrier as well)   Assessment/Plan: 1. Functional deficits  secondary to left frontal infarct which require 3+ hours per day of interdisciplinary therapy in a comprehensive inpatient rehab setting.  Physiatrist is providing close team supervision and 24 hour management of active medical problems listed below.  Physiatrist and rehab team continue to assess barriers to discharge/monitor patient progress toward functional and medical goals  Care Tool:  Bathing              Bathing assist       Upper Body Dressing/Undressing Upper body dressing   What is the patient wearing?: Hospital gown only    Upper body assist Assist Level: Total Assistance - Patient < 25%    Lower Body Dressing/Undressing Lower body dressing      What is the patient wearing?: Underwear/pull up     Lower body assist Assist for lower body dressing: Moderate Assistance - Patient 50 - 74%     Toileting Toileting    Toileting assist Assist for toileting: Maximal Assistance - Patient 25 - 49%     Transfers Chair/bed transfer  Transfers assist     Chair/bed transfer assist level: Maximal Assistance - Patient 25 - 49%     Locomotion Ambulation   Ambulation assist              Walk 10 feet activity   Assist           Walk 50 feet activity   Assist           Walk 150 feet activity   Assist           Walk 10 feet on uneven surface  activity   Assist  Wheelchair     Assist               Wheelchair 50 feet with 2 turns activity    Assist            Wheelchair 150 feet activity     Assist          Blood pressure (!) 148/74, pulse 81, temperature 98.5 F (36.9 C), resp. rate 18, weight 63.6 kg, SpO2 98 %.  Medical Problem List and Plan: 1.Right side weakness with facial droopsecondary to patchy small infarct posterior left frontal region. Status post TPA. Plan 30 day event monitor. -patient may shower -ELOS/Goals: 10 to 14 days 2.  Antithrombotics: -DVT/anticoagulation:Lovenox. Lower extremity Dopplers negative -antiplatelet therapy: Aspirin 325 mg daily and Plavix 75 mg daily 3. Pain Management:Neurontin 400 mg twice daily, Lidoderm patch  -Ice prn to right medial arch  -sports cream prn 4. Mood:Melatonin 3 mg nightly -antipsychotic agents: N/A 5. Neuropsych: This patientiscapable of making decisions on herown behalf. 6. Skin/Wound Care:Routine skin checks 7. Fluids/Electrolytes/Nutrition:encourage PO  -I personally reviewed the patient's labs today.   8. Hypertension. Toprol-XL 25 mg daily. Monitor with increased mobility 9. CKD stage III. Follow-up chemistries-->Cr at 1.48 likely near baseline 10. Hyperlipidemia. Lipitor    LOS: 1 days A FACE TO FACE EVALUATION WAS PERFORMED  Ranelle Oyster 11/17/2019, 9:28 AM

## 2019-11-17 NOTE — Evaluation (Signed)
Occupational Therapy Assessment and Plan  Patient Details  Name: Denise Leon MRN: [id number] Date of Birth: July 05, 1946  OT Diagnosis: abnormal posture, cognitive deficits, hemiplegia affecting dominant side, muscle weakness (generalized) and coordination disorder Rehab Potential: Rehab Potential (ACUTE ONLY): Good ELOS: 10-14 days   Today's Date: 11/17/2019 OT Individual Time: 1345-1440 OT Individual Time Calculation (min): 55 min     Problem List:  Patient Active Problem List   Diagnosis Date Noted  . Palpitations 11/16/2019  . Essential hypertension 11/16/2019  . Hyperlipidemia 11/16/2019  . CKD (chronic kidney disease), stage IIIb 11/16/2019  . Thyroid nodule, L 11/16/2019  . 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 Stroke ([place]) 11/14/2019  . Acute ischemic stroke ([facility]) patchy L frontal infarcts s/p tPA 11/14/2019    Past Medical History:  Past Medical History:  Diagnosis Date  . Asthma   . Hypertension    Past Surgical History:  Past Surgical History:  Procedure Laterality Date  . HIP SURGERY    . NECK SURGERY      Assessment & Plan Clinical Impression: Patient is a 74 y.o. year old female with history of asthma, CKD stage III, hip and neck surgery as well as hypertension. Per chart review lives with daughter. Up until October patient had been living alone in [place]. Independent mobility prior to admission. Daughter did assist with some basic ADLs. Presented 11/14/2019 with headache, right side weakness and facial droop. Cranial CT scan showed question subtle loss of gray-white matter differentiation involving the posterior left frontal region suspicious for early developing acute left MCA territory ischemia. Patient did receive TPA. Admission chemistries BUN 28, creatinine 1.49, SARS coronavirus negative, urinalysis negative nitrite. CT angiogram of head and neck negative for large vessel occlusion. There was an incidental finding of a 19 mm  left thyroid nodule advised follow-up outpatient. 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 Echocardiogram with ejection fraction of 60% without emboli. Neurology follow-up maintained on aspirin 325 mg daily and Plavix 75 mg daily. Lovenox for DVT prophylaxis. Plan 30 day cardiac event monitor.   Patient transferred to CIR on 11/16/2019 .    Patient currently requires min - mod A with basic self-care skills and IADL secondary to muscle weakness, decreased cardiorespiratoy endurance, decreased coordination and decreased motor planning, endurance, decreased problem solving and decreased safety awareness and decreased sitting balance, decreased standing balance, decreased postural control, hemiplegia and decreased balance strategies.  Prior to hospitalization, patient could complete ADLs and IADLs with independent .  Patient will benefit from skilled intervention to decrease level of assist with basic self-care skills prior to discharge home with care partner.  Anticipate patient will require 24 hour supervision and follow up home health.  OT - End of Session Activity Tolerance: Decreased this session Endurance Deficit: Yes Endurance Deficit Description: generalized deconditioning OT Assessment Rehab Potential (ACUTE ONLY): Good OT Barriers to Discharge: Other (comments) OT Barriers to Discharge Comments: language barrier?? OT Patient demonstrates impairments in the following area(s): Balance;Cognition;Endurance;Motor;Pain;Safety;Sensory OT Basic ADL's Functional Problem(s): Eating;Bathing;Grooming;Dressing;Toileting OT Transfers Functional Problem(s): Toilet;Tub/Shower OT Additional Impairment(s): Fuctional Use of Upper Extremity OT Plan OT Intensity: Minimum of 1-2 x/day, 45 to 90 minutes OT Frequency: 5 out of 7 days OT Duration/Estimated Length of Stay: 2 weeks OT  Treatment/Interventions: Balance/vestibular training;DME/adaptive equipment instruction;Patient/family education;Therapeutic Activities;Wheelchair propulsion/positioning;Cognitive remediation/compensation;Functional electrical stimulation;Psychosocial support;Therapeutic Exercise;Community reintegration;Functional mobility training;Self Care/advanced ADL retraining;UE/LE Strength taining/ROM;Discharge planning;Neuromuscular re-education;UE/LE Coordination  activities;Pain management OT Self Feeding Anticipated Outcome(s): set up A OT Basic Self-Care Anticipated Outcome(s): supervision OT Toileting Anticipated Outcome(s): supervision OT Bathroom Transfers Anticipated Outcome(s): supervision OT Recommendation Recommendations for Other Services: Neuropsych consult Patient destination: Home Follow Up Recommendations: Home health OT;24 hour supervision/assistance Equipment Recommended: To be determined   Skilled Therapeutic Intervention Upon entering the room, pt seated in recliner chair and sleeping soundly. OT educated pt on OT purpose and role with pt verbalizing understanding. When asking pt to shower or wash at sink she refused this session and asked therapist to return letter. OT educating pt on therapy schedule and expectations. Pt does appear to be [facility] but also needs several questions rephrased and unsure if there is potential language barrier. Pt ambulating with min A and use of RW into bathroom for toileting. Pt needing min - mod A for standing balance with clothing management and hygiene. Pt able to void and attempting to have BM but was unable. Pt cleansing peri area and changing mesh underwear with mod A to thread onto B feet and mod A for balance to pull over B hips. Pt sitting in wheelchair at sink for grooming tasks with set up A overall but needing additional assist to clean dentures properly this session. OT educated pt on OT goals and POC with pt verbalizing understanding. Pt transferred to  bed with mod A not using AD. Sit >supine with min A. OT assisted with positioning to protect hemiplegic side. Bed alarm activated and call bell within reach upon exiting the room.   OT Evaluation Precautions/Restrictions  Precautions Precautions: Fall Precaution Comments: 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) Restrictions Weight Bearing Restrictions: No Pain Pain Assessment Pain Scale: 0-10 Pain Score: 0-No pain Home Living/Prior Functioning Home Living Available Help at Discharge: Family, Available PRN/intermittently Type of Home: House Home Access: Level entry Home Layout: Two level, Able to live on main level with bedroom/bathroom Bathroom Shower/Tub: Walk-in shower Additional Comments: home setup obtained from pt however pt with difficulty answering some questions given some language barrier. She verbalized she used a chair to shower  Lives With: Daughter Prior Function Level of Independence: Independent with basic ADLs, Independent with transfers, Independent with gait Driving: No Vocation: Retired Comments: daughter assisting some with iADL. Son in law is home during the day and taking care of 2 y/o child. Her daughter works during the day Vision Baseline Vision/History: Wears glasses Wears Glasses: Reading only Patient Visual Report: No change from baseline Perception  Perception: (appears intact) Praxis Praxis: Intact Cognition Overall Cognitive Status: Difficult to assess(HOH and possible lang. barrier) Arousal/Alertness: Awake/alert Orientation Level: Person;Place;Situation Person: Oriented Place: Oriented Situation: Oriented Year: 2021 Month: February Day of Week: Correct Memory: Impaired Memory Impairment: Decreased recall of new information Immediate Memory Recall: Sock;Blue;Bed Memory Recall Sock: Without Cue Memory Recall Blue: With Cue Memory Recall Bed: With Cue Sustained Attention: Appears intact Awareness: (to be assessed further) Problem Solving: (to be assessed  further) Safety/Judgment: (to be assessed further) Sensation Sensation Light Touch: Impaired by gross assessment Hot/Cold: Appears Intact Proprioception: Impaired by gross assessment Stereognosis: Impaired by gross assessment Additional Comments: R UE Coordination Gross Motor Movements are Fluid and Coordinated: No Fine Motor Movements are Fluid and Coordinated: No Coordination and Movement Description: 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), no active movement noted in RUE Motor  Motor Motor: Abnormal postural alignment and control Motor - Skilled Clinical Observations: 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), generalized deconditioning Mobility  Bed Mobility Bed Mobility: Rolling Right;Rolling Left;Supine to Sit;Sit to Supine Rolling Right: Supervision/verbal cueing Rolling  Left: Minimal Assistance - Patient > 75% Supine to Sit: Minimal Assistance - Patient > 75% Sit to Supine: Minimal Assistance - Patient > 75% Transfers Sit to Stand: Minimal Assistance - Patient > 75% Stand to Sit: Minimal Assistance - Patient > 75%  Trunk/Postural Assessment  Cervical Assessment Cervical Assessment: Exceptions to WFL(slightly rounded shoulders) Thoracic Assessment Thoracic Assessment: Within Functional Limits Lumbar Assessment Lumbar Assessment: Exceptions to WFL(posterior pelvic tilt) Postural Control Postural Control: Deficits on evaluation Righting Reactions: delayed Protective Responses: delayed  Balance Balance Balance Assessed: Yes Static Sitting Balance Static Sitting - Balance Support: Feet supported Static Sitting - Level of Assistance: 5: Stand by assistance Dynamic Sitting Balance Dynamic Sitting - Balance Support: Feet supported;During functional activity Dynamic Sitting - Level of Assistance: 5: Stand by assistance Static Standing Balance Static Standing - Balance Support: During functional activity Static Standing - Level of Assistance: 4: Min assist Dynamic Standing Balance Dynamic Standing - Balance  Support: No upper extremity supported;During functional activity Dynamic Standing - Level of Assistance: 3: Mod assist;4: Min assist Dynamic Standing - Balance Activities: Lateral lean/weight shifting Dynamic Standing - Comments: mod assist gait without AD, min assist reaching for objects Extremity/Trunk Assessment RUE Assessment RUE Assessment: Exceptions to [facility] Passive Range of Motion (PROM) Comments: WNL General Strength Comments: no active movement noted- flaccid LUE Assessment LUE Assessment: Exceptions to [facility] Active Range of Motion (AROM) Comments: WNL digits to elbow - decreased shoulder elevation for several years per pt General Strength Comments: pt reports impaired shoulder mobility 2/2 hx of frozen shoulder - shoulder elevation 60 degrees     Refer to Care Plan for Long Term Goals  Recommendations for other services: None    Discharge Criteria: Patient will be discharged from OT if patient refuses treatment 3 consecutive times without medical reason, if treatment goals not met, if there is a change in medical status, if patient makes no progress towards goals or if patient is discharged from hospital.  The above assessment, treatment plan, treatment alternatives and goals were discussed and mutually agreed upon: by patient  Gypsy Decant 11/17/2019, 2:41 PM

## 2019-11-18 ENCOUNTER — Inpatient Hospital Stay (HOSPITAL_COMMUNITY): Payer: Medicaid Other | Admitting: Physical Therapy

## 2019-11-18 ENCOUNTER — Inpatient Hospital Stay (HOSPITAL_COMMUNITY): Payer: Medicaid Other | Admitting: Speech Pathology

## 2019-11-18 ENCOUNTER — Inpatient Hospital Stay (HOSPITAL_COMMUNITY): Payer: Medicaid Other

## 2019-11-18 DIAGNOSIS — I639 Cerebral infarction, unspecified: Secondary | ICD-10-CM

## 2019-11-18 DIAGNOSIS — I4891 Unspecified atrial fibrillation: Secondary | ICD-10-CM

## 2019-11-18 MED ORDER — DOCUSATE SODIUM 100 MG PO CAPS: 100.0000 mg | ORAL_CAPSULE | Freq: Two times a day (BID) | ORAL | Status: DC | PRN

## 2019-11-18 NOTE — Progress Notes (Signed)
Speech Language Pathology Daily Session Note  Patient Details  Name: Denise Leon MRN: [id number] Date of Birth: 1946-04-06  Today's Date: 11/18/2019 SLP Individual Time: 1200-1220 SLP Individual Time Calculation (min): 20 min  Short Term Goals: Week 1: SLP Short Term Goal 1 (Week 1): Patient will consume regular textures with thin liquids with minimal overt s/s of aspiration with Mod I. SLP Short Term Goal 2 (Week 1): Patient will recall new, daily information with Mod I for use of compensatory strategies. SLP Short Term Goal 3 (Week 1): Patient will demonstrate complex problem solving with functional tasks with Mod I.  Skilled Therapeutic Interventions: Skilled treatment session focused on dysphagia goals. SLP facilitated session by providing skilled observation with lunch meal of regular textures with thin liquids. Patient consumed meal without overt s/s of aspiration with frequent belching noted. Recommend patient continue current diet. Patient was also overall Mod I for tray set-up. Patient left upright in recliner with all needs within reach. Continue with current plan of care.      Pain No/Denies Pain   Therapy/Group: Individual Therapy  Kandie Keiper 11/18/2019, 2:53 PM

## 2019-11-18 NOTE — Progress Notes (Signed)
Occupational Therapy Session Note  Patient Details  Name: Denise Leon MRN: [id number] Date of Birth: 1946-08-17  Today's Date: 11/18/2019 OT Individual Time: 6387-5643 OT Individual Time Calculation (min): 70 min    Short Term Goals: Week 1:  OT Short Term Goal 1 (Week 1): STGs= LTGs secondary to estimated short LOS  Skilled Therapeutic Interventions/Progress Updates:    Pt received on BSC with NT, voiding urine. Pt agreeable to shower. Pt completed stand pivot transfer to the w/c from [facility] with min A. Pt transferred into shower with min A, onto TTB. Pt completed UB bathing with min A for reaching under LUE. Pt able to stand with grab bar support with min A to complete peri hygiene. Pt transferred back to w/c following shower and was assisted in donning new gown (no clothes present) with mod A. Pt completed hair care at the sink with mod A. Oral care completed with set up assist. Pt donned mesh underwear with assistance to bring RLE into figure 4 and min A to pull up in standing. Pt donned socks with demo provided re single hand hemi technique, mod A required for RLE and pt able to teach back on the LLE. Pt with questions re RUE NMR and recovery, explained CVA recovery principles as well as possible with slight language barrier. Pt completed self PROM to the RUE with demonstration provided. Pt completed 2 trials of 25 ft of functional mobility using the RW. R lean present, cueing provided to correct. Mod cueing overall for RW management and UE placement/use of RUE orthosis. Pt was left sitting up in the recliner with all needs met. Chair alarm set.   Therapy Documentation Precautions:  Precautions Precautions: Fall Precaution Comments: 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) Restrictions Weight Bearing Restrictions: No   Therapy/Group: Individual Therapy  Curtis Sites 11/18/2019, 12:26 PM

## 2019-11-18 NOTE — Evaluation (Signed)
Speech Language Pathology Assessment and Plan  Patient Details  Name: Denise Leon MRN: [id number] Date of Birth: 07/13/1946  SLP Diagnosis: Cognitive Impairments;Dysphagia  Rehab Potential: Excellent ELOS: 12-14 days    Today's Date: 11/18/2019 SLP Individual Time: 0825-0900 SLP Individual Time Calculation (min): 35 min   Problem List:  Patient Active Problem List   Diagnosis Date Noted  . Palpitations 11/16/2019  . Essential hypertension 11/16/2019  . Hyperlipidemia 11/16/2019  . CKD (chronic kidney disease), stage IIIb 11/16/2019  . Thyroid nodule, L 11/16/2019  . Left middle cerebral artery stroke ([place]) 11/16/2019  . Stroke ([place]) 11/14/2019  . Acute ischemic stroke ([facility]) patchy L frontal infarcts s/p tPA 11/14/2019   Past Medical History:  Past Medical History:  Diagnosis Date  . Asthma   . Hypertension    Past Surgical History:  Past Surgical History:  Procedure Laterality Date  . HIP SURGERY    . NECK SURGERY      Assessment / Plan / Recommendation Clinical Impression Patient is a 74 year old right-handed female with history of asthma, CKD stage III, hip and neck surgery as well as hypertension. Per chart review lives with daughter. Up until October patient had been living alone in [place]. Independent mobility prior to admission. Daughter did assist with some basic ADLs. Presented 11/14/2019 with headache, right side weakness and facial droop. Cranial CT scan showed question subtle loss of gray-white matter differentiation involving the posterior left frontal region suspicious for early developing acute left MCA territory ischemia. Patient did receive TPA. Admission chemistries BUN 28, creatinine 1.49, SARS coronavirus negative, urinalysis negative nitrite. CT angiogram of head and neck negative for large vessel occlusion. There was an incidental finding of a 19 mm left thyroid nodule advised follow-up outpatient. MRI of the brain patchy small volume acute  ischemic infarction involving the posterior left frontal region with involvement of the pre and postcentral gyri. Echocardiogram with ejection fraction of 60% without emboli. Neurology follow-up maintained on aspirin 325 mg daily and Plavix 75 mg daily. Lovenox for DVT prophylaxis. Plan 30 day cardiac event monitor. Therapy evaluations completed and patient was admitted for a comprehensive rehab program 11/16/19.  Patient demonstrates mild cognitive deficits impacting complex problem solving and recall of new and functional information. Patient's auditory comprehension and verbal expression appeared [facility] for all tasks assessed with patient requiring intermittent repetition secondary to being hard of hearing and clarification due to mild language barrier. Patient's oral-motor strength and coordination appeared [facility] and patient demonstrated efficient mastication and complete oral clearance with solid textures. No overt s/s of aspiration were noted with solids or with thin liquids via straw, however, patient with frequent belching and possible reflux. Recommend patient upgrade to regular textures and maintain an upright posture for 30 minutes after meals. Patient would benefit from skilled SLP intervention to maximize her swallowing and cognitive functioning prior to discharge.    Skilled Therapeutic Interventions          Administered a cognitive-linguistic evaluation and BSE, please see above for details.   SLP Assessment  Patient will need skilled Speech Lanaguage Pathology Services during CIR admission    Recommendations  SLP Diet Recommendations: Age appropriate regular solids;Thin Liquid Administration via: Straw Medication Administration: Whole meds with puree Supervision: Patient able to self feed Compensations: Slow rate;Small sips/bites;Minimize environmental distractions;Follow solids with liquid Postural Changes and/or Swallow Maneuvers: Seated upright 90 degrees;Upright 30-60 min after  meal Oral Care Recommendations: Oral care BID Patient destination: Home Follow up Recommendations: None Equipment Recommended: None recommended  by SLP    SLP Frequency 3 to 5 out of 7 days   SLP Duration  SLP Intensity  SLP Treatment/Interventions 12-14 days  Minumum of 1-2 x/day, 30 to 90 minutes  Cognitive remediation/compensation;Dysphagia/aspiration precaution training;Internal/external aids;Therapeutic Activities;Environmental controls;Cueing hierarchy;Patient/family education;Functional tasks    Pain No/Denies Pain  SLP Evaluation Cognition Overall Cognitive Status: Impaired/Different from baseline Arousal/Alertness: Awake/alert Orientation Level: Oriented X4 Sustained Attention: Appears intact Memory: Impaired Memory Impairment: Decreased recall of new information Awareness: Appears intact Problem Solving: Impaired Problem Solving Impairment: Functional complex Safety/Judgment: Appears intact  Comprehension Auditory Comprehension Overall Auditory Comprehension: Appears within functional limits for tasks assessed Expression Expression Primary Mode of Expression: Verbal Verbal Expression Overall Verbal Expression: Appears within functional limits for tasks assessed Oral Motor Oral Motor/Sensory Function Overall Oral Motor/Sensory Function: Within functional limits Motor Speech Overall Motor Speech: Appears within functional limits for tasks assessed   Bedside Swallowing Assessment General Date of Onset: 11/14/19 Previous Swallow Assessment: BSE on 1/30, recommended Dys. 3 textures with thin liquids Diet Prior to this Study: Dysphagia 3 (soft);Thin liquids Temperature Spikes Noted: No Respiratory Status: Room air History of Recent Intubation: No Behavior/Cognition: Alert;Cooperative Oral Cavity - Dentition: Dentures, top;Dentures, bottom Self-Feeding Abilities: Able to feed self;Needs set up Vision: Functional for self-feeding Patient Positioning:  Upright in chair/Tumbleform Baseline Vocal Quality: Normal Volitional Cough: Strong Volitional Swallow: Able to elicit  Oral Care Assessment   Ice Chips Ice chips: Not tested Thin Liquid Presentation: Cup;Straw Nectar Thick Nectar Thick Liquid: Not tested Honey Thick Honey Thick Liquid: Not tested Puree Puree: Within functional limits Presentation: Spoon;Self Fed Solid Solid: Within functional limits Presentation: Spoon;Self Fed BSE Assessment Suspected Esophageal Findings Suspected Esophageal Findings: Belching Risk for Aspiration Impact on safety and function: Mild aspiration risk  Short Term Goals: Week 1: SLP Short Term Goal 1 (Week 1): Patient will consume regular textures with thin liquids with minimal overt s/s of aspiration with Mod I. SLP Short Term Goal 2 (Week 1): Patient will recall new, daily information with Mod I for use of compensatory strategies. SLP Short Term Goal 3 (Week 1): Patient will demonstrate complex problem solving with functional tasks with Mod I.  Refer to Care Plan for Long Term Goals  Recommendations for other services: None   Discharge Criteria: Patient will be discharged from SLP if patient refuses treatment 3 consecutive times without medical reason, if treatment goals not met, if there is a change in medical status, if patient makes no progress towards goals or if patient is discharged from hospital.  The above assessment, treatment plan, treatment alternatives and goals were discussed and mutually agreed upon: by patient  Giankarlo Leamer 11/18/2019, 2:48 PM

## 2019-11-18 NOTE — Progress Notes (Signed)
Physical Therapy Session Note  Patient Details  Name: Denise Leon MRN: [id number] Date of Birth: September 21, 1946  Today's Date: 11/18/2019 PT Individual Time: 1331-1426 PT Individual Time Calculation (min): 55 min   Short Term Goals: Week 1:  PT Short Term Goal 1 (Week 1): Pt will ambulate 50 ft with LRAD & supervision. PT Short Term Goal 2 (Week 1): Pt will negotiate 4 steps with rails & CGA. PT Short Term Goal 3 (Week 1): Pt will complete bed<>w/c with CGA with LRAD.  Skilled Therapeutic Interventions/Progress Updates:  Pt received in bed & agreeable to tx, but reporting fatigue. Pt transfers supine>R sidelying with supervision, sidelying>sitting EOB with min assist and max cuing for technique to push with LUE. Pt transfers bed>w/c with min assist, stand pivot without AD. Transported pt to/from gym via w/c dependent assist for time management. Pt transfers sit<>stand with min assist with ongoing max cuing for pt to manage RUE On hand orthosis. Pt ambulates 35 ft + 40 ft with RW & min assist with decreased hip/knee flexion & dorsiflexion RLE, decreased step length RLE, and decreased weight shifting R. Pt transfers to tall kneeling on EOM with bench for UE support and engaged in transferring cups L<>R to work on crossing midline with cuing for upright posture and anterior pelvic tilt with task focusing on glute/hamstring strengthening, trunk control, and balance. While standing EOM pt engaged in kick ball with min/mod assist for standing balance with task focusing on weight shifting L<>R, RLE strengthening & NMR, & dynamic balance. Pt returned to room & completes stand pivot w/c>bed without AD & min assist. Pt transfers sit>supine with supervision and HOB slightly elevated. Pt left in bed with alarm set, call bell in reach, & EKG team entering room.  During session pt reports she was taking gabapentin at home 2/2 "spinal column" issue and nerve pain but pt unable to state where in her body she was  experiencing nerve pain & states her "spinal" issue caused her to use rollator at home, as she declined surgery 2/2 her age.  Therapy Documentation Precautions:  Precautions Precautions: Fall Precaution Comments: 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) Restrictions Weight Bearing Restrictions: No  Pain: Pt reports unrated pain in L frozen shoulder - pt reports nothing helps alleviate pain at home. During standing/gait pt reports burning pain in arch of R foot but states it feels a little better the more she walks, but rest breaks provided PRN. Pt declines asking for pain medication at this time.  Therapy/Group: Individual Therapy  Sandi Mariscal 11/18/2019, 3:04 PM

## 2019-11-18 NOTE — Progress Notes (Signed)
Zio patch placed onto patient.  All instructions and information reviewed with patient, they verbalize understanding with no questions. 

## 2019-11-18 NOTE — Plan of Care (Signed)
  Problem: Consults Goal: RH STROKE PATIENT EDUCATION Description: See Patient Education module for education specifics  Outcome: Progressing   Problem: RH BOWEL ELIMINATION Goal: RH STG MANAGE BOWEL WITH ASSISTANCE Description: STG Manage Bowel with Assistance. Outcome: Progressing Flowsheets (Taken 11/18/2019 1532) STG: Pt will manage bowels with assistance: 4-Minimum assistance Goal: RH STG MANAGE BOWEL W/MEDICATION W/ASSISTANCE Description: STG Manage Bowel with Medication with Assistance. Outcome: Progressing   Problem: RH BLADDER ELIMINATION Goal: RH STG MANAGE BLADDER WITH ASSISTANCE Description: STG Manage Bladder With Assistance Outcome: Progressing Flowsheets (Taken 11/18/2019 1532) STG: Pt will manage bladder with assistance: 4-Minimal assistance   Problem: RH SKIN INTEGRITY Goal: RH STG SKIN FREE OF INFECTION/BREAKDOWN Outcome: Progressing Goal: RH STG MAINTAIN SKIN INTEGRITY WITH ASSISTANCE Description: STG Maintain Skin Integrity With Assistance. Outcome: Progressing Flowsheets (Taken 11/18/2019 1532) STG: Maintain skin integrity with assistance: 4-Minimal assistance   Problem: RH SAFETY Goal: RH STG ADHERE TO SAFETY PRECAUTIONS W/ASSISTANCE/DEVICE Description: STG Adhere to Safety Precautions With Assistance/Device. Outcome: Progressing Flowsheets (Taken 11/18/2019 1532) STG:Pt will adhere to safety precautions with assistance/device: 3-Moderate assistance   Problem: RH PAIN MANAGEMENT Goal: RH STG PAIN MANAGED AT OR BELOW PT'S PAIN GOAL Outcome: Progressing   Problem: RH KNOWLEDGE DEFICIT Goal: RH STG INCREASE KNOWLEDGE OF DYSPHAGIA/FLUID INTAKE Outcome: Progressing Goal: RH STG INCREASE KNOWLEDGE OF STROKE PROPHYLAXIS Outcome: Progressing

## 2019-11-18 NOTE — Progress Notes (Signed)
[place] PHYSICAL MEDICINE & REHABILITATION PROGRESS NOTE   Subjective/Complaints: No complaints this morning. Denies pain, constipation. Sleeping well at night.   ROS: Patient denies fever, rash, sore throat, blurred vision, nausea, vomiting, diarrhea, cough, shortness of breath or chest pain,   headache, or mood change.   Objective:   VAS [place] LOWER EXTREMITY VENOUS (DVT)  Result Date: 11/16/2019  Lower Venous Study Indications: Stroke.  Anticoagulation: Lovenox. Comparison Study: No prior exam. Performing Technologist: Kennedy Bucker ARDMS, RVT  Examination Guidelines: A complete evaluation includes B-mode imaging, spectral Doppler, color Doppler, and power Doppler as needed of all accessible portions of each vessel. Bilateral testing is considered an integral part of a complete examination. Limited examinations for reoccurring indications may be performed as noted.  +---------+---------------+---------+-----------+----------+--------------+ RIGHT    CompressibilityPhasicitySpontaneityPropertiesThrombus Aging +---------+---------------+---------+-----------+----------+--------------+ CFV      Full           Yes      Yes                                 +---------+---------------+---------+-----------+----------+--------------+ SFJ      Full                                                        +---------+---------------+---------+-----------+----------+--------------+ FV Prox  Full                                                        +---------+---------------+---------+-----------+----------+--------------+ FV Mid   Full                                                        +---------+---------------+---------+-----------+----------+--------------+ FV DistalFull                                                        +---------+---------------+---------+-----------+----------+--------------+ PFV      Full                                                         +---------+---------------+---------+-----------+----------+--------------+ POP      Full           Yes      Yes                                 +---------+---------------+---------+-----------+----------+--------------+ PTV      Full                                                        +---------+---------------+---------+-----------+----------+--------------+  PERO     Full                                                        +---------+---------------+---------+-----------+----------+--------------+   +---------+---------------+---------+-----------+----------+--------------+ LEFT     CompressibilityPhasicitySpontaneityPropertiesThrombus Aging +---------+---------------+---------+-----------+----------+--------------+ CFV      Full           Yes      Yes                                 +---------+---------------+---------+-----------+----------+--------------+ SFJ      Full                                                        +---------+---------------+---------+-----------+----------+--------------+ FV Prox  Full                                                        +---------+---------------+---------+-----------+----------+--------------+ FV Mid   Full                                                        +---------+---------------+---------+-----------+----------+--------------+ FV DistalFull                                                        +---------+---------------+---------+-----------+----------+--------------+ PFV      Full                                                        +---------+---------------+---------+-----------+----------+--------------+ POP      Full           Yes      Yes                                 +---------+---------------+---------+-----------+----------+--------------+ PTV      Full                                                         +---------+---------------+---------+-----------+----------+--------------+ PERO     Full                                                        +---------+---------------+---------+-----------+----------+--------------+  Summary: Right: There is no evidence of deep vein thrombosis in the lower extremity. No cystic structure found in the popliteal fossa. Left: There is no evidence of deep vein thrombosis in the lower extremity. No cystic structure found in the popliteal fossa.  *See table(s) above for measurements and observations. Electronically signed by Ruta Hinds MD on 11/16/2019 at 5:05:29 PM.    Final    Recent Labs    11/16/19 0203 11/17/19 0547  WBC 9.5 11.4*  HGB 9.8* 10.8*  HCT 29.3* 32.3*  PLT 267 312   Recent Labs    11/16/19 0203 11/17/19 0547  NA 141 139  K 3.9 4.0  CL 108 104  CO2 24 25  GLUCOSE 116* 121*  BUN 26* 25*  CREATININE 1.45* 1.48*  CALCIUM 8.8* 9.1    Intake/Output Summary (Last 24 hours) at 11/18/2019 0900 Last data filed at 11/18/2019 0730 Gross per 24 hour  Intake 600 ml  Output --  Net 600 ml     Physical Exam: Vital Signs Blood pressure (!) 169/81, pulse 81, temperature 98.6 F (37 C), resp. rate 18, weight 63.3 kg, SpO2 97 %. Constitutional: No distress . Vital signs reviewed. Sitting up in chair.  HEENT: EOMI, oral membranes moist Neck: supple Cardiovascular: RRR without murmur. No JVD    Respiratory: CTA Bilaterally without wheezes or rales. Normal effort    GI: BS +, non-tender, non-distended  Extremities: No clubbing, cyanosis, or edema Skin: No evidence of breakdown, no evidence of rash  Musc: mild pain with palpation along right medial arch Neurologic: AOx3. Cranial nerves II through XII intact, motor 5/5 LUE and LLE. RUE tr-1 pec/deltoid, trace biceps and 1/5 wrist and finger flexors. RLE: 2/5 HF, KE and 4/5 ADF/PF. No obvious sensory abnl. Expressive language deficits (may be cultural barrier as well). Impaired recall.   Psych: Pleasant, cooperative.   Assessment/Plan: 1. 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  Physiatrist is providing close team supervision and 24 hour management of active medical problems listed below.  Physiatrist and rehab team continue to assess barriers to discharge/monitor patient progress toward functional and medical goals  Care Tool:  Bathing  Bathing activity did not occur: Refused           Bathing assist       Upper Body Dressing/Undressing Upper body dressing   What is the patient wearing?: Hospital gown only    Upper body assist Assist Level: Moderate Assistance - Patient 50 - 74%    Lower Body Dressing/Undressing Lower body dressing      What is the patient wearing?: Underwear/pull up     Lower body assist Assist for lower body dressing: Maximal Assistance - Patient 25 - 49%     Toileting Toileting    Toileting assist Assist for toileting: Moderate Assistance - Patient 50 - 74%     Transfers Chair/bed transfer  Transfers assist     Chair/bed transfer assist level: Moderate Assistance - Patient 50 - 74%     Locomotion Ambulation   Ambulation assist      Assist level: Minimal Assistance - Patient > 75% Assistive device: Walker-rolling Max distance: 50 ft   Walk 10 feet activity   Assist  Walk 10 feet activity did not occur: Safety/medical concerns  Assist level: Minimal Assistance - Patient > 75% Assistive device: Walker-rolling   Walk 50 feet activity   Assist Walk 50 feet  with 2 turns activity did not occur: Safety/medical concerns  Assist level: Minimal Assistance - Patient > 75% Assistive device: Walker-rolling    Walk 150 feet activity   Assist Walk 150 feet activity did not occur: Safety/medical concerns         Walk 10 feet on uneven surface  activity   Assist Walk 10 feet on uneven  surfaces activity did not occur: Safety/medical concerns         Wheelchair     Assist Will patient use wheelchair at discharge?: No             Wheelchair 50 feet with 2 turns activity    Assist            Wheelchair 150 feet activity     Assist          Blood pressure (!) 169/81, pulse 81, temperature 98.6 F (37 C), resp. rate 18, weight 63.3 kg, SpO2 97 %.  Medical Problem List and Plan: 1.Right side weakness with facial droopsecondary to patchy small infarct posterior left frontal region. Status post TPA. Plan 30 day event monitor. -patient may shower -ELOS/Goals: 10 to 14 days  -2/3: Reviewed therapy notes. Currently requiring min-Mod A with self-care and IADLs.  2. Antithrombotics: -DVT/anticoagulation:Lovenox. Lower extremity Dopplers negative -antiplatelet therapy: Aspirin 325 mg daily and Plavix 75 mg daily 3. Pain Management:Neurontin 400 mg twice daily, Lidoderm patch  -Ice prn to right medial arch  -sports cream prn  2/3: well controlled.  4. Mood:Melatonin 3 mg nightly -antipsychotic agents: N/A 5. Neuropsych: This patientiscapable of making decisions on herown behalf. 6. Skin/Wound Care:Routine skin checks 7. Fluids/Electrolytes/Nutrition:encourage PO  -I personally reviewed the patient's labs today.   8. Hypertension. Toprol-XL 25 mg daily. Monitor with increased mobility  2/3: Flowsheet reviewed: labile, continue to monitor.  9. CKD stage III. Follow-up chemistries-->Cr at 1.48 likely near baseline 10. Hyperlipidemia. Lipitor 11. Constipation: 2/3: Patient denies constipation but as per OT note attempted to have BM this AM and was unable to. Will add prn colace.      LOS: 2 days A FACE TO FACE EVALUATION WAS PERFORMED  Jona Erkkila P Anjolie Majer 11/18/2019, 9:00 AM

## 2019-11-19 ENCOUNTER — Inpatient Hospital Stay (HOSPITAL_COMMUNITY): Payer: Medicaid Other | Admitting: Speech Pathology

## 2019-11-19 ENCOUNTER — Inpatient Hospital Stay (HOSPITAL_COMMUNITY): Payer: Medicaid Other | Admitting: Occupational Therapy

## 2019-11-19 ENCOUNTER — Inpatient Hospital Stay (HOSPITAL_COMMUNITY): Payer: Medicaid Other

## 2019-11-19 MED ORDER — DICLOFENAC SODIUM 1 % EX GEL
2.0000 g | Freq: Three times a day (TID) | CUTANEOUS | Status: DC
  Administered 2019-11-19 – 2019-12-02 (×31): 2 g via TOPICAL
  Filled 2019-11-19: qty 100

## 2019-11-19 NOTE — Progress Notes (Signed)
Occupational Therapy Session Note  Patient Details  Name: Denise Leon MRN: [id number] Date of Birth: 10/01/1946  Today's Date: 11/19/2019 OT Individual Time: 5678-8933 OT Individual Time Calculation (min): 60 min   Short Term Goals: Week 1:  OT Short Term Goal 1 (Week 1): STGs= LTGs secondary to estimated short LOS  Skilled Therapeutic Interventions/Progress Updates:    Pt greeted sitting in recliner after finishing breakfast and agreeable to OT treatment session. Pt declined to shower, but agreeable to wash up and change clothes. Pt with some difficulty understanding therapist requiring OT to repeat herself multiple times. 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 Pt with difficulty reaching to wash R arm and could not reach far enough to wash R armpit. Verbal cues to complete bathing tasks fully. Hand over hand A to integrate R UE into bathing tasks. Worked on multiple sit<>stands at the sink with overall min A and verbal cues for technique. Pt needed mod A to don shirt 2.2 R hemiplegia and ROM deficits on L side. Educated on hemi techniques for threading pant legs with mod A to complete task. Pt brushed hair at the sink, then brought to day room for R UE NMR with wieght bearing towel pushes. Pt with shoulder, scapula, and pectoral activation. Pt also with active finger flex/ext enough to give pt soft yellow foam to work on grasp and release. Pt returned to room and completed stand-pivot back to recliner with min A> pt left with chair alarm on, call bell in reach, and needs met.  Therapy Documentation Precautions:  Precautions Precautions: Fall Precaution Comments: 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) Restrictions Weight Bearing Restrictions: No Pain:   Pt reports pain in L shoulder 2/2 frozen shoulder, no pain number given, but groans when using it.   Therapy/Group: Individual Therapy  Valma Cava 11/19/2019, 8:37 AM

## 2019-11-19 NOTE — Progress Notes (Signed)
Tylenol administered at 1957 due to elevated temperature of 101.1 orally. Tylenol was effective at 2045 T- 98.7.

## 2019-11-19 NOTE — Progress Notes (Signed)
[facility] PHYSICAL MEDICINE & REHABILITATION PROGRESS NOTE   Subjective/Complaints: Up in chair. Says her right foot is still tender with wb. Worst in the morning apparently.   ROS: Patient denies fever, rash, sore throat, blurred vision, nausea, vomiting, diarrhea, cough, shortness of breath or chest pain, headache, or mood change.    Objective:   No results found. Recent Labs    11/17/19 0547  WBC 11.4*  HGB 10.8*  HCT 32.3*  PLT 312   Recent Labs    11/17/19 0547  NA 139  K 4.0  CL 104  CO2 25  GLUCOSE 121*  BUN 25*  CREATININE 1.48*  CALCIUM 9.1    Intake/Output Summary (Last 24 hours) at 11/19/2019 0950 Last data filed at 11/19/2019 0700 Gross per 24 hour  Intake 360 ml  Output --  Net 360 ml     Physical Exam: Vital Signs Blood pressure (!) 146/72, pulse 78, temperature 98.8 F (37.1 C), resp. rate 19, weight 63.3 kg, SpO2 97 %. Constitutional: No distress . Vital signs reviewed. HEENT: EOMI, oral membranes moist Neck: supple Cardiovascular: RRR without murmur. No JVD    Respiratory: CTA Bilaterally without wheezes or rales. Normal effort    GI: BS +, non-tender, non-distended  Extremities: No clubbing, cyanosis, or edema Skin: No evidence of breakdown, no evidence of rash  Musc: mild pain with palpation along right medial arch remains present Neurologic: AOx3. Cranial nerves II through XII intact, motor 5/5 LUE and LLE. RUE tr-1 pec/deltoid, trace biceps and 1/5 wrist and finger flexors---stable. RLE: 2 to 2+/5 HF, KE and 4/5 ADF/PF. No obvious sensory abnl. Has reasonable receptive and expressive language function when speaking simply and clearly with her. Psych: Pleasant, cooperative.   Assessment/Plan: 1. 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  Physiatrist is providing close team supervision and 24 hour management of active medical  problems listed below.  Physiatrist and rehab team continue to assess barriers to discharge/monitor patient progress toward functional and medical goals  Care Tool:  Bathing  Bathing activity did not occur: Refused           Bathing assist       Upper Body Dressing/Undressing Upper body dressing   What is the patient wearing?: Hospital gown only    Upper body assist Assist Level: Minimal Assistance - Patient > 75%    Lower Body Dressing/Undressing Lower body dressing      What is the patient wearing?: Underwear/pull up     Lower body assist Assist for lower body dressing: Maximal Assistance - Patient 25 - 49%     Toileting Toileting    Toileting assist Assist for toileting: Moderate Assistance - Patient 50 - 74%     Transfers Chair/bed transfer  Transfers assist     Chair/bed transfer assist level: Moderate Assistance - Patient 50 - 74%     Locomotion Ambulation   Ambulation assist      Assist level: Minimal Assistance - Patient > 75% Assistive device: Walker-rolling Max distance: 40 ft   Walk 10 feet activity   Assist  Walk 10 feet activity did not occur: Safety/medical concerns  Assist level: Minimal Assistance - Patient > 75% Assistive device: Walker-rolling, Orthosis(R hand orthosis)   Walk 50 feet activity   Assist Walk 50 feet with 2 turns activity did not occur: Safety/medical concerns  Assist level: Minimal Assistance - Patient > 75% Assistive device:  Walker-rolling    Walk 150 feet activity   Assist Walk 150 feet activity did not occur: Safety/medical concerns         Walk 10 feet on uneven surface  activity   Assist Walk 10 feet on uneven surfaces activity did not occur: Safety/medical concerns         Wheelchair     Assist Will patient use wheelchair at discharge?: No             Wheelchair 50 feet with 2 turns activity    Assist            Wheelchair 150 feet activity     Assist           Blood pressure (!) 146/72, pulse 78, temperature 98.8 F (37.1 C), resp. rate 19, weight 63.3 kg, SpO2 97 %.  Medical Problem List and Plan: 1.Right side weakness with facial droopsecondary to patchy small infarct posterior left frontal region. Status post TPA. Plan 30 day event monitor. -patient may shower -ELOS/Goals: 10 to 14 days  --Continue CIR therapies including PT, OT, and SLP  2. Antithrombotics: -DVT/anticoagulation:Lovenox. Lower extremity Dopplers negative -antiplatelet therapy: Aspirin 325 mg daily and Plavix 75 mg daily 3. Pain Management:Neurontin 400 mg twice daily, Lidoderm patch  -Ice prn to right medial arch  2/4 -will add voltaren gel to medial arch  -needs to be stretched. Needs to ambulate with shoes from home    4. Mood:Melatonin 3 mg nightly -antipsychotic agents: N/A 5. Neuropsych: This patientiscapable of making decisions on herown behalf. 6. Skin/Wound Care:Routine skin checks 7. Fluids/Electrolytes/Nutrition:encourage PO  -I personally reviewed the patient's labs today.   8. Hypertension. Toprol-XL 25 mg daily. Monitor with increased mobility  2/4 reasonable control today 9. CKD stage III. Follow-up chemistries-->Cr at 1.48 likely near baseline 10. Hyperlipidemia. Lipitor 11. Constipation: 2/3: Patient denies constipation but as per OT note attempted to have BM this AM and was unable to. Will add prn colace.     2/4 had bm this morning  LOS: 3 days A FACE TO FACE EVALUATION WAS PERFORMED  Meredith Staggers 11/19/2019, 9:50 AM

## 2019-11-19 NOTE — Care Management (Signed)
Inpatient Rehabilitation Center Individual Statement of Services  Patient Name:  Nithya Meriweather  Date:  11/19/2019  Welcome to the Inpatient Rehabilitation Center.  Our goal is to provide you with an individualized program based on your diagnosis and situation, designed to meet your specific needs.  With this comprehensive rehabilitation program, you will be expected to participate in at least 3 hours of rehabilitation therapies Monday-Friday, with modified therapy programming on the weekends.  Your rehabilitation program will include the following services:  Physical Therapy (PT), Occupational Therapy (OT), Speech Therapy (ST), 24 hour per day rehabilitation nursing, Therapeutic Recreaction (TR), Neuropsychology, Case Management (Social Worker), Rehabilitation Medicine, Nutrition Services and Pharmacy Services  Weekly team conferences will be held on Tuesdays to discuss your progress.  Your Social Worker will talk with you frequently to get your input and to update you on team discussions.  Team conferences with you and your family in attendance may also be held.  Expected length of stay: 10-14 days   Overall anticipated outcome: supervision  Depending on your progress and recovery, your program may change. Your Social Worker will coordinate services and will keep you informed of any changes. Your Social Worker's name and contact numbers are listed  below.  The following services may also be recommended but are not provided by the Inpatient Rehabilitation Center:   Driving Evaluations  Home Health Rehabiltiation Services  Outpatient Rehabilitation Services   Arrangements will be made to provide these services after discharge if needed.  Arrangements include referral to agencies that provide these services.  Your insurance has been verified to be:  Medicaid Your primary doctor is:    Pertinent information will be shared with your doctor and your insurance company.  Social Worker:  [place], [place] [phone] or ([phone]   Information discussed with and copy given to patient by: Amada Jupiter, 11/19/2019, 12:03 PM

## 2019-11-19 NOTE — Plan of Care (Signed)
  Problem: Consults Goal: RH STROKE PATIENT EDUCATION Description: See Patient Education module for education specifics  Outcome: Progressing   Problem: RH BOWEL ELIMINATION Goal: RH STG MANAGE BOWEL WITH ASSISTANCE Description: STG Manage Bowel with Assistance. Outcome: Progressing Goal: RH STG MANAGE BOWEL W/MEDICATION W/ASSISTANCE Description: STG Manage Bowel with Medication with Assistance. Outcome: Progressing   Problem: RH BLADDER ELIMINATION Goal: RH STG MANAGE BLADDER WITH ASSISTANCE Description: STG Manage Bladder With Assistance Outcome: Progressing   Problem: RH SKIN INTEGRITY Goal: RH STG SKIN FREE OF INFECTION/BREAKDOWN Outcome: Progressing Goal: RH STG MAINTAIN SKIN INTEGRITY WITH ASSISTANCE Description: STG Maintain Skin Integrity With Assistance. Outcome: Progressing   Problem: RH SAFETY Goal: RH STG ADHERE TO SAFETY PRECAUTIONS W/ASSISTANCE/DEVICE Description: STG Adhere to Safety Precautions With Assistance/Device. Outcome: Progressing   Problem: RH PAIN MANAGEMENT Goal: RH STG PAIN MANAGED AT OR BELOW PT'S PAIN GOAL Outcome: Progressing   Problem: RH KNOWLEDGE DEFICIT Goal: RH STG INCREASE KNOWLEDGE OF DYSPHAGIA/FLUID INTAKE Outcome: Progressing Goal: RH STG INCREASE KNOWLEDGE OF STROKE PROPHYLAXIS Outcome: Progressing

## 2019-11-19 NOTE — Progress Notes (Signed)
Social Work Patient ID: Denise Leon, female   DOB: Apr 24, 1946, 74 y.o.   MRN: [id number]   Have reviewed team conference with pt and daughter.  Both aware of ELS of 2 weeks and supervision goals overall.  Pt concerned about having needed assistance at home and daughter aware of this - will discuss further.  Abisai Deer, LCSW

## 2019-11-19 NOTE — Progress Notes (Signed)
Speech Language Pathology Daily Session Note  Patient Details  Name: Denise Leon MRN: [id number] Date of Birth: 03-Jan-1946  Today's Date: 11/19/2019 SLP Individual Time: 1000-1053 SLP Individual Time Calculation (min): 53 min  Short Term Goals: Week 1: SLP Short Term Goal 1 (Week 1): Patient will consume regular textures with thin liquids with minimal overt s/s of aspiration with Mod I. SLP Short Term Goal 2 (Week 1): Patient will recall new, daily information with Mod I for use of compensatory strategies. SLP Short Term Goal 3 (Week 1): Patient will demonstrate complex problem solving with functional tasks with Mod I.  Skilled Therapeutic Interventions: Skilled treatment session focused on cognitive goals. Upon arrival, patient requested to use the bathroom. SLP facilitated task by providing extra time and Min A verbal cues for problem solving and safety with task which was further exacerbated by pain in her right foot. Patient perseverating on being cold and having a "neck ache." RN aware, tempeture taken (due to low grade temp earlier this morning) and pain medications were administered. Patient reporting her pants felt tight and required Max verbal cues on how to reposition her waistband to maximize comfort. SLP also facilitated session by providing Max A verbal cues for recall of her current medications and their functions. Patient requested to have the medication melatonin discharged as she believed it caused her stroke. PA aware. Overall, patient appeared more lethargic and cognitively impaired this session compared to her evaluation. Patient left upright in recliner with all needs within reach. Continue with current plan of care.       Pain No/Denies Pain   Therapy/Group: Individual Therapy  Kellie Chisolm 11/19/2019, 2:42 PM

## 2019-11-19 NOTE — Progress Notes (Signed)
Physical Therapy Session Note  Patient Details  Name: Denise Leon MRN: [id number] Date of Birth: Aug 04, 1946  Today's Date: 11/19/2019 PT Individual Time: 1400-1500 PT Individual Time Calculation (min): 60 min   Short Term Goals: Week 1:  PT Short Term Goal 1 (Week 1): Pt will ambulate 50 ft with LRAD & supervision. PT Short Term Goal 2 (Week 1): Pt will negotiate 4 steps with rails & CGA. PT Short Term Goal 3 (Week 1): Pt will complete bed<>w/c with CGA with LRAD.  Skilled Therapeutic Interventions/Progress Updates:     Patient in recliner in room upon PT arrival. Patient alert and agreeable to PT session. Patient denied pain during session.  Therapeutic Activity: Bed Mobility: Patient performed sit to supine with min A for R LE management.  Transfers: Patient performed sit to/from stand x2 and stand pivot x1 with min A without an AD and sit to stand x1 with CGA using a RW with R hand splint. Provided verbal cues for hand placement and forward weight shift.  Gait Training:  Patient ambulated 20 feet x2 without an AD with min A with manual facilitation for weight shift to R. She then ambulated 26 feet using RW with CGA using a RW with R hand splint. Ambulated with decreased gait speed, decreased step length, decreased R weight shift, and forward trunk lean. Provided verbal cues for erect posture and increased step height and weight shift on R.  Wheelchair Mobility:  Patient propelled wheelchair 75 feet with min A progressing to supervision. She initially propelled with L hemi-technique with increased difficulty steering, however, switched to L UE and B LEs and she progressed to supervision. Provided verbal cues for steering technique and propulsion technique with LEs.  Neuromuscular Re-ed: Patient performed the following R UE activities in sitting for increased motor control and activation: -shoulder elevation 2x10 AROM -elbow flexion/extension 2x5 with AAROM, patient able to initiate  movement and activate with ~50% assist throughout range -hand grip and extension 2x5 with hand-over-hand assist initially then patient was independent with grip and 50% activation with extension Provided PNF tapping and multimodal cues to illicit motor activation  Patient in bed at end of session with breaks locked, bed alarm set, and all needs within reach.    Therapy Documentation Precautions:  Precautions Precautions: Fall Precaution Comments: 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) Restrictions Weight Bearing Restrictions: No    Therapy/Group: Individual Therapy  Javia Dillow L Bless Lisenby PT, DPT  11/19/2019, 5:55 PM

## 2019-11-19 NOTE — Progress Notes (Signed)
Social Work Assessment and Plan   Patient Details  Name: Denise Leon MRN: [id number] Date of Birth: [birth year]  Today's Date: 11/19/2019  Problem List:  Patient Active Problem List   Diagnosis Date Noted  . Palpitations 11/16/2019  . Essential hypertension 11/16/2019  . Hyperlipidemia 11/16/2019  . CKD (chronic kidney disease), stage IIIb 11/16/2019  . Thyroid nodule, L 11/16/2019  . 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 Stroke ([facility]) 11/14/2019  . Acute ischemic stroke ([facility]) patchy L frontal infarcts s/p tPA 11/14/2019   Past Medical History:  Past Medical History:  Diagnosis Date  . Asthma   . Hypertension    Past Surgical History:  Past Surgical History:  Procedure Laterality Date  . HIP SURGERY    . NECK SURGERY     Social History:  reports that she has never smoked. She has never used smokeless tobacco. She reports that she does not drink alcohol or use drugs.  Family / Support Systems Marital Status: Widow/Widower How Long?: ~ 3 yrs Patient Roles: Parent, Other (Comment)(grandparent) Children: daughter, Denise Leon @ [phone];  has 3 adult children living in [place] Other Supports: none Anticipated Caregiver: daughter Denise Leon) Ability/Limitations of Caregiver: Daughter works but son in Sports coach is in the home, however, he is caring for their 73 yo. Caregiver Availability: 24/7 Family Dynamics: Pt notes that she has a good relationship with daughter and son-in-law, however, that her son-in-law is caring for the child and "I am all alone.Marland KitchenMarland KitchenI am always alone."  Social History Preferred language: English Religion:  Cultural Background: Pt originally from the [place] and has been in the [place]. for 10 yrs.  Moved to [place] to live with her daughter after spouse died. Read: Yes Write: Yes Employment Status: Retired Public relations account executive Issues: None Guardian/Conservator: None - per MD, pt is capable of making decisions on his  own behalf.   Abuse/Neglect Abuse/Neglect Assessment Can Be Completed: Yes Physical Abuse: Denies Verbal Abuse: Denies Sexual Abuse: Denies Exploitation of patient/patient's resources: Denies Self-Neglect: Denies  Emotional Status Pt's affect, behavior and adjustment status: Pt lying in bed and is able to complete assessment without difficulty.  She does express much worry about her help available at home.  Discussed with her that, per [facility] on CIR admission, there was documentation that the son-in-law would be the caregiver/ support when daughter at work.  Pt continues to state that she feels "alone" in the home and wished there were more people in the home to help her.  She also talks about wanting to return to the [place] where more family members live.  Will discuss further with daughter.  May benefit from neuropsychology consult while here. Recent Psychosocial Issues: Husband's death ~ 3 yrs ago Psychiatric History: None Substance Abuse History: None  Patient / Family Perceptions, Expectations & Goals Pt/Family understanding of illness & functional limitations: Pt and family with general understanding of her stroke and resulting functional limitations/ need for CIR. Premorbid pt/family roles/activities: Pt was independent Anticipated changes in roles/activities/participation: Per goals of supervision, will need increased support from family if they are able to do. Pt/family expectations/goals: Pt's goal is to return to the [place]  Community Resources Community Agencies: None Premorbid Home Care/DME Agencies: None Transportation available at discharge: yes Resource referrals recommended: Neuropsychology  Discharge Planning Living Arrangements: Children Support Systems: Children Type of Residence: Private residence Insurance Resources: [employer] (specify county) Museum/gallery curator Resources: Family Support, Radio broadcast assistant Screen Referred: No Living Expenses: Lives with  family Money  Management: Family Does the patient have any problems obtaining your medications?: No Home Management: pt and family share Patient/Family Preliminary Plans: Pt and daughter report plan for pt to return to daughter's home.  Discussing care needs further. Social Work Anticipated Follow Up Needs: HH/OP Expected length of stay: 10-14 days  Clinical Impression Elderly woman here following CVA. Originally, [place] and now living with her daughter and son-in-law.  Pt with concerns about support available at home as daughter is working and son-in-law is providing care to their 2 yo.  Daughter aware of pt concern and will discuss.  All aware of supervision goals.  Pt appears somewhat anxious about her situation and will likely benefit from neuropsychology consult - will monitor.    Denise Leon 11/19/2019, 11:58 AM

## 2019-11-20 ENCOUNTER — Inpatient Hospital Stay (HOSPITAL_COMMUNITY): Payer: Medicaid Other | Admitting: Physical Therapy

## 2019-11-20 ENCOUNTER — Inpatient Hospital Stay (HOSPITAL_COMMUNITY): Payer: Medicaid Other | Admitting: Speech Pathology

## 2019-11-20 ENCOUNTER — Inpatient Hospital Stay (HOSPITAL_COMMUNITY): Payer: Medicaid Other | Admitting: Occupational Therapy

## 2019-11-20 MED ORDER — DOCUSATE SODIUM 100 MG PO CAPS
100.0000 mg | ORAL_CAPSULE | Freq: Two times a day (BID) | ORAL | Status: DC
  Administered 2019-11-20 – 2019-12-04 (×27): 100 mg via ORAL
  Filled 2019-11-20 (×28): qty 1

## 2019-11-20 NOTE — Progress Notes (Signed)
  Physical Therapy Session Note  Patient Details  Name: Denise Leon MRN: [id number] Date of Birth: [birth year]  Today's Date: 11/20/2019 PT Individual Time: [phone] and 1502-1529 PT Individual Time Calculation (min): 70 min and 27 min  Short Term Goals: Week 1:  PT Short Term Goal 1 (Week 1): Pt will ambulate 50 ft with LRAD & supervision. PT Short Term Goal 2 (Week 1): Pt will negotiate 4 steps with rails & CGA. PT Short Term Goal 3 (Week 1): Pt will complete bed<>w/c with CGA with LRAD.  Skilled Therapeutic Interventions/Progress Updates:  Treatment 1: Pt received in recliner & agreeable to tx. Contacted pt's daughter via speakerphone & requested she bring in pt's clothing & tennis shoes. Pt's daughter reports pt experienced burning nerve pain in BLE prior to admission 2/2 nerve impingement in back. Pt's daughter also reports pt lives in a multilevel home but her bed/bath is on main level, and she has 1 step to enter the home, and uses a rollator at baseline. Provided pt with gown to cover backside but pt continues to perseverate on being cold throughout session. Pt completes stand pivot transfers without AD & min assist throughout session. Transported pt to gym via w/c dependent assist for time management. Pt utilized dynavision while standing 2 minutes + 2 minutes with CGA while engaging in dynavision with focus on standing balance & reaching across midline with LUE, and standing tolerance (pt requires encouragement to complete task as she reports "I'm tired" during each trial). From EOM pt performed blocked practice of sit<>stand with LUE then no UE support with task focusing on BLE strengthening with pt fatiguing & reporting pain in R foot during activity so rest breaks provided PRN. When performing STS without BUE support therapist provides multimodal cuing for increased anterior weight shifting & pt demonstrates significant reliance on BLE on mat table 2/2 weak glutes/hip extensors. Pt  utilized cybex kinetron in sitting then standing with BUE support with task focusing on BLE strengthening, R NMR, weight shifting & dynamic balance; pt required rest breaks 2/2 R foot pain during activity. At end of session pt left in recliner with BLE elevated, chair alarm donned & call bell & all needs in reach.  Treatment 2: Pt received in bed requiring encouragement for participation. Pt scoots to [facility] with bed flat with supervision and multiple scoots with use of BLE only as pt unable to reach & hold to bed rails with BUE (RUE hemiparesis & LUE frozen shoulder). Provided pt with sheet & pt performed RLE calf stretch with cuing to hold stretch 60 seconds x 3. Pt performed BLE bridging with adductor hold, 10 reps x 2 sets, with multimodal cuing for technique with task focusing on BLE glute/hip strengthening & RLE NMR & pt unable to achieve full hip extension. At end of session pt left in bed with alarm set & all needs in reach. Pt c/o unrated pain in LUE with rest breaks provided PRN.  Pt does not recall therapist & pt contacting daughter this morning re: bringing in clothes for pt.   Therapy Documentation Precautions:  Precautions Precautions: Fall Precaution Comments: 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) Restrictions Weight Bearing Restrictions: No     Therapy/Group: Individual Therapy  Sandi Mariscal 11/20/2019, 3:37 PM

## 2019-11-20 NOTE — Progress Notes (Signed)
Occupational Therapy Session Note  Patient Details  Name: Denise Leon MRN: [id number] Date of Birth: 06/09/1946  Today's Date: 11/20/2019 OT Individual Time: 1303-1400 OT Individual Time Calculation (min): 57 min   Short Term Goals: Week 1:  OT Short Term Goal 1 (Week 1): STGs= LTGs secondary to estimated short LOS  Skilled Therapeutic Interventions/Progress Updates:    Pt greeted semi-reclined in bed and agreeable to OT treatment session. Pt declined any bathing/dressing despite encouragement. 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 OT applied muscle rub and provided gentle massage and ROM to shoulder with minimal improvement for pain. Pt completed stand-pivot to wc with min A. Pt brought down to therapy gym and worked on [place]. Weight bearing towel pushes up medium sized wedge with improved shoulder activation noted. Pt with some improved grasp so progressed to cup stacking activity with pt able to grasp cup with OT assist for finger extension. Pt then able to push cup accorss midline to L side, but needed OT assist to then lift cup for stacking. Pt reported need to go to the bathroom. Pt returned to room in wc for urgency, min A stand-pivot to toilet with use of grab bars. Min A for balance when pulling down underwear. Pt voided bladder and performed peri-care. Pt  Then washed hands at the sink with OT assist to position R UE. Stand-pivot back to bed with min A. Pt left semi-reclined in bed with bed alarm on, call bell in reach, and needs met.   Therapy Documentation Precautions:  Precautions Precautions: Fall Precaution Comments: 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) Restrictions Weight Bearing Restrictions: No Pain:  pt did not give a number, but reported pain at L shoulder 2/2 frozen shoulder. Muscle rub, ROM, and massage for pain management. Therapy/Group: Individual Therapy  Valma Cava 11/20/2019, 2:00 PM

## 2019-11-20 NOTE — Progress Notes (Signed)
[facility] PHYSICAL MEDICINE & REHABILITATION PROGRESS NOTE   Subjective/Complaints: Up in chair sleeping. No new problems. Right foot still sensitive along arch.   ROS: Patient denies fever, rash, sore throat, blurred vision, nausea, vomiting, diarrhea, cough, shortness of breath or chest pain, joint or back pain, headache, or mood change.   Objective:   No results found. No results for input(s): WBC, HGB, HCT, PLT in the last 72 hours. No results for input(s): NA, K, CL, CO2, GLUCOSE, BUN, CREATININE, CALCIUM in the last 72 hours.  Intake/Output Summary (Last 24 hours) at 11/20/2019 0954 Last data filed at 11/20/2019 0900 Gross per 24 hour  Intake 460 ml  Output 225 ml  Net 235 ml     Physical Exam: Vital Signs Blood pressure 132/74, pulse 79, temperature 98.5 F (36.9 C), resp. rate 18, weight 63.3 kg, SpO2 99 %. Constitutional: No distress . Vital signs reviewed. HEENT: EOMI, oral membranes moist Neck: supple Cardiovascular: RRR without murmur. No JVD    Respiratory: CTA Bilaterally without wheezes or rales. Normal effort    GI: BS +, non-tender, non-distended  Extremities: No clubbing, cyanosis, or edema Skin: No evidence of breakdown, no evidence of rash  Musc: right medial arch pain with palpation and dorsiflexion.  Neurologic: AOx3. Cranial nerves II through XII intact, motor 5/5 LUE and LLE. RUE tr-1 pec/deltoid, trace biceps and 1/5 wrist and finger flexors---stable. RLE: 2 to 2+/5 HF, KE and 4/5 ADF/PF. No obvious sensory abnl. Has reasonable receptive and expressive language function when conversation is basic and words are pronounced clearly. Psych: Pleasant, cooperative.   Assessment/Plan: 1. 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  Physiatrist is providing close team supervision and 24 hour management of active medical problems listed  below.  Physiatrist and rehab team continue to assess barriers to discharge/monitor patient progress toward functional and medical goals  Care Tool:  Bathing  Bathing activity did not occur: Refused Body parts bathed by patient: Chest, Abdomen, Front perineal area, Right upper leg, Left upper leg, Face   Body parts bathed by helper: Right arm, Left arm, Buttocks, Left lower leg, Right lower leg     Bathing assist Assist Level: Moderate Assistance - Patient 50 - 74%     Upper Body Dressing/Undressing Upper body dressing   What is the patient wearing?: Pull over shirt    Upper body assist Assist Level: Moderate Assistance - Patient 50 - 74%    Lower Body Dressing/Undressing Lower body dressing      What is the patient wearing?: Underwear/pull up, Pants     Lower body assist Assist for lower body dressing: Moderate Assistance - Patient 50 - 74%     Toileting Toileting    Toileting assist Assist for toileting: Moderate Assistance - Patient 50 - 74%     Transfers Chair/bed transfer  Transfers assist     Chair/bed transfer assist level: Minimal Assistance - Patient > 75%     Locomotion Ambulation   Ambulation assist      Assist level: Minimal Assistance - Patient > 75% Assistive device: Walker-rolling Max distance: 40 ft   Walk 10 feet activity   Assist  Walk 10 feet activity did not occur: Safety/medical concerns  Assist level: Minimal Assistance - Patient > 75% Assistive device: Walker-rolling, Orthosis(R hand orthosis)   Walk 50 feet activity   Assist Walk 50 feet with 2 turns activity did not occur:  Safety/medical concerns  Assist level: Minimal Assistance - Patient > 75% Assistive device: Walker-rolling    Walk 150 feet activity   Assist Walk 150 feet activity did not occur: Safety/medical concerns         Walk 10 feet on uneven surface  activity   Assist Walk 10 feet on uneven surfaces activity did not occur: Safety/medical  concerns         Wheelchair     Assist Will patient use wheelchair at discharge?: No             Wheelchair 50 feet with 2 turns activity    Assist            Wheelchair 150 feet activity     Assist          Blood pressure 132/74, pulse 79, temperature 98.5 F (36.9 C), resp. rate 18, weight 63.3 kg, SpO2 99 %.  Medical Problem List and Plan: 1.Right side weakness with facial droopsecondary to patchy small infarct posterior left frontal region. Status post TPA. Plan 30 day event monitor. -patient may shower -ELOS/Goals: 10 to 14 days  --Continue CIR therapies including PT, OT, and SLP  2. Antithrombotics: -DVT/anticoagulation:Lovenox. Lower extremity Dopplers negative -antiplatelet therapy: Aspirin 325 mg daily and Plavix 75 mg daily 3. Pain Management:Neurontin 400 mg twice daily, Lidoderm patch  -Ice prn to right medial arch  2/4 -added voltaren for medial arch pain. ?plantar fasciitis   2/5-foot/arch needs to be stretched. Needs to ambulate with shoes from home. Still hasn't received shoes yet.     4. Mood:Melatonin 3 mg nightly -antipsychotic agents: N/A 5. Neuropsych: This patientiscapable of making decisions on herown behalf. 6. Skin/Wound Care:Routine skin checks 7. Fluids/Electrolytes/Nutrition:encourage PO    -intake is solid 8. Hypertension. Toprol-XL 25 mg daily. Monitor with increased mobility  2/5 reasonable control today 9. CKD stage III. Follow-up chemistries-->Cr at 1.48 likely near baseline 10. Hyperlipidemia. Lipitor 11. Constipation:  Improving  -will schedule colace bid     2/5- had bm this morning  LOS: 4 days A FACE TO FACE EVALUATION WAS PERFORMED  Ranelle Oyster 11/20/2019, 9:54 AM

## 2019-11-20 NOTE — IPOC Note (Signed)
Overall Plan of Care [facility]) Patient Details Name: Denise Leon MRN: [id number] DOB: 1946-01-30  Admitting Diagnosis: Acute ischemic stroke [facility])  Hospital Problems: Principal Problem:   Acute ischemic stroke (HCC) patchy L frontal infarcts s/p tPA Active Problems:   Left middle cerebral artery stroke [facility])     Functional Problem List: Nursing Bladder, Bowel  PT Balance, Behavior, Safety, Endurance, Motor, Pain  OT Balance, Cognition, Endurance, Motor, Pain, Safety, Sensory  SLP    TR         Basic ADL's: OT Eating, Bathing, Grooming, Dressing, Toileting     Advanced  ADL's: OT       Transfers: PT Bed Mobility, Bed to Chair, Car, Furniture, Floor  OT Toilet, Research scientist (life sciences): PT Ambulation, Psychologist, prison and probation services, Stairs     Additional Impairments: OT Fuctional Use of Upper Extremity  SLP Swallowing, Social Cognition   Problem Solving, Memory  TR      Anticipated Outcomes Item Anticipated Outcome  Self Feeding set up A  Swallowing  Mod I   Basic self-care  supervision  Toileting  supervision   Bathroom Transfers supervision  Bowel/Bladder  to be continent x 2, LBM 11/15/19  Transfers  supervision with LRAD  Locomotion  supervision with LRAD, ambulatory  Communication     Cognition  Mod I  Pain  less than 2  Safety/Judgment  to be fall free while in rehab   Therapy Plan: PT Intensity: Minimum of 1-2 x/day ,45 to 90 minutes PT Frequency: 5 out of 7 days PT Duration Estimated Length of Stay: 10-14 days OT Intensity: Minimum of 1-2 x/day, 45 to 90 minutes OT Frequency: 5 out of 7 days OT Duration/Estimated Length of Stay: 2 weeks SLP Intensity: Minumum of 1-2 x/day, 30 to 90 minutes SLP Frequency: 3 to 5 out of 7 days SLP Duration/Estimated Length of Stay: 12-14 days   Due to the current state of emergency, patients may not be receiving their 3-hours of Medicare-mandated therapy.   Team Interventions: Nursing Interventions  Patient/Family Education, Disease Management/Prevention, Discharge Planning, Bladder Management, Bowel Management, Dysphagia/Aspiration Precaution Training  PT interventions Ambulation/gait training, Community reintegration, DME/adaptive equipment instruction, Neuromuscular re-education, Psychosocial support, Stair training, UE/LE Strength taining/ROM, Wheelchair propulsion/positioning, UE/LE Coordination activities, Therapeutic Activities, Pain management, Skin care/wound management, Functional electrical stimulation, Discharge planning, Cognitive remediation/compensation, Warden/ranger, Disease management/prevention, Functional mobility training, Patient/family education, Splinting/orthotics, Therapeutic Exercise, Visual/perceptual remediation/compensation  OT Interventions Warden/ranger, DME/adaptive equipment instruction, Patient/family education, Therapeutic Activities, Wheelchair propulsion/positioning, Cognitive remediation/compensation, Functional electrical stimulation, Psychosocial support, Therapeutic Exercise, Community reintegration, Functional mobility training, Self Care/advanced ADL retraining, UE/LE Strength taining/ROM, Discharge planning, Neuromuscular re-education, UE/LE Coordination activities, Pain management  SLP Interventions Cognitive remediation/compensation, Dysphagia/aspiration precaution training, Internal/external aids, Therapeutic Activities, Environmental controls, Cueing hierarchy, Patient/family education, Functional tasks  TR Interventions    SW/CM Interventions Discharge Planning, Psychosocial Support, Patient/Family Education   Barriers to Discharge MD  Medical stability  Nursing      PT Home environment access/layout, Lack of/limited family support unsure of home setup as pt unable to provide accurate report, unsure if family can provide 24 hr supervision at d/c  OT Other (comments) language barrier??  SLP      SW       Team  Discharge Planning: Destination: PT-Home ,OT- Home , SLP-Home Projected Follow-up: PT-24 hour supervision/assistance(HHPT vs OPPT), OT-  Home health OT, 24 hour supervision/assistance, SLP-None Projected Equipment Needs: PT-To be determined, OT- To be determined, SLP-None recommended by SLP Equipment Details: PT- ,  OT-  Patient/family involved in discharge planning: PT- Patient,  OT-Patient, SLP-Patient  MD ELOS: 10-14 days Medical Rehab Prognosis:  Excellent Assessment: The patient has been admitted for CIR therapies with the diagnosis of left frontal infarct. The team will be addressing functional mobility, strength, stamina, balance, safety, adaptive techniques and equipment, self-care, bowel and bladder mgt, patient and caregiver education, cognition, language, NMR, visual-spatial awareness. Goals have been set at supervision for mobility and self-care and supervision to mod I with cognition and language.   Due to the current state of emergency, patients may not be receiving their 3 hours per day of Medicare-mandated therapy.    Meredith Staggers, MD, FAAPMR  p    See Team Conference Notes for weekly updates to the plan of care

## 2019-11-20 NOTE — Progress Notes (Signed)
Speech Language Pathology Daily Session Note  Patient Details  Name: Denise Leon MRN: [id number] Date of Birth: [birth year]  Today's Date: 11/20/2019 SLP Individual Time: 0623-7628 SLP Individual Time Calculation (min): 40 min  Short Term Goals: Week 1: SLP Short Term Goal 1 (Week 1): Patient will consume regular textures with thin liquids with minimal overt s/s of aspiration with Mod I. SLP Short Term Goal 2 (Week 1): Patient will recall new, daily information with Mod I for use of compensatory strategies. SLP Short Term Goal 3 (Week 1): Patient will demonstrate complex problem solving with functional tasks with Mod I.  Skilled Therapeutic Interventions: Skilled treatment session focused on cognitive goals. Upon arrival, patient requested to use the bathroom and required Min verbal cues for safety with task. SLP facilitated session by providing Min verbal cues for functional problem solving during a complex medication management task, especially in regards to opening lids with her LUE. Patient requested to brush her teeth at end of session and required Min verbal cues for problem solving with set-up. Patient left upright in recliner with alarm on and all needs within reach. Continue with current plan of care.      Pain No/Denies Pain   Therapy/Group: Individual Therapy  Merlin Golden 11/20/2019, 12:38 PM

## 2019-11-21 ENCOUNTER — Inpatient Hospital Stay (HOSPITAL_COMMUNITY): Payer: Medicaid Other | Admitting: Speech Pathology

## 2019-11-21 ENCOUNTER — Inpatient Hospital Stay (HOSPITAL_COMMUNITY): Payer: Medicaid Other | Admitting: Physical Therapy

## 2019-11-21 ENCOUNTER — Inpatient Hospital Stay (HOSPITAL_COMMUNITY): Payer: Medicaid Other | Admitting: Occupational Therapy

## 2019-11-21 MED ORDER — DULOXETINE HCL 30 MG PO CPEP
30.0000 mg | ORAL_CAPSULE | Freq: Every day | ORAL | Status: DC
  Administered 2019-11-21 – 2019-11-23 (×3): 30 mg via ORAL
  Filled 2019-11-21 (×3): qty 1

## 2019-11-21 NOTE — Progress Notes (Signed)
Physical Therapy Session Note  Patient Details  Name: Denise Leon MRN: [id number] Date of Birth: Jul 09, 1946  Today's Date: 11/21/2019 PT Individual Time: 4010-2725 PT Individual Time Calculation (min): 56 min   Short Term Goals: Week 1:  PT Short Term Goal 1 (Week 1): Pt will ambulate 50 ft with LRAD & supervision. PT Short Term Goal 2 (Week 1): Pt will negotiate 4 steps with rails & CGA. PT Short Term Goal 3 (Week 1): Pt will complete bed<>w/c with CGA with LRAD.  Skilled Therapeutic Interventions/Progress Updates:  Pt received in bed requiring encouragement to participate with pt continuing to report "I'm cold". Therapist educated pt on purpose of rehab & need to attempt tasks without assistance first as pt requires MAX encouragement to attempt supine>sit and ultimately only min assist overall to upright trunk. Pt has clothing present in room so therapist provides assistance for donning shirt, ted hose (very painful to don 2/2 BLE nerve pain), pants & shoes for time management. Pt requires encouragement to attempt sit>stand from EOB & min assist overall with pt not fully uprighting trunk. Pt reports need to use restroom so ambulates into bathroom with RW & min assist with significant forward trunk flexion on RW & pt demonstrating decreased step length BLE, decreased stride length, decreased weight shifting to R & occasional assistance for managing RW. Pt sits on toilet before pulling pants/underwear below hips requiring max instruction to do so & therapist assists with managing clothing over R hips. Pt with continent void on toilet & ambulates out of bathroom & performs hand hygiene standing at sink with pt heavily leaning on sink despite cuing to decrease this & max cuing to initiate hand hygiene. Pt returns to sitting in w/c & combs hair with set up assist with comb. Provided pt with 16x16 hemi height w/c and pt propels w/c ~100 ft with min assist<>supervision with BLE and cuing for technique. Pt  transfers w/c>recliner via stand pivot without AD & min assist. Therapist provided assistance for set up with meal tray. Pt left in recliner with BLE elevated, call bell in reach, set up with breakfast tray (clarified with nurse that pt does not require full supervision for meals).  Throughout session pt states "I do not understand" and upon questioning pt reports she doesn't always understand the [place]. Therapist will ask social worker again to schedule an interpreter to see if this helps with communication.  Therapy Documentation Precautions:  Precautions Precautions: Fall Precaution Comments: 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) Restrictions Weight Bearing Restrictions: No    Pain: Pt c/o LUE frozen shoulder pain, BLE burning/numbness during gait - rest breaks provided PRN & MD made aware of BLE pain. Pt c/o pain on R hand orthosis & therapist provided repositioning.   Therapy/Group: Individual Therapy  Sandi Mariscal 11/21/2019, 8:36 AM

## 2019-11-21 NOTE — Progress Notes (Signed)
Occupational Therapy Session Note  Patient Details  Name: Denise Leon MRN: [id number] Date of Birth: 1946-01-08  Today's Date: 11/21/2019 OT Individual Time: 7846-9629 OT Individual Time Calculation (min): 54 min    Short Term Goals: Week 1:  OT Short Term Goal 1 (Week 1): STGs= LTGs secondary to estimated short LOS  Skilled Therapeutic Interventions/Progress Updates:    Upon entering the room, pt seated in recliner chair finishing lunch with no c/o pain this session. Pt asking multiple times for therapist to repeat questions or statements and saying, " I do not understand". When asked how I could help her to understanding and if an interpreter would help she replied , " No , I know english." Pt transferred from recliner chair with wheelchair with RW and min A. OT assisted pt via wheelchair to dayroom. Pt engaged in R UE NMR this session with focus on A/AROM in all planes of movement x 5 reps. Pt does fatigue quickly with these exercises and needing increased rest breaks. Pt able to pull pegs from resistive board with gross grasp noted this session and 3/8 drops. Pt then working on just grasping and releasing objects and needing mod cuing to open hand and close hand when needed. Increased time to initiate and sequence movements. 10 reps of abduction/adduction of R digits with increased difficulty with adduction and needing PROM to make movement. Pt returned to room in same manner as above and transferred back into recliner chair with call bell and all needed items within reach. Min A stand pivot transfer. Pt declined toileting this session . Chair alarm belt donned and activated.   Therapy Documentation Precautions:  Precautions Precautions: Fall Precaution Comments: 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) Restrictions Weight Bearing Restrictions: No   Pain: Pain Assessment Pain Scale: 0-10 Pain Score: 0-No pain   Therapy/Group: Individual Therapy  Alen Bleacher 11/21/2019, 1:58 PM

## 2019-11-21 NOTE — Plan of Care (Signed)
  Problem: Consults Goal: RH STROKE PATIENT EDUCATION Description: See Patient Education module for education specifics  Outcome: Progressing   Problem: RH BOWEL ELIMINATION Goal: RH STG MANAGE BOWEL WITH ASSISTANCE Description: STG Manage Bowel with Assistance. Outcome: Progressing Goal: RH STG MANAGE BOWEL W/MEDICATION W/ASSISTANCE Description: STG Manage Bowel with Medication with Assistance. Outcome: Progressing   Problem: RH BLADDER ELIMINATION Goal: RH STG MANAGE BLADDER WITH ASSISTANCE Description: STG Manage Bladder With Assistance Outcome: Progressing   Problem: RH SKIN INTEGRITY Goal: RH STG SKIN FREE OF INFECTION/BREAKDOWN Outcome: Progressing Goal: RH STG MAINTAIN SKIN INTEGRITY WITH ASSISTANCE Description: STG Maintain Skin Integrity With Assistance. Outcome: Progressing   Problem: RH SAFETY Goal: RH STG ADHERE TO SAFETY PRECAUTIONS W/ASSISTANCE/DEVICE Description: STG Adhere to Safety Precautions With Assistance/Device. Outcome: Progressing   Problem: RH PAIN MANAGEMENT Goal: RH STG PAIN MANAGED AT OR BELOW PT'S PAIN GOAL Outcome: Progressing   Problem: RH KNOWLEDGE DEFICIT Goal: RH STG INCREASE KNOWLEDGE OF DYSPHAGIA/FLUID INTAKE Outcome: Progressing Goal: RH STG INCREASE KNOWLEDGE OF STROKE PROPHYLAXIS Outcome: Progressing   

## 2019-11-21 NOTE — Progress Notes (Signed)
[facility] PHYSICAL MEDICINE & REHABILITATION PROGRESS NOTE   Subjective/Complaints:  Pt reports no issues except RLE is still hurting a lot- describes it as numb AND burning- would like to try additional nerve pain meds.   Will try Duloxetine 30 mg nightly- due to mild renal impairment, don't want ot increase Gabapentin. .   ROS: Patient denies fever, rash, sore throat, blurred vision, nausea, vomiting, diarrhea, cough, shortness of breath or chest pain, joint or back pain, headache, or mood change.   Objective:   No results found. No results for input(s): WBC, HGB, HCT, PLT in the last 72 hours. No results for input(s): NA, K, CL, CO2, GLUCOSE, BUN, CREATININE, CALCIUM in the last 72 hours.  Intake/Output Summary (Last 24 hours) at 11/21/2019 1302 Last data filed at 11/20/2019 1900 Gross per 24 hour  Intake 60 ml  Output --  Net 60 ml     Physical Exam: Vital Signs Blood pressure (!) 153/86, pulse 94, temperature 99.1 F (37.3 C), resp. rate 16, weight 65.4 kg, SpO2 97 %. Constitutional: No distress . Vital signs reviewed. Sitting in manual w/c in hallway heading back to room from therapy, NAD; rubbing RLE constantly HEENT: EOMI, oral membranes moist Neck: supple Cardiovascular: RRR without murmur. No JVD    Respiratory: CTA Bilaterally without wheezes or rales. Normal effort    GI: BS +, non-tender, non-distended  Extremities: No clubbing, cyanosis, or edema Skin: No evidence of breakdown, no evidence of rash  Musc: right medial arch pain with palpation and dorsiflexion.  Neurologic: AOx3. Cranial nerves II through XII intact, motor 5/5 LUE and LLE. RUE tr-1 pec/deltoid, trace biceps and 1/5 wrist and finger flexors---stable. RLE: 2 to 2+/5 HF, KE and 4/5 ADF/PF. No obvious sensory abnl. Has reasonable receptive and expressive language function when conversation is basic and words are pronounced clearly. Psych: Pleasant, cooperative.   Assessment/Plan: 1. Functional deficits  secondary to left frontal infarct which require 3+ hours per day of interdisciplinary therapy in a comprehensive inpatient rehab setting.  Physiatrist is providing close team supervision and 24 hour management of active medical problems listed below.  Physiatrist and rehab team continue to assess barriers to discharge/monitor patient progress toward functional and medical goals  Care Tool:  Bathing  Bathing activity did not occur: Refused Body parts bathed by patient: Chest, Abdomen, Front perineal area, Right upper leg, Left upper leg, Face   Body parts bathed by helper: Right arm, Left arm, Buttocks, Left lower leg, Right lower leg     Bathing assist Assist Level: Moderate Assistance - Patient 50 - 74%     Upper Body Dressing/Undressing Upper body dressing   What is the patient wearing?: Pull over shirt    Upper body assist Assist Level: Moderate Assistance - Patient 50 - 74%    Lower Body Dressing/Undressing Lower body dressing      What is the patient wearing?: Underwear/pull up, Pants     Lower body assist Assist for lower body dressing: Moderate Assistance - Patient 50 - 74%     Toileting Toileting    Toileting assist Assist for toileting: Moderate Assistance - Patient 50 - 74%     Transfers Chair/bed transfer  Transfers assist     Chair/bed transfer assist level: Minimal Assistance - Patient > 75%     Locomotion Ambulation   Ambulation assist      Assist level: Minimal Assistance - Patient > 75% Assistive device: Walker-rolling Max distance: 20 ft   Walk 10 feet  activity   Assist  Walk 10 feet activity did not occur: Safety/medical concerns  Assist level: Minimal Assistance - Patient > 75% Assistive device: Walker-rolling, Orthosis   Walk 50 feet activity   Assist Walk 50 feet with 2 turns activity did not occur: Safety/medical concerns  Assist level: Minimal Assistance - Patient > 75% Assistive device: Walker-rolling    Walk 150  feet activity   Assist Walk 150 feet activity did not occur: Safety/medical concerns         Walk 10 feet on uneven surface  activity   Assist Walk 10 feet on uneven surfaces activity did not occur: Safety/medical concerns         Wheelchair     Assist Will patient use wheelchair at discharge?: No Type of Wheelchair: Manual    Wheelchair assist level: Minimal Assistance - Patient > 75%, Supervision/Verbal cueing Max wheelchair distance: 100 ft    Wheelchair 50 feet with 2 turns activity    Assist        Assist Level: Minimal Assistance - Patient > 75%, Supervision/Verbal cueing   Wheelchair 150 feet activity     Assist          Blood pressure (!) 153/86, pulse 94, temperature 99.1 F (37.3 C), resp. rate 16, weight 65.4 kg, SpO2 97 %.  Medical Problem List and Plan: 1.Right side weakness with facial droopsecondary to patchy small infarct posterior left frontal region. Status post TPA. Plan 30 day event monitor. -patient may shower -ELOS/Goals: 10 to 14 days  --Continue CIR therapies including PT, OT, and SLP  2. Antithrombotics: -DVT/anticoagulation:Lovenox. Lower extremity Dopplers negative -antiplatelet therapy: Aspirin 325 mg daily and Plavix 75 mg daily 3. Pain Management:Neurontin 400 mg twice daily, Lidoderm patch  -Ice prn to right medial arch  2/4 -added voltaren for medial arch pain. ?plantar fasciitis   2/5-foot/arch needs to be stretched. Needs to ambulate with shoes from home. Still hasn't received shoes yet.    2/6- got shoes- no improvement in RLE pain; just foot pain; will Add Duloxetine 30 mg QHS for nerve pain.  4. Mood:Melatonin 3 mg nightly -antipsychotic agents: N/A 5. Neuropsych: This patientiscapable of making decisions on herown behalf. 6. Skin/Wound Care:Routine skin checks 7. Fluids/Electrolytes/Nutrition:encourage PO    -intake is solid 8.  Hypertension. Toprol-XL 25 mg daily. Monitor with increased mobility  2/5 reasonable control today 9. CKD stage III. Follow-up chemistries-->Cr at 1.48 likely near baseline 10. Hyperlipidemia. Lipitor 11. Constipation:  Improving  -will schedule colace bid     2/5- had bm this morning  LOS: 5 days A FACE TO FACE EVALUATION WAS PERFORMED  Denise Leon 11/21/2019, 1:02 PM

## 2019-11-21 NOTE — Progress Notes (Signed)
Speech Language Pathology Daily Session Note  Patient Details  Name: Denise Leon MRN: [id number] Date of Birth: June 27, 1946  Today's Date: 11/21/2019 SLP Individual Time: 1430-1530 SLP Individual Time Calculation (min): 60 min  Short Term Goals: Week 1: SLP Short Term Goal 1 (Week 1): Patient will consume regular textures with thin liquids with minimal overt s/s of aspiration with Mod I. SLP Short Term Goal 2 (Week 1): Patient will recall new, daily information with Mod I for use of compensatory strategies. SLP Short Term Goal 3 (Week 1): Patient will demonstrate complex problem solving with functional tasks with Mod I.  Skilled Therapeutic Interventions:  Pt was seen for skilled ST targeting cognitive and dysphagia goals.  Pt was seated upright in the recliner upon therapist's arrival, awake, alert, and agreeable to participating in therapy.  Therapist facilitated the session with a functional snack of regular textures and thin liquids to monitor toleration of currently prescribed diet.  Pt consumed a graham cracker and peanut butter with sips of water with no overt s/s of aspiration and timely oral phase for clearing residual solids post swallow.  Recommend that pt remain on her currently prescribed diet.  Upon completion of meal, SLP facilitated the session with a semi-complex scheduling task to address problem solving goals.  Pt completed task with overall min assist verbal cues for attention to detail and task organization.  SLP also facilitated the session with a novel card game to address recall and problem solving goals.  Pt recalled task rules and procedures with supervision verbal and written cues and was able to plan and execute a problem solving strategy with min faded to supervision verbal cues.  Pt was left in recliner with call bell within reach.  Continue per current plan of care.    Pain Pain Assessment Pain Scale: 0-10 Pain Score: 0-No pain  Therapy/Group: Individual  Therapy  Prentiss Polio, Melanee Spry 11/21/2019, 4:23 PM

## 2019-11-22 NOTE — Progress Notes (Signed)
[facility] PHYSICAL MEDICINE & REHABILITATION PROGRESS NOTE   Subjective/Complaints:  Pt reports RLE still hurting/burning- and painful to even stretch her body when in bed. Hasn't noticed any side effects from Duloxetine so far.    Will try Duloxetine 30 mg nightly- due to mild renal impairment, don't want to increase Gabapentin. .   ROS: Patient denies fever, rash, sore throat, blurred vision, nausea, vomiting, diarrhea, cough, shortness of breath or chest pain, joint or back pain, headache, or mood change.   Objective:   No results found. No results for input(s): WBC, HGB, HCT, PLT in the last 72 hours. No results for input(s): NA, K, CL, CO2, GLUCOSE, BUN, CREATININE, CALCIUM in the last 72 hours.  Intake/Output Summary (Last 24 hours) at 11/22/2019 1238 Last data filed at 11/22/2019 1024 Gross per 24 hour  Intake 240 ml  Output -  Net 240 ml     Physical Exam: Vital Signs Blood pressure 131/72, pulse 98, temperature 98.9 F (37.2 C), resp. rate 18, weight 64.3 kg, SpO2 97 %. Constitutional: No distress . Vital signs reviewed. Laying facing door in bed; appropriate, wincing when stretched and then rubbed RLE HEENT: EOMI, oral membranes moist Neck: supple Cardiovascular: RRR without murmur. No JVD    Respiratory: CTA Bilaterally without wheezes or rales. Normal effort    GI: BS +, non-tender, non-distended  Extremities: No clubbing, cyanosis, or edema Skin: No evidence of breakdown, no evidence of rash  Musc: right medial arch pain with palpation and dorsiflexion.  Neurologic: AOx3. Cranial nerves II through XII intact, motor 5/5 LUE and LLE. RUE tr-1 pec/deltoid, trace biceps and 1/5 wrist and finger flexors---stable. RLE: 2 to 2+/5 HF, KE and 4/5 ADF/PF. No obvious sensory abnl. Has reasonable receptive and expressive language function when conversation is basic and words are pronounced clearly. Psych: Pleasant, cooperative; avoids speaking if possible.    Assessment/Plan: 1. 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  Physiatrist is providing close team supervision and 24 hour management of active medical problems listed below.  Physiatrist and rehab team continue to assess barriers to discharge/monitor patient progress toward functional and medical goals  Care Tool:  Bathing  Bathing activity did not occur: Refused Body parts bathed by patient: Chest, Abdomen, Front perineal area, Right upper leg, Left upper leg, Face   Body parts bathed by helper: Right arm, Left arm, Buttocks, Left lower leg, Right lower leg     Bathing assist Assist Level: Moderate Assistance - Patient 50 - 74%     Upper Body Dressing/Undressing Upper body dressing   What is the patient wearing?: Pull over shirt    Upper body assist Assist Level: Moderate Assistance - Patient 50 - 74%    Lower Body Dressing/Undressing Lower body dressing      What is the patient wearing?: Underwear/pull up, Pants     Lower body assist Assist for lower body dressing: Moderate Assistance - Patient 50 - 74%     Toileting Toileting    Toileting assist Assist for toileting: Moderate Assistance - Patient 50 - 74%     Transfers Chair/bed transfer  Transfers assist     Chair/bed transfer assist level: Minimal Assistance - Patient > 75%     Locomotion Ambulation   Ambulation assist      Assist level: Minimal Assistance - Patient > 75% Assistive device: Walker-rolling Max distance: 20 ft   Walk 10 feet  activity   Assist  Walk 10 feet activity did not occur: Safety/medical concerns  Assist level: Minimal Assistance - Patient > 75% Assistive device: Walker-rolling, Orthosis   Walk 50 feet activity   Assist Walk 50 feet with 2 turns activity did not occur: Safety/medical concerns  Assist level: Minimal Assistance - Patient >  75% Assistive device: Walker-rolling    Walk 150 feet activity   Assist Walk 150 feet activity did not occur: Safety/medical concerns         Walk 10 feet on uneven surface  activity   Assist Walk 10 feet on uneven surfaces activity did not occur: Safety/medical concerns         Wheelchair     Assist Will patient use wheelchair at discharge?: No Type of Wheelchair: Manual    Wheelchair assist level: Minimal Assistance - Patient > 75%, Supervision/Verbal cueing Max wheelchair distance: 100 ft    Wheelchair 50 feet with 2 turns activity    Assist        Assist Level: Minimal Assistance - Patient > 75%, Supervision/Verbal cueing   Wheelchair 150 feet activity     Assist          Blood pressure 131/72, pulse 98, temperature 98.9 F (37.2 C), resp. rate 18, weight 64.3 kg, SpO2 97 %.  Medical Problem List and Plan: 1.Right side weakness with facial droopsecondary to patchy small infarct posterior left frontal region. Status post TPA. Plan 30 day event monitor. -patient may shower -ELOS/Goals: 10 to 14 days  --Continue CIR therapies including PT, OT, and SLP  2. Antithrombotics: -DVT/anticoagulation:Lovenox. Lower extremity Dopplers negative -antiplatelet therapy: Aspirin 325 mg daily and Plavix 75 mg daily 3. Pain Management:Neurontin 400 mg twice daily, Lidoderm patch  -Ice prn to right medial arch  2/4 -added voltaren for medial arch pain. ?plantar fasciitis   2/5-foot/arch needs to be stretched. Needs to ambulate with shoes from home. Still hasn't received shoes yet.    2/6- got shoes- no improvement in RLE pain; just foot pain; will Add Duloxetine 30 mg QHS for nerve pain.   2/7- no side effects mentioned 4. Mood:Melatonin 3 mg nightly -antipsychotic agents: N/A 5. Neuropsych: This patientiscapable of making decisions on herown behalf. 6. Skin/Wound Care:Routine skin checks 7.  Fluids/Electrolytes/Nutrition:encourage PO    -intake is solid 8. Hypertension. Toprol-XL 25 mg daily. Monitor with increased mobility  2/7 reasonable control today- con't meds 9. CKD stage III. Follow-up chemistries-->Cr at 1.48 likely near baseline 10. Hyperlipidemia. Lipitor 11. Constipation:  Improving  -will schedule colace bid     2/5- had bm this morning  LOS: 6 days A FACE TO FACE EVALUATION WAS PERFORMED  Archit Leger 11/22/2019, 12:38 PM

## 2019-11-23 ENCOUNTER — Inpatient Hospital Stay (HOSPITAL_COMMUNITY): Payer: Medicaid Other | Admitting: Speech Pathology

## 2019-11-23 ENCOUNTER — Inpatient Hospital Stay (HOSPITAL_COMMUNITY): Payer: Medicaid Other | Admitting: Physical Therapy

## 2019-11-23 ENCOUNTER — Inpatient Hospital Stay (HOSPITAL_COMMUNITY): Payer: Medicaid Other | Admitting: Occupational Therapy

## 2019-11-23 LAB — CREATININE, SERUM
Creatinine, Ser: 1.3 mg/dL — ABNORMAL HIGH (ref 0.44–1.00)
GFR calc Af Amer: 47 mL/min — ABNORMAL LOW (ref >60)
GFR calc non Af Amer: 41 mL/min — ABNORMAL LOW (ref >60)

## 2019-11-23 MED ORDER — MEGESTROL ACETATE 400 MG/10ML PO SUSP
400.0000 mg | Freq: Two times a day (BID) | ORAL | Status: DC
  Administered 2019-11-23 – 2019-11-27 (×8): 400 mg via ORAL
  Filled 2019-11-23 (×8): qty 10

## 2019-11-23 MED ORDER — ENOXAPARIN SODIUM 40 MG/0.4ML ~~LOC~~ SOLN
40.0000 mg | SUBCUTANEOUS | Status: DC
  Administered 2019-11-23 – 2019-11-29 (×7): 40 mg via SUBCUTANEOUS
  Filled 2019-11-23 (×7): qty 0.4

## 2019-11-23 NOTE — Plan of Care (Signed)
  Problem: Consults Goal: RH STROKE PATIENT EDUCATION Description: See Patient Education module for education specifics  Outcome: Progressing   Problem: RH BOWEL ELIMINATION Goal: RH STG MANAGE BOWEL WITH ASSISTANCE Description: STG Manage Bowel with Assistance. Min Outcome: Progressing Goal: RH STG MANAGE BOWEL W/MEDICATION W/ASSISTANCE Description: STG Manage Bowel with Medication with Assistance. Min Outcome: Progressing   Problem: RH BLADDER ELIMINATION Goal: RH STG MANAGE BLADDER WITH ASSISTANCE Description: STG Manage Bladder With Assistance. Min Outcome: Progressing   Problem: RH SKIN INTEGRITY Goal: RH STG SKIN FREE OF INFECTION/BREAKDOWN Description: Skin will remain free of breakdown and infection while on rehab with min assist Outcome: Progressing Goal: RH STG MAINTAIN SKIN INTEGRITY WITH ASSISTANCE Description: STG Maintain Skin Integrity With Assistance. Min Outcome: Progressing   Problem: RH SAFETY Goal: RH STG ADHERE TO SAFETY PRECAUTIONS W/ASSISTANCE/DEVICE Description: STG Adhere to Safety Precautions With Assistance/Device. Mod I Outcome: Progressing   Problem: RH PAIN MANAGEMENT Goal: RH STG PAIN MANAGED AT OR BELOW PT'S PAIN GOAL Description: Less than 4 Outcome: Progressing   Problem: RH KNOWLEDGE DEFICIT Goal: RH STG INCREASE KNOWLEDGE OF DYSPHAGIA/FLUID INTAKE Description: Patient/caregiver will be able to verbalize safe swallowing for patient Outcome: Progressing Goal: RH STG INCREASE KNOWLEDGE OF STROKE PROPHYLAXIS Description: Patient/caregiver will be able to describe medicines to prevent stroke and how to take them, when to report to MD with cues/handouts Outcome: Progressing   

## 2019-11-23 NOTE — Progress Notes (Signed)
Speech Language Pathology Daily Session Note  Patient Details  Name: Denise Leon MRN: [id number] Date of Birth: [birth year]  Today's Date: 11/23/2019 SLP Individual Time: 0850-0930 SLP Individual Time Calculation (min): 40 min  Short Term Goals: Week 1: SLP Short Term Goal 1 (Week 1): Patient will consume regular textures with thin liquids with minimal overt s/s of aspiration with Mod I. SLP Short Term Goal 2 (Week 1): Patient will recall new, daily information with Mod I for use of compensatory strategies. SLP Short Term Goal 3 (Week 1): Patient will demonstrate complex problem solving with functional tasks with Mod I.  Skilled Therapeutic Interventions: Skilled treatment session focused on cognitive goals. Upon arrival, patient requested to use the bathroom. SLP facilitated session by providing supervision level verbal cues for safety with task in regards to hand placement. SLP also facilitated session by providing Min A verbal cues for problem solving during a 4 and 6 step picture sequencing task. Patient requested to return to bed and completed task with overall Min A. Patient left supine in bed with alarm on and all needs within reach. Continue with current plan of care.      Pain Pain Assessment Pain Scale: Faces Pain Score: 3  Pain Type: Acute pain Pain Location: Shoulder Pain Orientation: Left  Therapy/Group: Individual Therapy  Kami Kube 11/23/2019, 12:18 PM

## 2019-11-23 NOTE — Progress Notes (Signed)
Occupational Therapy Weekly Progress Note  Patient Details  Name: Denise Leon MRN: [id number] Date of Birth: September 27, 1946  Beginning of progress report period: November 17, 2019 End of progress report period: November 23, 2019  Today's Date: 11/23/2019 OT Individual Time: 1047-1200 OT Individual Time Calculation (min): 73 min    Pt is making steady progress towards OT goals. Pt is at an overall CGA/min A level for BADL tasks. R UE function has improved with more shoulder, elbow, wrist, hand activation, although pt has difficulty translating movements into functional tasks. Continue current plan in working towards supervision level goals.   Patient continues to demonstrate the following deficits: muscle weakness, impaired timing and sequencing, abnormal tone, unbalanced muscle activation, ataxia and decreased coordination, decreased awareness, decreased safety awareness and decreased memory and decreased sitting balance, decreased standing balance, decreased postural control, hemiplegia and decreased balance strategies and therefore will continue to benefit from skilled OT intervention to enhance overall performance with BADL, Reduce care partner burden and functional use of R UE .  Patient progressing toward long term goals..  Continue plan of care.  OT Short Term Goals Week 2:  OT Short Term Goal 1 (Week 1): STGs= LTGs secondary to estimated short LOS  Skilled Therapeutic Interventions/Progress Updates:    Pt greeted semi-reclined in bed, initially unmotivated to participate in OT, but agreeable with encouragement. Pt declined to shower, stating "tomorrow," but agreeable to sponge bathe a little and change clothes. Pt needed mod A to elevate trunk with bed mobility. Pt sat EOB and donned new shirt with min A and min verbal cues for hemi-dressing techniques. Sit<>stand at EOB witH CGA, then pt able to wash buttocks with set-up A. OT assist to don new brief in standing. Pt returned to sitting and  was able to thread pants, then needed min A to pull pants up over R hip. Pt maintained standing w/ RW, then ambulated to the sink with CGA and verbal cues for RW positioning at the sink. Pt tolerated standing to brush hair with close supervision for standing balance. Pt brought down to therapy gym in wc and ambulated 10 feet with RW, R hand splint, and CGA. Pt sat on therapy mat, then placed in supine. Used [place] to wrap R hand around dowel rod to maintain grip. Pt completed 3 sets of 10 chest press, wrist flex/ext, triceps extension, and bicep curls with rest breaks in between sets. Pt then ambulated 40 feet towards room with CGA and RW before reaching fatigue. OT pushed pt back to room where she completed stand-pivot to toilet with CGA. Pt able to manage clothing prior and complete peri-care after voiding bladder. Min A to then pull up pants. Pt washed hands and brushed teet at the sink with education for one handed technique. Pt left seated in recliner at end of session with alarm belt on, call bell in reach, and needs met.   Therapy Documentation Precautions:  Precautions Precautions: Fall Precaution Comments: 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) Restrictions Weight Bearing Restrictions: No Pain: Pain Assessment Pain Scale: Faces Pain Score: 3  Pain Type: Acute pain Pain Location: Shoulder Pain Orientation: Left   Therapy/Group: Individual Therapy  Valma Cava 11/23/2019, 11:47 AM

## 2019-11-23 NOTE — Progress Notes (Signed)
[facility] [place] PHYSICAL MEDICINE & REHABILITATION PROGRESS NOTE   Subjective/Complaints:  C/o poor appetite. Right foot still sore  ROS: Patient denies fever, rash, sore throat, blurred vision, nausea, vomiting, diarrhea, cough, shortness of breath or chest pain,  headache, or mood change.    Objective:   No results found. No results for input(s): WBC, HGB, HCT, PLT in the last 72 hours. Recent Labs    11/23/19 0457  CREATININE 1.30*    Intake/Output Summary (Last 24 hours) at 11/23/2019 1014 Last data filed at 11/23/2019 6270 Gross per 24 hour  Intake 240 ml  Output --  Net 240 ml     Physical Exam: Vital Signs Blood pressure (!) 144/73, pulse 92, temperature 98.8 F (37.1 C), resp. rate 18, weight 63.4 kg, SpO2 96 %. Constitutional: No distress . Vital signs reviewed. HEENT: EOMI, oral membranes moist Neck: supple Cardiovascular: RRR without murmur. No JVD    Respiratory: CTA Bilaterally without wheezes or rales. Normal effort    GI: BS +, non-tender, non-distended  Extremities: No clubbing, cyanosis, or edema Skin: No evidence of breakdown, no evidence of rash  Musc: right medial arch pain with palpation and dorsiflexion.  Neurologic: AOx3. Cranial nerves II through XII intact, motor 5/5 LUE and LLE. RUE tr-1 pec/deltoid, trace biceps and 1/5 wrist and finger flexors---stable. RLE: 2 to 2+/5 HF, KE and 4/5 ADF/PF. No obvious sensory abnl. Has reasonable receptive and expressive language function at present Psych: Pleasant a little anxious   Assessment/Plan: 1. 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  Physiatrist is providing close team supervision and 24 hour management of active medical problems listed below.  Physiatrist and rehab team continue to assess barriers to discharge/monitor patient progress toward functional and medical goals  Care  Tool:  Bathing  Bathing activity did not occur: Refused Body parts bathed by patient: Chest, Abdomen, Front perineal area, Right upper leg, Left upper leg, Face   Body parts bathed by helper: Right arm, Left arm, Buttocks, Left lower leg, Right lower leg     Bathing assist Assist Level: Moderate Assistance - Patient 50 - 74%     Upper Body Dressing/Undressing Upper body dressing   What is the patient wearing?: Pull over shirt    Upper body assist Assist Level: Moderate Assistance - Patient 50 - 74%    Lower Body Dressing/Undressing Lower body dressing      What is the patient wearing?: Underwear/pull up, Pants     Lower body assist Assist for lower body dressing: Moderate Assistance - Patient 50 - 74%     Toileting Toileting    Toileting assist Assist for toileting: Moderate Assistance - Patient 50 - 74%     Transfers Chair/bed transfer  Transfers assist     Chair/bed transfer assist level: Minimal Assistance - Patient > 75%     Locomotion Ambulation   Ambulation assist      Assist level: Minimal Assistance - Patient > 75% Assistive device: Walker-rolling Max distance: 20 ft   Walk 10 feet activity   Assist  Walk 10 feet activity did not occur: Safety/medical concerns  Assist level: Minimal Assistance - Patient > 75% Assistive device: Walker-rolling, Orthosis   Walk 50 feet activity   Assist Walk 50 feet with 2 turns activity did not occur: Safety/medical concerns  Assist level: Minimal Assistance - Patient > 75% Assistive device: Walker-rolling    Walk 150 feet  activity   Assist Walk 150 feet activity did not occur: Safety/medical concerns         Walk 10 feet on uneven surface  activity   Assist Walk 10 feet on uneven surfaces activity did not occur: Safety/medical concerns         Wheelchair     Assist Will patient use wheelchair at discharge?: No Type of Wheelchair: Manual    Wheelchair assist level: Minimal  Assistance - Patient > 75%, Supervision/Verbal cueing Max wheelchair distance: 100 ft    Wheelchair 50 feet with 2 turns activity    Assist        Assist Level: Minimal Assistance - Patient > 75%, Supervision/Verbal cueing   Wheelchair 150 feet activity     Assist          Blood pressure (!) 144/73, pulse 92, temperature 98.8 F (37.1 C), resp. rate 18, weight 63.4 kg, SpO2 96 %.  Medical Problem List and Plan: 1.Right side weakness with facial droopsecondary to patchy small infarct posterior left frontal region. Status post TPA. Plan 30 day event monitor. -patient may shower -ELOS/Goals: 10 to 14 days  --Continue CIR therapies including PT, OT, and SLP  2. Antithrombotics: -DVT/anticoagulation:Lovenox. Lower extremity Dopplers negative -antiplatelet therapy: Aspirin 325 mg daily and Plavix 75 mg daily 3. Pain Management:Neurontin 400 mg twice daily, Lidoderm patch  -Ice prn to right medial arch  2/4 -added voltaren for medial arch pain. ?plantar fasciitis   2/5-foot/arch needs to be stretched. Needs to ambulate with shoes from home.      2/6- got shoes- no improvement in RLE pain; just foot pain; will Add Duloxetine 30 mg QHS for nerve pain.   2/7-8- pain perhaps a little better, but no substantial change 4. Mood:Melatonin 3 mg nightly -antipsychotic agents: N/A 5. Neuropsych: This patientiscapable of making decisions on herown behalf. 6. Skin/Wound Care:Routine skin checks 7. Fluids/Electrolytes/Nutrition:encourage PO    -intake has decreased over last few days  2/8-added megace short term (pt requested something for app) 8. Hypertension. Toprol-XL 25 mg daily. Monitor with increased mobility  2/8 reasonable control -- con't meds 9. CKD stage III. Follow-up chemistries-->Cr at 1.48 likely near baseline 10. Hyperlipidemia. Lipitor 11. Constipation:  Improving  -will schedule colace bid      2/7- had bm   LOS: 7 days A FACE TO FACE EVALUATION WAS PERFORMED  Ranelle Oyster 11/23/2019, 10:14 AM

## 2019-11-23 NOTE — Progress Notes (Signed)
Physical Therapy Session Note  Patient Details  Name: Denise Leon MRN: [id number] Date of Birth: Mar 17, 1946  Today's Date: 11/23/2019 PT Individual Time: 1418-1530 PT Individual Time Calculation (min): 72 min   Short Term Goals: Week 1:  PT Short Term Goal 1 (Week 1): Pt will ambulate 50 ft with LRAD & supervision. PT Short Term Goal 2 (Week 1): Pt will negotiate 4 steps with rails & CGA. PT Short Term Goal 3 (Week 1): Pt will complete bed<>w/c with CGA with LRAD.  Skilled Therapeutic Interventions/Progress Updates: Pt presents supine in bed , but arouseable and agreeable to participate w/ therapy.  Pt required min A for sup to sit transfer from right side-lying position.  Pt sat EOB w/o assist and PT donned shoes.  Pt performed multiple SPT during session w/ CGA and verbal and visual cues for hand placement and safety d/t language barrier?  Pt performed multiple sit to stand transfers w/ min A and cueing for left hand placement, including standing at sink to comb hair.  Pt performed standing marching, and Dyn[employer] trial reaching only on lower quadrants, crossing midline w/o LOB.  Pt tolerated Nu-step at level 2 for LE and left UE strength and endurance x 4', LEs only at level 1, 2 x 3-4'.  Pt requires seated rest breaks throughout session w/ O2 monitoring at 96% on RA.  Pt also amb multiple trials of 50-60' including turns to return to seat.  Pt returned to bed w/ supervision for sit to supine.  All needs within reach and bed alarm on.       Therapy Documentation Precautions:  Precautions Precautions: Fall Precaution Comments: 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) Restrictions Weight Bearing Restrictions: No General:   Vital Signs: Therapy Vitals Temp: 98.5 F (36.9 C) Pulse Rate: 80 Resp: 16 BP: (!) 155/76 Patient Position (if appropriate): Sitting Oxygen Therapy SpO2: 100 % O2 Device: Room Air Pain:c/o left shoulder pain " frozen shoulder".  Pt also c/o "heart burn" and made nursing aware.       Therapy/Group: Individual Therapy  Lucio Edward 11/23/2019, 3:46 PM

## 2019-11-24 ENCOUNTER — Inpatient Hospital Stay (HOSPITAL_COMMUNITY): Payer: Medicaid Other | Admitting: Speech Pathology

## 2019-11-24 ENCOUNTER — Inpatient Hospital Stay (HOSPITAL_COMMUNITY): Payer: Medicaid Other

## 2019-11-24 ENCOUNTER — Inpatient Hospital Stay (HOSPITAL_COMMUNITY): Payer: Medicaid Other | Admitting: Physical Therapy

## 2019-11-24 ENCOUNTER — Inpatient Hospital Stay (HOSPITAL_COMMUNITY): Payer: Medicaid Other | Admitting: Occupational Therapy

## 2019-11-24 MED ORDER — LIDOCAINE 5 % EX PTCH
2.0000 | MEDICATED_PATCH | CUTANEOUS | Status: DC
  Administered 2019-11-25 – 2019-11-28 (×4): 2 via TRANSDERMAL
  Filled 2019-11-24 (×5): qty 2

## 2019-11-24 MED ORDER — DULOXETINE HCL 30 MG PO CPEP
30.0000 mg | ORAL_CAPSULE | Freq: Every day | ORAL | Status: DC
  Administered 2019-11-25 – 2019-12-04 (×10): 30 mg via ORAL
  Filled 2019-11-24 (×10): qty 1

## 2019-11-24 NOTE — Progress Notes (Signed)
[facility] PHYSICAL MEDICINE & REHABILITATION PROGRESS NOTE   Subjective/Complaints:  Had a fair night. Still says she doesn't have much appetite. Right foot still sore but she's tolerating therapies.  Did not indicate pain with WB in PT yesterday  ROS: Patient denies fever, rash, sore throat, blurred vision, nausea, vomiting, diarrhea, cough, shortness of breath or chest pain,   headache, or mood change.    Objective:   No results found. No results for input(s): WBC, HGB, HCT, PLT in the last 72 hours. Recent Labs    11/23/19 0457  CREATININE 1.30*    Intake/Output Summary (Last 24 hours) at 11/24/2019 0950 Last data filed at 11/24/2019 0700 Gross per 24 hour  Intake 100 ml  Output --  Net 100 ml     Physical Exam: Vital Signs Blood pressure (!) 161/88, pulse 88, temperature 98.4 F (36.9 C), resp. rate 18, height 5\' 2"  (1.575 m), weight 63.4 kg, SpO2 98 %. Constitutional: No distress . Vital signs reviewed. HEENT: EOMI, oral membranes moist Neck: supple Cardiovascular: RRR without murmur. No JVD    Respiratory: CTA Bilaterally without wheezes or rales. Normal effort    GI: BS +, non-tender, non-distended  Extremities: No clubbing, cyanosis, or edema Skin: No evidence of breakdown, no evidence of rash  Musc: right medial arch pain with palpation and dorsiflexion.  Neurologic: AOx3. Cranial nerves II through XII intact, motor 5/5 LUE and LLE. RUE tr-1 pec/deltoid, trace biceps and 1/5 wrist and finger flexors---stable. RLE: 2 to 2+ to 3-/5 HF, KE and 4/5 ADF/PF. Senses pain and LT. No resting tone. Psych:pleasant but anxious  Assessment/Plan: 1. 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  Physiatrist is providing close team supervision and 24 hour management of active medical problems listed below.  Physiatrist and rehab team continue to assess barriers to  discharge/monitor patient progress toward functional and medical goals  Care Tool:  Bathing  Bathing activity did not occur: Refused Body parts bathed by patient: Chest, Abdomen, Front perineal area, Right upper leg, Left upper leg, Face   Body parts bathed by helper: Right arm, Left arm, Buttocks, Left lower leg, Right lower leg     Bathing assist Assist Level: Moderate Assistance - Patient 50 - 74%     Upper Body Dressing/Undressing Upper body dressing   What is the patient wearing?: Pull over shirt    Upper body assist Assist Level: Moderate Assistance - Patient 50 - 74%    Lower Body Dressing/Undressing Lower body dressing      What is the patient wearing?: Underwear/pull up, Pants     Lower body assist Assist for lower body dressing: Moderate Assistance - Patient 50 - 74%     Toileting Toileting    Toileting assist Assist for toileting: Moderate Assistance - Patient 50 - 74%     Transfers Chair/bed transfer  Transfers assist     Chair/bed transfer assist level: Contact Guard/Touching assist     Locomotion Ambulation   Ambulation assist      Assist level: Minimal Assistance - Patient > 75% Assistive device: Walker-rolling Max distance: 60'   Walk 10 feet activity   Assist  Walk 10 feet activity did not occur: Safety/medical concerns  Assist level: Minimal Assistance - Patient > 75% Assistive device: Walker-rolling   Walk 50 feet activity   Assist Walk 50 feet with 2 turns activity did not occur: Safety/medical concerns  Assist  level: Minimal Assistance - Patient > 75% Assistive device: Walker-rolling    Walk 150 feet activity   Assist Walk 150 feet activity did not occur: Safety/medical concerns         Walk 10 feet on uneven surface  activity   Assist Walk 10 feet on uneven surfaces activity did not occur: Safety/medical concerns         Wheelchair     Assist Will patient use wheelchair at discharge?: No Type of  Wheelchair: Manual    Wheelchair assist level: Minimal Assistance - Patient > 75%, Supervision/Verbal cueing Max wheelchair distance: 100 ft    Wheelchair 50 feet with 2 turns activity    Assist        Assist Level: Minimal Assistance - Patient > 75%, Supervision/Verbal cueing   Wheelchair 150 feet activity     Assist          Blood pressure (!) 161/88, pulse 88, temperature 98.4 F (36.9 C), resp. rate 18, height 5\' 2"  (1.575 m), weight 63.4 kg, SpO2 98 %.  Medical Problem List and Plan: 1.Right side weakness with facial droopsecondary to patchy small infarct posterior left frontal region. Status post TPA. Plan 30 day event monitor. -patient may shower -ELOS/Goals: 10 to 14 days  -Interdisciplinary Team Conference today   2. Antithrombotics: -DVT/anticoagulation:Lovenox. Lower extremity Dopplers negative -antiplatelet therapy: Aspirin 325 mg daily and Plavix 75 mg daily 3. Pain Management:Neurontin 400 mg twice daily, Lidoderm patch  -Ice prn to right medial arch  2/4 -added voltaren for medial arch pain. ?plantar fasciitis   2/5-foot/arch needs to be stretched. Needs to ambulate with shoes from home.      2/6- got shoes- no improvement in RLE pain; just foot pain; will Add Duloxetine 30 mg QHS for nerve pain.   2/7-9 pain seems better. May perseverate on it a bit. 4. Mood:Melatonin 3 mg nightly -antipsychotic agents: N/A 5. Neuropsych: This patientiscapable of making decisions on herown behalf. 6. Skin/Wound Care:Routine skin checks 7. Fluids/Electrolytes/Nutrition:encourage PO    -intake has decreased over last few days  2/8-added megace short term (pt requested something for app)  2/9 observe for increased intake, ate 75% for breakfast   -asked patient to push her intake a little 8. Hypertension. Toprol-XL 25 mg daily. Monitor with increased mobility  2/9 reasonable control -- con't meds 9.  CKD stage III. Follow-up chemistries-->Cr at 1.48 likely near baseline 10. Hyperlipidemia. Lipitor 11. Constipation:  Improving  - scheduled colace bid     2/7- had bms  LOS: 8 days A FACE TO FACE EVALUATION WAS PERFORMED  4/7 11/24/2019, 9:50 AM

## 2019-11-24 NOTE — Progress Notes (Signed)
Speech Language Pathology Daily Session Note  Patient Details  Name: Olanda Boughner MRN: [id number] Date of Birth: 09-25-1946  Today's Date: 11/24/2019 SLP Individual Time: 0700-0753 SLP Individual Time Calculation (min): 53 min  Short Term Goals: Week 1: SLP Short Term Goal 1 (Week 1): Patient will consume regular textures with thin liquids with minimal overt s/s of aspiration with Mod I. SLP Short Term Goal 2 (Week 1): Patient will recall new, daily information with Mod I for use of compensatory strategies. SLP Short Term Goal 3 (Week 1): Patient will demonstrate complex problem solving with functional tasks with Mod I.  Skilled Therapeutic Interventions: Skilled treatment session focused on dysphagia and cognitive goals. SLP facilitated session by providing skilled observation with breakfast meal of regular textures and thin liquids. Patient consumed meal without overt s/s of aspiration and was Mod I for use of swallowing compensatory strategies. Recommend patient continue current diet. SLP also facilitated session by providing Min A verbal cues for patient to generate a list of ingredients and sequence steps to a familiar recipe. Patient left upright in recliner with all needs within reach. Continue with current plan of care.       Pain No/Denies Pain   Therapy/Group: Individual Therapy  Saiya Crist 11/24/2019, 3:25 PM

## 2019-11-24 NOTE — Patient Care Conference (Signed)
Inpatient RehabilitationTeam Conference and Plan of Care Update Date: 11/24/2019   Time: 10:30 AM   Patient Name: Denise Leon      Medical Record Number: [id number]  Date of Birth: [birth year] Sex: Female         Room/Bed: 4W02C/4W02C-01 Payor Info: Payor: MEDICAID [place] / Plan: MEDICAID OF [place] / Product Type: *No Product type* /    Admit Date/Time:  11/16/2019  4:31 PM  Primary Diagnosis:  Acute ischemic stroke [facility])  Patient Active Problem List   Diagnosis Date Noted  . Palpitations 11/16/2019  . Essential hypertension 11/16/2019  . Hyperlipidemia 11/16/2019  . CKD (chronic kidney disease), stage IIIb 11/16/2019  . Thyroid nodule, L 11/16/2019  . 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 Stroke (HCC) 11/14/2019  . Acute ischemic stroke [facility]) patchy L frontal infarcts s/p tPA 11/14/2019    Expected Discharge Date: Expected Discharge Date: 12/04/19  Team Members Present: Physician leading conference: Dr. Faith Rogue Social Worker Present: Amada Jupiter, LCSW Nurse Present: Doran Durand, LPN Case Manager: Roderic Palau, RN PT Present: Aleda Grana, PT OT Present: Kearney Hard, OT SLP Present: Feliberto Gottron, SLP PPS Coordinator present : Edson Snowball, Park Breed, SLP     Current Status/Progress Goal Weekly Team Focus  Bowel/Bladder   continent of bowel & bladder, LBM 2/7  remain continent  assist as needed & monitor for changes   Swallow/Nutrition/ Hydration   Regular textures with thin liquids, Mod I  Mod I  Tolerance of ciet   ADL's   Min A overall  Supervision  self care retraining, R UE NMR, pt/family education, dc planning, functional transfers, sit<>stand   Mobility   CGA<>min assist transfers, min assist gait up to 50-60 ft with RW & hand orthosis, +2 assist for 6" steps x 4 with 1 rail, limited by BLE pain, decreased endurance  supervision overall with LRAD  R NMR, transfers, balance, gait, stair negotiation, endurance, pt education, d/c  planning   Communication             Safety/Cognition/ Behavioral Observations  Min A  Supervision  recall and problem solving   Pain   no c/o pain during the shift, has scheduled neurontin & voltaren gel, prn tylenol  pain scale <3/10  assess & treat as needed   Skin   has bruising to abdomen & right antecubital space  no new areas of skin break down  assess q shift    Rehab Goals Patient on target to meet rehab goals: Yes *See Care Plan and progress notes for long and short-term goals.     Barriers to Discharge  Current Status/Progress Possible Resolutions Date Resolved   Nursing                  PT  Lack of/limited family support  unsure if family can provide 24 hr supervision at d/c              OT                  SLP                SW                Discharge Planning/Teaching Needs:  Pt to return home with daughter and son-in-law.  Daughter working outside of the home and son-in-law caring for toddler.  Teaching needs TBD   Team Discussion: L frontal CVA, R hemi, pain R foot, has her  shoes anxious, added megace for appetite.  RN cont B/B, L shou pain, can DC IV.  OT min a overall, S goals.  PT min A, needs encouragement, S goals.  SLP language barrier, reg/thins, memory impaired, took care of 73 yo at home.  Dtr works.  SW to set up fam ed.   Revisions to Treatment Plan: N/A     Medical Summary Current Status: left frontal infarct with right hemiparesis, anxiety at times, decreased appetite, pain issues--right foot/shoulder Weekly Focus/Goal: see above, improve nutrition, pain rx, anxiety rx  Barriers to Discharge: Behavior;Medical stability       Continued Need for Acute Rehabilitation Level of Care: The patient requires daily medical management by a physician with specialized training in physical medicine and rehabilitation for the following reasons: Direction of a multidisciplinary physical rehabilitation program to maximize functional independence :  Yes Medical management of patient stability for increased activity during participation in an intensive rehabilitation regime.: Yes Analysis of laboratory values and/or radiology reports with any subsequent need for medication adjustment and/or medical intervention. : Yes   I attest that I was present, lead the team conference, and concur with the assessment and plan of the team.   Jodell Cipro M 11/24/2019, 5:07 PM   Team conference was held via web/ teleconference due to [place] - 19

## 2019-11-24 NOTE — Progress Notes (Signed)
Occupational Therapy Session Note  Patient Details  Name: Denise Leon MRN: [id number] Date of Birth: [birth year]  Today's Date: 11/24/2019 OT Individual Time: 1347-1500 OT Individual Time Calculation (min): 73 min   Short Term Goals: Week 2:  OT Short Term Goal 1 (Week 2): STGs= LTGs secondary to estimated short LOS  Skilled Therapeutic Interventions/Progress Updates:    Pt greeted semi-reclined in bed on the phone. Pt ended conversation after therapist entered. With encouragement, pt agreeable to shower today. Pt completed bed mobility with Hob elevated and min A. Pt needed OT assist to doff TED hose, then completed stand-pivot to wc with min A. Min A stand-pivot into shower onto tub seat. Worked on standing balance while doffing pants with CGA. Incorporated R UE NMR within bathing tasks with hand over hand A initially to integrate R UE. Pt was then able to follow through with shouder flex/ext and some grasp on wash cloth. 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 Pt able to thread pant legs with supervision, then min A to pull up over hips. Pt able to brush her hair today, then worked on hair drying with pt able to dry 50% of hair before reaching fatigue. Pt brushed teeth at the sink with verbal cues for use of R hand has a stabilizer. Pt completed stand-pivot back to bed at end of session with CGA. Pt left semi-reclined in bed with bed alarm on, call bell in reach, and needs met.  Therapy Documentation Precautions:  Precautions Precautions: Fall Precaution Comments: 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) Restrictions Weight Bearing Restrictions: No Pain: None/denies pain  Therapy/Group: Individual Therapy  Valma Cava 11/24/2019, 2:53 PM

## 2019-11-24 NOTE — Progress Notes (Signed)
Physical Therapy Session Note  Patient Details  Name: Denise Leon MRN: [id number] Date of Birth: Apr 02, 1946  Today's Date: 11/24/2019 PT Individual Time: 1104-1200 PT Individual Time Calculation (min): 56 min   Short Term Goals: Week 1:  PT Short Term Goal 1 (Week 1): Pt will ambulate 50 ft with LRAD & supervision. PT Short Term Goal 2 (Week 1): Pt will negotiate 4 steps with rails & CGA. PT Short Term Goal 3 (Week 1): Pt will complete bed<>w/c with CGA with LRAD.  Skilled Therapeutic Interventions/Progress Updates:  Attempted to use strata interpreter to see if communication in pt's native language help during session but language not listed on iPad.  Pt received in recliner & agreeable to tx. Therapist dons B ted hose & shoes total assist. Pt with edema in BLE (L>R) and pt c/o unrated pain in BLE (L>R) with pt reporting she used "pads or lidocaine patches" for pain relief at home - MD made aware, pain meds requested from nurse & rest breaks provided PRN. Therapist assisted pt with & instructed her on BLE heel cord stretches with use of sheet, 30-60 sec x 3 for each LE with pt demonstrating impaired memory as she did not recall performing these last week. Pt transfers recliner<>w/c via stand pivot without AD & CGA. Transported pt to gym via w/c dependent assist for time management. Pt negotiates 4 steps (6") with LUE support on 1 rail and heavy min assist to ascend with pt ascending leading with RLE 2/2 LLE pain. When descending, pt led with RLE & experienced significant LLE buckling and decreased BLE support requiring +2 assistance to descend the remaining stairs with 1 therapist providing upright support & 2nd therapist providing blocking at L knee as pt was limited by significant pain when descending stairs. Pt stood at high/low table x 7 minutes while engaging in peg board activity with pt correcting very few errors with only min cuing and task focusing on standing tolerance and BLE weight  bearing. Pt ambulates 50 ft with RW & R hand orthosis with min assist with improving step length & heel strike RLE but pt continues to be limited by pain. At end of session pt left in recliner with chair alarm donned & all needs at hand.  Therapy Documentation Precautions:  Precautions Precautions: Fall Precaution Comments: 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) Restrictions Weight Bearing Restrictions: No    Therapy/Group: Individual Therapy  Sandi Mariscal 11/24/2019, 12:30 PM

## 2019-11-25 ENCOUNTER — Inpatient Hospital Stay (HOSPITAL_COMMUNITY): Payer: Medicaid Other

## 2019-11-25 ENCOUNTER — Inpatient Hospital Stay (HOSPITAL_COMMUNITY): Payer: Medicaid Other | Admitting: Speech Pathology

## 2019-11-25 ENCOUNTER — Encounter (HOSPITAL_COMMUNITY): Payer: Medicaid Other | Admitting: Psychology

## 2019-11-25 ENCOUNTER — Inpatient Hospital Stay (HOSPITAL_COMMUNITY): Payer: Medicaid Other | Admitting: Physical Therapy

## 2019-11-25 NOTE — Progress Notes (Signed)
Speech Language Pathology Weekly Progress and Session Note  Patient Details  Name: Denise Leon MRN: [id number] Date of Birth: 01/10/1946  Beginning of progress report period: November 18, 2019 End of progress report period: November 25, 2019  Today's Date: 11/25/2019 SLP Individual Time: 1000-1055 SLP Individual Time Calculation (min): 55 min  Short Term Goals: Week 1: SLP Short Term Goal 1 (Week 1): Patient will consume regular textures with thin liquids with minimal overt s/s of aspiration with Mod I. SLP Short Term Goal 1 - Progress (Week 1): Met SLP Short Term Goal 2 (Week 1): Patient will recall new, daily information with Mod I for use of compensatory strategies. SLP Short Term Goal 2 - Progress (Week 1): Not met SLP Short Term Goal 3 (Week 1): Patient will demonstrate complex problem solving with functional tasks with Mod I. SLP Short Term Goal 3 - Progress (Week 1): Not met    New Short Term Goals: Week 2: SLP Short Term Goal 1 (Week 2): STGs=LTGs due to ELOS  Weekly Progress Updates: Patient demonstrates small and inconsistent gains and has met 1 of 3 STGs this reporting period. Currently, patient is consuming regular textures with thin liquids with minimal overt s/s of aspiration and overall Mod I for use of swallowing compensatory strategies. Patient also requires overall Min A verbal cues to complete functional and mildly complex tasks safely in regards to problem solving and recall with use of compensatory strategies. Patient and family education ongoing. Patient would benefit from continued skilled SLP intervention to maximize her cognitive functioning prior to discharge.      Intensity: Minumum of 1-2 x/day, 30 to 90 minutes Frequency: 3 to 5 out of 7 days Duration/Length of Stay: 12/04/19 Treatment/Interventions: Cognitive remediation/compensation;Internal/external aids;Therapeutic Activities;Environmental controls;Cueing hierarchy;Patient/family education;Functional  tasks   Daily Session  Skilled Therapeutic Interventions: Skilled treatment session focused on cognitive goals. SLP facilitated session by providing Max A verbal cues for problem solving and organization while transferring appointment information to a calendar to facilitate use of external aids. Patient appeared to demonstrate decreased comprehension throughout session, suspect due to a language barrier. Therefore, the video interpreter was utilized. Patient participated in a functional conversation that focused on anticipatory awareness and d/c planning in which patient required Min A verbal cues to identify tasks she can complete at home safely. Patient left upright in recliner with alarm on and all needs within reach. Continue with current plan of care.     Pain No/Denies Pain   Therapy/Group: Individual Therapy  [name] 11/25/2019, 7:00 AM

## 2019-11-25 NOTE — Progress Notes (Signed)
Social Work Patient ID: Denise Leon, female   DOB: 10/16/1945, 74 y.o.   MRN: [id number]  Have reviewed team conference with pt and daughter.  Both aware and agreeable with targeted d/c date of 2/19 and supervision goals.  Daughter, also, aware that we need to complete family ed and she will be getting back with me to set up a date.  Continue to follow.  Vila Dory, LCSW

## 2019-11-25 NOTE — Progress Notes (Signed)
[place] PHYSICAL MEDICINE & REHABILITATION PROGRESS NOTE   Subjective/Complaints: Denise Leon seen in the PT gym this morning sitting on bench. She has no complaints. Denies pain. Says she slept well last night. Moving her bowels regularly. Appears distracted and sometimes does not understands me. I asked what language she prefers and she says [place].   ROS: Patient denies fever, rash, sore throat, blurred vision, nausea, vomiting, diarrhea, cough, shortness of breath or chest pain,   headache, or mood change.    Objective:   No results found. No results for input(s): WBC, HGB, HCT, PLT in the last 72 hours. Recent Labs    11/23/19 0457  CREATININE 1.30*    Intake/Output Summary (Last 24 hours) at 11/25/2019 0925 Last data filed at 11/24/2019 1240 Gross per 24 hour  Intake 120 ml  Output --  Net 120 ml     Physical Exam: Vital Signs Blood pressure (!) 142/101, pulse 81, temperature 98.3 F (36.8 C), resp. rate 20, height 5\' 2"  (1.575 m), weight 63.4 kg, SpO2 99 %. Constitutional: No distress . Vital signs reviewed. Sitting in PT gym on bench.  HEENT: EOMI, oral membranes moist Neck: supple Cardiovascular: RRR without murmur. No JVD    Respiratory: CTA Bilaterally without wheezes or rales. Normal effort    GI: BS +, non-tender, non-distended  Extremities: No clubbing, cyanosis, or edema Skin: No evidence of breakdown, no evidence of rash  Musc: right medial arch pain with palpation and dorsiflexion.  Neurologic: AOx3. Cranial nerves II through XII intact/ Motor 5/5 LUE and LLE. RUE tr-1 pec/deltoid, trace biceps and 1/5 wrist and finger flexors---stable. RLE: 2 to 2+ to 3-/5 HF, KE and 4/5 ADF/PF. Senses pain and LT. No resting tone. Psych:pleasant but anxious Psych: Distracted, sometimes with difficulty understanding me.   Assessment/Plan: 1. 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  Physiatrist is providing close team supervision and 24 hour management of active medical problems listed below.  Physiatrist and rehab team continue to assess barriers to discharge/monitor patient progress toward functional and medical goals  Care Tool:  Bathing  Bathing activity did not occur: Refused Body parts bathed by patient: Chest, Abdomen, Front perineal area, Right upper leg, Left upper leg, Face   Body parts bathed by helper: Right arm, Left arm, Buttocks, Left lower leg, Right lower leg     Bathing assist Assist Level: Moderate Assistance - Patient 50 - 74%     Upper Body Dressing/Undressing Upper body dressing   What is the patient wearing?: Pull over shirt    Upper body assist Assist Level: Moderate Assistance - Patient 50 - 74%    Lower Body Dressing/Undressing Lower body dressing      What is the patient wearing?: Underwear/pull up, Pants     Lower body assist Assist for lower body dressing: Moderate Assistance - Patient 50 - 74%     Toileting Toileting    Toileting assist Assist for toileting: Moderate Assistance - Patient 50 - 74%     Transfers Chair/bed transfer  Transfers assist     Chair/bed transfer assist level: Contact Guard/Touching assist     Locomotion Ambulation   Ambulation assist      Assist level: Minimal Assistance - Patient > 75% Assistive device: Walker-rolling Max distance: 55 ft   Walk 10 feet activity   Assist  Walk 10 feet activity did not occur: Safety/medical concerns  Assist  level: Minimal Assistance - Patient > 75% Assistive device: Walker-rolling, Orthosis   Walk 50 feet activity   Assist Walk 50 feet with 2 turns activity did not occur: Safety/medical concerns  Assist level: Minimal Assistance - Patient > 75% Assistive device: Walker-rolling, Orthosis    Walk 150 feet activity   Assist Walk 150 feet activity did not occur: Safety/medical concerns          Walk 10 feet on uneven surface  activity   Assist Walk 10 feet on uneven surfaces activity did not occur: Safety/medical concerns         Wheelchair     Assist Will patient use wheelchair at discharge?: No Type of Wheelchair: Manual    Wheelchair assist level: Minimal Assistance - Patient > 75%, Supervision/Verbal cueing Max wheelchair distance: 100 ft    Wheelchair 50 feet with 2 turns activity    Assist        Assist Level: Minimal Assistance - Patient > 75%, Supervision/Verbal cueing   Wheelchair 150 feet activity     Assist          Blood pressure (!) 142/101, pulse 81, temperature 98.3 F (36.8 C), resp. rate 20, height 5\' 2"  (1.575 m), weight 63.4 kg, SpO2 99 %.  Medical Problem List and Plan: 1.Right side weakness with facial droopsecondary to patchy small infarct posterior left frontal region. Status post TPA. Plan 30 day event monitor. -patient may shower -ELOS/Goals: 10 to 14 days  -Continue CIR PT, OT, SLP-swallow and cognition.    2. Antithrombotics: -DVT/anticoagulation:Lovenox. Lower extremity Dopplers negative -antiplatelet therapy: Aspirin 325 mg daily and Plavix 75 mg daily 3. Pain Management:Neurontin 400 mg twice daily, Lidoderm patch  -Ice prn to right medial arch  2/4 -added voltaren for medial arch pain. ?plantar fasciitis   2/5-foot/arch needs to be stretched. Needs to ambulate with shoes from home.      2/6- got shoes- no improvement in RLE pain; just foot pain; will Add Duloxetine 30 mg QHS for nerve pain.   2/7-10 pain seems better. May perseverate on it a bit. 4. Mood:Melatonin 3 mg nightly -antipsychotic agents: N/A  2/10: sleeping well at night.  5. Neuropsych: This patientiscapable of making decisions on herown behalf. 6. Skin/Wound Care:Routine skin checks 7. Fluids/Electrolytes/Nutrition:encourage PO    -intake has decreased over last few  days  2/8-added megace short term (pt requested something for app)  2/9 observe for increased intake, ate 75% for breakfast   -asked patient to push her intake a little 8. Hypertension. Toprol-XL 25 mg daily. Monitor with increased mobility  2/9 reasonable control -- con't meds 9. CKD stage III. Follow-up chemistries-->Cr at 1.48 likely near baseline 10. Hyperlipidemia. Lipitor 11. Constipation:  Improving  - scheduled colace bid     2/7- had bms  2/10: having regular BM  LOS: 9 days A FACE TO FACE EVALUATION WAS PERFORMED  Denise Leon Denise Leon 11/25/2019, 9:25 AM

## 2019-11-25 NOTE — Progress Notes (Signed)
Occupational Therapy Session Note  Patient Details  Name: Denise Leon MRN: [id number] Date of Birth: 1946-04-25  Today's Date: 11/25/2019 OT Individual Time: 1300-1400 OT Individual Time Calculation (min): 60 min    Short Term Goals: Week 1:  OT Short Term Goal 1 (Week 1): STGs= LTGs secondary to estimated short LOS  Skilled Therapeutic Interventions/Progress Updates:    1:1. Pt received on toilet with NT. OT uses video interpreter throughout session. Pt completes sit to stand and stand pivot transfers with CGA-MIN A overall with VC for hand placement. Pt completes grasp/release, reaching, and pinch activities with blocks at table top. Foam block retrieved and cut up into cubes for pt to complete reaching on own time. Pt completes supine therex on mat: shoulder flex/ext, ab/adduct, elbow and wrsit flex/ext with min manual resistance in all motions for NMR and strengthening required for BADLs. Exiteds ession with pt seated in bed, exit alarm on and cal light inreach  Therapy Documentation Precautions:  Precautions Precautions: Fall Precaution Comments: 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) Restrictions Weight Bearing Restrictions: No General:   Vital Signs:  Pain:   ADL:   Vision   Perception    Praxis   Exercises:   Other Treatments:     Therapy/Group: Individual Therapy  Shon Hale 11/25/2019, 1:57 PM

## 2019-11-25 NOTE — Plan of Care (Signed)
  Problem: Consults Goal: RH STROKE PATIENT EDUCATION Description: See Patient Education module for education specifics  Outcome: Progressing   Problem: RH BOWEL ELIMINATION Goal: RH STG MANAGE BOWEL WITH ASSISTANCE Description: STG Manage Bowel with Assistance. Min Outcome: Progressing Goal: RH STG MANAGE BOWEL W/MEDICATION W/ASSISTANCE Description: STG Manage Bowel with Medication with Assistance. Min Outcome: Progressing   Problem: RH BLADDER ELIMINATION Goal: RH STG MANAGE BLADDER WITH ASSISTANCE Description: STG Manage Bladder With Assistance. Min Outcome: Progressing   Problem: RH SKIN INTEGRITY Goal: RH STG SKIN FREE OF INFECTION/BREAKDOWN Description: Skin will remain free of breakdown and infection while on rehab with min assist Outcome: Progressing Goal: RH STG MAINTAIN SKIN INTEGRITY WITH ASSISTANCE Description: STG Maintain Skin Integrity With Assistance. Min Outcome: Progressing   Problem: RH SAFETY Goal: RH STG ADHERE TO SAFETY PRECAUTIONS W/ASSISTANCE/DEVICE Description: STG Adhere to Safety Precautions With Assistance/Device. Mod I Outcome: Progressing   Problem: RH PAIN MANAGEMENT Goal: RH STG PAIN MANAGED AT OR BELOW PT'S PAIN GOAL Description: Less than 4 Outcome: Progressing   Problem: RH KNOWLEDGE DEFICIT Goal: RH STG INCREASE KNOWLEDGE OF DYSPHAGIA/FLUID INTAKE Description: Patient/caregiver will be able to verbalize safe swallowing for patient Outcome: Progressing Goal: RH STG INCREASE KNOWLEDGE OF STROKE PROPHYLAXIS Description: Patient/caregiver will be able to describe medicines to prevent stroke and how to take them, when to report to MD with cues/handouts Outcome: Progressing   

## 2019-11-25 NOTE — Progress Notes (Signed)
Physical Therapy Weekly Progress Note  Patient Details  Name: Denise Leon MRN: [id number] Date of Birth: May 16, 1946  Beginning of progress report period: November 17, 2019 End of progress report period: November 25, 2019  Today's Date: 11/25/2019 PT Individual Time: 0806-0919 PT Individual Time Calculation (min): 73 min   Patient has met 1 of 3 short term goals.  Pt is making slow progress towards LTG's as she continues to be limited by BLE pain with weight bearing. Pt continues to require encouragement to attempt tasks without assistance before therapist assists her. Pt currently requires min assist overall & is ambulating short distances with a RW. Pt demonstrates more functional movement in RLE compared to RUE. Pt would benefit from continued skilled PT treatment to focus on increasing independence with all mobility tasks and to focus on deficits noted below.   Patient continues to demonstrate the following deficits muscle weakness, decreased cardiorespiratoy endurance, decreased coordination, decreased awareness, decreased problem solving, decreased safety awareness, decreased memory and delayed processing, and decreased standing balance, decreased postural control, decreased balance strategies and R hemiparesis and therefore will continue to benefit from skilled PT intervention to increase functional independence with mobility.  Patient progressing toward long term goals..  Continue plan of care.  PT Short Term Goals Week 1:  PT Short Term Goal 1 (Week 1): Pt will ambulate 50 ft with LRAD & supervision. PT Short Term Goal 1 - Progress (Week 1): Not met PT Short Term Goal 2 (Week 1): Pt will negotiate 4 steps with rails & CGA. PT Short Term Goal 2 - Progress (Week 1): Not met PT Short Term Goal 3 (Week 1): Pt will complete bed<>w/c with CGA with LRAD. PT Short Term Goal 3 - Progress (Week 1): Met Week 2:  PT Short Term Goal 1 (Week 2): STG = LTG due to estimated d/c date.  Skilled  Therapeutic Interventions/Progress Updates:  Pt received in bed & agreeable to tx. Pt completes supine>sit with HOB slightly elevated, cuing for technique & encouragement to attempt task, extra time & supervision. Therapist assist pt with donning B ted hose for edema management & tennis shoes for BLE support. Pt completes stand pivot transfers throughout session without AD & CGA.    Lateral leans sitting EOM with focus on LUE use and RUE use as able, as well as modified sit ups on wedge to focus on core strengthening & uprighting trunk to carry over & increase ease with bed mobility.  Blocked practice of sit>supine, supine>R/L sidelying>sitting EOM with focus on compensatory technique and pushing to upright trunk with supervision and extra time.  Gait training x 45 ft + 55 ft with RW & CGA<>min assist with pt demonstrating decreased step length RLE, decreased stride length BLE, decreased weight shifting R with slight improvement on 2nd trial.   Seated kinetron 2 minutes x 2 with focus on BLE strengthening, R NMR & endurance training.  At end of session pt left in recliner with chair alarm donned, call bell & all needs in reach.  Therapy Documentation Precautions:  Precautions Precautions: Fall Precaution Comments: 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) Restrictions Weight Bearing Restrictions: No  Pain: Pt c/o "a little" unrated pain in BLE - rest breaks provided PRN. Pt c/o pain on R hand with use of hand orthosis - foam applied to alleviate feelings of discomfort.  Therapy/Group: Individual Therapy  Waunita Schooner 11/25/2019, 12:18 PM

## 2019-11-26 ENCOUNTER — Inpatient Hospital Stay (HOSPITAL_COMMUNITY): Payer: Medicaid Other

## 2019-11-26 ENCOUNTER — Inpatient Hospital Stay (HOSPITAL_COMMUNITY): Payer: Medicaid Other | Admitting: Occupational Therapy

## 2019-11-26 LAB — GLUCOSE, CAPILLARY: Glucose-Capillary: 91 mg/dL (ref 70–99)

## 2019-11-26 NOTE — Progress Notes (Signed)
[place] PHYSICAL MEDICINE & REHABILITATION PROGRESS NOTE   Subjective/Complaints: Complains of pain in her right foot still. Right shoulder tender too, but she says "it's not bad". Nods "yes" when I asked if pain was tingling and burning.   ROS: Patient denies fever, rash, sore throat, blurred vision, nausea, vomiting, diarrhea, cough, shortness of breath or chest pain, headache, or mood change.    Objective:   No results found. No results for input(s): WBC, HGB, HCT, PLT in the last 72 hours. No results for input(s): NA, K, CL, CO2, GLUCOSE, BUN, CREATININE, CALCIUM in the last 72 hours.  Intake/Output Summary (Last 24 hours) at 11/26/2019 0950 Last data filed at 11/26/2019 0827 Gross per 24 hour  Intake 556 ml  Output --  Net 556 ml     Physical Exam: Vital Signs Blood pressure 128/62, pulse 83, temperature 98.2 F (36.8 C), temperature source Oral, resp. rate 18, height 5\' 2"  (1.575 m), weight 63.4 kg, SpO2 98 %. Constitutional: No distress . Vital signs reviewed. HEENT: EOMI, oral membranes moist Neck: supple Cardiovascular: RRR without murmur. No JVD    Respiratory: CTA Bilaterally without wheezes or rales. Normal effort    GI: BS +, non-tender, non-distended  Extremities: No clubbing, cyanosis, or edema Skin: No evidence of breakdown, no evidence of rash  Musc: right shoulder tender with PROM. Right foot tender with ADF and palp Neurologic: AOx3. Cranial nerves II through XII intact/ Motor 5/5 LUE and LLE. RUE t pec/deltoid, trace biceps and 1 1+/5 wrist and finger flexors- . RLE:2+ to 3-/5 HF, KE and 4/5 ADF/PF. Senses pain and LT. No resting tone. Psych:pleasant but anxious Psych: a little anxious.   Assessment/Plan: 1. 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  Physiatrist is providing close team supervision and 24 hour management of active medical  problems listed below.  Physiatrist and rehab team continue to assess barriers to discharge/monitor patient progress toward functional and medical goals  Care Tool:  Bathing  Bathing activity did not occur: Refused Body parts bathed by patient: Chest, Abdomen, Front perineal area, Right upper leg, Left upper leg, Face   Body parts bathed by helper: Right arm, Left arm, Buttocks, Left lower leg, Right lower leg     Bathing assist Assist Level: Moderate Assistance - Patient 50 - 74%     Upper Body Dressing/Undressing Upper body dressing   What is the patient wearing?: Pull over shirt    Upper body assist Assist Level: Moderate Assistance - Patient 50 - 74%    Lower Body Dressing/Undressing Lower body dressing      What is the patient wearing?: Underwear/pull up, Pants     Lower body assist Assist for lower body dressing: Moderate Assistance - Patient 50 - 74%     Toileting Toileting    Toileting assist Assist for toileting: Moderate Assistance - Patient 50 - 74%     Transfers Chair/bed transfer  Transfers assist     Chair/bed transfer assist level: Contact Guard/Touching assist     Locomotion Ambulation   Ambulation assist      Assist level: Minimal Assistance - Patient > 75% Assistive device: Walker-rolling Max distance: 55 ft   Walk 10 feet activity   Assist  Walk 10 feet activity did not occur: Safety/medical concerns  Assist level: Minimal Assistance - Patient > 75% Assistive device: Walker-rolling, Orthosis   Walk 50 feet activity   Assist  Walk 50 feet with 2 turns activity did not occur: Safety/medical concerns  Assist level: Minimal Assistance - Patient > 75% Assistive device: Walker-rolling, Orthosis    Walk 150 feet activity   Assist Walk 150 feet activity did not occur: Safety/medical concerns         Walk 10 feet on uneven surface  activity   Assist Walk 10 feet on uneven surfaces activity did not occur: Safety/medical  concerns         Wheelchair     Assist Will patient use wheelchair at discharge?: No Type of Wheelchair: Manual    Wheelchair assist level: Minimal Assistance - Patient > 75%, Supervision/Verbal cueing Max wheelchair distance: 100 ft    Wheelchair 50 feet with 2 turns activity    Assist        Assist Level: Minimal Assistance - Patient > 75%, Supervision/Verbal cueing   Wheelchair 150 feet activity     Assist          Blood pressure 128/62, pulse 83, temperature 98.2 F (36.8 C), temperature source Oral, resp. rate 18, height 5\' 2"  (1.575 m), weight 63.4 kg, SpO2 98 %.  Medical Problem List and Plan: 1.Right side weakness with facial droopsecondary to patchy small infarct posterior left frontal region. Status post TPA. Plan 30 day event monitor. -patient may shower -ELOS 2/19  -Continue CIR PT, OT, SLP-     2. Antithrombotics: -DVT/anticoagulation:Lovenox. Lower extremity Dopplers negative -antiplatelet therapy: Aspirin 325 mg daily and Plavix 75 mg daily 3. Pain Management:Neurontin 400 mg twice daily, Lidoderm patch  -Ice prn to right medial arch  2/4 -added voltaren for medial arch pain. ?plantar fasciitis   2/5-foot/arch needs to be stretched. Needs to ambulate with shoes from home.      2/6- got shoes- no improvement in RLE pain; just foot pain; will Add Duloxetine 30 mg QHS for nerve pain.   2/11- continue patch, medications as above. Sometimes persevs 4. Mood:Melatonin 3 mg nightly -antipsychotic agents: N/A  2/11: sleeping well at night.  5. Neuropsych: This patientiscapable of making decisions on herown behalf. 6. Skin/Wound Care:Routine skin checks 7. Fluids/Electrolytes/Nutrition:encourage PO    -intake has decreased over last few days  2/8-added megace short term (pt requested something for app)  2/11 intake much better over last 24 hr 8. Hypertension. Toprol-XL 25 mg  daily. Monitor with increased mobility  2/9 reasonable control -- con't meds 9. CKD stage III. Follow-up chemistries-->Cr at 1.48 likely near baseline 10. Hyperlipidemia. Lipitor 11. Constipation:  Improving  - scheduled colace bid     2/7- had bms  2/11: having regular BMs  LOS: 10 days A FACE TO FACE EVALUATION WAS PERFORMED  4/11 11/26/2019, 9:50 AM

## 2019-11-26 NOTE — Progress Notes (Signed)
Occupational Therapy Session Note  Patient Details  Name: Denise Leon MRN: [id number] Date of Birth: 03-Jul-1946  Today's Date: 11/26/2019 OT Individual Time: 1696-7893 OT Individual Time Calculation (min): 14 min  and Today's Date: 11/26/2019 OT Missed Time: 60 Minutes Missed Time Reason: Patient unwilling/refused to participate without medical reason;Patient fatigue   Short Term Goals: Week 1:  OT Short Term Goal 1 (Week 1): STGs= LTGs secondary to estimated short LOS Week 2:  OT Short Term Goal 1 (Week 2): STGs= LTGs secondary to estimated short LOS  Skilled Therapeutic Interventions/Progress Updates:    Upon entering the room, pt supine in bed and sleeping soundly. OT awakened pt for OT participation and she declines OT intervention. Pt verbalizing, "No, I ' am tired" after each suggested activity. Pt also declining toileting. OT attempting to educate pt on importance of participation and encouragement but she continues to decline. Bed alarm activated and call bell within reach upon exiting the room.   Therapy Documentation Precautions:  Precautions Precautions: Fall Precaution Comments: 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) Restrictions Weight Bearing Restrictions: No General: General OT Amount of Missed Time: 60 Minutes Vital Signs:   Therapy/Group: Individual Therapy  Alen Bleacher 11/26/2019, 2:35 PM

## 2019-11-26 NOTE — Progress Notes (Signed)
Occupational Therapy Session Note  Patient Details  Name: Denise Leon MRN: [id number] Date of Birth: [birth year]  Today's Date: 11/26/2019 OT Individual Time: 0849-1000 OT Individual Time Calculation (min): 71 min   Short Term Goals: Week 2:  OT Short Term Goal 1 (Week 2): STGs= LTGs secondary to estimated short LOS  Skilled Therapeutic Interventions/Progress Updates:    Pt greeted sitting in recliner and agreeable to OT treatment session. Pt declined to shower, change clothes, or go to the bathroom. She was agreeable to brush teeth and hair at the sink. Worked on LB dressing with donning socks and shoes with pt still needed mod A. OT applied shoe button and educated on use to increaes independence when fastening shoes. Pt then ambulated 10 feet in room with RW and min A. OT reviewed use of R hand as a stabilizer and pt able to demonstrate understanding to stabilize toothrbush with R hand while applying toothpaste with L. Pt brought down to therapy gym in wc. Pt ambulated 15 feet in gym w/ RW and min A. R UE NMR with cup stacking activity with facilitation for normal movement patterns and to decrease shoulder hike when stacking cups. Used UE ranger with improved shoulder and elbow flex/ext. Pt ambulated 20 feet in gym with RW and CGA. Pt returned to room in wc and completed stand-pivot back to recliner with CGA. Pt left seated in recliner with chair alarm on, call bell in reach, and needs met.   Therapy Documentation Precautions:  Precautions Precautions: Fall Precaution Comments: 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) Restrictions Weight Bearing Restrictions: No Pain:   denies pain  Therapy/Group: Individual Therapy  Valma Cava 11/26/2019, 9:28 AM

## 2019-11-26 NOTE — Progress Notes (Signed)
Physical Therapy Session Note  Patient Details  Name: Denise Leon MRN: [id number] Date of Birth: 1946-07-05  Today's Date: 11/26/2019 PT Individual Time: 1100-1200  PT Individual Time Calculation (min): 60 min    Short Term Goals: Week 1:  PT Short Term Goal 1 (Week 1): Pt will ambulate 50 ft with LRAD & supervision. PT Short Term Goal 1 - Progress (Week 1): Not met PT Short Term Goal 2 (Week 1): Pt will negotiate 4 steps with rails & CGA. PT Short Term Goal 2 - Progress (Week 1): Not met PT Short Term Goal 3 (Week 1): Pt will complete bed<>w/c with CGA with LRAD. PT Short Term Goal 3 - Progress (Week 1): Met Week 2:  PT Short Term Goal 1 (Week 2): STG = LTG due to estimated d/c date. Week 3:     Skilled Therapeutic Interventions/Progress Updates:    PAIN denies pain   Pt initially OOB in recliner and agreeable to treatment session with focus on  NMRE RUE/RLE, balance, gait.  Pt STS from recliner w/min assist due to mild post tendency w/transition.  Gait 53f to wc w/RW and min assist due to post lob/recovery.  Turn sit to wc w/rw w/min assist and cues. RUE positioned on laptray for support.   Transported to gym for efficiency.  WC to mat w/RW and min assist.   Standing balance/NMRE task of standing at mat, reaching across  Body w/R hand to retrieve clothespin at waist level then placing on rack placed waist level to pt Lside requiring wt shifting to R. Repeated w/10 total clips and min assist for balance. Then using RUE, removed clothespins w/hand over hand assist for 25% of task, reaches across body to L to place pins back into bin.  Repeated x 10, min assist for balance.  Standing at mat w/beanbags place to pts L and R.  Pt retrieved beanbags and tossed to target alternating sides/using R/L hands for UE coordination and strength, balance, wt shifting, min assist for balance, asssist required to secure beanbag when using RUE.    Repeated STS 2 x10 reps for dynamic balance and LE  strengthening in functional setting.   Gait:   881fincluding single turn w/RW and cga, mild trendelenberg R, decreased step length, decreased stance time RLE.  Pt transported to room.  STS from wc and short distance gait 1028fncluding threshold of bathroom to commode, commode transfer w/cues and cga w/RW.  Requires min assist to manage lowering/raising brief and pants on R side.  conitinent of urine and performs hygiene w/supervision only.  Gait 36f23f bed as described above.  Stand to sit w/cga, scoots in sitting w/cues, sit to supine w/supervision using rail.  Pt left supine w/rails up x 3, alarm set, bed in lowest position, and needs in reach.   Good progress today w/increased gait distances and decreased physical assist required following NMRE acttivites.    Therapy Documentation Precautions:  Precautions Precautions: Fall Precaution Comments: 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) Restrictions Weight Bearing Restrictions: No    Therapy/Group: Individual Therapy  BarbCallie Fielding  Zenda1/2021, 12:31 PM

## 2019-11-27 ENCOUNTER — Inpatient Hospital Stay (HOSPITAL_COMMUNITY): Payer: Medicaid Other | Admitting: Speech Pathology

## 2019-11-27 ENCOUNTER — Inpatient Hospital Stay (HOSPITAL_COMMUNITY): Payer: Medicaid Other | Admitting: Physical Therapy

## 2019-11-27 ENCOUNTER — Inpatient Hospital Stay (HOSPITAL_COMMUNITY): Payer: Medicaid Other | Admitting: Occupational Therapy

## 2019-11-27 MED ORDER — MEGESTROL ACETATE 400 MG/10ML PO SUSP
400.0000 mg | Freq: Every day | ORAL | Status: DC
  Administered 2019-11-28: 400 mg via ORAL
  Filled 2019-11-27: qty 10

## 2019-11-27 NOTE — Progress Notes (Signed)
[place] PHYSICAL MEDICINE & REHABILITATION PROGRESS NOTE   Subjective/Complaints: No new changes. Ongoing pain in right shoulder, foot. Doesn't appear to be in much discomfort when I entered in the room.   ROS: Patient denies fever, rash, sore throat, blurred vision, nausea, vomiting, diarrhea, cough, shortness of breath or chest pain  headache, or mood change.    Objective:   No results found. No results for input(s): WBC, HGB, HCT, PLT in the last 72 hours. No results for input(s): NA, K, CL, CO2, GLUCOSE, BUN, CREATININE, CALCIUM in the last 72 hours.  Intake/Output Summary (Last 24 hours) at 11/27/2019 1012 Last data filed at 11/27/2019 0800 Gross per 24 hour  Intake 600 ml  Output --  Net 600 ml     Physical Exam: Vital Signs Blood pressure (!) 151/70, pulse 71, temperature 98.9 F (37.2 C), temperature source Oral, resp. rate 16, height 5\' 2"  (1.575 m), weight 63.4 kg, SpO2 100 %. Constitutional: No distress . Vital signs reviewed. HEENT: EOMI, oral membranes moist Neck: supple Cardiovascular: RRR without murmur. No JVD    Respiratory: CTA Bilaterally without wheezes or rales. Normal effort    GI: BS +, non-tender, non-distended  Extremities: No clubbing, cyanosis, or edema Skin: No evidence of breakdown, no evidence of rash  Musc: right shoulder pain with IR/ER, TTP right medial arch Neurologic: AOx3. Cranial nerves II through XII intact/ Motor 5/5 LUE and LLE. RUE t pec/deltoid, trace biceps and 1 1+/5 wrist and finger flexors- . RLE:2+ to 3-/5 HF, KE and 4/5 ADF/PF. Senses pain and LT. No resting tone. Psych: pleasant but anxious Psych: plesasnt.   Assessment/Plan: 1. 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  Physiatrist is providing close team supervision and 24 hour management of active medical problems listed below.  Physiatrist and rehab team  continue to assess barriers to discharge/monitor patient progress toward functional and medical goals  Care Tool:  Bathing  Bathing activity did not occur: Refused Body parts bathed by patient: Chest, Abdomen, Front perineal area, Right upper leg, Left upper leg, Face   Body parts bathed by helper: Right arm, Left arm, Buttocks, Left lower leg, Right lower leg     Bathing assist Assist Level: Moderate Assistance - Patient 50 - 74%     Upper Body Dressing/Undressing Upper body dressing   What is the patient wearing?: Pull over shirt    Upper body assist Assist Level: Moderate Assistance - Patient 50 - 74%    Lower Body Dressing/Undressing Lower body dressing      What is the patient wearing?: Underwear/pull up, Pants     Lower body assist Assist for lower body dressing: Moderate Assistance - Patient 50 - 74%     Toileting Toileting    Toileting assist Assist for toileting: Moderate Assistance - Patient 50 - 74%     Transfers Chair/bed transfer  Transfers assist     Chair/bed transfer assist level: Contact Guard/Touching assist     Locomotion Ambulation   Ambulation assist      Assist level: Minimal Assistance - Patient > 75%(cga following nmred) Assistive device: Walker-rolling Max distance: 80   Walk 10 feet activity   Assist  Walk 10 feet activity did not occur: Safety/medical concerns  Assist level: Minimal Assistance - Patient > 75% Assistive device: Walker-rolling   Walk 50 feet activity   Assist Walk 50 feet with 2 turns activity did not  occur: Safety/medical concerns  Assist level: Contact Guard/Touching assist(varies min at times) Assistive device: Walker-rolling, Orthosis    Walk 150 feet activity   Assist Walk 150 feet activity did not occur: Safety/medical concerns         Walk 10 feet on uneven surface  activity   Assist Walk 10 feet on uneven surfaces activity did not occur: Safety/medical concerns          Wheelchair     Assist Will patient use wheelchair at discharge?: No Type of Wheelchair: Manual    Wheelchair assist level: Minimal Assistance - Patient > 75%, Supervision/Verbal cueing Max wheelchair distance: 100 ft    Wheelchair 50 feet with 2 turns activity    Assist        Assist Level: Minimal Assistance - Patient > 75%, Supervision/Verbal cueing   Wheelchair 150 feet activity     Assist          Blood pressure (!) 151/70, pulse 71, temperature 98.9 F (37.2 C), temperature source Oral, resp. rate 16, height 5\' 2"  (1.575 m), weight 63.4 kg, SpO2 100 %.  Medical Problem List and Plan: 1.Right side weakness with facial droopsecondary to patchy small infarct posterior left frontal region. Status post TPA. Plan 30 day event monitor. -patient may shower -ELOS 2/19  -Continue CIR PT, OT, SLP-     2. Antithrombotics: -DVT/anticoagulation:Lovenox. Lower extremity Dopplers negative -antiplatelet therapy: Aspirin 325 mg daily and Plavix 75 mg daily 3. Pain Management:Neurontin 400 mg twice daily, Lidoderm patch  -Ice prn to right medial arch  2/12 continue above as well as cymbalta, votaren gel for foot/shoulder pain. ?neuropathic component. Inconsistent 4. Mood:Melatonin 3 mg nightly -antipsychotic agents: N/A  2/12: sleeping well at night.  5. Neuropsych: This patientiscapable of making decisions on herown behalf. 6. Skin/Wound Care:Routine skin checks 7. Fluids/Electrolytes/Nutrition:encourage PO    -intake has decreased over last few days  2/8-added megace short term (pt requested something for app)  2/12 intake much improved. Decrease megace to daily 8. Hypertension. Toprol-XL 25 mg daily. Monitor with increased mobility  2/12 reasonable control -- con't meds 9. CKD stage III.  ->Cr at 1.48 likely near baseline  2/12-recheck labs Monday  10. Hyperlipidemia. Lipitor 11.  Constipation:  Improving  - scheduled colace bid     2/7- had bms  2/11: having regular BMs  LOS: 11 days A FACE TO FACE EVALUATION WAS PERFORMED  4/11 11/27/2019, 10:12 AM

## 2019-11-27 NOTE — Plan of Care (Signed)
  Problem: Consults Goal: RH STROKE PATIENT EDUCATION Description: See Patient Education module for education specifics  Outcome: Progressing   Problem: RH BOWEL ELIMINATION Goal: RH STG MANAGE BOWEL WITH ASSISTANCE Description: STG Manage Bowel with Assistance. Min Outcome: Progressing Goal: RH STG MANAGE BOWEL W/MEDICATION W/ASSISTANCE Description: STG Manage Bowel with Medication with Assistance. Min Outcome: Progressing   Problem: RH BLADDER ELIMINATION Goal: RH STG MANAGE BLADDER WITH ASSISTANCE Description: STG Manage Bladder With Assistance. Min Outcome: Progressing   Problem: RH SKIN INTEGRITY Goal: RH STG SKIN FREE OF INFECTION/BREAKDOWN Description: Skin will remain free of breakdown and infection while on rehab with min assist Outcome: Progressing Goal: RH STG MAINTAIN SKIN INTEGRITY WITH ASSISTANCE Description: STG Maintain Skin Integrity With Assistance. Min Outcome: Progressing   Problem: RH SAFETY Goal: RH STG ADHERE TO SAFETY PRECAUTIONS W/ASSISTANCE/DEVICE Description: STG Adhere to Safety Precautions With Assistance/Device. Mod I Outcome: Progressing   Problem: RH PAIN MANAGEMENT Goal: RH STG PAIN MANAGED AT OR BELOW PT'S PAIN GOAL Description: Less than 4 Outcome: Progressing   Problem: RH KNOWLEDGE DEFICIT Goal: RH STG INCREASE KNOWLEDGE OF STROKE PROPHYLAXIS Description: Patient/caregiver will be able to describe medicines to prevent stroke and how to take them, when to report to MD with cues/handouts Outcome: Progressing

## 2019-11-27 NOTE — Progress Notes (Signed)
Speech Language Pathology Daily Session Note  Patient Details  Name: Denise Leon MRN: [id number] Date of Birth: 07/14/1946  Today's Date: 11/27/2019 SLP Individual Time: 0735-0830 SLP Individual Time Calculation (min): 55 min  Short Term Goals: Week 2: SLP Short Term Goal 1 (Week 2): STGs=LTGs due to ELOS  Skilled Therapeutic Interventions: Skilled treatment session focused on cognitive goals. SLP facilitated session by providing extra time and Mod verbal cues for sequencing and problem solving to sit EOB and for transfer to the recliner (cues to push up from bed and donn gripper socks). Patient required Mod verbal cues to utilize external aids to recall date and to anticipate upcoming therapy sessions. Patient also required Min verbal cues for problem solving with basic grooming tasks at the sink but was Mod I for tray set-up. Patient left upright in recliner with alarm on and all needs within reach. Continue with current plan of care.      Pain No/Denies Pain   Therapy/Group: Individual Therapy  [name] 11/27/2019, 3:29 PM

## 2019-11-27 NOTE — Progress Notes (Signed)
Physical Therapy Session Note  Patient Details  Name: Denise Leon MRN: [id number] Date of Birth: 16-Apr-1946  Today's Date: 11/27/2019 PT Individual Time: 1345-1454 PT Individual Time Calculation (min): 69 min   Short Term Goals: Week 2:  PT Short Term Goal 1 (Week 2): STG = LTG due to estimated d/c date.  Skilled Therapeutic Interventions/Progress Updates:  Pt received in bed reporting unrated HA but declining pain meds at this time. BP = 131/65 mmHg (LUE, supine), HR = 77 bpm. Pt initially declining participation reporting she'd like to rest with therapist providing max encouragement. Therapist then obtained [employer] interpreter & pt now denies c/o HA & agreeable to tx with encouragement. Pt transfers supine>sitting EOB with HOB slightly elevated, supervision, multiple attempts & significantly extra time. Therapist assisted pt with donning shoes sitting EOB & pt transfers sit>stand with close supervision/CGA. Pt ambulates in room/bathroom with RW & CGA with cuing for RUE management on hand orthosis. Pt requires assistance for clothing management on R side and completes toilet transfer with CGA & cuing to use grab bar. Pt with continent void on toilet then performs hand hygiene standing at sink with CGA for balance. Pt ambulates 100 ft + room<>dayroom with RW & CGA with cuing for increased step length RLE, upright posture vs R lateral lean, and to ambulate within base of AD. Pt demonstrates decreased weight shifting L during gait. In dayroom, pt engaged in reaching for horseshoes to R with RUE with focus on weight shifting to R for strengthening & NMR with pt grasping horseshoes with RUE then transitioning them to LUE to toss them. Back in room, pt doffed shoes with encouragement to attempt, and transfers sit>supine with supervision. Pt left in bed with alarm set & all needs at hand.  Therapy Documentation Precautions:  Precautions Precautions: Fall Precaution Comments: R hemi  (UE>LE) Restrictions Weight Bearing Restrictions: No     Therapy/Group: Individual Therapy  Sandi Mariscal 11/27/2019, 3:36 PM

## 2019-11-27 NOTE — Progress Notes (Signed)
Occupational Therapy Session Note  Patient Details  Name: Mozetta Murfin MRN: [id number] Date of Birth: 07/18/1946  Today's Date: 11/27/2019 OT Individual Time: 0947-1100 OT Individual Time Calculation (min): 73 min   Short Term Goals: Week 2:  OT Short Term Goal 1 (Week 2): STGs= LTGs secondary to estimated short LOS  Skilled Therapeutic Interventions/Progress Updates:    Pt greeted seated in recliner and agreeable to shower this morning. Pt amublated into bathroom with RW and CGA. Bathing completed with CGA and worked on R NMR within bathing tasks with improved R UE activation. Pt with improved recall of hemi dressing techniques requiring only min verbal cues. Pt with button up shirt today and had difficulty reaching behind with L UE to thread UE 2/2 L frozen shoulder and ROM deficits. Pt able to button a few buttons using one handed strategies. Pt brushed teeth and hair at the sink with min verbal cues for use of R hand as a stabilizer. Hair drying task completing with pt doing 75%. Pt needed OT assist to don socks, but was able to don shoes and tie them using shoe buttons with supervision.  Pt brought to dayroom in wc and worked on use of R hand as a stabilizer while writing and creating a Valentine's Day card. Pt returned to room and completed stand-pivot back to bed with supervision. Pt left semi-reclined in bed with bed alarm on, call bell in reach, and needs met.  Therapy Documentation Precautions:  Precautions Precautions: Fall Precaution Comments: 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) Restrictions Weight Bearing Restrictions: No Pain: Pain Assessment Pain Scale: 0-10 Pain Score: 0-No pain Faces Pain Scale: No hurt   Therapy/Group: Individual Therapy  Valma Cava 11/27/2019, 10:45 AM

## 2019-11-28 ENCOUNTER — Inpatient Hospital Stay (HOSPITAL_COMMUNITY): Payer: Medicare Other | Admitting: Speech Pathology

## 2019-11-28 ENCOUNTER — Inpatient Hospital Stay (HOSPITAL_COMMUNITY): Payer: Medicare Other | Admitting: Occupational Therapy

## 2019-11-28 DIAGNOSIS — I63512 Cerebral infarction due to unspecified occlusion or stenosis of left middle cerebral artery: Secondary | ICD-10-CM

## 2019-11-28 LAB — GLUCOSE, CAPILLARY: Glucose-Capillary: 130 mg/dL — ABNORMAL HIGH (ref 70–99)

## 2019-11-28 NOTE — Progress Notes (Signed)
[place] PHYSICAL MEDICINE & REHABILITATION PROGRESS NOTE   Subjective/Complaints: Patient in bed, no complaints today  ROS: Limited by aphasia  Objective:   No results found. No results for input(s): WBC, HGB, HCT, PLT in the last 72 hours. No results for input(s): NA, K, CL, CO2, GLUCOSE, BUN, CREATININE, CALCIUM in the last 72 hours.  Intake/Output Summary (Last 24 hours) at 11/28/2019 0911 Last data filed at 11/28/2019 0745 Gross per 24 hour  Intake 460 ml  Output 100 ml  Net 360 ml     Physical Exam: Vital Signs Blood pressure 138/76, pulse 67, temperature 98.4 F (36.9 C), temperature source Oral, resp. rate 18, height 5\' 2"  (1.575 m), weight 63.4 kg, SpO2 97 %. Constitutional: No distress . Vital signs reviewed. HEENT: EOMI, oral membranes moist Neck: supple Cardiovascular: RRR without murmur. No JVD    Respiratory: CTA Bilaterally without wheezes or rales. Normal effort    GI: BS +, non-tender, non-distended  Extremities: No clubbing, cyanosis, or edema Skin: No evidence of breakdown, no evidence of rash  MSK no pain with range of motion of the upper and lower limbs Neurologic: AOx3. Cranial nerves II through XII intact/ Motor 5/5 LUE and LLE. RUE t pec/deltoid, trace biceps and 1 1+/5 wrist and finger flexors- . RLE:2+ to 3-/5 HF, KE and 4/5 ADF/PF. Senses pain and LT. No resting tone. Psych: pleasant but anxious Psych: plesasnt.   Assessment/Plan: 1. 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  Physiatrist is providing close team supervision and 24 hour management of active medical problems listed below.  Physiatrist and rehab team continue to assess barriers to discharge/monitor patient progress toward functional and medical goals  Care Tool:  Bathing  Bathing activity did not occur: Refused Body parts bathed by patient: Chest, Abdomen, Front  perineal area, Right upper leg, Left upper leg, Face   Body parts bathed by helper: Right arm, Left arm, Buttocks, Left lower leg, Right lower leg     Bathing assist Assist Level: Moderate Assistance - Patient 50 - 74%     Upper Body Dressing/Undressing Upper body dressing   What is the patient wearing?: Pull over shirt    Upper body assist Assist Level: Moderate Assistance - Patient 50 - 74%    Lower Body Dressing/Undressing Lower body dressing      What is the patient wearing?: Underwear/pull up, Pants     Lower body assist Assist for lower body dressing: Moderate Assistance - Patient 50 - 74%     Toileting Toileting    Toileting assist Assist for toileting: Moderate Assistance - Patient 50 - 74%     Transfers Chair/bed transfer  Transfers assist     Chair/bed transfer assist level: Contact Guard/Touching assist     Locomotion Ambulation   Ambulation assist      Assist level: Contact Guard/Touching assist Assistive device: Walker-rolling Max distance: 150 ft   Walk 10 feet activity   Assist  Walk 10 feet activity did not occur: Safety/medical concerns  Assist level: Contact Guard/Touching assist Assistive device: Walker-rolling   Walk 50 feet activity   Assist Walk 50 feet with 2 turns activity did not occur: Safety/medical concerns  Assist level: Contact Guard/Touching assist Assistive device: Walker-rolling    Walk 150 feet activity   Assist Walk 150 feet activity did not occur: Safety/medical concerns  Assist level: Contact Guard/Touching assist Assistive device: Walker-rolling  Walk 10 feet on uneven surface  activity   Assist Walk 10 feet on uneven surfaces activity did not occur: Safety/medical concerns         Wheelchair     Assist Will patient use wheelchair at discharge?: No Type of Wheelchair: Manual    Wheelchair assist level: Minimal Assistance - Patient > 75%, Supervision/Verbal cueing Max wheelchair  distance: 100 ft    Wheelchair 50 feet with 2 turns activity    Assist        Assist Level: Minimal Assistance - Patient > 75%, Supervision/Verbal cueing   Wheelchair 150 feet activity     Assist          Blood pressure 138/76, pulse 67, temperature 98.4 F (36.9 C), temperature source Oral, resp. rate 18, height 5\' 2"  (1.575 m), weight 63.4 kg, SpO2 97 %.  Medical Problem List and Plan: 1.Right side weakness with facial droopsecondary to patchy small infarct posterior left frontal region. Status post TPA. Plan 30 day event monitor. -patient may shower -ELOS 2/19  -Continue CIR PT, OT, SLP-     2. Antithrombotics: -DVT/anticoagulation:Lovenox. Lower extremity Dopplers negative -antiplatelet therapy: Aspirin 325 mg daily and Plavix 75 mg daily 3. Pain Management:Neurontin 400 mg twice daily, Lidoderm patch  -Ice prn to right medial arch  2/12 continue above as well as cymbalta, votaren gel for foot/shoulder pain. ?neuropathic component. Inconsistent 4. Mood:Melatonin 3 mg nightly -antipsychotic agents: N/A  2/12: sleeping well at night.  5. Neuropsych: This patientiscapable of making decisions on herown behalf. 6. Skin/Wound Care:Routine skin checks 7. Fluids/Electrolytes/Nutrition:encourage PO    -intake has decreased over last few days  2/8-added megace short term (pt requested something for app)  2/13 intake much improved. DC megace 8. Hypertension. Toprol-XL 25 mg daily. Monitor with increased mobility   Vitals:   11/27/19 1914 11/28/19 0547  BP: 130/64 138/76  Pulse: 68 67  Resp: 18 18  Temp: 98.5 F (36.9 C) 98.4 F (36.9 C)  SpO2: 97% 97%  controlled 2/13 9. CKD stage III.  ->Cr at 1.48 likely near baseline  2/12-recheck labs Monday  10. Hyperlipidemia. Lipitor 11. Constipation:  Improving  - scheduled colace bid     2/7- had bms  2/11: having regular BMs  LOS: 12 days A  FACE TO FACE EVALUATION WAS PERFORMED  4/11 11/28/2019, 9:11 AM

## 2019-11-28 NOTE — Progress Notes (Signed)
Occupational Therapy Session Note  Patient Details  Name: Denise Leon MRN: [id number] Date of Birth: 10-16-1945  Today's Date: 11/28/2019 OT Individual Time: 1400-1426 OT Individual Time Calculation (min): 26 min   Short Term Goals: Week 1:  OT Short Term Goal 1 (Week 1): STGs= LTGs secondary to estimated short LOS  Skilled Therapeutic Interventions/Progress Updates:    Pt greeted in bed with no s/s pain. When OT presented her with a toothbrush, pt nodded and initiated supine<sit. CGA for sitting balance while she donned Lt sneaker. Assist needed to maintain figure 4 position with R LE so pt could don her sneaker and fasten shoe buttons on this side as well. Ambulatory transfer to sink completed using RW with Min A. OT facilitated HOH for bimanual incorporation of Rt during task. Pt also needed assist to keep her Rt hand placed on the sink for weightbearing in standing as it slid off after she positioned it herself. She then completed handwashing, active assist for reaching with R UE for the soap dispenser. Pt verbalized "pee pee" and gestured towards the restroom. Ambulatory transfer to toilet completed using RW with Min A. Min balance assist while she completed clothing mgt. Once pt voided bladder and completed perihygiene, she needed Max A to elevate brief and very large pants, assistance for tying pants also. Pt completed hand washing a second time at the sink, and then face washing. She returned to bed after and doffed her sneakers. While lying in bed, pt applied lotion to B UEs with HOH to incorporate the R UE. At end of session pt verbalized "thank you very much." Left her with all needs within reach and bed alarm set. Tx focus placed on standing balance during self care tasks, functional transfers, and Rt NMR.     Therapy Documentation Precautions:  Precautions Precautions: Fall Precaution Comments: 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) Restrictions Weight Bearing Restrictions: No Vital Signs: Therapy  Vitals Temp: 99.5 F (37.5 C) Temp Source: Oral Pulse Rate: 88 Resp: 17 BP: (!) 122/57 Patient Position (if appropriate): Lying Oxygen Therapy SpO2: 97 % O2 Device: Room Air      Therapy/Group: Individual Therapy  Denise Leon 11/28/2019, 3:46 PM

## 2019-11-28 NOTE — Progress Notes (Signed)
Speech Language Pathology Daily Session Note  Patient Details  Name: Denise Leon MRN: [id number] Date of Birth: 1946/01/21  Today's Date: 11/28/2019 SLP Individual Time: 1031-5945 SLP Individual Time Calculation (min): 30 min  Short Term Goals: Week 2: SLP Short Term Goal 1 (Week 2): STGs=LTGs due to ELOS  Skilled Therapeutic Interventions: Skilled treatment session targeted cognition goals. SLP facilitated session by having pt complete complex peg designs. Pt able to complete several sets with Mod I. Pt left in bed, bed alarm on and all needs within reach. Continue per current plan of care.      Pain    Therapy/Group: Individual Therapy  [name] 11/28/2019, 12:11 PM

## 2019-11-29 ENCOUNTER — Inpatient Hospital Stay (HOSPITAL_COMMUNITY): Payer: Medicare Other | Admitting: Occupational Therapy

## 2019-11-29 ENCOUNTER — Inpatient Hospital Stay (HOSPITAL_COMMUNITY): Payer: Medicare Other

## 2019-11-29 NOTE — Progress Notes (Signed)
Denise Leon PHYSICAL MEDICINE & REHABILITATION PROGRESS NOTE   Subjective/Complaints: No issues overnight.  Sparse verbal output but does follow commands  ROS: Limited by aphasia  Objective:   No results found. No results for input(s): WBC, HGB, HCT, PLT in the last 72 hours. No results for input(s): NA, K, CL, CO2, GLUCOSE, BUN, CREATININE, CALCIUM in the last 72 hours.  Intake/Output Summary (Last 24 hours) at 11/29/2019 0920 Last data filed at 11/29/2019 0844 Gross per 24 hour  Intake 860 ml  Output --  Net 860 ml     Physical Exam: Vital Signs Blood pressure (!) 153/74, pulse 67, temperature 98.6 F (37 C), resp. rate 18, height 5\' 2"  (1.575 m), weight 63.2 kg, SpO2 96 %. Constitutional: No distress . Vital signs reviewed. HEENT: EOMI, oral membranes moist Neck: supple Cardiovascular: RRR without murmur. No JVD    Respiratory: CTA Bilaterally without wheezes or rales. Normal effort    GI: BS +, non-tender, non-distended  Extremities: No clubbing, cyanosis, or edema Skin: No evidence of breakdown, no evidence of rash  MSK no pain with range of motion of the upper and lower limbs Neurologic: AOx3. Cranial nerves II through XII intact/ Motor 5/5 LUE and LLE. RUE t pec/deltoid, trace biceps and 1 1+/5 wrist and finger flexors- . RLE:2+ to 3-/5 HF, KE and 4/5 ADF/PF. Senses pain and LT. No resting tone. Psych: pleasant but anxious Psych: plesasnt.   Assessment/Plan: 1. 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  Physiatrist is providing close team supervision and 24 hour management of active medical problems listed below.  Physiatrist and rehab team continue to assess barriers to discharge/monitor patient progress toward functional and medical goals  Care Tool:  Bathing  Bathing activity did not occur: Refused Body parts bathed by patient: Chest, Abdomen,  Front perineal area, Right upper leg, Left upper leg, Face   Body parts bathed by helper: Right arm, Left arm, Buttocks, Left lower leg, Right lower leg     Bathing assist Assist Level: Moderate Assistance - Patient 50 - 74%     Upper Body Dressing/Undressing Upper body dressing   What is the patient wearing?: Pull over shirt    Upper body assist Assist Level: Moderate Assistance - Patient 50 - 74%    Lower Body Dressing/Undressing Lower body dressing      What is the patient wearing?: Underwear/pull up, Pants     Lower body assist Assist for lower body dressing: Moderate Assistance - Patient 50 - 74%     Toileting Toileting    Toileting assist Assist for toileting: Moderate Assistance - Patient 50 - 74%     Transfers Chair/bed transfer  Transfers assist     Chair/bed transfer assist level: Contact Guard/Touching assist     Locomotion Ambulation   Ambulation assist      Assist level: Contact Guard/Touching assist Assistive device: Walker-rolling Max distance: 150 ft   Walk 10 feet activity   Assist  Walk 10 feet activity did not occur: Safety/medical concerns  Assist level: Contact Guard/Touching assist Assistive device: Walker-rolling   Walk 50 feet activity   Assist Walk 50 feet with 2 turns activity did not occur: Safety/medical concerns  Assist level: Contact Guard/Touching assist Assistive device: Walker-rolling    Walk 150 feet activity   Assist Walk 150 feet activity did not occur: Safety/medical concerns  Assist level: Contact Guard/Touching assist Assistive device: Walker-rolling  Walk 10 feet on uneven surface  activity   Assist Walk 10 feet on uneven surfaces activity did not occur: Safety/medical concerns         Wheelchair     Assist Will patient use wheelchair at discharge?: No Type of Wheelchair: Manual    Wheelchair assist level: Minimal Assistance - Patient > 75%, Supervision/Verbal cueing Max  wheelchair distance: 100 ft    Wheelchair 50 feet with 2 turns activity    Assist        Assist Level: Minimal Assistance - Patient > 75%, Supervision/Verbal cueing   Wheelchair 150 feet activity     Assist          Blood pressure (!) 153/74, pulse 67, temperature 98.6 F (37 C), resp. rate 18, height 5\' 2"  (1.575 m), weight 63.2 kg, SpO2 96 %.  Medical Problem List and Plan: 1.Right side weakness with facial droopsecondary to patchy small infarct posterior left frontal region. Status post TPA. Plan 30 day event monitor. -patient may shower -ELOS 2/19  -Continue CIR PT, OT, SLP-     2. Antithrombotics: -DVT/anticoagulation:Lovenox. Lower extremity Dopplers negative -antiplatelet therapy: Aspirin 325 mg daily and Plavix 75 mg daily 3. Pain Management:Neurontin 400 mg twice daily, Lidoderm patch  -Ice prn to right medial arch  2/12 continue above as well as cymbalta, votaren gel for foot/shoulder pain. ?neuropathic component. Inconsistent 4. Mood:Melatonin 3 mg nightly -antipsychotic agents: N/A  2/12: sleeping well at night.  5. Neuropsych: This patientiscapable of making decisions on herown behalf. 6. Skin/Wound Care:Routine skin checks 7. Fluids/Electrolytes/Nutrition:encourage PO    -intake has decreased over last few days  2/8-added megace short term (pt requested something for app)  2/13 intake much improved. DC megace 8. Hypertension. Toprol-XL 25 mg daily. Monitor with increased mobility   Vitals:   11/28/19 1927 11/29/19 0416  BP: 128/68 (!) 153/74  Pulse: 70 67  Resp: 17 18  Temp: 98.4 F (36.9 C) 98.6 F (37 C)  SpO2: 95% 96%  controlled 4/43, mild systolic elevation 1/54 continue to monitor 9. CKD stage III.  ->Cr at 1.48 likely near baseline  2/12-recheck labs Monday  10. Hyperlipidemia. Lipitor 11. Constipation:  Improving  - scheduled colace bid     2/7- had bms  2/11:  having regular BMs  LOS: 13 days A FACE TO FACE EVALUATION WAS PERFORMED  Denise Leon 11/29/2019, 9:20 AM

## 2019-11-29 NOTE — Progress Notes (Signed)
Occupational Therapy Session Note  Patient Details  Name: Denise Leon MRN: [id number] Date of Birth: 1946-09-21  Today's Date: 11/29/2019 OT Individual Time: 1432-1530 OT Individual Time Calculation (min): 58 min   Short Term Goals: Week 2:  OT Short Term Goal 1 (Week 2): STGs= LTGs secondary to estimated short LOS     Skilled Therapeutic Interventions/Progress Updates:    Pt greeted in bed with no c/o pain. Agreeable to start session by using the restroom. Supine<sit completed unassisted where pt then donned both sneakers with supervision for balance and increased time for fastening shoe buttons. Ambulatory bathroom transfer completed using RW with steady assist. CGA for hygiene post bladder void and Min A for clothing mgt x2. While completing handwashing and oral care standing at the sink after, pt required Min A to functionally incorporate her affected UE. She refused to shower or sponge bathe, stated she felt clean. Therefore pt was escorted to the dayroom via w/c. Worked on R UE NMR via graded bean bag toss to target activity. Pt was unable to pick bean bags out of bin and therefore reached for each bag held by therapist. Verbal + tactile cues for minimizing shoulder hike and truncal compensatory strategies during functional reach and throw. Education focus was placed on dramatic grasp and release. Pt reported feeling concerned about the swelling in her Rt hand. Discussed elevation, gentle AROM, and self stretching using L UE to address. Pt able to demonstrate understanding of stretching techniques taught by OT and when asked if pt would do them outside of therapy she stated "I will." She was then escorted back to the room and completed another ambulatory transfer using RW with CGA, returning to bed. Left her in bed with all needs within reach and bed alarm set, R UE elevated on two pillows.   [employer] interpreting services used during tx.   Therapy Documentation Precautions:   Precautions Precautions: Fall Precaution Comments: 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) Restrictions Weight Bearing Restrictions: No Vital Signs: Therapy Vitals Temp: 98.5 F (36.9 C) Pulse Rate: 75 Resp: 17 BP: 137/65 Patient Position (if appropriate): Lying Oxygen Therapy SpO2: 98 % O2 Device: Room Air ADL:       Therapy/Group: Individual Therapy  Laelia Angelo A Tallulah Hosman 11/29/2019, 3:54 PM

## 2019-11-29 NOTE — Progress Notes (Signed)
Physical Therapy Session Note  Patient Details  Name: Denise Leon MRN: [id number] Date of Birth: [birth year]  Today's Date: 11/29/2019 PT Individual Time: 1015-1103 PT Individual Time Calculation (min): 48 min   Short Term Goals: Week 2:  PT Short Term Goal 1 (Week 2): STG = LTG due to estimated d/c date.  Skilled Therapeutic Interventions/Progress Updates:     Patient in bed upon PT arrival. Patient alert and agreeable to PT session. Patient denied pain during session. [employer] interpreter use throughout session for improved communication.   Therapeutic Activity: Bed Mobility: Patient performed supine to sit with min A for trunk support. Provided verbal cues for hand placement and pushing through UEs to sit up. She performed sit to supine with supervision in a flat bed without use of bed rails. Donned patient's shoes with max A for time management/energy conservation with patient sitting EOB. Transfers: Patient performed sit to/from stand x6 and stand pivot bed<>BSC, bed<>w/c and w/c<>mat table with CGA progressing to supervision after first trial. Provided verbal cues for foot placement and reaching back to sit. Required supervision for peri-care and mod-max A for LB dressing during toileting.   Wheelchair Mobility:  Patient propelled wheelchair 56 feet and 30 feet with supervision using B LEs for strengthening/endurance and functional mobility for short distances. Provided verbal cues for scooting forward in the chair to allow feet to touch the floor and reciprocal stepping.   Neuromuscular Re-ed: Patient performed the Berg Balance Scale: Patient demonstrates increased fall risk as noted by score of  30/56 on Berg Balance Scale.  (<36= high risk for falls, close to 100%; 37-45 significant >80%; 46-51 moderate >50%; 52-55 lower >25%). Educated patient on results and interpretation following. Patient denied having any questions.   Patient reported B LE numbness in her feet and anterior R  thigh in standing during testing. She reports that this is new and that it resolves in sitting. Sensation testing WFL in sitting during session.   Patient in bed due to fatigue at end of session with breaks locked, bed alarm set, and all needs within reach. Encouraged patient to sit up for lunch when it arrives for improved swallowing and sitting tolerance. Patient in agreement.    Therapy Documentation Precautions:  Precautions Precautions: Fall Precaution Comments: 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) Restrictions Weight Bearing Restrictions: No  Balance: Standardized Balance Assessment Standardized Balance Assessment: Berg Balance Test Berg Balance Test Sit to Stand: Able to stand  independently using hands Standing Unsupported: Able to stand 2 minutes with supervision Sitting with Back Unsupported but Feet Supported on Floor or Stool: Able to sit safely and securely 2 minutes Stand to Sit: Controls descent by using hands Transfers: Able to transfer with verbal cueing and /or supervision Standing Unsupported with Eyes Closed: Able to stand 10 seconds with supervision Standing Ubsupported with Feet Together: Needs help to attain position but able to stand for 30 seconds with feet together From Standing, Reach Forward with Outstretched Arm: Reaches forward but needs supervision From Standing Position, Pick up Object from Floor: Able to pick up shoe, needs supervision From Standing Position, Turn to Look Behind Over each Shoulder: Turn sideways only but maintains balance Turn 360 Degrees: Needs close supervision or verbal cueing Standing Unsupported, Alternately Place Feet on Step/Stool: Able to complete >2 steps/needs minimal assist Standing Unsupported, One Foot in Front: Able to take small step independently and hold 30 seconds Standing on One Leg: Tries to lift leg/unable to hold 3 seconds but remains standing independently Total Score: 30/56  Therapy/Group: Individual Therapy  Kerry Odonohue L  Byanka Landrus PT, DPT  11/29/2019, 12:46 PM

## 2019-11-29 NOTE — Plan of Care (Signed)
  Problem: Consults Goal: RH STROKE PATIENT EDUCATION Description: See Patient Education module for education specifics  Outcome: Progressing   Problem: RH BLADDER ELIMINATION Goal: RH STG MANAGE BLADDER WITH ASSISTANCE Description: STG Manage Bladder With Assistance. Min Outcome: Progressing

## 2019-11-30 ENCOUNTER — Inpatient Hospital Stay (HOSPITAL_COMMUNITY): Payer: Medicare Other | Admitting: Occupational Therapy

## 2019-11-30 ENCOUNTER — Inpatient Hospital Stay (HOSPITAL_COMMUNITY): Payer: Medicare Other | Admitting: Speech Pathology

## 2019-11-30 ENCOUNTER — Inpatient Hospital Stay (HOSPITAL_COMMUNITY): Payer: Medicare Other | Admitting: Physical Therapy

## 2019-11-30 LAB — BASIC METABOLIC PANEL
Anion gap: 11 (ref 5–15)
BUN: 34 mg/dL — ABNORMAL HIGH (ref 8–23)
CO2: 23 mmol/L (ref 22–32)
Calcium: 9 mg/dL (ref 8.9–10.3)
Chloride: 106 mmol/L (ref 98–111)
Creatinine, Ser: 1.63 mg/dL — ABNORMAL HIGH (ref 0.44–1.00)
GFR calc Af Amer: 36 mL/min — ABNORMAL LOW (ref >60)
GFR calc non Af Amer: 31 mL/min — ABNORMAL LOW (ref >60)
Glucose, Bld: 105 mg/dL — ABNORMAL HIGH (ref 70–99)
Potassium: 4.6 mmol/L (ref 3.5–5.1)
Sodium: 140 mmol/L (ref 135–145)

## 2019-11-30 LAB — CBC
HCT: 30.3 % — ABNORMAL LOW (ref 36.0–46.0)
Hemoglobin: 10 g/dL — ABNORMAL LOW (ref 12.0–15.0)
MCH: 31 pg (ref 26.0–34.0)
MCHC: 33 g/dL (ref 30.0–36.0)
MCV: 93.8 fL (ref 80.0–100.0)
Platelets: 546 10*3/uL — ABNORMAL HIGH (ref 150–400)
RBC: 3.23 MIL/uL — ABNORMAL LOW (ref 3.87–5.11)
RDW: 11.8 % (ref 11.5–15.5)
WBC: 11.1 10*3/uL — ABNORMAL HIGH (ref 4.0–10.5)
nRBC: 0 % (ref 0.0–0.2)

## 2019-11-30 MED ORDER — ENOXAPARIN SODIUM 30 MG/0.3ML ~~LOC~~ SOLN
30.0000 mg | SUBCUTANEOUS | Status: DC
  Administered 2019-11-30 – 2019-12-03 (×4): 30 mg via SUBCUTANEOUS
  Filled 2019-11-30 (×4): qty 0.3

## 2019-11-30 NOTE — Progress Notes (Signed)
Speech Language Pathology Daily Session Note  Patient Details  Name: Shiane Wenberg MRN: [id number] Date of Birth: 07-Feb-1946  Today's Date: 11/30/2019 SLP Individual Time: 0725-0820 SLP Individual Time Calculation (min): 55 min  Short Term Goals: Week 2: SLP Short Term Goal 1 (Week 2): STGs=LTGs due to ELOS  Skilled Therapeutic Interventions: Skilled treatment session focused on cognitive goals. Patient independently recalled 60% of her medications and their functions but required total A to recall the functions of her new medications since this admission. Patient also organized a BID pill box with supervision verbal cues for problem solving. Patient left upright in recliner with alarm on and all needs within reach. Continue with current plan of care.      Pain Pain Assessment Pain Scale: 0-10 Pain Score: 0-No pain  Therapy/Group: Individual Therapy  Merary Garguilo 11/30/2019, 12:29 PM

## 2019-11-30 NOTE — Progress Notes (Signed)
Physical Therapy Session Note  Patient Details  Name: Denise Leon MRN: [id number] Date of Birth: Jun 29, 1946  Today's Date: 11/30/2019 PT Individual Time: 1345-1458 PT Individual Time Calculation (min): 73 min   Short Term Goals: Week 2:  PT Short Term Goal 1 (Week 2): STG = LTG due to estimated d/c date.  Skilled Therapeutic Interventions/Progress Updates:  Pt received in bed & agreeable to tx. Utilized [employer] interpreter for improved communication during session.  R sidelying>sitting EOB with supervision, bed rails, and HOB slightly elevated with encouragement for pt to attempt movement on her own. Sit>R sidelying with supervision.  Therapist assisted pt with donning ted hose & pt dons shoes sitting EOB with supervision. At end of session pt doffs shoes with assistance to doff ted hose. Pt able to don non skid socks sitting EOB with supervision.  Sit<>stand during session with supervision, max cuing throughout for managing RUE on hand orthosis after standing & prior to sitting.  Gait in room/bathroom with RW & CGA with pt completing toilet transfer with CGA after continent void. Pt requires cuing to attend to RUE for hand hygiene standing at sink with supervision. Gait room>dayroom>gym>room with RW and R hand orthosis with CGA fade to supervision with frequent cuing to ambulate within base of AD with pt demonstrating improved weight shifting L & step length RLE on this date.  Kinetron in sitting progressing to standing with BUE>RUE>no UE support with min assist with cuing for upright posture, as well as 3" step taps without BUE support & min assist, with task focusing on weight shifting L<>R and RLE strengthening & NMR as well as dynamic balance. LLE single leg stance with min assist with task focusing on weight shifting L.   Pt left in bed with alarm set, call bell & all needs in reach.  Therapy Documentation Precautions:  Precautions Precautions: Fall Precaution Comments: R hemi  (UE>LE) Restrictions Weight Bearing Restrictions: No  Pain: Pt denies c/o pain, reports numbness in B feet that's the same as prior to admission.   Therapy/Group: Individual Therapy  Sandi Mariscal 11/30/2019, 3:01 PM

## 2019-11-30 NOTE — Progress Notes (Signed)
Occupational Therapy Weekly Progress Note  Patient Details  Name: Denise Leon MRN: [id number] Date of Birth: 08-31-1946  Beginning of progress report period: November 17, 2019 End of progress report period: November 30, 2019  Today's Date: 11/30/2019 OT Individual Time: 7494-4967 OT Individual Time Calculation (min): 73 min   Pt is making slow, but steady progress towards OT goals. Pt is at an overall CGA level for bathing/dressing tasks. Pt's R UE continues to improve with increase elbow.shoulder, wrist, and hand activation. We are working on integrated her R UE into functional tasks. She does well with isolated movement patterns, but has difficulty transferring these movements into functional tasks.   Skilled Therapeutic Interventions/Progress Updates:    Pt greeted seated in wc and agreeable to OT treatment session. Pt declined to shower this am 2/2 not having clean clothes. OT offered paper scrubs but she declined. Pt agreeable to wash clothes in laundry room. Pt completed stand-pivot to wc with close supervision. Verbal cues to unlock breaks, then OT propelled wc to laundry room. Worked on sit<>stand at top loader washer and placing clothing in washer with L hand only while R UE was weight bearing on washer. Pt brought to therapy gym and worked on [employer] with cup stacking activity with oT providing facilitation for normal movement pattern to decrease shoulder hike. Pt with improved grasp/release. Worked on isolated elbow flex/extension, then incorporated this movement back into cup stacking task with some improved motor learning. Moved on to isolated wrist flex/extension to try to "knock:" on upright board with increased difficulty w/ wrist extension requiring OT assist. Pt completed 5 mins x2 on [employer] arm bike with R UE secured on handle using Ace wrap. Pt returned to room at end of session and left seated in recliner with chair alarm on, call bell in reach, and needs met.   Therapy  Documentation Precautions:  Precautions Precautions: Fall Precaution Comments: 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) Restrictions Weight Bearing Restrictions: No Pain: Pain Assessment Pain Scale: 0-10 Pain Score: 0-No pain   Therapy/Group: Individual Therapy  Valma Cava 11/30/2019, 10:27 AM

## 2019-11-30 NOTE — Progress Notes (Signed)
[place] PHYSICAL MEDICINE & REHABILITATION PROGRESS NOTE   Subjective/Complaints: Had no problems last night. Says her pain is better. Eating well  ROS: Patient denies fever, rash, sore throat, blurred vision, nausea, vomiting, diarrhea, cough, shortness of breath or chest pain, joint or back pain, headache, or mood change.    Objective:   No results found. Recent Labs    11/30/19 0628  WBC 11.1*  HGB 10.0*  HCT 30.3*  PLT 546*   Recent Labs    11/30/19 0628  NA 140  K 4.6  CL 106  CO2 23  GLUCOSE 105*  BUN 34*  CREATININE 1.63*  CALCIUM 9.0    Intake/Output Summary (Last 24 hours) at 11/30/2019 0939 Last data filed at 11/29/2019 2300 Gross per 24 hour  Intake 360 ml  Output --  Net 360 ml     Physical Exam: Vital Signs Blood pressure 136/63, pulse 65, temperature 98.3 F (36.8 C), resp. rate 18, height 5\' 2"  (1.575 m), weight 63.2 kg, SpO2 98 %. Constitutional: No distress . Vital signs reviewed. HEENT: EOMI, oral membranes moist Neck: supple Cardiovascular: RRR without murmur. No JVD    Respiratory: CTA Bilaterally without wheezes or rales. Normal effort    GI: BS +, non-tender, non-distended  Extremities: No clubbing, cyanosis, or edema Skin: No evidence of breakdown, no evidence of rash  MSK no pain with range of motion of the upper and lower limbs Neurologic: AOx3. Cranial nerves II through XII intact/ Motor 5/5 LUE and LLE. RUE t pec/deltoid, trace biceps and 1 to 1+/5 wrist and finger flexors- . RLE:3-/5 HF, KE and 4/5 ADF/PF. Senses pain and LT. No resting tone. Psych: pleasant-no anxiety   Assessment/Plan: 1. 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  Physiatrist is providing close team supervision and 24 hour management of active medical problems listed below.  Physiatrist and rehab team continue to assess barriers to  discharge/monitor patient progress toward functional and medical goals  Care Tool:  Bathing  Bathing activity did not occur: Refused Body parts bathed by patient: Chest, Abdomen, Front perineal area, Right upper leg, Left upper leg, Face   Body parts bathed by helper: Right arm, Left arm, Buttocks, Left lower leg, Right lower leg     Bathing assist Assist Level: Moderate Assistance - Patient 50 - 74%     Upper Body Dressing/Undressing Upper body dressing   What is the patient wearing?: Pull over shirt    Upper body assist Assist Level: Moderate Assistance - Patient 50 - 74%    Lower Body Dressing/Undressing Lower body dressing      What is the patient wearing?: Underwear/pull up, Pants     Lower body assist Assist for lower body dressing: Moderate Assistance - Patient 50 - 74%     Toileting Toileting    Toileting assist Assist for toileting: Minimal Assistance - Patient > 75%     Transfers Chair/bed transfer  Transfers assist     Chair/bed transfer assist level: Supervision/Verbal cueing     Locomotion Ambulation   Ambulation assist      Assist level: Contact Guard/Touching assist Assistive device: Walker-rolling Max distance: 150 ft   Walk 10 feet activity   Assist  Walk 10 feet activity did not occur: Safety/medical concerns  Assist level: Contact Guard/Touching assist Assistive device: Walker-rolling   Walk 50 feet activity   Assist Walk 50 feet with 2 turns activity  did not occur: Safety/medical concerns  Assist level: Contact Guard/Touching assist Assistive device: Walker-rolling    Walk 150 feet activity   Assist Walk 150 feet activity did not occur: Safety/medical concerns  Assist level: Contact Guard/Touching assist Assistive device: Walker-rolling    Walk 10 feet on uneven surface  activity   Assist Walk 10 feet on uneven surfaces activity did not occur: Safety/medical concerns         Wheelchair     Assist  Will patient use wheelchair at discharge?: No Type of Wheelchair: Manual    Wheelchair assist level: Supervision/Verbal cueing Max wheelchair distance: 29'    Wheelchair 50 feet with 2 turns activity    Assist        Assist Level: Supervision/Verbal cueing   Wheelchair 150 feet activity     Assist          Blood pressure 136/63, pulse 65, temperature 98.3 F (36.8 C), resp. rate 18, height 5\' 2"  (1.575 m), weight 63.2 kg, SpO2 98 %.  Medical Problem List and Plan: 1.Right side weakness with facial droopsecondary to patchy small infarct posterior left frontal region. Status post TPA. Plan 30 day event monitor. -patient may shower -ELOS 2/19  -Continue CIR PT, OT, SLP-     2. Antithrombotics: -DVT/anticoagulation:Lovenox. Lower extremity Dopplers negative -antiplatelet therapy: Aspirin 325 mg daily and Plavix 75 mg daily 3. Pain Management:Neurontin 400 mg twice daily, Lidoderm patch  -Ice prn to right medial arch  2/15 continue above as well as cymbalta, votaren gel for foot/shoulder pain. ?neuropathic component. Appears improved 4. Mood:Melatonin 3 mg nightly -antipsychotic agents: N/A  2/12: sleeping well at night.  5. Neuropsych: This patientiscapable of making decisions on herown behalf. 6. Skin/Wound Care:Routine skin checks 7. Fluids/Electrolytes/Nutrition:   2/8-added megace short term (pt requested something for app)  2/13 intake much improved. DC'ed megace 8. Hypertension. Toprol-XL 25 mg daily. Monitor with increased mobility   Vitals:   11/29/19 1922 11/30/19 0445  BP: (!) 154/70 136/63  Pulse: 67 65  Resp: 18 18  Temp: 98.3 F (36.8 C) 98.3 F (36.8 C)  SpO2: 99% 98%  controlled 2/15  9. CKD stage III.  ->Cr at 1.48 likely near baseline  2/12-recheck labs Monday  10. Hyperlipidemia. Lipitor 11. Constipation:  Improving  - scheduled colace bid     bm 2/14  LOS: 14  days A FACE TO [place] 11/30/2019, 9:39 AM

## 2019-11-30 NOTE — Plan of Care (Signed)
Downgraded stair goal 2/2 slow progress, impaired strength & balance.  Problem: RH Stairs Goal: LTG Patient will ambulate up and down stairs w/assist (PT) Description: LTG: Patient will ambulate up and down # of stairs with assistance (PT) Flowsheets (Taken 11/30/2019 1455) LTG: Pt will ambulate up/down stairs assist needed:: (downgrade 2/2 slow progress, impaired strength & balance) Minimal Assistance - Patient > 75% LTG: Pt will  ambulate up and down number of stairs: 1 step with LRAD without rails for home access Note: downgrade 2/2 slow progress, impaired strength & balance

## 2019-12-01 ENCOUNTER — Inpatient Hospital Stay (HOSPITAL_COMMUNITY): Payer: Medicare Other | Admitting: Speech Pathology

## 2019-12-01 ENCOUNTER — Inpatient Hospital Stay (HOSPITAL_COMMUNITY): Payer: Medicare Other | Admitting: Occupational Therapy

## 2019-12-01 ENCOUNTER — Ambulatory Visit (HOSPITAL_COMMUNITY): Payer: Medicare Other | Admitting: Physical Therapy

## 2019-12-01 ENCOUNTER — Encounter (HOSPITAL_COMMUNITY): Payer: Medicare Other | Admitting: Occupational Therapy

## 2019-12-01 ENCOUNTER — Encounter (HOSPITAL_COMMUNITY): Payer: Medicare Other | Admitting: Speech Pathology

## 2019-12-01 NOTE — Patient Care Conference (Signed)
Inpatient RehabilitationTeam Conference and Plan of Care Update Date: 12/01/2019   Time: 10:30 AM    Patient Name: Denise Leon      Medical Record Number: [id number]  Date of Birth: 06-28-1946 Sex: Female         Room/Bed: 4W02C/4W02C-01 Payor Info: Payor: MEDICARE / Plan: MEDICARE PART A AND B / Product Type: *No Product type* /    Admit Date/Time:  11/16/2019  4:31 PM  Primary Diagnosis:  Acute ischemic stroke [facility])  Patient Active Problem List   Diagnosis Date Noted  . Palpitations 11/16/2019  . Essential hypertension 11/16/2019  . Hyperlipidemia 11/16/2019  . CKD (chronic kidney disease), stage IIIb 11/16/2019  . Thyroid nodule, L 11/16/2019  . 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 Stroke (HCC) 11/14/2019  . Acute ischemic stroke [facility]) patchy L frontal infarcts s/p tPA 11/14/2019    Expected Discharge Date: Expected Discharge Date: 12/04/19  Team Members Present: Physician leading conference: Dr. Faith Rogue Social Worker Present: Amada Jupiter, LCSW(Auria Lula Ols[place], [place]) Nurse Present: Doran Durand, LPN Case Manager: Roderic Palau, RN PT Present: Aleda Grana, PT OT Present: Kearney Hard, OT SLP Present: Feliberto Gottron, SLP PPS Coordinator present : Fae Pippin, SLP     Current Status/Progress Goal Weekly Team Focus  Bowel/Bladder   Pt is continent of b/b.  LBM 2/14  remain continent  assist as needed for toileting.   Swallow/Nutrition/ Hydration             ADL's   Supervision/CGA overall  Supervision  self-care retraining, R U RNMR< dc planning, pt/family education, transfers, balance   Mobility   CGA<>supervision gait with RW, up to +2 assist for stair negotiation 2/2 BLE pain, decreased endurance, supervision bed mobility, CGA<>supervision transfers, 30/56 Berg Balance Test  supervision overall with LRAD, except min assist stair negotiation with LRAD  NMR, transfers, balance, gait, stairs as able, endurance, pt/family education, d/c  planning   Communication             Safety/Cognition/ Behavioral Observations  Min A  Min A  Family Education   Pain   no c/o pain at this time.  pain scale <3/10  Assess pain Qshift/PRN, treat as needed   Skin   no obvious signs of skin breakdown/infection at this time.  no new areas of skin break down  Assess skin Qshift/PRN    Rehab Goals Patient on target to meet rehab goals: Yes *See Care Plan and progress notes for long and short-term goals.     Barriers to Discharge  Current Status/Progress Possible Resolutions Date Resolved   Nursing                  PT  Lack of/limited family support  unsure if family can provide assistance/supervision at d/c              OT                  SLP                SW                Discharge Planning/Teaching Needs:  Pt to return home with daughter and son-in-law.  Daughter working outside of the home and son-in-law caring for toddler.  Teaching with daughter to take place this afternoon.   Team Discussion: R hemi improving, pain better controlled, BP better, enc fluids.  RN cont b/B, L shou pain.  OT S/CGA overall,  Dtr in today for fam ed.  PT S walker, S bed, using interpreter, S goals.  SLP Reg/thins, memory impaired, L inattention.   Revisions to Treatment Plan: N/A     Medical Summary Current Status: improved right sided pain complaints. bp better as are cbg's. persistent right hp Weekly Focus/Goal: improve engagement of right side. pain mgt  Barriers to Discharge: Medical stability   Possible Resolutions to Barriers: see medical progress notes   Continued Need for Acute Rehabilitation Level of Care: The patient requires daily medical management by a physician with specialized training in physical medicine and rehabilitation for the following reasons: Direction of a multidisciplinary physical rehabilitation program to maximize functional independence : Yes Medical management of patient stability for increased activity  during participation in an intensive rehabilitation regime.: Yes Analysis of laboratory values and/or radiology reports with any subsequent need for medication adjustment and/or medical intervention. : Yes   I attest that I was present, lead the team conference, and concur with the assessment and plan of the team.   Retta Diones 12/02/2019, 11:51 AM   Team conference was held via web/ teleconference due to [place] - 19

## 2019-12-01 NOTE — Progress Notes (Signed)
Speech Language Pathology Daily Session Note  Patient Details  Name: Denise Leon MRN: [id number] Date of Birth: 1946-04-09  Today's Date: 12/01/2019  Session 1: SLP Individual Time: 8099-8338 SLP Individual Time Calculation (min): 25 min   Session 2: SLP Individual Time: 1430-1500 SLP Individual Time Calculation (min): 30 min  Short Term Goals: Week 2: SLP Short Term Goal 1 (Week 2): STGs=LTGs due to ELOS  Skilled Therapeutic Interventions:  Session 1: Skilled treatment session focused on cognitive goals. SLP facilitated session by providing overall Min A verbal cues for attention to her RUE while utilizing the walker during ambulation, especially prior to sitting. Patient was continent of bowel and bladder and was independent for self-care. However, supervision verbal cues were needed to attend to right field during clothing management. Patient also required Mod verbal cues to recall events from previous therapy session. Patient left upright in recliner with alarm on and all needs within reach. Continue with current plan of care.   Session 2: Skilled treatment session focused on completion of patient and family education. Patient's daughter was educated in regards to patient's current cognitive functioning and strategies to utilize at home to maximize problem solving, memory, safety and overall cognitive activity. She verbalized understanding of all information and a handout was given to reinforce information. Patient left upright in wheelchair with alarm on and all needs within reach. Continue with current plan of care.   Pain No/Denies Pain   Therapy/Group: Individual Therapy  Denise Leon 12/01/2019, 10:00 AM

## 2019-12-01 NOTE — Progress Notes (Addendum)
[facility] PHYSICAL MEDICINE & REHABILITATION PROGRESS NOTE   Subjective/Complaints: Feels well. Asked if she will need someone at home with her at discharge.   ROS: Patient denies fever, rash, sore throat, blurred vision, nausea, vomiting, diarrhea, cough, shortness of breath or chest pain, joint or back pain, headache, or mood change.   Objective:   No results found. Recent Labs    11/30/19 0628  WBC 11.1*  HGB 10.0*  HCT 30.3*  PLT 546*   Recent Labs    11/30/19 0628  NA 140  K 4.6  CL 106  CO2 23  GLUCOSE 105*  BUN 34*  CREATININE 1.63*  CALCIUM 9.0    Intake/Output Summary (Last 24 hours) at 12/01/2019 0926 Last data filed at 11/30/2019 1745 Gross per 24 hour  Intake 480 ml  Output --  Net 480 ml     Physical Exam: Vital Signs Blood pressure (!) 147/63, pulse 63, temperature 98.7 F (37.1 C), temperature source Oral, resp. rate 18, height 5\' 2"  (1.575 m), weight 63.2 kg, SpO2 96 %. Constitutional: No distress . Vital signs reviewed. HEENT: EOMI, oral membranes moist Neck: supple Cardiovascular: RRR without murmur. No JVD    Respiratory: CTA Bilaterally without wheezes or rales. Normal effort    GI: BS +, non-tender, non-distended  Extremities: No clubbing, cyanosis, or edema Skin: No evidence of breakdown, no evidence of rash  MSK: mild weakness/pain at right shoulder Neurologic: AOx3. Cranial nerves II through XII intact/ Motor 5/5 LUE and LLE. RUE 2- to 2/5pec/deltoid,   biceps and 2+ to 3-/5 wrist and finger flexors- . RLE:3-/5 HF, KE and 4/5 ADF/PF. Senses pain and LT. No resting tone.  Psych: pleasant   Assessment/Plan: 1. 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  Physiatrist is providing close team supervision and 24 hour management of active medical problems listed below.  Physiatrist and rehab team continue to assess barriers to  discharge/monitor patient progress toward functional and medical goals  Care Tool:  Bathing  Bathing activity did not occur: Refused Body parts bathed by patient: Chest, Abdomen, Front perineal area, Right upper leg, Left upper leg, Right arm, Left arm, Buttocks, Right lower leg, Left lower leg, Face   Body parts bathed by helper: Right arm, Left arm, Buttocks, Left lower leg, Right lower leg     Bathing assist Assist Level: Contact Guard/Touching assist     Upper Body Dressing/Undressing Upper body dressing   What is the patient wearing?: Pull over shirt    Upper body assist Assist Level: Contact Guard/Touching assist    Lower Body Dressing/Undressing Lower body dressing      What is the patient wearing?: Pants, Underwear/pull up     Lower body assist Assist for lower body dressing: Contact Guard/Touching assist     Toileting Toileting    Toileting assist Assist for toileting: Supervision/Verbal cueing     Transfers Chair/bed transfer  Transfers assist     Chair/bed transfer assist level: Supervision/Verbal cueing     Locomotion Ambulation   Ambulation assist      Assist level: Supervision/Verbal cueing Assistive device: Walker-rolling Max distance: 150 ft   Walk 10 feet activity   Assist  Walk 10 feet activity did not occur: Safety/medical concerns  Assist level: Supervision/Verbal cueing Assistive device: Walker-rolling, Orthosis   Walk 50 feet activity   Assist Walk 50 feet with 2 turns activity did not occur: Safety/medical concerns  Assist level: Supervision/Verbal cueing Assistive device: Walker-rolling, Orthosis    Walk 150 feet activity   Assist Walk 150 feet activity did not occur: Safety/medical concerns  Assist level: Supervision/Verbal cueing Assistive device: Walker-rolling, Orthosis    Walk 10 feet on uneven surface  activity   Assist Walk 10 feet on uneven surfaces activity did not occur: Safety/medical  concerns         Wheelchair     Assist Will patient use wheelchair at discharge?: No Type of Wheelchair: Manual    Wheelchair assist level: Supervision/Verbal cueing Max wheelchair distance: 38'    Wheelchair 50 feet with 2 turns activity    Assist        Assist Level: Supervision/Verbal cueing   Wheelchair 150 feet activity     Assist          Blood pressure (!) 147/63, pulse 63, temperature 98.7 F (37.1 C), temperature source Oral, resp. rate 18, height 5\' 2"  (1.575 m), weight 63.2 kg, SpO2 96 %.  Medical Problem List and Plan: 1.Right side weakness with facial droopsecondary to patchy small infarct posterior left frontal region. Status post TPA. Plan 30 day event monitor. -patient may shower -ELOS 2/19  -Continue CIR PT, OT, SLP-   Team conference today 2. Antithrombotics: -DVT/anticoagulation:Lovenox. Lower extremity Dopplers negative -antiplatelet therapy: Aspirin 325 mg daily and Plavix 75 mg daily 3. Pain Management:Neurontin 400 mg twice daily, Lidoderm patch  -Ice prn to right medial arch  - continue above as well as cymbalta, votaren gel for foot/shoulder pain. ?neuropathic component.   2/16 controlled 4. Mood:Melatonin 3 mg nightly -antipsychotic agents: N/A  2/12: sleeping well at night.  5. Neuropsych: This patientiscapable of making decisions on herown behalf. 6. Skin/Wound Care:Routine skin checks 7. Fluids/Electrolytes/Nutrition:   Eating well off megace 8. Hypertension. Toprol-XL 25 mg daily. Monitor with increased mobility   Vitals:   11/30/19 2018 12/01/19 0506  BP: (!) 134/55 (!) 147/63  Pulse: 73 63  Resp: 18   Temp: 98.4 F (36.9 C) 98.7 F (37.1 C)  SpO2: 99% 96%  controlled 2/15  9. CKD stage III.  ->Cr at 1.48 likely near baseline  2/15 C4 1.63   -pushing fluids   -recheck BMET 2/17 10. Hyperlipidemia. Lipitor 11. Constipation:  Improving  -  scheduled colace bid     bm 2/16   LOS: 15 days A FACE TO [facility] 12/01/2019, 9:26 AM

## 2019-12-01 NOTE — Progress Notes (Signed)
Physical Therapy Session Note  Patient Details  Name: Denise Leon MRN: [id number] Date of Birth: Mar 15, 1946  Today's Date: 12/01/2019 PT Individual Time: 5465-6812 PT Individual Time Calculation (min): 27 min   Short Term Goals: Week 2:  PT Short Term Goal 1 (Week 2): STG = LTG due to estimated d/c date.  Skilled Therapeutic Interventions/Progress Updates:  Pt received in w/c with daughter Shon Hale) present for caregiver training. Flor observed pt perform all functional mobility tasks with assistance from PT. Educated pt & daughter on need for close supervision for all mobility, need to remove throw rugs & ensure clear pathways in the home, and positioning on R side of pt when pt is up & moving. Pt completes sit<>stand transfers with RW & supervision with cuing for RUE management on hand orthosis, and stand pivot transfers without AD and close supervision. Pt completes ambulatory car transfer with RW & supervision with cuing to sit then place BLE in/out of car. Gait from ortho gym>main gym with RW & close supervision with cuing to ambulate within base of AD. Pt negotiates 4" step with RW & min assist with max cuing for compensatory pattern, sequencing, & management of RW. Back in room pt completes bed mobility with bed flat, no rails with supervision with assistance for UE placement when transitioning L sidelying>sitting EOB; pt requires extra time to complete bed mobility tasks. Educated pt's daughter on need for pt to attempt tasks on her own & as much as possible to progress with independence with all mobility. Reviewed HHPT f/u. Pt left in bed with daughter present, bed alarm set & call bell in reach, LCSW entering room.  Daughter interpreted for increased ease of communication PRN during session.  Therapy Documentation Precautions:  Precautions Precautions: Fall Precaution Comments: 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) Restrictions Weight Bearing Restrictions: No  Pain: No c/o pain during session.    Therapy/Group: Individual Therapy  Sandi Mariscal 12/01/2019, 3:36 PM

## 2019-12-01 NOTE — Progress Notes (Signed)
Occupational Therapy Session Note  Patient Details  Name: Denise Leon MRN: [id number] Date of Birth: October 12, 1946  Today's Date: 12/01/2019 OT Individual Time: 1137-1205 OT Individual Time Calculation (min): 28 min    Short Term Goals: Week 3:  OT Short Term Goal 1 (Week 3): STGs= LTGs secondary to estimated short LOS  Skilled Therapeutic Interventions/Progress Updates:    Pt seated in recliner at start of session.  She was able to donn her shoes with shoe buttons and supervision.  She then completed stand pivot transfer to the wheelchair with use of the RW with min assist.  Pt was then taken down to the dayroom where she worked on RUE AROM and functional use.  Began with her in standing at the high/low table and working on washing the table with the RUE.  She was able to complete with supervision.  Next, had her work on standing and picking up bean bags with the RUE from knee level and placing on the table, which was chest height.  Min assist at times in order to decreased shoulder hike.  Finished session with transition back to the room with transfer stand pivot back to the bed with min assist.  Finished session with call button and phone in reach.    Therapy Documentation Precautions:  Precautions Precautions: Fall Precaution Comments: 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) Restrictions Weight Bearing Restrictions: No  Pain: Pain Assessment Pain Scale: 0-10 Pain Score: 0-No pain Faces Pain Scale: No hurt ADL: See Care Tool Section for details of mobility and selfcare  Therapy/Group: Individual Therapy  Malina Geers OTR/L 12/01/2019, 12:56 PM

## 2019-12-01 NOTE — Plan of Care (Signed)
  Problem: RH Problem Solving Goal: LTG Patient will demonstrate problem solving for (SLP) Description: LTG:  Patient will demonstrate problem solving for basic/complex daily situations with cues  (SLP) Flowsheets (Taken 12/01/2019 [phone]) LTG Patient will demonstrate problem solving for: Minimal Assistance - Patient > 75% Note: Downgraded 2/16 due to slow progress   Problem: RH Memory Goal: LTG Patient will use memory compensatory aids to (SLP) Description: LTG:  Patient will use memory compensatory aids to recall biographical/new, daily complex information with cues (SLP) Flowsheets (Taken 12/01/2019 9562) LTG: Patient will use memory compensatory aids to (SLP): Minimal Assistance - Patient > 75% Note: Downgraded 2/16 due to slow progress

## 2019-12-01 NOTE — Progress Notes (Signed)
Occupational Therapy Session Note  Patient Details  Name: Denise Leon MRN: [id number] Date of Birth: 13-Jan-1946  Today's Date: 12/01/2019 Session 1 OT Individual Time: 8616-8372 OT Individual Time Calculation (min): 59 min   Session 2 OT Individual Time: 1400-1430 OT Individual Time Calculation (min): 30 min   Short Term Goals: Week 3:  OT Short Term Goal 1 (Week 3): STGs= LTGs secondary to estimated short LOS  Skilled Therapeutic Interventions/Progress Updates:    Session 1 Pt greeted seated in recliner and agreeable to OT treatment session focused on self-care retraining at shower level. Pt completed sit<>stand with RW and R hand splint with supervision. She ambulated into bathroom to toilet, then to shower w/ supervision overall with intermittent CGA when turning to sit. Pt voided bladder and had successful BM on commode. Pt was able to complete clothing management and toileting with close supervision. Bathing completed at shower level seated on bench with focus on forced use of R UE within bathing tasks. Pt was able to grasp wash cloth today and maintain grip for 50% of bathing tasks. Pt ambulated out of shower in similar fashion. Dressing completed sit<>stand from wc with improved initiation to integrate R UE into functional tasks today. Pt initiated reaching with R UE to help pull up pants on R side. Set-up A and increased time to don shirt, but good recall of hemi techniques. Pt completed hair drying task seated in wc at the sink. Min cues to use R hand to stabilize toothbrush while placing toothpaste to brush teeth. Worked on B UE coordination with laundry folding task. Pt completed stand-pivot back to recliner at end of session and left with chair alarm on, call bell in reach, and needs met   Session 2 Pt greeted semi-reclined in bed, daughter not present yet. Pt completed bed mobility with supervision and able to help push up into sitting using R hand. Pt donned shoes with set-up A,  then ambulated 10 feet w/ RW to wc with supervision. OT provided pt with RW bag and educated on uses. Worked on R hand fine motor control with picking up medium sized cubes and placing in cups. Daughter then entered room and pt brought down to tub room in wc. Educated daughter on safe ambulation in and out of bathroom and transferring on/off tub bench. Pt demonstrated understanding with supervision. Pt returned to room at end of session and handoff to SLP for next therapy session.   Therapy Documentation Precautions:  Precautions Precautions: Fall Precaution Comments: 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) Restrictions Weight Bearing Restrictions: No Pain: Pain Assessment Pain Scale: 0-10 Pain Score: 0-No pain Faces Pain Scale: No hurt   Therapy/Group: Individual Therapy  Valma Cava 12/01/2019, 3:25 PM

## 2019-12-02 ENCOUNTER — Inpatient Hospital Stay (HOSPITAL_COMMUNITY): Payer: Medicare Other | Admitting: Speech Pathology

## 2019-12-02 ENCOUNTER — Inpatient Hospital Stay (HOSPITAL_COMMUNITY): Payer: Medicare Other

## 2019-12-02 ENCOUNTER — Telehealth: Payer: Self-pay

## 2019-12-02 ENCOUNTER — Inpatient Hospital Stay (HOSPITAL_COMMUNITY): Payer: Medicare Other | Admitting: Physical Therapy

## 2019-12-02 LAB — BASIC METABOLIC PANEL
Anion gap: 8 (ref 5–15)
BUN: 30 mg/dL — ABNORMAL HIGH (ref 8–23)
CO2: 22 mmol/L (ref 22–32)
Calcium: 8.8 mg/dL — ABNORMAL LOW (ref 8.9–10.3)
Chloride: 109 mmol/L (ref 98–111)
Creatinine, Ser: 1.45 mg/dL — ABNORMAL HIGH (ref 0.44–1.00)
GFR calc Af Amer: 42 mL/min — ABNORMAL LOW (ref >60)
GFR calc non Af Amer: 36 mL/min — ABNORMAL LOW (ref >60)
Glucose, Bld: 95 mg/dL (ref 70–99)
Potassium: 4.5 mmol/L (ref 3.5–5.1)
Sodium: 139 mmol/L (ref 135–145)

## 2019-12-02 NOTE — Progress Notes (Signed)
Speech Language Pathology Daily Session Note  Patient Details  Name: Auda Finfrock MRN: [id number] Date of Birth: Jun 24, 1946  Today's Date: 12/02/2019 SLP Individual Time: 0825-0925 SLP Individual Time Calculation (min): 60 min  Short Term Goals: Week 2: SLP Short Term Goal 1 (Week 2): STGs=LTGs due to ELOS  Skilled Therapeutic Interventions: Skilled treatment session focused on cognitive goals. SLP facilitated session by providing extra time and overall supervision level verbal cues for safety with using the commode and performing basic self-care tasks at the sink. Patient participated in a complex PEG board task and was overall Mod I for selective attention to task for ~30 minutes, alternating attention between colors/numbers and problem solving throughout task. Patient also intermittently utilized her RUE to complete task. Patient left upright in recliner with alarm on and all needs within reach. Continue with current plan of care.      Pain No/Denies Pain   Therapy/Group: Individual Therapy  Russie Gulledge 12/02/2019, 12:18 PM

## 2019-12-02 NOTE — Progress Notes (Signed)
[facility] PHYSICAL MEDICINE & REHABILITATION PROGRESS NOTE   Subjective/Complaints: Denies pain. She says that her last BM is 3 days ago. SLP at bedside says she had 2 BM yesterday, one during their session. [name] says she forgot this and is very forgetful.   ROS: Patient denies fever, rash, sore throat, blurred vision, nausea, vomiting, diarrhea, cough, shortness of breath or chest pain, joint or back pain, headache, or mood change.   Objective:   No results found. Recent Labs    11/30/19 0628  WBC 11.1*  HGB 10.0*  HCT 30.3*  PLT 546*   Recent Labs    11/30/19 0628 12/02/19 0549  NA 140 139  K 4.6 4.5  CL 106 109  CO2 23 22  GLUCOSE 105* 95  BUN 34* 30*  CREATININE 1.63* 1.45*  CALCIUM 9.0 8.8*    Intake/Output Summary (Last 24 hours) at 12/02/2019 0908 Last data filed at 12/01/2019 1300 Gross per 24 hour  Intake 240 ml  Output --  Net 240 ml     Physical Exam: Vital Signs Blood pressure 123/83, pulse 86, temperature 98.6 F (37 C), resp. rate 15, height 5\' 2"  (1.575 m), weight 63.2 kg, SpO2 98 %. Constitutional: No distress . Vital signs reviewed. Lying in bed curled in side-lying position, appears comfortable.  HEENT: EOMI, oral membranes moist Neck: supple Cardiovascular: RRR without murmur. No JVD    Respiratory: CTA Bilaterally without wheezes or rales. Normal effort    GI: BS +, non-tender, non-distended  Extremities: No clubbing, cyanosis, or edema Skin: No evidence of breakdown, no evidence of rash  MSK: mild weakness/pain at right shoulder Neurologic: AOx3. Cranial nerves II through XII intact/ Motor 5/5 LUE and LLE. RUE 2- to 2/5pec/deltoid,   biceps and 2+ to 3-/5 wrist and finger flexors- . RLE:3-/5 HF, KE and 4/5 ADF/PF. Senses pain and LT. No resting tone.  Psych: pleasant   Assessment/Plan: 1. 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  Physiatrist is providing close team supervision and 24 hour management of active medical problems listed below.  Physiatrist and rehab team continue to assess barriers to discharge/monitor patient progress toward functional and medical goals  Care Tool:  Bathing  Bathing activity did not occur: Refused Body parts bathed by patient: Chest, Abdomen, Front perineal area, Right upper leg, Left upper leg, Right arm, Left arm, Buttocks, Right lower leg, Left lower leg, Face   Body parts bathed by helper: Right arm, Left arm, Buttocks, Left lower leg, Right lower leg     Bathing assist Assist Level: Contact Guard/Touching assist     Upper Body Dressing/Undressing Upper body dressing   What is the patient wearing?: Pull over shirt    Upper body assist Assist Level: Contact Guard/Touching assist    Lower Body Dressing/Undressing Lower body dressing      What is the patient wearing?: Pants, Underwear/pull up     Lower body assist Assist for lower body dressing: Contact Guard/Touching assist     Toileting Toileting    Toileting assist Assist for toileting: Supervision/Verbal cueing     Transfers Chair/bed transfer  Transfers assist     Chair/bed transfer assist level: Supervision/Verbal cueing     Locomotion Ambulation   Ambulation assist      Assist level: Supervision/Verbal cueing Assistive device: Walker-rolling Max distance: 100 ft   Walk 10 feet activity   Assist  Walk 10  feet activity did not occur: Safety/medical concerns  Assist level: Supervision/Verbal cueing Assistive device: Walker-rolling, Orthosis   Walk 50 feet activity   Assist Walk 50 feet with 2 turns activity did not occur: Safety/medical concerns  Assist level: Supervision/Verbal cueing Assistive device: Walker-rolling, Orthosis    Walk 150 feet activity   Assist Walk 150 feet activity did not occur: Safety/medical concerns  Assist level: Supervision/Verbal  cueing Assistive device: Walker-rolling, Orthosis    Walk 10 feet on uneven surface  activity   Assist Walk 10 feet on uneven surfaces activity did not occur: Safety/medical concerns         Wheelchair     Assist Will patient use wheelchair at discharge?: No Type of Wheelchair: Manual    Wheelchair assist level: Supervision/Verbal cueing Max wheelchair distance: 46'    Wheelchair 50 feet with 2 turns activity    Assist        Assist Level: Supervision/Verbal cueing   Wheelchair 150 feet activity     Assist          Blood pressure 123/83, pulse 86, temperature 98.6 F (37 C), resp. rate 15, height 5\' 2"  (1.575 m), weight 63.2 kg, SpO2 98 %.  Medical Problem List and Plan: 1.Right side weakness with facial droopsecondary to patchy small infarct posterior left frontal region. Status post TPA. Plan 30 day event monitor. -patient may shower -ELOS 2/19  -Continue CIR PT, OT, SLP 2. Antithrombotics: -DVT/anticoagulation:Lovenox. Lower extremity Dopplers negative -antiplatelet therapy: Aspirin 325 mg daily and Plavix 75 mg daily 3. Pain Management:Neurontin 400 mg twice daily, Lidoderm patch  -Ice prn to right medial arch  - continue above as well as cymbalta, votaren gel for foot/shoulder pain. ?neuropathic component.   2/17- pain is well controlled.  4. Mood:Melatonin 3 mg nightly -antipsychotic agents: N/A  2/12: sleeping well at night.  5. Neuropsych: This patientiscapable of making decisions on herown behalf. 6. Skin/Wound Care:Routine skin checks 7. Fluids/Electrolytes/Nutrition:   Eating well off megace 8. Hypertension. Toprol-XL 25 mg daily. Monitor with increased mobility   Vitals:   12/01/19 2050 12/02/19 0328  BP: (!) 158/84 123/83  Pulse: 80 86  Resp:    Temp: 98.2 F (36.8 C) 98.6 F (37 C)  SpO2: 92% 98%  controlled 2/15  2/17 BP is well controlled.  9. CKD  stage III.  ->Cr at 1.48 likely near baseline  2/15 C4 1.63   -pushing fluids   -recheck BMET 2/17  2/17: BMET personally reviewed, Cr improved to 1.45 10. Hyperlipidemia. Lipitor 11. Constipation:  Improving  - scheduled colace bid     2 bm on 2/16   LOS: 16 days A FACE TO FACE EVALUATION WAS PERFORMED  Denise Leon Denise Leon 12/02/2019, 9:08 AM

## 2019-12-02 NOTE — Discharge Summary (Signed)
Physician Discharge Summary  Patient ID: Denise Leon MRN: [id number] DOB/AGE: May 12, 1946 74 y.o.  Admit date: 11/16/2019 Discharge date: 12/04/2019  Discharge Diagnoses:  Principal Problem:   Acute ischemic stroke [facility](HCC) patchy L frontal infarcts s/p tPA Active Problems:   Left middle cerebral artery stroke [facility](HCC) DVT prophylaxis Hypertension Mood stabilization CKD stage III Hyperlipidemia  Discharged Condition: Stable  Significant Diagnostic Studies: CT Angio Head W or Wo Contrast  Result Date: 11/14/2019 CLINICAL DATA:  Initial evaluation for acute right upper extremity weakness. EXAM: CT ANGIOGRAPHY HEAD AND NECK TECHNIQUE: Multidetector CT imaging of the head and neck was performed using the standard protocol during bolus administration of intravenous contrast. Multiplanar CT image reconstructions and MIPs were obtained to evaluate the vascular anatomy. Carotid stenosis measurements (when applicable) are obtained utilizing NASCET criteria, using the distal internal carotid diameter as the denominator. CONTRAST:  50mL OMNIPAQUE IOHEXOL 350 MG/ML SOLN COMPARISON:  Prior head CT from earlier the same day. FINDINGS: CTA NECK FINDINGS Aortic arch: Visualized aortic arch of normal caliber with normal 3 vessel morphology. Scattered calcified and noncalcified plaque present within the visualized arch and descending intrathoracic aorta. Scattered areas of multifocal soft plaque and/or thrombus protruding into the aortic lumen (series 7, image 20). No hemodynamically significant stenosis seen about the origin of the great vessels. Visualized subclavian arteries widely patent. Right carotid system: Right CCA widely patent from its origin to the bifurcation without stenosis. Minimal atheromatous plaque about the right bifurcation without stenosis. Right ICA widely patent distally without stenosis, dissection or occlusion. Left carotid system: Left CCA widely patent from its origin to the bifurcation  without stenosis. Mixed eccentric plaque seen at the origin of the left ICA with associated mild stenosis of no more than 30% by NASCET criteria. Left ICA patent distally to the skull base without stenosis, dissection, or occlusion. Vertebral arteries: Both vertebral arteries arise from the subclavian arteries. Vertebral arteries widely patent within the neck without stenosis, dissection or occlusion. Skeleton: No acute osseous abnormality. No discrete or worrisome osseous lesions. Patient is edentulous. Advanced cervical spondylosis noted at C5-6 and C6-7. Other neck: No other acute soft tissue abnormality within the neck. No adenopathy. Enlarged left lobe of thyroid with a few scattered nodules present, largest of which measures 19 mm (series 5, image 29). Right lobe of thyroid absent. Upper chest: Visualized upper chest demonstrates no acute abnormality. Review of the MIP images confirms the above findings CTA HEAD FINDINGS Anterior circulation: Petrous segments widely patent bilaterally. Mild scattered atheromatous plaque within the carotid siphons without hemodynamically significant stenosis. A1 segments widely patent. Normal anterior communicating artery. Anterior cerebral arteries patent to their distal aspects without stenosis. No M1 stenosis or occlusion. Negative MCA bifurcations. Distal MCA branches well perfused and symmetric. Posterior circulation: Right vertebral artery widely patent to the vertebrobasilar junction. Focal plaque within the mid left V4 segment with short-segment mild stenosis. Both posterior inferior cerebral arteries patent bilaterally. Basilar widely patent to its distal aspect without stenosis. Superior cerebral arteries patent bilaterally. Both PCAs well perfused to their distal aspects without stenosis. Small left posterior communicating artery noted. Venous sinuses: Patent. Anatomic variants: None significant. Review of the MIP images confirms the above findings IMPRESSION: 1.  Negative CTA for large vessel occlusion. 2. Mixed atheromatous plaque at the origin of the left ICA with short-segment mild 30% stenosis. 3. Moderate atherosclerotic change about the aortic arch, with more mild atherosclerotic change about the carotid siphons. No other hemodynamically significant or correctable stenosis identified. 4. 19  mm left thyroid nodule, indeterminate. Further evaluation with dedicated thyroid ultrasound recommended. This could be performed on a nonemergent outpatient basis. Electronically Signed   By: Rise Mu M.D.   On: 11/14/2019 01:03   CT ANGIO NECK W OR WO CONTRAST  Result Date: 11/14/2019 CLINICAL DATA:  Initial evaluation for acute right upper extremity weakness. EXAM: CT ANGIOGRAPHY HEAD AND NECK TECHNIQUE: Multidetector CT imaging of the head and neck was performed using the standard protocol during bolus administration of intravenous contrast. Multiplanar CT image reconstructions and MIPs were obtained to evaluate the vascular anatomy. Carotid stenosis measurements (when applicable) are obtained utilizing NASCET criteria, using the distal internal carotid diameter as the denominator. CONTRAST:  36mL OMNIPAQUE IOHEXOL 350 MG/ML SOLN COMPARISON:  Prior head CT from earlier the same day. FINDINGS: CTA NECK FINDINGS Aortic arch: Visualized aortic arch of normal caliber with normal 3 vessel morphology. Scattered calcified and noncalcified plaque present within the visualized arch and descending intrathoracic aorta. Scattered areas of multifocal soft plaque and/or thrombus protruding into the aortic lumen (series 7, image 20). No hemodynamically significant stenosis seen about the origin of the great vessels. Visualized subclavian arteries widely patent. Right carotid system: Right CCA widely patent from its origin to the bifurcation without stenosis. Minimal atheromatous plaque about the right bifurcation without stenosis. Right ICA widely patent distally without  stenosis, dissection or occlusion. Left carotid system: Left CCA widely patent from its origin to the bifurcation without stenosis. Mixed eccentric plaque seen at the origin of the left ICA with associated mild stenosis of no more than 30% by NASCET criteria. Left ICA patent distally to the skull base without stenosis, dissection, or occlusion. Vertebral arteries: Both vertebral arteries arise from the subclavian arteries. Vertebral arteries widely patent within the neck without stenosis, dissection or occlusion. Skeleton: No acute osseous abnormality. No discrete or worrisome osseous lesions. Patient is edentulous. Advanced cervical spondylosis noted at C5-6 and C6-7. Other neck: No other acute soft tissue abnormality within the neck. No adenopathy. Enlarged left lobe of thyroid with a few scattered nodules present, largest of which measures 19 mm (series 5, image 29). Right lobe of thyroid absent. Upper chest: Visualized upper chest demonstrates no acute abnormality. Review of the MIP images confirms the above findings CTA HEAD FINDINGS Anterior circulation: Petrous segments widely patent bilaterally. Mild scattered atheromatous plaque within the carotid siphons without hemodynamically significant stenosis. A1 segments widely patent. Normal anterior communicating artery. Anterior cerebral arteries patent to their distal aspects without stenosis. No M1 stenosis or occlusion. Negative MCA bifurcations. Distal MCA branches well perfused and symmetric. Posterior circulation: Right vertebral artery widely patent to the vertebrobasilar junction. Focal plaque within the mid left V4 segment with short-segment mild stenosis. Both posterior inferior cerebral arteries patent bilaterally. Basilar widely patent to its distal aspect without stenosis. Superior cerebral arteries patent bilaterally. Both PCAs well perfused to their distal aspects without stenosis. Small left posterior communicating artery noted. Venous sinuses:  Patent. Anatomic variants: None significant. Review of the MIP images confirms the above findings IMPRESSION: 1. Negative CTA for large vessel occlusion. 2. Mixed atheromatous plaque at the origin of the left ICA with short-segment mild 30% stenosis. 3. Moderate atherosclerotic change about the aortic arch, with more mild atherosclerotic change about the carotid siphons. No other hemodynamically significant or correctable stenosis identified. 4. 19 mm left thyroid nodule, indeterminate. Further evaluation with dedicated thyroid ultrasound recommended. This could be performed on a nonemergent outpatient basis. Electronically Signed   By: Rise Mu  M.D.   On: 11/14/2019 01:03   CT Angio Chest PE W and/or Wo Contrast  Result Date: 11/06/2019 CLINICAL DATA:  Shortness of breath. EXAM: CT ANGIOGRAPHY CHEST WITH CONTRAST TECHNIQUE: Multidetector CT imaging of the chest was performed using the standard protocol during bolus administration of intravenous contrast. Multiplanar CT image reconstructions and MIPs were obtained to evaluate the vascular anatomy. CONTRAST:  21mL OMNIPAQUE IOHEXOL 350 MG/ML SOLN COMPARISON:  None. FINDINGS: Cardiovascular: There is moderate severity calcification atherosclerosis throughout the thoracic aorta. Satisfactory opacification of the pulmonary arteries to the segmental level. No evidence of pulmonary embolism. Normal heart size. No pericardial effusion. Mediastinum/Nodes: No enlarged mediastinal, hilar, or axillary lymph nodes. The left lobe of the thyroid gland is lobulated and heterogeneous in appearance. Lungs/Pleura: Lungs are clear. No pleural effusion or pneumothorax. Upper Abdomen: A 1.4 cm simple cyst is seen within the posteromedial aspect of the upper pole of the right kidney. A 4.1 cm cyst is seen within the upper pole of the left kidney. Musculoskeletal: Multilevel degenerative changes seen throughout the thoracic spine. Noninflamed diverticula are seen within  the large bowel. Review of the MIP images confirms the above findings. IMPRESSION: 1. No evidence of pulmonary embolism or acute cardiopulmonary disease. 2. Bilateral simple renal cysts. 3. Colonic diverticulosis. Electronically Signed   By: Aram Candela M.D.   On: 11/06/2019 19:57   MR BRAIN WO CONTRAST  Result Date: 11/15/2019 CLINICAL DATA:  Initial evaluation for acute stroke, right upper extremity weakness. EXAM: MRI HEAD WITHOUT CONTRAST TECHNIQUE: Multiplanar, multiecho pulse sequences of the brain and surrounding structures were obtained without intravenous contrast. COMPARISON:  Prior CT and CTA from 11/14/2019. FINDINGS: Brain: Mildly advanced age-related cerebral atrophy. Patchy T2/FLAIR hyperintensity within the periventricular deep white matter both cerebral hemispheres most consistent with chronic microvascular ischemic disease, moderate to advanced in nature. Few scattered superimposed remote lacunar infarcts present within the hemispheric cerebral white matter. Patchy small volume foci of restricted diffusion seen involving the posterior left frontal region, with involvement of the pre and postcentral gyri (series 5, image 86). No associated hemorrhage or mass effect. No other evidence for acute or subacute ischemia. Gray-white matter differentiation otherwise maintained. No other areas of remote cortical infarction. No foci of susceptibility artifact to suggest acute or chronic intracranial hemorrhage. No mass lesion, midline shift or mass effect. No hydrocephalus. No extra-axial fluid collection. 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 Midline structures intact. Vascular: Major intracranial vascular flow voids are maintained. Skull and upper cervical spine: Craniocervical junction within normal limits. Bone marrow signal intensity normal. No scalp soft tissue abnormality. Sinuses/Orbits: Patient status post bilateral ocular lens replacement. Globes orbital soft tissues demonstrate no acute  finding. Mild scattered mucosal thickening noted within the ethmoidal air cells and maxillary sinuses. No significant mastoid effusion. Inner ear structures normal. Other: None. IMPRESSION: 1. Patchy small volume acute ischemic infarct involving the posterior left frontal region, with involvement of the pre and postcentral gyri. No associated hemorrhage or mass effect. 2. Underlying age-related cerebral atrophy with moderate to advanced chronic microvascular ischemic disease. Electronically Signed   By: Rise Mu M.D.   On: 11/15/2019 00:28   ECHOCARDIOGRAM COMPLETE  Result Date: 11/14/2019   ECHOCARDIOGRAM REPORT   Patient Name:   Denise Leon Date of Exam: 11/14/2019 Medical Rec #:  [id number]    Height:       62.0 in Accession #:    [id number]   Weight:       136.5 lb Date of  Birth:  1945-12-03   BSA:          1.63 m Patient Age:    73 years     BP:           135/71 mmHg Patient Gender: F            HR:           80 bpm. Exam Location:  Inpatient Procedure: 2D Echo, Cardiac Doppler and Color Doppler Indications:    Stroke 434.91  History:        Patient has no prior history of Echocardiogram examinations.                 Stroke; Risk Factors:Non-Smoker.  Sonographer:    Tonia Ghent RDCS Referring Phys: [id number] ASHISH ARORA IMPRESSIONS  1. Left ventricular ejection fraction, by visual estimation, is 55 to 60%. The left ventricle has normal function. There is no left ventricular hypertrophy.  2. Left ventricular diastolic parameters are consistent with Grade I diastolic dysfunction (impaired relaxation).  3. The left ventricle has no regional wall motion abnormalities.  4. Global right ventricle has normal systolic function.The right ventricular size is normal. No increase in right ventricular wall thickness.  5. Left atrial size was normal.  6. Right atrial size was normal.  7. The mitral valve is normal in structure. No evidence of mitral valve regurgitation.  8. The tricuspid valve is normal in  structure.  9. The tricuspid valve is normal in structure. Tricuspid valve regurgitation is trivial. 10. The aortic valve is tricuspid. Aortic valve regurgitation is not visualized. Mild aortic valve sclerosis without stenosis. 11. Pulmonic regurgitation is mild. 12. The pulmonic valve was normal in structure. Pulmonic valve regurgitation is mild. 13. The inferior vena cava is normal in size with greater than 50% respiratory variability, suggesting right atrial pressure of 3 mmHg. FINDINGS  Left Ventricle: Left ventricular ejection fraction, by visual estimation, is 55 to 60%. The left ventricle has normal function. The left ventricle has no regional wall motion abnormalities. The left ventricular internal cavity size was the left ventricle is normal in size. There is no left ventricular hypertrophy. Left ventricular diastolic parameters are consistent with Grade I diastolic dysfunction (impaired relaxation). Right Ventricle: The right ventricular size is normal. No increase in right ventricular wall thickness. Global RV systolic function is has normal systolic function. Left Atrium: Left atrial size was normal in size. Right Atrium: Right atrial size was normal in size Pericardium: There is no evidence of pericardial effusion. Mitral Valve: The mitral valve is normal in structure. No evidence of mitral valve regurgitation. Tricuspid Valve: The tricuspid valve is normal in structure. Tricuspid valve regurgitation is trivial. Aortic Valve: The aortic valve is tricuspid. Aortic valve regurgitation is not visualized. Mild aortic valve sclerosis is present, with no evidence of aortic valve stenosis. Pulmonic Valve: The pulmonic valve was normal in structure. Pulmonic valve regurgitation is mild. Pulmonic regurgitation is mild. Aorta: The aortic root is normal in size and structure. Venous: The inferior vena cava is normal in size with greater than 50% respiratory variability, suggesting right atrial pressure of 3 mmHg.  IAS/Shunts: No atrial level shunt detected by color flow Doppler.  LEFT VENTRICLE PLAX 2D LVIDd:         4.70 cm       Diastology LVIDs:         3.70 cm       LV e' lateral:   6.49 cm/s LV PW:  0.80 cm       LV E/e' lateral: 8.8 LV IVS:        0.80 cm       LV e' medial:    5.27 cm/s LVOT diam:     1.90 cm       LV E/e' medial:  10.8 LV SV:         44 ml LV SV Index:   26.68 LVOT Area:     2.84 cm  LV Volumes (MOD) LV area d, A2C:    28.90 cm LV area d, A4C:    26.40 cm LV area s, A2C:    17.00 cm LV area s, A4C:    16.80 cm LV major d, A2C:   7.61 cm LV major d, A4C:   7.45 cm LV major s, A2C:   6.08 cm LV major s, A4C:   6.39 cm LV vol d, MOD A2C: 91.3 ml LV vol d, MOD A4C: 77.5 ml LV vol s, MOD A2C: 40.3 ml LV vol s, MOD A4C: 36.4 ml LV SV MOD A2C:     51.0 ml LV SV MOD A4C:     77.5 ml LV SV MOD BP:      46.2 ml RIGHT VENTRICLE RV S prime:     14.70 cm/s TAPSE (M-mode): 1.9 cm LEFT ATRIUM             Index       RIGHT ATRIUM          Index LA diam:        3.20 cm 1.97 cm/m  RA Area:     8.66 cm LA Vol (A2C):   22.1 ml 13.60 ml/m RA Volume:   15.70 ml 9.66 ml/m LA Vol (A4C):   18.5 ml 11.38 ml/m LA Biplane Vol: 21.2 ml 13.05 ml/m  AORTIC VALVE LVOT Vmax:   75.70 cm/s LVOT Vmean:  55.000 cm/s LVOT VTI:    0.146 m  AORTA Ao Root diam: 3.00 cm MITRAL VALVE MV Area (PHT): 2.34 cm             SHUNTS MV PHT:        93.96 msec           Systemic VTI:  0.15 m MV Decel Time: 324 msec             Systemic Diam: 1.90 cm MV E velocity: 56.90 cm/s 103 cm/s MV A velocity: 72.70 cm/s 70.3 cm/s MV E/A ratio:  0.78       1.5  Dietrich Pates MD Electronically signed by Dietrich Pates MD Signature Date/Time: 11/14/2019/1:18:05 PM    Final    CT HEAD CODE STROKE WO CONTRAST  Result Date: 11/14/2019 CLINICAL DATA:  Code stroke. EXAM: CT HEAD WITHOUT CONTRAST TECHNIQUE: Contiguous axial images were obtained from the base of the skull through the vertex without intravenous contrast. COMPARISON:  None. FINDINGS: Brain:  Generalized age-related cerebral atrophy. Patchy hypodensities seen involving the periventricular and deep white matter both cerebral hemispheres most consistent with chronic small vessel ischemic disease. No acute intracranial hemorrhage. There is question of subtle early loss of gray-white matter differentiation involving the supra ganglionic posterior left frontal region (series 2, image 18, suspicious for possible early developing acute left MCA territory infarct. Area of involvement encompasses the left M5 distribution. Otherwise, gray-white matter differentiation maintained. No mass lesion or midline shift. No hydrocephalus. No extra-axial fluid collection. Vascular: No hyperdense vessel. Scattered vascular calcifications noted within the carotid  siphons. Skull: Scalp soft tissues and calvarium within normal limits. Calvarium intact. Sinuses/Orbits: Right gaze noted. Globes and orbital soft tissues demonstrate no other paranasal sinuses are clear. No mastoid effusion. Acute finding. Other: None. ASPECTS [facility] Stroke Program Early CT Score) - Ganglionic level infarction (caudate, lentiform nuclei, internal capsule, insula, M1-M3 cortex): 7 - Supraganglionic infarction (M4-M6 cortex): 2 Total score (0-10 with 10 being normal): 9 IMPRESSION: 1. Question subtle loss of gray-white matter differentiation involving the posterior left frontal region, suspicious for early/developing acute left MCA territory ischemia given provided symptoms. No hyperdense vessel or intracranial hemorrhage. 2. ASPECTS is 9. 3. Age-related cerebral atrophy with moderate chronic microvascular ischemic disease. Critical Value/emergent results were called by telephone at the time of interpretation on 11/13/2019 at 11:55 pm to provider [facility] , who verbally acknowledged these results. Electronically Signed   By: Rise Mu M.D.   On: 11/14/2019 00:02   VAS [place] LOWER EXTREMITY VENOUS (DVT)  Result Date: 11/16/2019  Lower  Venous Study Indications: Stroke.  Anticoagulation: Lovenox. Comparison Study: No prior exam. Performing Technologist: Kennedy Bucker ARDMS, RVT  Examination Guidelines: A complete evaluation includes B-mode imaging, spectral Doppler, color Doppler, and power Doppler as needed of all accessible portions of each vessel. Bilateral testing is considered an integral part of a complete examination. Limited examinations for reoccurring indications may be performed as noted.  +---------+---------------+---------+-----------+----------+--------------+ RIGHT    CompressibilityPhasicitySpontaneityPropertiesThrombus Aging +---------+---------------+---------+-----------+----------+--------------+ CFV      Full           Yes      Yes                                 +---------+---------------+---------+-----------+----------+--------------+ SFJ      Full                                                        +---------+---------------+---------+-----------+----------+--------------+ FV Prox  Full                                                        +---------+---------------+---------+-----------+----------+--------------+ FV Mid   Full                                                        +---------+---------------+---------+-----------+----------+--------------+ FV DistalFull                                                        +---------+---------------+---------+-----------+----------+--------------+ PFV      Full                                                        +---------+---------------+---------+-----------+----------+--------------+  POP      Full           Yes      Yes                                 +---------+---------------+---------+-----------+----------+--------------+ PTV      Full                                                        +---------+---------------+---------+-----------+----------+--------------+ PERO     Full                                                         +---------+---------------+---------+-----------+----------+--------------+   +---------+---------------+---------+-----------+----------+--------------+ LEFT     CompressibilityPhasicitySpontaneityPropertiesThrombus Aging +---------+---------------+---------+-----------+----------+--------------+ CFV      Full           Yes      Yes                                 +---------+---------------+---------+-----------+----------+--------------+ SFJ      Full                                                        +---------+---------------+---------+-----------+----------+--------------+ FV Prox  Full                                                        +---------+---------------+---------+-----------+----------+--------------+ FV Mid   Full                                                        +---------+---------------+---------+-----------+----------+--------------+ FV DistalFull                                                        +---------+---------------+---------+-----------+----------+--------------+ PFV      Full                                                        +---------+---------------+---------+-----------+----------+--------------+ POP      Full           Yes      Yes                                 +---------+---------------+---------+-----------+----------+--------------+  PTV      Full                                                        +---------+---------------+---------+-----------+----------+--------------+ PERO     Full                                                        +---------+---------------+---------+-----------+----------+--------------+     Summary: Right: There is no evidence of deep vein thrombosis in the lower extremity. No cystic structure found in the popliteal fossa. Left: There is no evidence of deep vein thrombosis in the lower extremity. No cystic  structure found in the popliteal fossa.  *See table(s) above for measurements and observations. Electronically signed by Fabienne Bruns MD on 11/16/2019 at 5:05:29 PM.    Final     Labs:  Basic Metabolic Panel: Recent Labs  Lab 11/30/19 0628 12/02/19 0549  NA 140 139  K 4.6 4.5  CL 106 109  CO2 23 22  GLUCOSE 105* 95  BUN 34* 30*  CREATININE 1.63* 1.45*  CALCIUM 9.0 8.8*    CBC: Recent Labs  Lab 11/30/19 0628  WBC 11.1*  HGB 10.0*  HCT 30.3*  MCV 93.8  PLT 546*    CBG: Recent Labs  Lab 11/28/19 2219  GLUCAP 130*   Family history.  Mother and father with history of hypertension and hyperlipidemia.  Denies any diabetes mellitus colon cancer or esophageal cancer  Brief HPI:   Denise Leon is a 74 y.o. right-handed female with history of asthma, CKD stage III, hip and neck surgery as well as hypertension.  Per chart review lives with daughter.  Up until October patient had been living alone in [place].  Independent mobility prior to admission.  Daughter did assist with some basic ADLs.  Presented 11/14/2019 with headache right side weakness and facial droop.  Cranial CT scan showed question subtle loss of gray-white matter differentiation involving the posterior left frontal region suspicious for early developing acute left MCA territory ischemia.  Patient did receive TPA.  Admission chemistries BUN 28, creatinine 1.49, SARS coronavirus negative, urinalysis negative nitrite.  CT angiogram of head and neck negative for large vessel occlusion.  There was an incidental finding of a 19 mm left thyroid nodule advised to follow-up outpatient.  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  Echocardiogram with ejection fraction of 60% without emboli.  Neurology follow-up maintained on aspirin 325 mg daily and Plavix 75 mg daily.  Lovenox for DVT prophylaxis.  Patient was admitted  for a comprehensive rehab program.   Hospital Course: Denise Leon was admitted to rehab 11/16/2019 for inpatient therapies to consist of PT, ST and OT at least three hours five days a week. Past admission physiatrist, therapy team and rehab RN have worked together to provide customized collaborative inpatient rehab.  Pertaining to patient's patchy small infarction posterior left frontal region status post TPA.  Remains on aspirin and Plavix therapy with follow-up outpatient neurology services.  Lovenox for DVT prophylaxis venous Doppler studies negative.  Blood pressure control  with Toprol-XL no orthostasis follow-up outpatient PCP.  Melatonin for insomnia with good results.  Pain managed with use of Neurontin scheduled twice daily as well as a Lidoderm patch.  Continued as well with Cymbalta and Voltaren gel questionable neuropathic component.  CKD stage III creatinine 1.48 likely near baseline.  Again patient follow-up outpatient with PCP.  Lipitor ongoing for hyperlipidemia.   Blood pressures were monitored and remained controlled  SHe/ is continent of bowel and bladder.  SHe/ has made gains during rehab stay and is attending therapies  SHe/ will continue to receive follow up therapies   after discharge  Rehab course: During patient's stay in rehab weekly team conferences were held to monitor patient's progress, set goals and discuss barriers to discharge. At admission, patient required +2 physical assist sit to stand, moderate assist supine to sit, moderate assist ambulation, minimal assist for feeding moderate assist upper body bathing moderate assist lower body bathing moderate assist upper body dressing max assist lower body dressing moderate assist toilet transfers  Physical exam.  Blood pressure 141/64 pulse 73 temperature 98.4 respirations 18 oxygen saturations 100% room air Neurological.  Alert no acute distress makes good eye contact follows commands provides name age and date of birth.   She does appear to have some functional expressive receptive language deficits General.  No acute distress mood and affect appropriate Eyes.  Pupils round and reactive to light no discharge without nystagmus Neck.  Supple nontender no JVD without thyromegaly Cardiac regular rate rhythm without any extra sounds or murmur heard Respiratory effort normal no respiratory distress without wheeze GI.  Soft nontender positive bowel sounds without rebound Neurological muscular Skeletal.  Cranial nerves II through XII intact motor strength 5 out of 5 left deltoid bicep tricep grip hip flexors knee extensors ankle dorsi flexion and plantar flexion.  Trace right finger flexors trace right triceps otherwise 0 out of 5 in the right upper extremity 3- out of 5 at the right hip flexors knee extensor ankle dorsiflexor and plantar flexor.  Denise Leon  has had improvement in activity tolerance, balance, postural control as well as ability to compensate for deficits. Denise Leon has had improvement in functional use RUE/LUE  and RLE/LLE as well as improvement in awareness.  Sit to stand with supervision with ongoing cueing for recall of managing right upper extremity.  Ambulates rolling walker close supervision with cueing to ambulate with a base of assistive device.  Standing high low table on progressively compliant foam with 1 upper extremity support.  Patient completes ambulatory transfers with rolling walker and contact-guard assist fading to supervision overall for mobility.  Requires minimal assist for CM after toileting and supervision for hygiene seated.  SLP facilitated sessions by providing extra time and overall supervision level verbal cues for safety with using the commode and performing basic self-care task at the sink.  Patient participated in complex pegboard task with overall modified independence.  Full family teaching completed plan discharge to home       Disposition: Discharge to home    Diet:  Regular  Special Instructions: No driving smoking or alcohol  Continue aspirin and Plavix x3 months then aspirin alone  Medications at discharge 1.  Tylenol as needed 2 aspirin 325 mg p.o. daily  3 Lipitor 80 mg p.o. daily 4.  Plavix 75 mg p.o. daily 5.  Voltaren gel 2 g 3 times daily 6.  Cymbalta 30 mg p.o. daily 7.  Colace 100 mg twice daily 8.  Neurontin 400 mg twice  daily 9.  Xalatan eyedrops 1 drop both eyes nightly 10.  Lidoderm patch 2 patches change as directed 11.  Toprol-XL 25 mg p.o. daily 12.  Protonix 40 mg p.o. daily  Discharge Instructions    Ambulatory referral to Neurology   Complete by: As directed    An appointment is requested in approximately 4 weeks inferior posterior left frontal region infarction   Ambulatory referral to Physical Medicine Rehab   Complete by: As directed    Moderate complexity follow up 1-2 weeks inferior posterior left frontal region infarct      Follow-up Information    Raulkar, Clide Deutscher, MD Follow up.   Specialty: Physical Medicine and Rehabilitation Why: Office to call for appointment Contact information: [address] [place] [address] [phone]        Deboraha Sprang, MD Follow up.   Specialty: Cardiology Why: Call for appointment Contact information: [address] [place] [address] [phone]           Signed: Lavon Paganini [place] 12/04/2019, 5:30 AM

## 2019-12-02 NOTE — Progress Notes (Addendum)
Physical Therapy Discharge Summary  Patient Details  Name: Denise Leon MRN: [id number] Date of Birth: July 03, 1946  Today's Date: 12/02/2019   Patient has met 9 of 9 long term goals due to improved activity tolerance, improved balance, improved postural control, increased strength, ability to compensate for deficits, functional use of  right upper extremity and right lower extremity and improved coordination.  Patient to discharge at an ambulatory level close supervision with RW except min assist for single step negotiation with RW.   Patient's care partner is independent to provide the necessary physical and cognitive assistance at discharge.  Reasons goals not met: n/a  Recommendation:  Patient will benefit from ongoing skilled PT services in home health setting to continue to advance safe functional mobility, address ongoing impairments in R NMR, endurance, strengthening, progress gait with LRAD, stair negotiation, memory, safety awareness, and minimize fall risk.  Equipment: RW with R hand orthosis, hemi height 16x16 w/c  Reasons for discharge: treatment goals met  Patient/family agrees with progress made and goals achieved: Yes  PT Discharge Precautions/Restrictions Precautions Precautions: Fall Precaution Comments: 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) Restrictions Weight Bearing Restrictions: No  Vision/Perception  Pt wears glasses for reading only at baseline. No changes from baseline vision. Decreased attention to R side of body.  Cognition Overall Cognitive Status: Difficult to assess Arousal/Alertness: Awake/alert Orientation Level: Oriented X4 Memory: Impaired Memory Impairment: Decreased recall of new information Awareness: Impaired Awareness Impairment: Anticipatory impairment Problem Solving: Impaired Problem Solving Impairment: Functional complex Safety/Judgment: Appears intact   Sensation Sensation Light Touch: Impaired by gross assessment(pt with hx of numbness & tingling &  burning in BLE (pt & daughter report pt has back issue & needs surgery but pt has declined this)) Proprioception: Impaired by gross assessment Coordination Gross Motor Movements are Fluid and Coordinated: No Fine Motor Movements are Fluid and Coordinated: No Coordination and Movement Description: 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), pt also 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   Motor  Motor Motor: Abnormal postural alignment and control Motor - Discharge Observations: 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), generalized deconditioning   Mobility Bed Mobility Bed Mobility: Rolling Right;Rolling Left;Sit to Supine;Right Sidelying to Sit;Left Sidelying to Sit(bed flat, no rails, cuing for technique & UE placement) Rolling Right: Supervision/verbal cueing Rolling Left: Supervision/Verbal cueing Right Sidelying to Sit: Supervision/Verbal cueing Left Sidelying to Sit: Supervision/Verbal cueing Sit to Supine: Supervision/Verbal cueing Transfers Transfers: Sit to Stand;Stand to Sit;Stand Pivot Transfers Sit to Stand: Supervision/Verbal cueing Stand to Sit: Supervision/Verbal cueing Stand Pivot Transfers: Supervision/Verbal cueing Transfer (Assistive device): None  Locomotion  Gait Ambulation: Yes Gait Assistance: Supervision/Verbal cueing Gait Distance (Feet): 150 Feet Assistive device: Rolling walker(R hand orthosis) Gait Assistance Details: (cuing to ambulate within base of AD, upright posture, hnad placement on orthosis) Gait Gait: Yes Gait Pattern: Impaired Gait Pattern: Decreased hip/knee flexion - right;Decreased dorsiflexion - right;Decreased stride length;Decreased stance time - right;Decreased step length - right Gait velocity: decreased Stairs / Additional Locomotion Stairs: Yes Stairs Assistance: Minimal Assistance - Patient > 75% Stair Management Technique: No rails;With walker Number of Stairs: 1 Height of Stairs: 4(inches)   Trunk/Postural Assessment  Cervical Assessment Cervical  Assessment: Exceptions to WFL(slightly rounded shoulders) Thoracic Assessment Thoracic Assessment: Within Functional Limits Lumbar Assessment Lumbar Assessment: Exceptions to WFL(posterior pelvic tilt) Postural Control Postural Control: Deficits on evaluation Righting Reactions: delayed Protective Responses: delayed   Balance Balance Balance Assessed: Yes Static Sitting Balance Static Sitting - Balance Support: Feet supported Static Sitting - Level of Assistance: 7: Independent Dynamic Sitting  Balance Dynamic Sitting - Balance Support: Feet supported;During functional activity Dynamic Sitting - Level of Assistance: 5: Stand by assistance Static Standing Balance Static Standing - Balance Support: During functional activity;No upper extremity supported(hand hygiene standing at sink) Static Standing - Level of Assistance: 5: Stand by assistance  11/29/19 Merrilee Jansky Balance Test 30/56. Patient demonstrates increased fall risk as noted by score of 30/56 on Berg Balance Scale.  (<36= high risk for falls, close to 100%; 37-45 significant >80%; 46-51 moderate >50%; 52-55 lower >25%)   Extremity Assessment  RLE Assessment RLE Assessment: Exceptions to [facility] Strength Comments: Globally 3+ to 4-/5 LLE Assessment LLE Assessment: Within Functional Limits   Lavone Nian, PT, DPT 12/02/2019, 12:37 PM  Aryahi Denzler Clent Demark 12/03/2019, 6:59 PM

## 2019-12-02 NOTE — Telephone Encounter (Signed)
Tanya from [facility] called asking what doctor is assigned to patient. We do not have a discharge yet to know who patient will be seeing in the office.

## 2019-12-02 NOTE — Progress Notes (Signed)
Occupational Therapy Session Note  Patient Details  Name: Denise Leon MRN: [id number] Date of Birth: 1946-08-19  Today's Date: 12/02/2019 OT Individual Time: 9794-8016 OT Individual Time Calculation (min): 53 min    Short Term Goals: Week 1:  OT Short Term Goal 1 (Week 1): STGs= LTGs secondary to estimated short LOS  Skilled Therapeutic Interventions/Progress Updates:    1;1. Pt received in bed agreeable to tx and working on arm. Pt completes ambulatory transfer with RW and CGA fading to S overall for mobility. Pt requires mIN A for CM after toileting and S for hygiene seated. Pt completes standing hand hygiene with S. For time management total A to propel w/c to tx gym. Reaching, pinching and gross grasp tasks completed at tabletop using blocks, foam legos and clothespins. Pt uses significant shoulder hike and trunk compensations for decreased shoulder ROM this date requiring mod VC and min tactile input ot decrease compensatory strategies. Exited session with pt seated in bed, exit alarm on and call light in reach  Box and blocks assessment RUE 19 LUE 44   Therapy Documentation Precautions:  Precautions Precautions: Fall Precaution Comments: 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) Restrictions Weight Bearing Restrictions: No General:   Vital Signs: Therapy Vitals Temp: 98.9 F (37.2 C) Temp Source: Oral Pulse Rate: 83 Resp: 18 BP: 127/71 Patient Position (if appropriate): Lying Oxygen Therapy SpO2: 99 % O2 Device: Room Air Pain: Pain Assessment Pain Scale: 0-10 Pain Score: 0-No pain Faces Pain Scale: No hurt ADL:   Vision   Perception    Praxis   Exercises:   Other Treatments:     Therapy/Group: Individual Therapy  Shon Hale 12/02/2019, 2:21 PM

## 2019-12-02 NOTE — Plan of Care (Signed)
  Problem: Consults Goal: RH STROKE PATIENT EDUCATION Description: See Patient Education module for education specifics  Outcome: Progressing   Problem: RH BOWEL ELIMINATION Goal: RH STG MANAGE BOWEL WITH ASSISTANCE Description: STG Manage Bowel with Assistance. Min Outcome: Progressing Goal: RH STG MANAGE BOWEL W/MEDICATION W/ASSISTANCE Description: STG Manage Bowel with Medication with Assistance. Min Outcome: Progressing   Problem: RH BLADDER ELIMINATION Goal: RH STG MANAGE BLADDER WITH ASSISTANCE Description: STG Manage Bladder With Assistance. Min Outcome: Progressing   Problem: RH SKIN INTEGRITY Goal: RH STG SKIN FREE OF INFECTION/BREAKDOWN Description: Skin will remain free of breakdown and infection while on rehab with min assist Outcome: Progressing Goal: RH STG MAINTAIN SKIN INTEGRITY WITH ASSISTANCE Description: STG Maintain Skin Integrity With Assistance. Min Outcome: Progressing   Problem: RH SAFETY Goal: RH STG ADHERE TO SAFETY PRECAUTIONS W/ASSISTANCE/DEVICE Description: STG Adhere to Safety Precautions With Assistance/Device. Mod I Outcome: Progressing   Problem: RH PAIN MANAGEMENT Goal: RH STG PAIN MANAGED AT OR BELOW PT'S PAIN GOAL Description: Less than 4 Outcome: Progressing   Problem: RH KNOWLEDGE DEFICIT Goal: RH STG INCREASE KNOWLEDGE OF DYSPHAGIA/FLUID INTAKE Description: Patient/caregiver will be able to verbalize safe swallowing for patient Outcome: Progressing Goal: RH STG INCREASE KNOWLEDGE OF STROKE PROPHYLAXIS Description: Patient/caregiver will be able to describe medicines to prevent stroke and how to take them, when to report to MD with cues/handouts Outcome: Progressing   

## 2019-12-02 NOTE — Progress Notes (Signed)
Physical Therapy Session Note  Patient Details  Name: Denise Leon MRN: [id number] Date of Birth: 09-13-1946  Today's Date: 12/02/2019 PT Individual Time: 1109-1204 PT Individual Time Calculation (min): 55 min   Short Term Goals: Week 2:  PT Short Term Goal 1 (Week 2): STG = LTG due to estimated d/c date.  Skilled Therapeutic Interventions/Progress Updates:  Pt received in recliner & agreeable to tx. Therapist donned B ted hose for time management, pt dons tennis shoes with set up assist from recliner.  Sit<>stand with supervision with ongoing cuing for recall of managing RUE on hand orthosis. Gait room<>dayroom with RW & close supervision with cuing to ambulate within base of AD with pt requiring 1 standing rest break 2/2 fatigue.  Standing at high/low table on progressively compliant foam with 1 UE support with cuing to decrease lean on table while engaging in stacking/unstacking Jenga blocks with RUE x 7 minutes + 3 minutes with focus on standing tolerance & balance (pt requires close supervision) & RUE NMR through forced use. Pt with impaired fine motor control & ability to grasp blocks especially when unstacking them.  Nu-step up to level 4 x 10 minutes with all four extremities with RUE hand support with focus on R NMR, coordination of reciprocal movements, global strengthening, & endurance training.   In recliner, pt doffed shoes with cuing to do so & therapist doffed ted hose upon pt's request. Pt donned non skid socks with extra time. Pt left in recliner with chair alarm donned, call bell & all needs in reach.  Therapy Documentation Precautions:  Precautions Precautions: Fall Precaution Comments: 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) Restrictions Weight Bearing Restrictions: No  Pain: Pt c/o L shoulder pain during nu-step activity & therapist recommended pt not use LUE.   Therapy/Group: Individual Therapy  Sandi Mariscal 12/02/2019, 12:35 PM

## 2019-12-02 NOTE — Progress Notes (Signed)
Occupational Therapy Note  Patient Details  Name: Cortasia Screws MRN: [id number] Date of Birth: [birth year]    Pt missed 30 min skilled OT d/t refusal for no medical reason. Pt states, "I just came from the gym, I want to rest." Despite encouragement to participate, pt declines tx at this time   Shon Hale 12/02/2019, 1:06 PM

## 2019-12-03 ENCOUNTER — Inpatient Hospital Stay (HOSPITAL_COMMUNITY): Payer: Medicare Other | Admitting: Physical Therapy

## 2019-12-03 ENCOUNTER — Inpatient Hospital Stay (HOSPITAL_COMMUNITY): Payer: Medicare Other | Admitting: Occupational Therapy

## 2019-12-03 ENCOUNTER — Inpatient Hospital Stay (HOSPITAL_COMMUNITY): Payer: Medicare Other | Admitting: Speech Pathology

## 2019-12-03 MED ORDER — PANTOPRAZOLE SODIUM 40 MG PO TBEC: 40.0000 mg | DELAYED_RELEASE_TABLET | Freq: Every day | ORAL | 0 refills | Status: DC

## 2019-12-03 MED ORDER — ATORVASTATIN CALCIUM 80 MG PO TABS: 80.0000 mg | ORAL_TABLET | Freq: Every day | ORAL | 0 refills | Status: DC

## 2019-12-03 MED ORDER — GABAPENTIN 400 MG PO CAPS: 400.0000 mg | ORAL_CAPSULE | Freq: Two times a day (BID) | ORAL | 1 refills | Status: DC

## 2019-12-03 MED ORDER — ACETAMINOPHEN 325 MG PO TABS: 650.0000 mg | ORAL_TABLET | ORAL | Status: DC | PRN

## 2019-12-03 MED ORDER — CLOPIDOGREL BISULFATE 75 MG PO TABS: 75.0000 mg | ORAL_TABLET | Freq: Every day | ORAL | 0 refills | Status: DC

## 2019-12-03 MED ORDER — METOPROLOL SUCCINATE ER 25 MG PO TB24: 25.0000 mg | ORAL_TABLET | Freq: Every day | ORAL | 0 refills | Status: DC

## 2019-12-03 MED ORDER — LATANOPROST 0.005 % OP SOLN: 1.0000 [drp] | Freq: Every day | OPHTHALMIC | 12 refills | Status: AC

## 2019-12-03 MED ORDER — DICLOFENAC SODIUM 1 % EX GEL: 2.0000 g | Freq: Three times a day (TID) | CUTANEOUS | 0 refills | Status: DC

## 2019-12-03 MED ORDER — LIDOCAINE 5 % EX PTCH: 2.0000 | MEDICATED_PATCH | CUTANEOUS | 0 refills | Status: DC

## 2019-12-03 MED ORDER — DULOXETINE HCL 30 MG PO CPEP: 30.0000 mg | ORAL_CAPSULE | Freq: Every day | ORAL | 3 refills | Status: DC

## 2019-12-03 MED ORDER — DOCUSATE SODIUM 100 MG PO CAPS: 100.0000 mg | ORAL_CAPSULE | Freq: Two times a day (BID) | ORAL | 0 refills | Status: DC

## 2019-12-03 NOTE — Progress Notes (Signed)
[facility] PHYSICAL MEDICINE & REHABILITATION PROGRESS NOTE   Subjective/Complaints: Up in bed. Talking with family. Slept well. No pain. Excited about dc date tomorrow  ROS: Patient denies fever, rash, sore throat, blurred vision, nausea, vomiting, diarrhea, cough, shortness of breath or chest pain, joint or back pain, headache, or mood change.    Objective:   No results found. No results for input(s): WBC, HGB, HCT, PLT in the last 72 hours. Recent Labs    12/02/19 0549  NA 139  K 4.5  CL 109  CO2 22  GLUCOSE 95  BUN 30*  CREATININE 1.45*  CALCIUM 8.8*    Intake/Output Summary (Last 24 hours) at 12/03/2019 0949 Last data filed at 12/03/2019 0725 Gross per 24 hour  Intake 600 ml  Output 200 ml  Net 400 ml     Physical Exam: Vital Signs Blood pressure (!) 130/59, pulse 81, temperature 98.5 F (36.9 C), temperature source Oral, resp. rate 18, height 5\' 2"  (1.575 m), weight 63.2 kg, SpO2 95 %. Constitutional: No distress . Vital signs reviewed. HEENT: EOMI, oral membranes moist Neck: supple Cardiovascular: RRR without murmur. No JVD    Respiratory: CTA Bilaterally without wheezes or rales. Normal effort    GI: BS +, non-tender, non-distended  Extremities: No clubbing, cyanosis, or edema Skin: No evidence of breakdown, no evidence of rash  MSK: mild pain at right shoulder with ABD, no foot pain Neurologic: AOx3. Cranial nerves II through XII intact/ Motor 5/5 LUE and LLE. RUE 2- to 2+/5pec/deltoid,   biceps and  3-/5 wrist and finger flexors- . RLE:3/5 HF, KE and 4/5 ADF/PF. Senses pain and LT. No resting tone.  Psych: pleasant   Assessment/Plan: 1. 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  Physiatrist is providing close team supervision and 24 hour management of active medical problems listed below.  Physiatrist and rehab team continue to assess  barriers to discharge/monitor patient progress toward functional and medical goals  Care Tool:  Bathing  Bathing activity did not occur: Refused Body parts bathed by patient: Chest, Abdomen, Front perineal area, Right upper leg, Left upper leg, Right arm, Left arm, Buttocks, Right lower leg, Left lower leg, Face   Body parts bathed by helper: Right arm, Left arm, Buttocks, Left lower leg, Right lower leg     Bathing assist Assist Level: Contact Guard/Touching assist     Upper Body Dressing/Undressing Upper body dressing   What is the patient wearing?: Pull over shirt    Upper body assist Assist Level: Contact Guard/Touching assist    Lower Body Dressing/Undressing Lower body dressing      What is the patient wearing?: Pants, Underwear/pull up     Lower body assist Assist for lower body dressing: Contact Guard/Touching assist     Toileting Toileting    Toileting assist Assist for toileting: Supervision/Verbal cueing     Transfers Chair/bed transfer  Transfers assist     Chair/bed transfer assist level: Supervision/Verbal cueing     Locomotion Ambulation   Ambulation assist      Assist level: Supervision/Verbal cueing Assistive device: Walker-rolling Max distance: 150 ft   Walk 10 feet activity   Assist  Walk 10 feet activity did not occur: Safety/medical concerns  Assist level: Supervision/Verbal cueing Assistive device: Walker-rolling   Walk 50 feet activity   Assist Walk 50 feet with 2 turns activity did not occur: Safety/medical concerns  Assist level:  Supervision/Verbal cueing Assistive device: Walker-rolling    Walk 150 feet activity   Assist Walk 150 feet activity did not occur: Safety/medical concerns  Assist level: Supervision/Verbal cueing Assistive device: Walker-rolling    Walk 10 feet on uneven surface  activity   Assist Walk 10 feet on uneven surfaces activity did not occur: Safety/medical concerns   Assist level:  Supervision/Verbal cueing Assistive device: Aeronautical engineer Will patient use wheelchair at discharge?: No Type of Wheelchair: Manual Wheelchair activity did not occur: N/A  Wheelchair assist level: Supervision/Verbal cueing Max wheelchair distance: 5'    Wheelchair 50 feet with 2 turns activity    Assist    Wheelchair 50 feet with 2 turns activity did not occur: N/A   Assist Level: Supervision/Verbal cueing   Wheelchair 150 feet activity     Assist  Wheelchair 150 feet activity did not occur: N/A       Blood pressure (!) 130/59, pulse 81, temperature 98.5 F (36.9 C), temperature source Oral, resp. rate 18, height 5\' 2"  (1.575 m), weight 63.2 kg, SpO2 95 %.  Medical Problem List and Plan: 1.Right side weakness with facial droopsecondary to patchy small infarct posterior left frontal region. Status post TPA. Plan 30 day event monitor. -patient may shower -ELOS 2/19  Patient to see Dr. Ranell Patrick in the office for transitional care encounter in 1-2 weeks.   -Continue CIR PT, OT, SLP 2. Antithrombotics: -DVT/anticoagulation:Lovenox. Lower extremity Dopplers negative -antiplatelet therapy: Aspirin 325 mg daily and Plavix 75 mg daily 3. Pain Management:Neurontin 400 mg twice daily, Lidoderm patch  -Ice prn to right medial arch  - continue above as well as cymbalta, votaren gel for foot/shoulder pain. ?neuropathic component.   2/8- pain remains well controlled.  4. Mood:Melatonin 3 mg nightly -antipsychotic agents: N/A  2/12: sleeping well at night.  5. Neuropsych: This patientiscapable of making decisions on herown behalf. 6. Skin/Wound Care:Routine skin checks 7. Fluids/Electrolytes/Nutrition:   Eating well off megace 8. Hypertension. Toprol-XL 25 mg daily. Monitor with increased mobility   Vitals:   12/03/19 0321 12/03/19 0753  BP: (!) 157/88 (!) 130/59  Pulse: 81    Resp: 18   Temp: 98.5 F (36.9 C)   SpO2: 95%   controlled 2/15  2/18 BP under reasonable control  9. CKD stage III.  ->Cr at 1.48 likely near baseline  2/15 C4 1.63   -pushing fluids  2/17: BMET personally reviewed, Cr improved to 1.45  2/18 continue to encourage fluids 10. Hyperlipidemia. Lipitor 11. Constipation:  Improving  - scheduled colace bid         LOS: 17 days A FACE TO FACE EVALUATION WAS PERFORMED  Meredith Staggers 12/03/2019, 9:49 AM

## 2019-12-03 NOTE — Progress Notes (Signed)
Physical Therapy Session Note  Patient Details  Name: Denise Leon MRN: [id number] Date of Birth: 1946-08-22  Today's Date: 12/03/2019 PT Individual Time: 1417-1500 PT Individual Time Calculation (min): 43 min   Short Term Goals: Week 2:  PT Short Term Goal 1 (Week 2): STG = LTG due to estimated d/c date.  Skilled Therapeutic Interventions/Progress Updates: Pt presents reclined in recliner chair.  Pt agreeable to participate in therapy.  Pt amb multiple trials w/ RW, managing own hand orthotic on RW, up to 100' w/ supervision.  Pt performed short distance gait trials w/ RW and supervision in confined spaces including turns.  Pt transferred in/out of car w/ supervision, car raised to SUV height.  Pt performed standing at dynavision to use bilateral UEs on lower quadrants, left hand for upper quadrants, reaching across midline and outside of BOS w/o LOB.  Pt w/ improved movement of right UE.  Pt returned to recliner w/ all needs within reach and seat alarm on.     Therapy Documentation Precautions:  Precautions Precautions: Fall Precaution Comments: 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) Restrictions Weight Bearing Restrictions: No General:   Vital Signs: Therapy Vitals Temp: 98.6 F (37 C) Pulse Rate: 85 Resp: 20 BP: (!) 157/76 Patient Position (if appropriate): Lying Oxygen Therapy SpO2: 99 % O2 Device: Room Air Pain:no c/o pain.   Mobility: Bed Mobility Supine to Sit: Supervision/Verbal cueing Sit to Supine: Supervision/Verbal cueing Transfers Transfers: Sit to Stand;Stand to Sit;Stand Pivot Transfers Sit to Stand: Supervision/Verbal cueing Stand to Sit: Supervision/Verbal cueing Stand Pivot Transfers: Supervision/Verbal cueing Locomotion :    Trunk/Postural Assessment : Cervical Assessment Cervical Assessment: Exceptions to WFL(slightly rounded shoulders) Thoracic Assessment Thoracic Assessment: Within Functional Limits Lumbar Assessment Lumbar Assessment: Exceptions to WFL(posterior  pelvic tilt)  Balance: Static Sitting Balance Static Sitting - Balance Support: Feet supported Static Sitting - Level of Assistance: 7: Independent Dynamic Sitting Balance Dynamic Sitting - Balance Support: Feet supported;During functional activity Dynamic Sitting - Level of Assistance: 5: Stand by assistance Static Standing Balance Static Standing - Balance Support: During functional activity;No upper extremity supported Static Standing - Level of Assistance: 5: Stand by assistance Dynamic Standing Balance Dynamic Standing - Balance Support: No upper extremity supported;During functional activity Dynamic Standing - Level of Assistance: 5: Stand by assistance Exercises:   Other Treatments:      Therapy/Group: Individual Therapy  Lucio Edward 12/03/2019, 3:43 PM

## 2019-12-03 NOTE — Discharge Instructions (Signed)
Inpatient Rehab Discharge Instructions  Denise Leon Discharge date and time: No discharge date for patient encounter.   Activities/Precautions/ Functional Status: Activity: activity as tolerated Diet: soft Wound Care: none needed Functional status:  ___ No restrictions     ___ Walk up steps independently ___ 24/7 supervision/assistance   ___ Walk up steps with assistance ___ Intermittent supervision/assistance  ___ Bathe/dress independently ___ Walk with walker     __x_ Bathe/dress with assistance ___ Walk Independently    ___ Shower independently ___ Walk with assistance    ___ Shower with assistance ___ No alcohol     ___ Return to work/school ________    COMMUNITY REFERRALS UPON DISCHARGE:    Home Health:   PT     OT     ST                      Agency:  [facility]   Phone: [phone]    Medical Equipment/Items Ordered:  Wheelchair, cushion                                                     Agency/Supplier: [facility] - referral placed through [facility] who will be placing/ coordinating start of services.       Special Instructions: No driving smoking or alcohol  Continue aspirin 325 mg daily and Plavix 75 mg daily x3 months then aspirin alone  Follow-up outpatient for incidental findings of left thyroid nodule recommendations of ultrasound   STROKE/TIA DISCHARGE INSTRUCTIONS SMOKING Cigarette smoking nearly doubles your risk of having a stroke & is the single most alterable risk factor  If you smoke or have smoked in the last 12 months, you are advised to quit smoking for your health.  Most of the excess cardiovascular risk related to smoking disappears within a year of stopping.  Ask you doctor about anti-smoking medications  [facility] Quit Line: 1-800-QUIT NOW  Free Smoking Cessation Classes [phone]  CHOLESTEROL Know your levels; limit fat & cholesterol in your diet  Lipid Panel       Component Value Date/Time   CHOL 235 (H) 11/14/2019 0926   TRIG 171 (H) 11/14/2019 0926   HDL 45 11/14/2019 0926   CHOLHDL 5.2 11/14/2019 0926   VLDL 34 11/14/2019 0926   LDLCALC 156 (H) 11/14/2019 0926      Many patients benefit from treatment even if their cholesterol is at goal.  Goal: Total Cholesterol (CHOL) less than 160  Goal:  Triglycerides (TRIG) less than 150  Goal:  HDL greater than 40  Goal:  LDL (LDLCALC) less than 100   BLOOD PRESSURE American Stroke Association blood pressure target is less that 120/80 mm/Hg  Your discharge blood pressure is:  BP: (!) 148/74  Monitor your blood pressure  Limit your salt and alcohol intake  Many individuals will require more than one medication for high blood pressure  DIABETES (A1c is a blood sugar average for last 3 months) Goal HGBA1c is under 7% (HBGA1c is blood sugar average for last 3 months)  Diabetes: No known diagnosis of diabetes    Lab Results  Component Value Date   HGBA1C 5.7 (H) 11/14/2019     Your HGBA1c can be lowered with medications, healthy diet, and exercise.  Check your blood  sugar as directed by your physician  Call your physician if you experience unexplained or low blood sugars.  PHYSICAL ACTIVITY/REHABILITATION Goal is 30 minutes at least 4 days per week  Activity: Increase activity slowly, Therapies: Physical Therapy: Home Health Return to work:   Activity decreases your risk of heart attack and stroke and makes your heart stronger.  It helps control your weight and blood pressure; helps you relax and can improve your mood.  Participate in a regular exercise program.  Talk with your doctor about the best form of exercise for you (dancing, walking, swimming, cycling).  DIET/WEIGHT Goal is to maintain a healthy weight  Your discharge diet is:  Diet Order            DIET DYS 3 Room service appropriate? Yes with Assist; Fluid consistency: Thin  Diet effective now               liquids Your height is:    Your current weight is: Weight: 63.6 kg Your Body Mass Index (BMI) is:  BMI (Calculated): 25.64  Following the type of diet specifically designed for you will help prevent another stroke.  Your goal weight range is:    Your goal Body Mass Index (BMI) is 19-24.  Healthy food habits can help reduce 3 risk factors for stroke:  High cholesterol, hypertension, and excess weight.  RESOURCES Stroke/Support Group:  Call [phone]   STROKE EDUCATION PROVIDED/REVIEWED AND GIVEN TO PATIENT Stroke warning signs and symptoms How to activate emergency medical system (call 911). Medications prescribed at discharge. Need for follow-up after discharge. Personal risk factors for stroke. Pneumonia vaccine given:  Flu vaccine given:  My questions have been answered, the writing is legible, and I understand these instructions.  I will adhere to these goals & educational materials that have been provided to me after my discharge from the hospital.      My questions have been answered and I understand these instructions. I will adhere to these goals and the provided educational materials after my discharge from the hospital.  Patient/Caregiver Signature _______________________________ Date __________  Clinician Signature _______________________________________ Date __________  Please bring this form and your medication list with you to all your follow-up doctor's appointments.

## 2019-12-03 NOTE — Progress Notes (Signed)
Occupational Therapy Discharge Summary  Patient Details  Name: Denise Leon MRN: [id number] Date of Birth: 1945-12-25  Today's Date: 12/03/2019 OT Individual Time: 3428-7681 OT Individual Time Calculation (min): 42 min   OT treatment session focused on increased independence with BADL tasks. Pt able to access dresser drawers, collect clothing, ambulate to bathroom with RWw/ supervision from OT. Bathing/dressing completed with supervision and with increased time. See functional navigator for further details. Pt left seated in recliner at end of session with needs met, chair alarm on, and call bell in reach.   Patient has met 11 of 11 long term goals due to improved activity tolerance, improved balance, postural control, ability to compensate for deficits, functional use of  RIGHT upper and RIGHT lower extremity, improved attention, improved awareness and improved coordination.  Patient to discharge at overall Supervision level.  Patient's care partner is independent to provide the necessary physical and cognitive assistance at discharge.    Reasons goals not met: n/a  Recommendation:  Patient will benefit from ongoing skilled OT services in home health setting to continue to advance functional skills in the area of BADL and functional use of R UE.  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) Restrictions Weight Bearing Restrictions: No Pain  denies pain ADL ADL Eating: Supervision/safety, Set up Grooming: Supervision/safety Upper Body Bathing: Supervision/safety Lower Body Bathing: Supervision/safety Upper Body Dressing: Supervision/safety Lower Body Dressing: Supervision/safety Toileting: Supervision/safety Toilet Transfer:  Distant supervision Tub/Shower Transfer: Close supervison Praxis Praxis: Intact Cognition Overall Cognitive Status: Impaired/Different from baseline Arousal/Alertness: Awake/alert Orientation Level: Oriented to person;Oriented to place;Oriented to situation Memory: Impaired Memory Impairment: Decreased recall of new information Awareness: Impaired Awareness Impairment: Anticipatory impairment Problem Solving: Impaired Safety/Judgment: Appears intact Sensation Coordination Gross Motor Movements are Fluid and Coordinated: No Fine Motor Movements are Fluid and Coordinated: No Coordination and Movement Description: 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), pt also 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 Motor  Motor Motor: Abnormal postural alignment and control Motor - Discharge Observations: 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), generalized deconditioning Mobility  Bed Mobility Supine to Sit: Supervision/Verbal cueing Sit to Supine: Supervision/Verbal cueing Transfers Sit to Stand: Supervision/Verbal cueing Stand to Sit: Supervision/Verbal cueing  Trunk/Postural Assessment  Cervical Assessment Cervical Assessment: Exceptions to WFL(slightly rounded shoulders) Thoracic Assessment Thoracic Assessment: Within Functional Limits Lumbar Assessment Lumbar Assessment: Exceptions to WFL(posterior pelvic tilt)  Balance Static Sitting Balance Static Sitting - Balance Support: Feet supported Static Sitting - Level of Assistance: 7: Independent Dynamic Sitting Balance Dynamic Sitting - Balance Support: Feet supported;During functional activity Dynamic Sitting - Level of Assistance: 5: Stand by assistance Static Standing Balance Static Standing - Balance Support: During functional activity;No upper extremity supported Static Standing - Level of Assistance: 5: Stand by assistance Dynamic Standing Balance Dynamic Standing - Balance Support: No upper extremity supported;During functional activity Dynamic  Standing - Level of Assistance: 5: Stand by assistance Extremity/Trunk Assessment RUE Assessment RUE Assessment: Exceptions to [facility] Passive Range of Motion (PROM) Comments: WNL RUE Body System: Neuro Brunstrum levels for arm and hand: Arm;Hand Brunstrum level for arm: Stage III Synergy is performed voluntarily Brunstrum level for hand: Stage III Synergies performed voluntarily RUE AROM (degrees)  Right Elbow Flexion 130  Right Shoulder Flexion 80 Degrees   LUE Assessment LUE Assessment: Exceptions to [facility] Strength Comments:  pt reports impaired shoulder mobility 2/2 hx of frozen shoulder - shoulder elevation 60 degrees   Denise Leon Denise Leon 12/03/2019, 12:54 PM

## 2019-12-03 NOTE — Plan of Care (Signed)
  Problem: Consults Goal: RH STROKE PATIENT EDUCATION Description: See Patient Education module for education specifics  Outcome: Progressing   Problem: RH BOWEL ELIMINATION Goal: RH STG MANAGE BOWEL WITH ASSISTANCE Description: STG Manage Bowel with Assistance. Min Outcome: Progressing Goal: RH STG MANAGE BOWEL W/MEDICATION W/ASSISTANCE Description: STG Manage Bowel with Medication with Assistance. Min Outcome: Progressing   Problem: RH BLADDER ELIMINATION Goal: RH STG MANAGE BLADDER WITH ASSISTANCE Description: STG Manage Bladder With Assistance. Min Outcome: Progressing   Problem: RH SKIN INTEGRITY Goal: RH STG SKIN FREE OF INFECTION/BREAKDOWN Description: Skin will remain free of breakdown and infection while on rehab with min assist Outcome: Progressing Goal: RH STG MAINTAIN SKIN INTEGRITY WITH ASSISTANCE Description: STG Maintain Skin Integrity With Assistance. Min Outcome: Progressing   Problem: RH SAFETY Goal: RH STG ADHERE TO SAFETY PRECAUTIONS W/ASSISTANCE/DEVICE Description: STG Adhere to Safety Precautions With Assistance/Device. Mod I Outcome: Progressing   Problem: RH PAIN MANAGEMENT Goal: RH STG PAIN MANAGED AT OR BELOW PT'S PAIN GOAL Description: Less than 4 Outcome: Progressing   Problem: RH KNOWLEDGE DEFICIT Goal: RH STG INCREASE KNOWLEDGE OF DYSPHAGIA/FLUID INTAKE Description: Patient/caregiver will be able to verbalize safe swallowing for patient Outcome: Progressing Goal: RH STG INCREASE KNOWLEDGE OF STROKE PROPHYLAXIS Description: Patient/caregiver will be able to describe medicines to prevent stroke and how to take them, when to report to MD with cues/handouts Outcome: Progressing

## 2019-12-03 NOTE — Progress Notes (Signed)
Physical Therapy Session Note  Patient Details  Name: Denise Leon MRN: [id number] Date of Birth: 1946/03/22  Today's Date: 12/03/2019 PT Individual Time: 0815-0856 PT Individual Time Calculation (min): 41 min   Short Term Goals: Week 2:  PT Short Term Goal 1 (Week 2): STG = LTG due to estimated d/c date.  Skilled Therapeutic Interventions/Progress Updates:   Pt in supine, agreeable to therapy but requesting therapist return in 10-15 min as she was on [place] w/ [place]. Returned and pt agreeable, denies pain at rest but c/o pain in R foot and R shoulder intermittently w/ activity, resolves w/ rest breaks. Supervision bed mobility. Ambulated to therapy gym w/ supervision using RW and RUE orthosis. Pt needed no cues for RUE orthosis use and management. Pt even stopped to correct when hand slipped w/o prompting. 1 seated rest break in w/c 2/2 fatigue. Practiced negotiating curb w/ RW, as per home access, performed w/ CGA and no cues needed for technique/safety. Practiced car transfer w/ supervision via stand pivot and gait over uneven surface, also w/ supervision using RW. Pt feels ready to go home tomorrow she states. Performed NuStep 5 min x2 @ level 4 for global endurance and strengthening in decreased WB position 2/2 R callous pain. Returned to room total assist via w/c for time management, ended session in supine, all needs in reach.   Missed 19 min of skilled PT 2/2 refusal/unavailable.   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

## 2019-12-03 NOTE — Progress Notes (Signed)
Social Work Patient ID: Denise Leon, female   DOB: 10/19/1945, 73 y.o.   MRN: [id number]   Met with pt and daughter following their family ed sessions on Tuesday.  Both pleased with progress and report feeling ready for d/c end of week.  Reviewed HH and DME to be ordered.  Continue to follow.  , , LCSW  

## 2019-12-03 NOTE — Progress Notes (Signed)
Speech Language Pathology Discharge Summary  Patient Details  Name: Denise Leon MRN: [id number] Date of Birth: Jul 13, 1946  Today's Date: 12/03/2019 SLP Individual Time: 1100-1153 SLP Individual Time Calculation (min): 53 min   Skilled Therapeutic Interventions:  Skilled treatment session focused on cognitive goals. SLP facilitated session by providing Min verbal cues for problem solving during a 6-step sequencing task and for completion of a mildly complex money management task. Patient transferred to the recliner at end of session and left with alarm on and all needs within reach.   Patient has met 3 of 3 long term goals.  Patient to discharge at [facility] level.   Reasons goals not met: N/A   Clinical Impression/Discharge Summary: Patient has made functional gains and has met 3 of 3 LTGs this admission. Currently, patient is consuming regular textures with thin liquids without overt s/s of aspiration and is overall Mod I for use of swallowing compensatory strategies. Patient also requires overall Min A verbal cues for recall of functional information with use of external aids and complex problem solving. Patient and family education is complete and patient will discharge home with 24 hour supervision from family. Patient would benefit from f/u SLP services to maximize her cognitive functioning and overall functional independence in order to reduce caregiver burden.   Care Partner:  Caregiver Able to Provide Assistance: Yes  Type of Caregiver Assistance: Physical;Cognitive  Recommendation:  Home Health SLP;24 hour supervision/assistance  Rationale for SLP Follow Up: Maximize cognitive function and independence;Reduce caregiver burden   Equipment: N/A   Reasons for discharge: Discharged from hospital;Treatment goals met   Patient/Family Agrees with Progress Made and Goals Achieved: Yes    Brysin Towery 12/03/2019, 6:51 AM

## 2019-12-03 NOTE — Plan of Care (Signed)
  Problem: RH Balance Goal: LTG: Patient will maintain dynamic sitting balance (OT) Description: LTG:  Patient will maintain dynamic sitting balance with assistance during activities of daily living (OT) Outcome: Completed/Met Goal: LTG Patient will maintain dynamic standing with ADLs (OT) Description: LTG:  Patient will maintain dynamic standing balance with assist during activities of daily living (OT)  Outcome: Completed/Met   Problem: Sit to Stand Goal: LTG:  Patient will perform sit to stand in prep for activites of daily living with assistance level (OT) Description: LTG:  Patient will perform sit to stand in prep for activites of daily living with assistance level (OT) Outcome: Completed/Met   Problem: RH Eating Goal: LTG Patient will perform eating w/assist, cues/equip (OT) Description: LTG: Patient will perform eating with assist, with/without cues using equipment (OT) Outcome: Completed/Met   Problem: RH Grooming Goal: LTG Patient will perform grooming w/assist,cues/equip (OT) Description: LTG: Patient will perform grooming with assist, with/without cues using equipment (OT) Outcome: Completed/Met   Problem: RH Bathing Goal: LTG Patient will bathe all body parts with assist levels (OT) Description: LTG: Patient will bathe all body parts with assist levels (OT) Outcome: Completed/Met   Problem: RH Dressing Goal: LTG Patient will perform upper body dressing (OT) Description: LTG Patient will perform upper body dressing with assist, with/without cues (OT). Outcome: Completed/Met Goal: LTG Patient will perform lower body dressing w/assist (OT) Description: LTG: Patient will perform lower body dressing with assist, with/without cues in positioning using equipment (OT) Outcome: Completed/Met   Problem: RH Toileting Goal: LTG Patient will perform toileting task (3/3 steps) with assistance level (OT) Description: LTG: Patient will perform toileting task (3/3 steps) with  assistance level (OT)  Outcome: Completed/Met   Problem: RH Toilet Transfers Goal: LTG Patient will perform toilet transfers w/assist (OT) Description: LTG: Patient will perform toilet transfers with assist, with/without cues using equipment (OT) Outcome: Completed/Met   Problem: RH Tub/Shower Transfers Goal: LTG Patient will perform tub/shower transfers w/assist (OT) Description: LTG: Patient will perform tub/shower transfers with assist, with/without cues using equipment (OT) Outcome: Completed/Met

## 2019-12-04 NOTE — Progress Notes (Signed)
Patient is discharging today from Rehab Unit. Discharge instructions given to patient by PA- Jesusita Oka. Patient accompanied home by daughter. All belongings sent home with patient.

## 2019-12-04 NOTE — Progress Notes (Signed)
Social Work Discharge Note   The overall goal for the admission was met for:   Discharge location: Yes - returning home with daughter and son-in-law to provide 24/7 support  Length of Stay: Yes - 18 days  Discharge activity level: Yes- supervision/ CGA  Home/community participation: Yes  Services provided included: MD, RD, PT, OT, SLP, RN, TR, Pharmacy, Neuropsych and SW  Financial Services: Medicaid  Follow-up services arranged: Home Health: PT, OT, ST via [facility], DME: 16x16 hemi/lightweight w/c with right 1/2 lap tray, cushion, walker, commode and tub bench via [facility] and Patient/Family has no preference for HH/DME agencies  Comments (or additional information):    Contact - daughter, Khalil Belote @ [phone]  Patient/Family verbalized understanding of follow-up arrangements: Yes  Individual responsible for coordination of the follow-up plan: pt  Confirmed correct DME delivered: Lennart Pall 12/04/2019    Hadli Vandemark

## 2019-12-04 NOTE — Telephone Encounter (Signed)
[facility] notified that patient is still inpatient and we currently do not know who will be the assigned provider

## 2019-12-04 NOTE — Progress Notes (Signed)
[facility] PHYSICAL MEDICINE & REHABILITATION PROGRESS NOTE   Subjective/Complaints: Up in bed. Feeling well. Excited to go home  ROS: Patient denies fever, rash, sore throat, blurred vision, nausea, vomiting, diarrhea, cough, shortness of breath or chest pain, joint or back pain, headache, or mood change.   Objective:   No results found. No results for input(s): WBC, HGB, HCT, PLT in the last 72 hours. Recent Labs    12/02/19 0549  NA 139  K 4.5  CL 109  CO2 22  GLUCOSE 95  BUN 30*  CREATININE 1.45*  CALCIUM 8.8*    Intake/Output Summary (Last 24 hours) at 12/04/2019 0958 Last data filed at 12/04/2019 0730 Gross per 24 hour  Intake 460 ml  Output --  Net 460 ml     Physical Exam: Vital Signs Blood pressure 94/70, pulse 86, temperature 98.8 F (37.1 C), temperature source Oral, resp. rate (P) 20, height 5\' 2"  (1.575 m), weight 63.2 kg, SpO2 (P) 99 %. Constitutional: No distress . Vital signs reviewed. HEENT: EOMI, oral membranes moist Neck: supple Cardiovascular: RRR without murmur. No JVD    Respiratory: CTA Bilaterally without wheezes or rales. Normal effort    GI: BS +, non-tender, non-distended   Extremities: no edema Skin: No evidence of breakdown, no evidence of rash  MSK: minimal right shoiulder pain Neurologic: AOx3. Cranial nerves II through XII intact/ Motor 5/5 LUE and LLE. RUE 2- to 2+/5pec/deltoid,   biceps and  3-/5 wrist and finger flexors- . RLE:3/5 HF, KE and 4/5 ADF/PF. Senses pain and LT. No resting tone.  Psych:pleasant   Assessment/Plan: 1. 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  Physiatrist is providing close team supervision and 24 hour management of active medical problems listed below.  Physiatrist and rehab team continue to assess barriers to discharge/monitor patient progress toward functional and medical goals  Care  Tool:  Bathing  Bathing activity did not occur: Refused Body parts bathed by patient: Chest, Abdomen, Front perineal area, Right upper leg, Left upper leg, Right arm, Left arm, Buttocks, Right lower leg, Left lower leg, Face   Body parts bathed by helper: Right arm, Left arm, Buttocks, Left lower leg, Right lower leg     Bathing assist Assist Level: Supervision/Verbal cueing     Upper Body Dressing/Undressing Upper body dressing   What is the patient wearing?: Pull over shirt    Upper body assist Assist Level: Supervision/Verbal cueing    Lower Body Dressing/Undressing Lower body dressing      What is the patient wearing?: Underwear/pull up, Pants     Lower body assist Assist for lower body dressing: Supervision/Verbal cueing     Toileting Toileting    Toileting assist Assist for toileting: Supervision/Verbal cueing     Transfers Chair/bed transfer  Transfers assist     Chair/bed transfer assist level: Supervision/Verbal cueing     Locomotion Ambulation   Ambulation assist      Assist level: Supervision/Verbal cueing Assistive device: Walker-rolling Max distance: 100   Walk 10 feet activity   Assist  Walk 10 feet activity did not occur: Safety/medical concerns  Assist level: Supervision/Verbal cueing Assistive device: Walker-rolling   Walk 50 feet activity   Assist Walk 50 feet with 2 turns activity did not occur: Safety/medical concerns  Assist level: Supervision/Verbal cueing Assistive device: Walker-rolling    Walk 150 feet activity   Assist Walk 150 feet activity did  not occur: Safety/medical concerns  Assist level: Supervision/Verbal cueing Assistive device: Walker-rolling    Walk 10 feet on uneven surface  activity   Assist Walk 10 feet on uneven surfaces activity did not occur: Safety/medical concerns   Assist level: Supervision/Verbal cueing Assistive device: Aeronautical engineer Will patient  use wheelchair at discharge?: No Type of Wheelchair: Manual Wheelchair activity did not occur: N/A  Wheelchair assist level: Supervision/Verbal cueing Max wheelchair distance: 33'    Wheelchair 50 feet with 2 turns activity    Assist    Wheelchair 50 feet with 2 turns activity did not occur: N/A   Assist Level: Supervision/Verbal cueing   Wheelchair 150 feet activity     Assist  Wheelchair 150 feet activity did not occur: N/A       Blood pressure 94/70, pulse 86, temperature 98.8 F (37.1 C), temperature source Oral, resp. rate (P) 20, height 5\' 2"  (1.575 m), weight 63.2 kg, SpO2 (P) 99 %.  Medical Problem List and Plan: 1.Right side weakness with facial droopsecondary to patchy small infarct posterior left frontal region. Status post TPA. Plan 30 day event monitor. -dc home today  Patient to see Dr. Ranell Patrick in the office for transitional care encounter in 1-2 weeks.   -Continue CIR PT, OT, SLP 2. Antithrombotics: -DVT/anticoagulation:Lovenox. Lower extremity Dopplers negative -antiplatelet therapy: Aspirin 325 mg daily and Plavix 75 mg daily 3. Pain Management:Neurontin 400 mg twice daily, Lidoderm patch  -Ice prn to right medial arch  - continue above as well as cymbalta, votaren gel for foot/shoulder pain. ?neuropathic component.   2/19- pain remains well controlled.  4. Mood:Melatonin 3 mg nightly -antipsychotic agents: N/A  2/12: sleeping well at night.  5. Neuropsych: This patientiscapable of making decisions on herown behalf. 6. Skin/Wound Care:Routine skin checks 7. Fluids/Electrolytes/Nutrition:   Eating well off megace 8. Hypertension. Toprol-XL 25 mg daily. Monitor with increased mobility   Vitals:   12/04/19 0340 12/04/19 0909  BP: 132/86 94/70  Pulse: 81 86  Resp: 18 (P) 20  Temp: 98.8 F (37.1 C)   SpO2: 97% (P) 99%   2/19 BP under reasonable control ---avoid overtreatment 9. CKD  stage III.  ->Cr at 1.48 likely near baseline  2/15 C4 1.63   -pushing fluids  2/17: BMET personally reviewed, Cr improved to 1.45  2/18 continue to encourage fluids 10. Hyperlipidemia. Lipitor 11. Constipation:  Moving bowels regularly       LOS: 18 days A FACE TO FACE EVALUATION WAS PERFORMED  Denise Leon 12/04/2019, 9:58 AM

## 2019-12-04 NOTE — Plan of Care (Signed)
  Problem: Consults Goal: RH STROKE PATIENT EDUCATION Description: See Patient Education module for education specifics  Outcome: Completed/Met   Problem: RH BOWEL ELIMINATION Goal: RH STG MANAGE BOWEL WITH ASSISTANCE Description: STG Manage Bowel with Assistance. Min Outcome: Completed/Met Goal: RH STG MANAGE BOWEL W/MEDICATION W/ASSISTANCE Description: STG Manage Bowel with Medication with Assistance. Min Outcome: Completed/Met   Problem: RH BLADDER ELIMINATION Goal: RH STG MANAGE BLADDER WITH ASSISTANCE Description: STG Manage Bladder With Assistance. Min Outcome: Completed/Met   Problem: RH SKIN INTEGRITY Goal: RH STG SKIN FREE OF INFECTION/BREAKDOWN Description: Skin will remain free of breakdown and infection while on rehab with min assist Outcome: Completed/Met Goal: RH STG MAINTAIN SKIN INTEGRITY WITH ASSISTANCE Description: STG Maintain Skin Integrity With Assistance. Min Outcome: Completed/Met   Problem: RH SAFETY Goal: RH STG ADHERE TO SAFETY PRECAUTIONS W/ASSISTANCE/DEVICE Description: STG Adhere to Safety Precautions With Assistance/Device. Mod I Outcome: Completed/Met   Problem: RH PAIN MANAGEMENT Goal: RH STG PAIN MANAGED AT OR BELOW PT'S PAIN GOAL Description: Less than 4 Outcome: Completed/Met   Problem: RH KNOWLEDGE DEFICIT Goal: RH STG INCREASE KNOWLEDGE OF DYSPHAGIA/FLUID INTAKE Description: Patient/caregiver will be able to verbalize safe swallowing for patient Outcome: Completed/Met Goal: RH STG INCREASE KNOWLEDGE OF STROKE PROPHYLAXIS Description: Patient/caregiver will be able to describe medicines to prevent stroke and how to take them, when to report to MD with cues/handouts Outcome: Completed/Met

## 2019-12-07 ENCOUNTER — Telehealth: Payer: Self-pay

## 2019-12-07 ENCOUNTER — Telehealth: Payer: Self-pay | Admitting: *Deleted

## 2019-12-07 NOTE — Telephone Encounter (Signed)
Received incoming cal from patients daughter Flordeluna Dresch-Gotting.  Transitional care call completed  Appointment changed to 9:20 am on the same day to accommodate family schedule  Address confirmed, new appointment packet mailed   Transitional Care Questions   1. Are you/is patient experiencing any problems since coming home? No, the family was confused  About how to handle the discharge medications. Some of the patients medications were changed and family uncertain if they are to go back original medication list or stay with discharge medications. Are there any questions regarding any aspect of care?  No  2. Are there any questions regarding medications administration/dosing? No  Are meds being taken as prescribed? Yes Patient should review meds with caller to confirm   3. Have there been any falls?  No  4. Has Home Health been to the house and/or have they contacted you? No If not, have you tried to contact them? Can we help you contact them?   5. Are bowels and bladder emptying properly? Yes  Are there any unexpected incontinence issues? No  If applicable, is patient following bowel/bladder programs?   6. Any fevers, problems with breathing, unexpected pain? No  7. Are there any skin problems or new areas of breakdown?  No  8. Has the patient/family member arranged specialty MD follow up (ie cardiology/neurology/renal/surgical/etc)? Yes  Can we help arrange?   9. Does the patient need any other services or support that we can help arrange?  No  10. Are caregivers following through as expected in assisting the patient?  Yes  11. Has the patient quit smoking, drinking alcohol, or using drugs as recommended?  Patient does not do these things

## 2019-12-07 NOTE — Telephone Encounter (Signed)
Called daughter of patient and left detailed message about appt. Asked for daughter to call back. Daughter did cal back but I was on another call. Called daughter back and asked her to call back.

## 2019-12-16 ENCOUNTER — Other Ambulatory Visit: Payer: Self-pay

## 2019-12-16 ENCOUNTER — Encounter
Payer: Medicaid Other | Attending: Physical Medicine and Rehabilitation | Admitting: Physical Medicine and Rehabilitation

## 2019-12-16 ENCOUNTER — Encounter: Payer: Self-pay | Admitting: Physical Medicine and Rehabilitation

## 2019-12-16 ENCOUNTER — Other Ambulatory Visit: Payer: Self-pay | Admitting: Physical Medicine and Rehabilitation

## 2019-12-16 VITALS — BP 178/101 | HR 65 | Temp 98.1°F | Ht 60.25 in | Wt 141.2 lb

## 2019-12-16 DIAGNOSIS — R400 Somnolence: Secondary | ICD-10-CM | POA: Insufficient documentation

## 2019-12-16 DIAGNOSIS — I10 Essential (primary) hypertension: Secondary | ICD-10-CM | POA: Insufficient documentation

## 2019-12-16 DIAGNOSIS — R200 Anesthesia of skin: Secondary | ICD-10-CM | POA: Insufficient documentation

## 2019-12-16 DIAGNOSIS — I63512 Cerebral infarction due to unspecified occlusion or stenosis of left middle cerebral artery: Secondary | ICD-10-CM | POA: Insufficient documentation

## 2019-12-16 MED ORDER — AMLODIPINE BESYLATE 5 MG PO TABS: 5.0000 mg | ORAL_TABLET | Freq: Every day | ORAL | 1 refills | Status: DC

## 2019-12-16 NOTE — Progress Notes (Signed)
Subjective:    Patient ID: Denise Leon, female    DOB: [birth year], 74 y.o.   MRN: [id number]  HPI   Denise Leon is a 74 y.o. right-handed female with history of asthma, CKD stage III, hip and neck surgery as well as hypertension.  Per chart review lives with daughter.  Up until October patient had been living alone in [place].  Independent mobility prior to admission.  Daughter did assist with some basic ADLs.  Presented 11/14/2019 with headache right side weakness and facial droop.  Cranial CT scan showed question subtle loss of gray-white matter differentiation involving the posterior left frontal region suspicious for early developing acute left MCA territory ischemia.  Patient did receive TPA.  Admission chemistries BUN 28, creatinine 1.49, SARS coronavirus negative, urinalysis negative nitrite.  CT angiogram of head and neck negative for large vessel occlusion.  There was an incidental finding of a 19 mm left thyroid nodule advised to follow-up outpatient.  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  Echocardiogram with ejection fraction of 60% without emboli.  Neurology follow-up maintained on aspirin 325 mg daily and Plavix 75 mg daily.  Lovenox for DVT prophylaxis.  Patient was admitted for a comprehensive rehab program.   Hospital Course: Daleena Rotter was admitted to rehab 11/16/2019 for inpatient therapies to consist of PT, ST and OT at least three hours five days a week. Past admission physiatrist, therapy team and rehab RN have worked together to provide customized collaborative inpatient rehab.  Pertaining to patient's patchy small infarction posterior left frontal region status post TPA.  Remains on aspirin and Plavix therapy with follow-up outpatient neurology services.  Lovenox for DVT prophylaxis venous Doppler studies negative.  Blood pressure control with Toprol-XL no  orthostasis follow-up outpatient PCP.  Melatonin for insomnia with good results.  Pain managed with use of Neurontin scheduled twice daily as well as a Lidoderm patch.  Continued as well with Cymbalta and Voltaren gel questionable neuropathic component.  CKD stage III creatinine 1.48 likely near baseline.  Again patient follow-up outpatient with PCP.  Lipitor ongoing for hyperlipidemia.   Blood pressures were monitored and remained controlled  SHe/ is continent of bowel and bladder.  SHe/ has made gains during rehab stay and is attending therapies  SHe/ will continue to receive follow up therapies   after discharge  Rehab course: During patient's stay in rehab weekly team conferences were held to monitor patient's progress, set goals and discuss barriers to discharge. At admission, patient required +2 physical assist sit to stand, moderate assist supine to sit, moderate assist ambulation, minimal assist for feeding moderate assist upper body bathing moderate assist lower body bathing moderate assist upper body dressing max assist lower body dressing moderate assist toilet transfers.  Denise Leon presents for transitional care follow-up after CIR admission. Her daughter accompanies her.   She has been receiving home PT and OT and her mobility continues to improve.   Her BP is elevated in the office today and her daughter has been checking her BP at home and it is usually elevated there as well.   She denies pain but continues on gabapentin 400mg  BID. She does feel drowsy during day and daughter says she often sleeps during the day.   All medications reviewed and she is taking as prescribed. Her daughter asks whether they should all be taken in the morning.   Pain Inventory  Average Pain 6 Pain Right Now 1 My pain is burning, dull and aching  In the last 24 hours, has pain interfered with the following? General activity 5 Relation with others 7 Enjoyment of life 6 What TIME of day  is your pain at its worst? daytime Sleep (in general) Good  Pain is worse with: some activites Pain improves with: rest and medication Relief from Meds: 5  Mobility walk with assistance use a walker how many minutes can you walk? less than a 1 ability to climb steps?  no do you drive?  no  Function not employed: date last employed . disabled: date disabled . I need assistance with the following:  dressing, bathing, meal prep, household duties and shopping  Neuro/Psych weakness numbness trouble walking  Prior Studies CT/MRI  Physicians involved in your care Primary care .   Family History  Problem Relation Age of Onset  . Lung disease Mother    Social History   Socioeconomic History  . Marital status: Widowed    Spouse name: Not on file  . Number of children: Not on file  . Years of education: Not on file  . Highest education level: Not on file  Occupational History  . Not on file  Tobacco Use  . Smoking status: Never Smoker  . Smokeless tobacco: Never Used  Substance and Sexual Activity  . Alcohol use: Never  . Drug use: Never  . Sexual activity: Not Currently  Other Topics Concern  . Not on file  Social History Narrative  . Not on file   Social Determinants of Health   Financial Resource Strain:   . Difficulty of Paying Living Expenses: Not on file  Food Insecurity:   . Worried About Programme researcher, broadcasting/film/video in the Last Year: Not on file  . Ran Out of Food in the Last Year: Not on file  Transportation Needs:   . Lack of Transportation (Medical): Not on file  . Lack of Transportation (Non-Medical): Not on file  Physical Activity:   . Days of Exercise per Week: Not on file  . Minutes of Exercise per Session: Not on file  Stress:   . Feeling of Stress : Not on file  Social Connections:   . Frequency of Communication with Friends and Family: Not on file  . Frequency of Social Gatherings with Friends and Family: Not on file  . Attends Religious  Services: Not on file  . Active Member of Clubs or Organizations: Not on file  . Attends Banker Meetings: Not on file  . Marital Status: Not on file   Past Surgical History:  Procedure Laterality Date  . HIP SURGERY    . NECK SURGERY     Past Medical History:  Diagnosis Date  . Asthma   . Hypertension    BP (!) 178/101   Pulse 65   Temp 98.1 F (36.7 C)   Ht 5' 0.25" (1.53 m)   Wt 141 lb 3.2 oz (64 kg)   SpO2 97%   BMI 27.35 kg/m   Opioid Risk Score:   Fall Risk Score:  `1  Depression screen PHQ 2/9  Depression screen PHQ 2/9 12/16/2019  Decreased Interest 0  Down, Depressed, Hopeless 0  PHQ - 2 Score 0  Altered sleeping 0  Tired, decreased energy 0  Feeling bad or failure about yourself  0  Trouble concentrating 0  Moving slowly or fidgety/restless 0  Suicidal thoughts 0  PHQ-9 Score 0  Review of Systems  Musculoskeletal: Positive for gait problem.  Neurological: Positive for weakness and numbness.  All other systems reviewed and are negative.      Objective:   Physical Exam Neurological.  Alert no acute distress makes good eye contact follows commands provides name age and date of birth.  She does appear to have some functional expressive receptive language deficits General.  No acute distress mood and affect appropriate Neck.  Supple nontender no JVD without thyromegaly Cardiac regular rate rhythm without any extra sounds or murmur heard Respiratory effort normal no respiratory distress without wheeze GI.  Soft nontender positive bowel sounds without rebound Neurological muscular Skeletal.  Cranial nerves II through XII intact motor strength 5 out of 5 left deltoid bicep tricep grip hip flexors knee extensors ankle dorsi flexion and plantar flexion.  3/5 strength throughout right side- much improved!     Assessment & Plan:  Medical Problem List and Plan: 1.Right side weakness with facial droopsecondary to patchy small infarct  posterior left frontal region. Status post TPA. Plan 30 day event monitor.             -Continue Home PT, OT, SLP- she is progressing well!  2. Antiplatelet therapy: Aspirin 325 mg daily and Plavix 75 mg daily. Daughter asked if these can be taken at the same time and I confirmed they could be.   3. Pain Management:Neurontin 400 mg twice daily currently prescribed but patient denies pain. Advised decreasing to 400mg  HS as patient is oversedated during the day. Eventually can titrate off if no longer needed.   4. Mood:Continue Cymbalta for depressed mood. Does not want to increase dose at this time.   5. HTN: BP is uncontrolled in office and at home reads. Restart home amlodipine 5mg  daily. She has PCP but no scheduled follow-up. Advised that she schedule follow-up.  RTC in 2 weeks for BP check and medication adjustments as necessary/titration of Gabapentin as necessary/management of rehabilitation program.

## 2019-12-28 ENCOUNTER — Ambulatory Visit: Payer: Medicaid Other | Admitting: Student

## 2019-12-29 NOTE — Progress Notes (Signed)
PCP:  Patient, No Pcp Per Electrophysiologist: Dr. Glade Lloyd is a 74 y.o. female with past medical history of cryptogenic stroke who presents today for routine electrophysiology followup. They are seen for Dr. Caryl Comes.   Since discharge from her stroke care, patient reports doing very well.  She is working with PT and has regained most of the strength in her R side.  We have not yet received the Zio patch, and per records it has not been sent back. Pt and daughter state they weren't given instructions to return it, and threw away the packaging.   Pt is from the [place] but speaks and understands [place], having lived in [place] 10 years prior to moving to [facility]. She does have a long history of palpitations. Previously on metoprolol for this.   The patient feels that she is tolerating medications without difficulties and is otherwise without complaint today.   Past Medical History:  Diagnosis Date  . Asthma   . Hypertension    Past Surgical History:  Procedure Laterality Date  . HIP SURGERY    . NECK SURGERY      Current Outpatient Medications  Medication Sig Dispense Refill  . acetaminophen (TYLENOL) 325 MG tablet Take 2 tablets (650 mg total) by mouth every 4 (four) hours as needed for mild pain (or temp > 37.5 C (99.5 F)).    Marland Kitchen amLODipine (NORVASC) 5 MG tablet TAKE 1 TABLET(5 MG) BY MOUTH DAILY 90 tablet 1  . aspirin EC 325 MG EC tablet Take 1 tablet (325 mg total) by mouth daily. 30 tablet 0  . atorvastatin (LIPITOR) 80 MG tablet Take 1 tablet (80 mg total) by mouth daily. 30 tablet 0  . clopidogrel (PLAVIX) 75 MG tablet Take 1 tablet (75 mg total) by mouth daily. 30 tablet 0  . diclofenac Sodium (VOLTAREN) 1 % GEL Apply 2 g topically 3 (three) times daily. 2 g 0  . docusate sodium (COLACE) 100 MG capsule Take 1 capsule (100 mg total) by mouth 2 (two) times daily. 10 capsule 0  . DULoxetine (CYMBALTA) 30 MG capsule Take 1 capsule (30 mg total) by mouth daily. 30  capsule 3  . gabapentin (NEURONTIN) 400 MG capsule Take 1 capsule (400 mg total) by mouth 2 (two) times daily. 60 capsule 1  . latanoprost (XALATAN) 0.005 % ophthalmic solution Place 1 drop into both eyes at bedtime. 2.5 mL 12  . lidocaine (LIDODERM) 5 % Place 2 patches onto the skin daily. Remove & Discard patch within 12 hours or as directed by MD 30 patch 0  . Multiple Vitamin (MULTIVITAMIN WITH MINERALS) TABS tablet Take 1 tablet by mouth daily.    . pantoprazole (PROTONIX) 40 MG tablet Take 1 tablet (40 mg total) by mouth daily. 30 tablet 0  . carvedilol (COREG) 6.25 MG tablet Take 1 tablet (6.25 mg total) by mouth 2 (two) times daily. 180 tablet 3   No current facility-administered medications for this visit.    No Known Allergies  Social History   Socioeconomic History  . Marital status: Widowed    Spouse name: Not on file  . Number of children: Not on file  . Years of education: Not on file  . Highest education level: Not on file  Occupational History  . Not on file  Tobacco Use  . Smoking status: Never Smoker  . Smokeless tobacco: Never Used  Substance and Sexual Activity  . Alcohol use: Never  . Drug use: Never  .  Sexual activity: Not Currently  Other Topics Concern  . Not on file  Social History Narrative  . Not on file   Social Determinants of Health   Financial Resource Strain:   . Difficulty of Paying Living Expenses:   Food Insecurity:   . Worried About Programme researcher, broadcasting/film/video in the Last Year:   . Barista in the Last Year:   Transportation Needs:   . Freight forwarder (Medical):   Marland Kitchen Lack of Transportation (Non-Medical):   Physical Activity:   . Days of Exercise per Week:   . Minutes of Exercise per Session:   Stress:   . Feeling of Stress :   Social Connections:   . Frequency of Communication with Friends and Family:   . Frequency of Social Gatherings with Friends and Family:   . Attends Religious Services:   . Active Member of Clubs or  Organizations:   . Attends Banker Meetings:   Marland Kitchen Marital Status:   Intimate Partner Violence:   . Fear of Current or Ex-Partner:   . Emotionally Abused:   Marland Kitchen Physically Abused:   . Sexually Abused:      Review of Systems: General: No chills, fever, night sweats or weight changes  Cardiovascular:  No chest pain, dyspnea on exertion, edema, orthopnea, palpitations, paroxysmal nocturnal dyspnea Dermatological: No rash, lesions or masses Respiratory: No cough, dyspnea Urologic: No hematuria, dysuria Abdominal: No nausea, vomiting, diarrhea, bright red blood per rectum, melena, or hematemesis Neurologic: No visual changes, weakness, changes in mental status All other systems reviewed and are otherwise negative except as noted above.  Physical Exam: Vitals:   12/30/19 0858  BP: 136/64  Pulse: 89  SpO2: 96%  Weight: 137 lb (62.1 kg)  Height: 5' 0.25" (1.53 m)    GEN- The patient is well appearing, alert and oriented x 3 today.   HEENT: normocephalic, atraumatic; sclera clear, conjunctiva pink; hearing intact; oropharynx clear; neck supple, no JVP Lymph- no cervical lymphadenopathy Lungs- Clear to ausculation bilaterally, normal work of breathing.  No wheezes, rales, rhonchi Heart- Regular rate and rhythm, no murmurs, rubs or gallops, PMI not laterally displaced GI- soft, non-tender, non-distended, bowel sounds present, no hepatosplenomegaly Extremities- no clubbing, cyanosis, or edema; DP/PT/radial pulses 2+ bilaterally MS- no significant deformity or atrophy Skin- warm and dry, no rash or lesion Psych- euthymic mood, full affect Neuro- strength and sensation are intact  EKG is not ordered.     Assessment and Plan:  1. Cryptogenic stroke Pt has worn his event monitor, unfortunately results are not available to review today. Pt states she was not given instructions to return. But still has the equipment at home. (She does not have "packaging" or a "box".  Wear  summary available via [employer] shows no AF observed, and personally reviewed strips show sinus rhythm/sinus bradyardia.  DVT US 11/16/2019 negative Echo 11/14/2019 LVEF 55-60%, Grade 1 DD, normal RAE/LAE. Discussed risks and benefits of loop recorder implantation with patient today. She and daughter verbalize understanding and willingness to proceed. We will likely plan to proceed pending upload of final report. Will reach out to Dr. Odessa Fleming RN.   2. HTN Change Toprol to Coreg 6.25 mg BID for better BP control. Titrate prn.   Graciella Freer, PA-C  12/30/19 10:20 AM

## 2019-12-30 ENCOUNTER — Ambulatory Visit (INDEPENDENT_AMBULATORY_CARE_PROVIDER_SITE_OTHER): Payer: Medicare Other | Admitting: Student

## 2019-12-30 ENCOUNTER — Other Ambulatory Visit: Payer: Self-pay

## 2019-12-30 ENCOUNTER — Encounter: Payer: Self-pay | Admitting: Student

## 2019-12-30 VITALS — BP 136/64 | HR 89 | Ht 60.25 in | Wt 137.0 lb

## 2019-12-30 DIAGNOSIS — I639 Cerebral infarction, unspecified: Secondary | ICD-10-CM

## 2019-12-30 MED ORDER — CARVEDILOL 6.25 MG PO TABS: 6.2500 mg | ORAL_TABLET | Freq: Two times a day (BID) | ORAL | 3 refills | Status: DC

## 2019-12-30 NOTE — Patient Instructions (Addendum)
Medication Instructions:  STOP METOPROLOL  START CARVEDILOL 6.25 mg TWICE DAILY *If you need a refill on your cardiac medications before your next appointment, please call your pharmacy*   Lab Work: none If you have labs (blood work) drawn today and your tests are completely normal, you will receive your results only by: Marland Kitchen MyChart Message (if you have MyChart) OR . A paper copy in the mail If you have any lab test that is abnormal or we need to change your treatment, we will call you to review the results.   Testing/Procedures:  DONE IN OFFICE BY Dr Graciela Husbands, SOMEONE WILL CALL YOU...  LOOP RECORDER MONITOR    Follow-Up: At [facility], you and your health needs are our priority.  As part of our continuing mission to provide you with exceptional heart care, we have created designated Provider Care Teams.  These Care Teams include your primary Cardiologist (physician) and Advanced Practice Providers (APPs -  Physician Assistants and Nurse Practitioners) who all work together to provide you with the care you need, when you need it.  We recommend signing up for the patient portal called "MyChart".  Sign up information is provided on this After Visit Summary.  MyChart is used to connect with patients for Virtual Visits (Telemedicine).  Patients are able to view lab/test results, encounter notes, upcoming appointments, etc.  Non-urgent messages can be sent to your provider as well.   To learn more about what you can do with MyChart, go to [email or web address].     Other Instructions

## 2020-01-06 ENCOUNTER — Encounter: Payer: Medicaid Other | Admitting: Physical Medicine and Rehabilitation

## 2020-01-07 ENCOUNTER — Encounter: Payer: Self-pay | Admitting: Physical Medicine and Rehabilitation

## 2020-01-07 ENCOUNTER — Encounter: Payer: Medicaid Other | Admitting: Physical Medicine and Rehabilitation

## 2020-01-07 ENCOUNTER — Other Ambulatory Visit: Payer: Self-pay

## 2020-01-07 VITALS — BP 151/85 | HR 69 | Temp 97.3°F | Ht 62.0 in | Wt 140.0 lb

## 2020-01-07 DIAGNOSIS — I10 Essential (primary) hypertension: Secondary | ICD-10-CM | POA: Diagnosis not present

## 2020-01-07 DIAGNOSIS — I63512 Cerebral infarction due to unspecified occlusion or stenosis of left middle cerebral artery: Secondary | ICD-10-CM | POA: Diagnosis not present

## 2020-01-07 DIAGNOSIS — R200 Anesthesia of skin: Secondary | ICD-10-CM | POA: Diagnosis not present

## 2020-01-07 MED ORDER — CLOPIDOGREL BISULFATE 75 MG PO TABS: 75.0000 mg | ORAL_TABLET | Freq: Every day | ORAL | 0 refills | Status: DC

## 2020-01-07 MED ORDER — EUCERIN EX CREA: TOPICAL_CREAM | CUTANEOUS | 0 refills | Status: DC | PRN

## 2020-01-07 NOTE — Progress Notes (Signed)
Subjective:    Patient ID: Denise Leon, female    DOB: [birth year], 74 y.o.   MRN: [id number]  HPI  Denise Leon a 74 y.o.right-handed femalewith history of asthma, CKD stage III, hip and neck surgery as well as hypertension, who presents for follow-up of her impaired mobility and ADLs following left MCA stroke. Her daughter again accompanies her today.   She has been progressing very well with home therapies. She has been ambulating independently in her home and to her physician appointments with use of the RW. Denies falls.   She continues to deny any pain and has experienced improved energy after stopping taking Gabapentin during the day. She continues to take 400mg  at night but does not have any pain or insomnia; her daughter says that she just has numbness in her bilateral legs at night. She currently has 300mg  and 100mg  tablets of the Gabapentin. She states that in she was supposed to have back surgery but that she is too scared to have surgery at this time.   BP has been much better controlled with the Amlodipine. She takes the 5mg  daily prescribed at the last visit. Denies any swelling in her legs. Her reads at home are 130s/80s. Today in office it is 151 systolic but she has not yet taken her BP medication this morning.   She has established PCP follow-up and her appointment is on Monday. She needs a refill of Plavix as she has run out.   Pain Inventory Average Pain 0 Pain Right Now 0 My pain is no pain  In the last 24 hours, has pain interfered with the following? General activity 0 Relation with others 0 Enjoyment of life 0 What TIME of day is your pain at its worst? no pain Sleep (in general) Good  Pain is worse with: no pain Pain improves with: no pain Relief from Meds: no pain  Mobility walk with assistance use a walker Do you have any goals in this area?  no  Function not employed: date last employed . I need assistance with the following:   meal prep, household duties and shopping Do you have any goals in this area?  no  Neuro/Psych trouble walking  Prior Studies Any changes since last visit?  no  Physicians involved in your care Any changes since last visit?  no   Family History  Problem Relation Age of Onset  . Lung disease Mother    Social History   Socioeconomic History  . Marital status: Widowed    Spouse name: Not on file  . Number of children: Not on file  . Years of education: Not on file  . Highest education level: Not on file  Occupational History  . Not on file  Tobacco Use  . Smoking status: Never Smoker  . Smokeless tobacco: Never Used  Substance and Sexual Activity  . Alcohol use: Never  . Drug use: Never  . Sexual activity: Not Currently  Other Topics Concern  . Not on file  Social History Narrative  . Not on file   Social Determinants of Health   Financial Resource Strain:   . Difficulty of Paying Living Expenses:   Food Insecurity:   . Worried About in the Last Year:   . [place] in the Last Year:   Transportation Needs:   . (Medical):   Monday Lack of Transportation (Non-Medical):   Physical Activity:   . Days of Exercise  per Week:   . Minutes of Exercise per Session:   Stress:   . Feeling of Stress :   Social Connections:   . Frequency of Communication with Friends and Family:   . Frequency of Social Gatherings with Friends and Family:   . Attends Religious Services:   . Active Member of Clubs or Organizations:   . Attends Banker Meetings:   Marland Kitchen Marital Status:    Past Surgical History:  Procedure Laterality Date  . HIP SURGERY    . NECK SURGERY     Past Medical History:  Diagnosis Date  . Asthma   . Hypertension    Temp (!) 97.3 F (36.3 C)   Ht 5\' 2"  (1.575 m)   Wt 140 lb (63.5 kg)   BMI 25.61 kg/m   Opioid Risk Score:   Fall Risk Score:  `1  Depression screen PHQ 2/9  Depression screen PHQ  2/9 12/16/2019  Decreased Interest 0  Down, Depressed, Hopeless 0  PHQ - 2 Score 0  Altered sleeping 0  Tired, decreased energy 0  Feeling bad or failure about yourself  0  Trouble concentrating 0  Moving slowly or fidgety/restless 0  Suicidal thoughts 0  PHQ-9 Score 0    Review of Systems  Constitutional: Negative.   HENT: Negative.   Eyes: Negative.   Respiratory: Negative.   Cardiovascular: Negative.   Gastrointestinal: Negative.   Endocrine: Negative.   Genitourinary: Negative.   Musculoskeletal: Positive for gait problem.  Skin: Negative.   Allergic/Immunologic: Negative.   Hematological: Negative.   Psychiatric/Behavioral: Negative.   All other systems reviewed and are negative.      Objective:   Physical Exam General. No acute distress mood and affect appropriate.  Neck. Supple nontender no JVD without thyromegaly Cardiac regular rate rhythm without any extra sounds or murmur heard Respiratory effort normal no respiratory distress without wheeze GI. Soft nontender positive bowel sounds without rebound Neurological muscular Skeletal. Cranial nerves II through XII intact motor strength 5 out of 5 left deltoid bicep tricep grip hip flexors knee extensors ankle dorsi flexion and plantar flexion. 4/5 strength throughout right side- much improved! Ambulating well with RW modI Psych: Normal mood and affect.  Skin: dry, no edema.      Assessment & Plan:  1.Right side weakness with facial droopsecondary to patchy small infarct posterior left frontal region. Status post TPA. Plan 30 day event monitor. -Continue Home PT, OT, SLP- she is progressing well!  2. Antiplatelet therapy: Aspirin 325 mg daily and Plavix 75 mg daily. Daughter asked if these can be taken at the same time and I confirmed they could be. Provided refill of Plavix today.   3. Pain Management:Neurontin 400 mg twice daily currently prescribed but patient denies pain. Last visit  advised decreasing to 400mg  HS as patient is oversedated during the day. Tolerating this decrease well. Recommend decreasing further at night to 300mg  for 1 week, 100mg  the following week, and then stopping.   4. Mood:Continue Cymbalta for depressed mood. Has been in good spirits.   5. HTN: BP much better controlled at home at goal of 130s/80s. Continue amlodipine 5mg  daily; no evidence of swelling.  Has PCP follow-up scheduled for Monday.   6. Dry skin: Prescribed Eucerin cream.   7. Numbness and tingling in bilateral lower extremities. Should follow-up with PCP regarding diabetes. Has no back pain and weakness is due to stroke. Does not want or need back surgery at this time.  RTC PRN. All questions answered. Advised to call if develops worsening symptoms in future, but she is progressing very well!

## 2020-01-18 ENCOUNTER — Ambulatory Visit: Payer: Medicaid Other | Admitting: Internal Medicine

## 2020-01-18 ENCOUNTER — Encounter: Payer: Self-pay | Admitting: Internal Medicine

## 2020-01-18 ENCOUNTER — Other Ambulatory Visit: Payer: Self-pay

## 2020-01-18 VITALS — BP 112/80 | HR 95 | Ht 62.0 in | Wt 137.4 lb

## 2020-01-18 DIAGNOSIS — R002 Palpitations: Secondary | ICD-10-CM

## 2020-01-18 DIAGNOSIS — I63512 Cerebral infarction due to unspecified occlusion or stenosis of left middle cerebral artery: Secondary | ICD-10-CM | POA: Diagnosis not present

## 2020-01-18 NOTE — Progress Notes (Signed)
Patient Care Team: Patient, No Pcp Per as PCP - General (General Practice)   HPI  Denise Leon is a 74 y.o. 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  She was monitored following discharge with the event recorder because of high-grade baseline ectopy failing to demonstrate atrial fibrillation.  She is now here for implantation of a loop recorder.  2/21 echocardiogram EF 55-60%   Risk factors include hypertension gender and age  61 and Results Reviewed   Past Medical History:  Diagnosis Date  . Acute ischemic stroke (HCC) patchy L frontal infarcts s/p tPA 11/14/2019  . Asthma   . CKD (chronic kidney disease), stage IIIb 11/16/2019  . Hyperlipidemia 11/16/2019  . Hypertension   . 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 Palpitations 11/16/2019  . Stroke ([place]) 11/14/2019  . Thyroid nodule, L 11/16/2019    Past Surgical History:  Procedure Laterality Date  . HIP SURGERY    . NECK SURGERY      Current Meds  Medication Sig  . acetaminophen (TYLENOL) 325 MG tablet Take 2 tablets (650 mg total) by mouth every 4 (four) hours as needed for mild pain (or temp > 37.5 C (99.5 F)).  Marland Kitchen amLODipine (NORVASC) 5 MG tablet TAKE 1 TABLET(5 MG) BY MOUTH DAILY  . aspirin EC 325 MG EC tablet Take 1 tablet (325 mg total) by mouth daily.  Marland Kitchen atorvastatin (LIPITOR) 80 MG tablet Take 1 tablet (80 mg total) by mouth daily.  . carvedilol (COREG) 6.25 MG tablet Take 1 tablet (6.25 mg total) by mouth 2 (two) times daily.  . clopidogrel (PLAVIX) 75 MG tablet Take 1 tablet (75 mg total) by mouth daily.  . diclofenac Sodium (VOLTAREN) 1 % GEL Apply 2 g topically 3 (three) times daily.  Marland Kitchen docusate sodium (COLACE) 100 MG capsule Take 1 capsule (100 mg total) by mouth 2 (two) times daily.  . DULoxetine (CYMBALTA) 30 MG capsule Take 1 capsule (30 mg total) by mouth daily.  Marland Kitchen gabapentin (NEURONTIN) 400 MG capsule Take 1 capsule (400 mg total) by  mouth 2 (two) times daily.  Marland Kitchen latanoprost (XALATAN) 0.005 % ophthalmic solution Place 1 drop into both eyes at bedtime.  . lidocaine (LIDODERM) 5 % Place 2 patches onto the skin daily. Remove & Discard patch within 12 hours or as directed by MD  . Multiple Vitamin (MULTIVITAMIN WITH MINERALS) TABS tablet Take 1 tablet by mouth daily.  . pantoprazole (PROTONIX) 40 MG tablet Take 1 tablet (40 mg total) by mouth daily.  . Skin Protectants, Misc. (EUCERIN) cream Apply topically as needed for dry skin.    No Known Allergies    Review of Systems negative except from HPI and PMH  Physical Exam BP 112/80   Pulse 95   Ht 5\' 2"  (1.575 m)   Wt 137 lb 6.4 oz (62.3 kg)   SpO2 98%   BMI 25.13 kg/m  Well developed and well nourished in no acute distress HENT normal E scleral and icterus clear Neck Supple JVP flat; carotids brisk and full Clear to ausculation  *Regular rate and rhythm, no murmurs gallops or rub Soft with active bowel sounds No clubbing cyanosis  Edema Alert and oriented, grossly normal motor and sensory function Skin Warm and Dry   CrCl cannot be calculated (Patient's most recent lab result is older than the maximum 21 days allowed.).   Assessment and  Plan Cryptogenic stroke  Pre op Dx Cryptogenic Stroke  Post op Dx Same  Procedure  Loop Recorder implantation  After routine prep and drape of the left parasternal area, a small incision was created. A Medtronic LINQ Reveal Loop Recorder  Serial Number  [id number] was inserted.    SteriStrip dressing was  applied.  The patient tolerated the procedure without apparent complication.  EBL < 10cc       Current medicines are reviewed at length with the patient today .  The patient does not  have concerns regarding medicines.

## 2020-01-18 NOTE — Patient Instructions (Addendum)
Medication Instructions:  Your physician recommends that you continue on your current medications as directed. Please refer to the Current Medication list given to you today.  Labwork: None ordered.  Testing/Procedures: Loop Implant  Follow-Up: Next appointment will be a virtual visit.  01/28/2020 at 4pm to check the wound.  Remote monitoring is used to monitor your Linq implant at home. This monitoring reduces the number of office visits required to check your device to one time per year. It allows [place] to keep an eye on the functioning of your device to ensure it is working properly.   Any Other Special Instructions Will Be Listed Below (If Applicable).  You may shower tomorrow 01/19/2020 with the bandage on.  You may remove the large bandage on Friday 01/22/2020.  Please allow the steri-strips to come off on their own.  Wound Care, Adult Taking care of your wound properly can help to prevent pain, infection, and scarring. It can also help your wound to heal more quickly. How to care for your wound Wound care      Follow instructions from your health care provider about how to take care of your wound. Make sure you: ? Wash your hands with soap and water before you change the bandage (dressing). If soap and water are not available, use hand sanitizer. ? Change your dressing as told by your health care provider. ? Leave stitches (sutures), skin glue, or adhesive strips in place. These skin closures may need to stay in place for 2 weeks or longer. If adhesive strip edges start to loosen and curl up, you may trim the loose edges. Do not remove adhesive strips completely unless your health care provider tells you to do that.  Check your wound area every day for signs of infection. Check for: ? Redness, swelling, or pain. ? Fluid or blood. ? Warmth. ? Pus or a bad smell.  Ask your health care provider if you should clean the wound with mild soap and water. Doing this may  include: ? Using a clean towel to pat the wound dry after cleaning it. Do not rub or scrub the wound.  ? Covering the incision with a clean dressing.  Ask your health care provider when you can leave the wound uncovered.  Keep the dressing dry until your health care provider says it can be removed. Do not take baths, swim, use a hot tub, or do anything that would put the wound underwater until your health care provider approves. Ask your health care provider if you can take showers. You may only be allowed to take sponge baths. Medicines   If you were prescribed an antibiotic medicine, cream, or ointment, take or use the antibiotic as told by your health care provider. Do not stop taking or using the antibiotic even if your condition improves.  Take over-the-counter and prescription medicines only as told by your health care provider. If you were prescribed pain medicine, take it 30 or more minutes before you do any wound care or as told by your health care provider. General instructions  Return to your normal activities as told by your health care provider. Ask your health care provider what activities are safe.  Do not scratch or pick at the wound.  Do not use any products that contain nicotine or tobacco, such as cigarettes and e-cigarettes. These may delay wound healing. If you need help quitting, ask your health care provider.  Keep all follow-up visits as told by your health care provider.  This is important.  Eat a diet that includes protein, vitamin A, vitamin C, and other nutrient-rich foods to help the wound heal. ? Foods rich in protein include meat, dairy, beans, nuts, and other sources. ? Foods rich in vitamin A include carrots and dark green, leafy vegetables. ? Foods rich in vitamin C include citrus, tomatoes, and other fruits and vegetables. ? Nutrient-rich foods have protein, carbohydrates, fat, vitamins, or minerals. Eat a variety of healthy foods including vegetables,  fruits, and whole grains. Contact a health care provider if:  You received a tetanus shot and you have swelling, severe pain, redness, or bleeding at the injection site.  Your pain is not controlled with medicine.  You have redness, swelling, or pain around the wound.  You have fluid or blood coming from the wound.  Your wound feels warm to the touch.  You have pus or a bad smell coming from the wound.  You have a fever or chills.  You are nauseous or you vomit.  You are dizzy. Get help right away if:  You have a red streak going away from your wound.  The edges of the wound open up and separate.  Your wound is bleeding, and the bleeding does not stop with gentle pressure.  You have a rash.  You faint.  You have trouble breathing. Summary  Always wash your hands with soap and water before changing your bandage (dressing).  To help with healing, eat foods that are rich in protein, vitamin A, vitamin C, and other nutrients.  Check your wound every day for signs of infection. Contact your health care provider if you suspect that your wound is infected. This information is not intended to replace advice given to you by your health care provider. Make sure you discuss any questions you have with your health care provider. Document Revised: 01/19/2019 Document Reviewed: 04/17/2016 Elsevier Patient Education  [employer].   If you need a refill on your cardiac medications before your next appointment, please call your pharmacy.

## 2020-01-28 ENCOUNTER — Telehealth (INDEPENDENT_AMBULATORY_CARE_PROVIDER_SITE_OTHER): Payer: Medicaid Other | Admitting: *Deleted

## 2020-01-28 ENCOUNTER — Other Ambulatory Visit: Payer: Self-pay

## 2020-01-28 ENCOUNTER — Telehealth: Payer: Self-pay | Admitting: Emergency Medicine

## 2020-01-28 NOTE — Telephone Encounter (Signed)
Left message to call device clinic. Attempting to reach to do video visit for loop insertion site. Contacted daughter Romilda Joy and [facility] for h.er to call office

## 2020-01-28 NOTE — Telephone Encounter (Signed)
Called daughter and she reports she is not available to do video visit today. She will be seeing her Mother tonight and will assess wound site and send photo. Will call daughter back tomorrow at 4 pm to get assessment of wound site. Patient has memory issues.

## 2020-01-29 NOTE — Progress Notes (Signed)
Daughter assessed wound site. Patient has memory issus r/t dementia.Family reports no drainage, no edema, with edges of wound approximated. Picture sent  To verify findings. Redness at edge of incision . Reported that area is not warm to touch and patient has not had any chills or fever. Education done with family about s/sx of infection and to call office if any s/sx of infection occur.

## 2020-02-03 ENCOUNTER — Other Ambulatory Visit: Payer: Self-pay | Admitting: Physical Medicine and Rehabilitation

## 2020-02-18 ENCOUNTER — Ambulatory Visit (INDEPENDENT_AMBULATORY_CARE_PROVIDER_SITE_OTHER): Payer: Medicare Other | Admitting: *Deleted

## 2020-02-18 ENCOUNTER — Telehealth: Payer: Self-pay

## 2020-02-18 DIAGNOSIS — I63412 Cerebral infarction due to embolism of left middle cerebral artery: Secondary | ICD-10-CM | POA: Diagnosis not present

## 2020-02-18 NOTE — Telephone Encounter (Signed)
Carelink alert received- Tachy episode occurring 5/5, possible SVT.  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    Attempted to reach pt to assess symptoms.  No answer, DPR on file to leave message on cell #.  Detailed message left requesting callback.

## 2020-02-19 LAB — CUP PACEART REMOTE DEVICE CHECK
Date Time Interrogation Session: 20210506011223
Implantable Pulse Generator Implant Date: 20210405

## 2020-02-19 NOTE — Telephone Encounter (Signed)
Spoke with patient. While explaining purpose of call, call was disconnected.  Call back and LMOVM (DPR) requesting call back to discuss episode on ILR. Direct number and office hours provided.

## 2020-02-19 NOTE — Progress Notes (Signed)
Carelink Summary Report / Loop Recorder 

## 2020-02-22 ENCOUNTER — Other Ambulatory Visit: Payer: Self-pay

## 2020-02-22 NOTE — Telephone Encounter (Signed)
Spoke with patient regarding symptoms. Patient denied any symptoms that day. Patient reports taking medications correctly. During this phone conversation, I was reviewing medication doses and frequency, phone call was cut off. Attempted to call patient back, no answer. LMOVM to call DC back. Direct phone number provided.

## 2020-02-22 NOTE — Patient Outreach (Signed)
Telephone outreach to patient to obtain mRS was successfully completed. MRS=3  Denise Leon [facility]-Care Management Assistant [phone] 

## 2020-02-24 NOTE — Telephone Encounter (Signed)
Noted Thanks SK   

## 2020-03-22 ENCOUNTER — Ambulatory Visit (INDEPENDENT_AMBULATORY_CARE_PROVIDER_SITE_OTHER): Payer: Medicare Other | Admitting: *Deleted

## 2020-03-22 DIAGNOSIS — R002 Palpitations: Secondary | ICD-10-CM | POA: Diagnosis not present

## 2020-03-22 LAB — CUP PACEART REMOTE DEVICE CHECK
Date Time Interrogation Session: 20210607230257
Implantable Pulse Generator Implant Date: 20210405

## 2020-03-23 NOTE — Progress Notes (Signed)
Carelink Summary Report / Loop Recorder 

## 2020-04-25 ENCOUNTER — Ambulatory Visit (INDEPENDENT_AMBULATORY_CARE_PROVIDER_SITE_OTHER): Payer: Medicare Other | Admitting: *Deleted

## 2020-04-25 DIAGNOSIS — R002 Palpitations: Secondary | ICD-10-CM

## 2020-04-25 LAB — CUP PACEART REMOTE DEVICE CHECK
Date Time Interrogation Session: 20210711230437
Implantable Pulse Generator Implant Date: 20210405

## 2020-04-26 NOTE — Progress Notes (Signed)
Carelink Summary Report / Loop Recorder 

## 2020-05-30 ENCOUNTER — Ambulatory Visit (INDEPENDENT_AMBULATORY_CARE_PROVIDER_SITE_OTHER): Payer: Medicare Other | Admitting: *Deleted

## 2020-05-30 DIAGNOSIS — I63412 Cerebral infarction due to embolism of left middle cerebral artery: Secondary | ICD-10-CM

## 2020-05-30 LAB — CUP PACEART REMOTE DEVICE CHECK
Date Time Interrogation Session: 20210813230651
Implantable Pulse Generator Implant Date: 20210405

## 2020-05-31 NOTE — Progress Notes (Signed)
Carelink Summary Report / Loop Recorder 

## 2020-06-10 ENCOUNTER — Other Ambulatory Visit: Payer: Self-pay | Admitting: Physical Medicine and Rehabilitation

## 2020-07-04 ENCOUNTER — Ambulatory Visit (INDEPENDENT_AMBULATORY_CARE_PROVIDER_SITE_OTHER): Payer: Medicaid Other | Admitting: *Deleted

## 2020-07-04 DIAGNOSIS — R002 Palpitations: Secondary | ICD-10-CM | POA: Diagnosis not present

## 2020-07-04 LAB — CUP PACEART REMOTE DEVICE CHECK
Date Time Interrogation Session: 20210915230430
Implantable Pulse Generator Implant Date: 20210405

## 2020-07-04 NOTE — Progress Notes (Signed)
Carelink Summary Report / Loop Recorder 

## 2020-09-09 LAB — CUP PACEART REMOTE DEVICE CHECK
Date Time Interrogation Session: 20211120230148
Implantable Pulse Generator Implant Date: 20210405

## 2020-09-12 ENCOUNTER — Ambulatory Visit (INDEPENDENT_AMBULATORY_CARE_PROVIDER_SITE_OTHER): Payer: Medicare Other

## 2020-09-12 DIAGNOSIS — R002 Palpitations: Secondary | ICD-10-CM | POA: Diagnosis not present

## 2020-09-16 NOTE — Progress Notes (Signed)
Carelink Summary Report / Loop Recorder 

## 2020-10-17 ENCOUNTER — Ambulatory Visit (INDEPENDENT_AMBULATORY_CARE_PROVIDER_SITE_OTHER): Payer: Medicare Other

## 2020-10-17 DIAGNOSIS — I63412 Cerebral infarction due to embolism of left middle cerebral artery: Secondary | ICD-10-CM | POA: Diagnosis not present

## 2020-10-18 LAB — CUP PACEART REMOTE DEVICE CHECK
Date Time Interrogation Session: 20220101230243
Implantable Pulse Generator Implant Date: 20210405

## 2020-10-31 NOTE — Progress Notes (Signed)
Carelink Summary Report / Loop Recorder 

## 2020-11-18 LAB — CUP PACEART REMOTE DEVICE CHECK
Date Time Interrogation Session: 20220203230612
Implantable Pulse Generator Implant Date: 20210405

## 2020-11-21 ENCOUNTER — Ambulatory Visit (INDEPENDENT_AMBULATORY_CARE_PROVIDER_SITE_OTHER): Payer: Medicare Other

## 2020-11-21 DIAGNOSIS — I63412 Cerebral infarction due to embolism of left middle cerebral artery: Secondary | ICD-10-CM | POA: Diagnosis not present

## 2020-11-25 NOTE — Progress Notes (Signed)
Carelink Summary Report / Loop Recorder 

## 2020-12-26 ENCOUNTER — Ambulatory Visit (INDEPENDENT_AMBULATORY_CARE_PROVIDER_SITE_OTHER): Payer: Medicare Other

## 2020-12-26 DIAGNOSIS — I63412 Cerebral infarction due to embolism of left middle cerebral artery: Secondary | ICD-10-CM | POA: Diagnosis not present

## 2020-12-28 LAB — CUP PACEART REMOTE DEVICE CHECK
Date Time Interrogation Session: 20220308230426
Implantable Pulse Generator Implant Date: 20210405

## 2021-01-03 NOTE — Progress Notes (Signed)
Carelink Summary Report / Loop Recorder 

## 2021-01-30 ENCOUNTER — Ambulatory Visit (INDEPENDENT_AMBULATORY_CARE_PROVIDER_SITE_OTHER): Payer: Medicare Other

## 2021-01-30 DIAGNOSIS — I63412 Cerebral infarction due to embolism of left middle cerebral artery: Secondary | ICD-10-CM

## 2021-02-01 LAB — CUP PACEART REMOTE DEVICE CHECK
Date Time Interrogation Session: 20220410230509
Implantable Pulse Generator Implant Date: 20210405

## 2021-02-14 NOTE — Progress Notes (Signed)
Carelink Summary Report / Loop Recorder 

## 2021-03-06 ENCOUNTER — Ambulatory Visit (INDEPENDENT_AMBULATORY_CARE_PROVIDER_SITE_OTHER): Payer: Medicare Other

## 2021-03-06 DIAGNOSIS — Z95818 Presence of other cardiac implants and grafts: Secondary | ICD-10-CM | POA: Diagnosis not present

## 2021-03-06 DIAGNOSIS — I63412 Cerebral infarction due to embolism of left middle cerebral artery: Secondary | ICD-10-CM

## 2021-03-09 LAB — CUP PACEART REMOTE DEVICE CHECK
Date Time Interrogation Session: 20220513230458
Implantable Pulse Generator Implant Date: 20210405

## 2021-03-27 NOTE — Progress Notes (Signed)
Carelink Summary Report / Loop Recorder 

## 2021-04-03 ENCOUNTER — Ambulatory Visit (INDEPENDENT_AMBULATORY_CARE_PROVIDER_SITE_OTHER): Payer: Medicare Other

## 2021-04-03 DIAGNOSIS — I63412 Cerebral infarction due to embolism of left middle cerebral artery: Secondary | ICD-10-CM

## 2021-04-04 LAB — CUP PACEART REMOTE DEVICE CHECK
Date Time Interrogation Session: 20220615230253
Implantable Pulse Generator Implant Date: 20210405

## 2021-04-21 NOTE — Progress Notes (Signed)
Carelink Summary Report / Loop Recorder 

## 2021-05-04 LAB — CUP PACEART REMOTE DEVICE CHECK
Date Time Interrogation Session: 20220718230509
Implantable Pulse Generator Implant Date: 20210405

## 2021-05-08 ENCOUNTER — Ambulatory Visit (INDEPENDENT_AMBULATORY_CARE_PROVIDER_SITE_OTHER): Payer: Medicare Other

## 2021-05-08 DIAGNOSIS — I63412 Cerebral infarction due to embolism of left middle cerebral artery: Secondary | ICD-10-CM

## 2021-05-30 NOTE — Progress Notes (Signed)
Carelink Summary Report / Loop Recorder 

## 2021-06-03 ENCOUNTER — Emergency Department (HOSPITAL_BASED_OUTPATIENT_CLINIC_OR_DEPARTMENT_OTHER): Payer: Medicare Other

## 2021-06-03 ENCOUNTER — Encounter (HOSPITAL_BASED_OUTPATIENT_CLINIC_OR_DEPARTMENT_OTHER): Payer: Self-pay | Admitting: Emergency Medicine

## 2021-06-03 ENCOUNTER — Inpatient Hospital Stay (HOSPITAL_BASED_OUTPATIENT_CLINIC_OR_DEPARTMENT_OTHER)
Admission: EM | Admit: 2021-06-03 | Discharge: 2021-06-10 | DRG: 035 | Disposition: A | Discharge status: Rehabilitation Facility | Payer: Medicare Other | Attending: Internal Medicine | Admitting: Internal Medicine

## 2021-06-03 DIAGNOSIS — Z006 Encounter for examination for normal comparison and control in clinical research program: Secondary | ICD-10-CM

## 2021-06-03 DIAGNOSIS — N1831 Chronic kidney disease, stage 3a: Secondary | ICD-10-CM | POA: Diagnosis not present

## 2021-06-03 DIAGNOSIS — M545 Low back pain, unspecified: Secondary | ICD-10-CM | POA: Diagnosis not present

## 2021-06-03 DIAGNOSIS — I129 Hypertensive chronic kidney disease with stage 1 through stage 4 chronic kidney disease, or unspecified chronic kidney disease: Secondary | ICD-10-CM | POA: Diagnosis not present

## 2021-06-03 DIAGNOSIS — I160 Hypertensive urgency: Secondary | ICD-10-CM | POA: Diagnosis present

## 2021-06-03 DIAGNOSIS — F0390 Unspecified dementia without behavioral disturbance: Secondary | ICD-10-CM | POA: Diagnosis not present

## 2021-06-03 DIAGNOSIS — E785 Hyperlipidemia, unspecified: Secondary | ICD-10-CM | POA: Diagnosis present

## 2021-06-03 DIAGNOSIS — H905 Unspecified sensorineural hearing loss: Secondary | ICD-10-CM | POA: Diagnosis present

## 2021-06-03 DIAGNOSIS — Z8673 Personal history of transient ischemic attack (TIA), and cerebral infarction without residual deficits: Secondary | ICD-10-CM

## 2021-06-03 DIAGNOSIS — G8191 Hemiplegia, unspecified affecting right dominant side: Secondary | ICD-10-CM | POA: Diagnosis not present

## 2021-06-03 DIAGNOSIS — G8929 Other chronic pain: Secondary | ICD-10-CM | POA: Diagnosis present

## 2021-06-03 DIAGNOSIS — L7632 Postprocedural hematoma of skin and subcutaneous tissue following other procedure: Secondary | ICD-10-CM | POA: Diagnosis not present

## 2021-06-03 DIAGNOSIS — R350 Frequency of micturition: Secondary | ICD-10-CM | POA: Diagnosis not present

## 2021-06-03 DIAGNOSIS — R29701 NIHSS score 1: Secondary | ICD-10-CM | POA: Diagnosis present

## 2021-06-03 DIAGNOSIS — Y838 Other surgical procedures as the cause of abnormal reaction of the patient, or of later complication, without mention of misadventure at the time of the procedure: Secondary | ICD-10-CM | POA: Diagnosis not present

## 2021-06-03 DIAGNOSIS — I700 Atherosclerosis of aorta: Secondary | ICD-10-CM | POA: Diagnosis present

## 2021-06-03 DIAGNOSIS — D72829 Elevated white blood cell count, unspecified: Secondary | ICD-10-CM | POA: Diagnosis present

## 2021-06-03 DIAGNOSIS — Z79899 Other long term (current) drug therapy: Secondary | ICD-10-CM

## 2021-06-03 DIAGNOSIS — E041 Nontoxic single thyroid nodule: Secondary | ICD-10-CM | POA: Diagnosis present

## 2021-06-03 DIAGNOSIS — I7389 Other specified peripheral vascular diseases: Secondary | ICD-10-CM | POA: Diagnosis present

## 2021-06-03 DIAGNOSIS — R2990 Unspecified symptoms and signs involving the nervous system: Secondary | ICD-10-CM

## 2021-06-03 DIAGNOSIS — I63232 Cerebral infarction due to unspecified occlusion or stenosis of left carotid arteries: Principal | ICD-10-CM | POA: Diagnosis present

## 2021-06-03 DIAGNOSIS — I69351 Hemiplegia and hemiparesis following cerebral infarction affecting right dominant side: Secondary | ICD-10-CM | POA: Diagnosis not present

## 2021-06-03 DIAGNOSIS — R29810 Facial weakness: Secondary | ICD-10-CM | POA: Diagnosis not present

## 2021-06-03 DIAGNOSIS — K5900 Constipation, unspecified: Secondary | ICD-10-CM | POA: Diagnosis not present

## 2021-06-03 DIAGNOSIS — Z20822 Contact with and (suspected) exposure to covid-19: Secondary | ICD-10-CM | POA: Diagnosis present

## 2021-06-03 DIAGNOSIS — R531 Weakness: Secondary | ICD-10-CM | POA: Diagnosis present

## 2021-06-03 DIAGNOSIS — N179 Acute kidney failure, unspecified: Secondary | ICD-10-CM | POA: Diagnosis not present

## 2021-06-03 DIAGNOSIS — G459 Transient cerebral ischemic attack, unspecified: Secondary | ICD-10-CM

## 2021-06-03 DIAGNOSIS — N183 Chronic kidney disease, stage 3 unspecified: Secondary | ICD-10-CM | POA: Diagnosis present

## 2021-06-03 DIAGNOSIS — Z7982 Long term (current) use of aspirin: Secondary | ICD-10-CM

## 2021-06-03 DIAGNOSIS — I639 Cerebral infarction, unspecified: Secondary | ICD-10-CM | POA: Diagnosis present

## 2021-06-03 DIAGNOSIS — Z7902 Long term (current) use of antithrombotics/antiplatelets: Secondary | ICD-10-CM

## 2021-06-03 LAB — COMPREHENSIVE METABOLIC PANEL
ALT: 14 U/L (ref 0–44)
AST: 20 U/L (ref 15–41)
Albumin: 3.5 g/dL (ref 3.5–5.0)
Alkaline Phosphatase: 45 U/L (ref 38–126)
Anion gap: 9 (ref 5–15)
BUN: 30 mg/dL — ABNORMAL HIGH (ref 8–23)
CO2: 24 mmol/L (ref 22–32)
Calcium: 8.5 mg/dL — ABNORMAL LOW (ref 8.9–10.3)
Chloride: 104 mmol/L (ref 98–111)
Creatinine, Ser: 1.27 mg/dL — ABNORMAL HIGH (ref 0.44–1.00)
GFR, Estimated: 45 mL/min — ABNORMAL LOW (ref >60)
Glucose, Bld: 154 mg/dL — ABNORMAL HIGH (ref 70–99)
Potassium: 3.8 mmol/L (ref 3.5–5.1)
Sodium: 137 mmol/L (ref 135–145)
Total Bilirubin: 0.5 mg/dL (ref 0.3–1.2)
Total Protein: 6.4 g/dL — ABNORMAL LOW (ref 6.5–8.1)

## 2021-06-03 LAB — DIFFERENTIAL
Abs Immature Granulocytes: 0.05 10*3/uL (ref 0.00–0.07)
Basophils Absolute: 0 10*3/uL (ref 0.0–0.1)
Basophils Relative: 0 %
Eosinophils Absolute: 0.3 10*3/uL (ref 0.0–0.5)
Eosinophils Relative: 3 %
Immature Granulocytes: 0 %
Lymphocytes Relative: 29 %
Lymphs Abs: 3.2 10*3/uL (ref 0.7–4.0)
Monocytes Absolute: 1 10*3/uL (ref 0.1–1.0)
Monocytes Relative: 9 %
Neutro Abs: 6.7 10*3/uL (ref 1.7–7.7)
Neutrophils Relative %: 59 %

## 2021-06-03 LAB — ETHANOL: Alcohol, Ethyl (B): <10 mg/dL (ref <10)

## 2021-06-03 LAB — RESP PANEL BY RT-PCR (FLU A&B, COVID) ARPGX2
Influenza A by PCR: NEGATIVE
Influenza B by PCR: NEGATIVE
SARS Coronavirus 2 by RT PCR: NEGATIVE

## 2021-06-03 LAB — CBC
HCT: 35.9 % — ABNORMAL LOW (ref 36.0–46.0)
Hemoglobin: 12.2 g/dL (ref 12.0–15.0)
MCH: 30.7 pg (ref 26.0–34.0)
MCHC: 34 g/dL (ref 30.0–36.0)
MCV: 90.2 fL (ref 80.0–100.0)
Platelets: 287 10*3/uL (ref 150–400)
RBC: 3.98 MIL/uL (ref 3.87–5.11)
RDW: 12.7 % (ref 11.5–15.5)
WBC: 11.3 10*3/uL — ABNORMAL HIGH (ref 4.0–10.5)
nRBC: 0 % (ref 0.0–0.2)

## 2021-06-03 LAB — APTT: aPTT: 30 seconds (ref 24–36)

## 2021-06-03 LAB — PROTIME-INR
INR: 0.9 (ref 0.8–1.2)
Prothrombin Time: 12.5 seconds (ref 11.4–15.2)

## 2021-06-03 LAB — CBG MONITORING, ED: Glucose-Capillary: 163 mg/dL — ABNORMAL HIGH (ref 70–99)

## 2021-06-03 MED ORDER — IOHEXOL 350 MG/ML SOLN
100.0000 mL | Freq: Once | INTRAVENOUS | Status: AC | PRN
  Administered 2021-06-03: 100 mL via INTRAVENOUS

## 2021-06-03 NOTE — Consult Note (Signed)
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 Recurdescence of old stroke symptoms versus new infarct. NO thrombolytics given no new disabing symptoms, she hs had R sided weakness just worsened slightly. CTA head neck, STAT. MR brain, tele, stroke workup. CW AA and plavix, unless new infarct then change to anticoagulant. Neurology to follow along.  Metrics: Last Known Well: 06/03/2021 21:30:00 [facility] Notification Time: 06/03/2021 22:03:58 Arrival Time: 06/03/2021 21:47:00 Stamp Time: 06/03/2021 22:03:58 Initial Response Time: 06/03/2021 22:06:32 Symptoms: R sided weakness. NIHSS Start Assessment Time: 06/03/2021 22:14:55 Patient is not a candidate for Thrombolytic. Thrombolytic Medical Decision: 06/03/2021 22:22:27 Patient was not deemed candidate for Thrombolytic because of following reasons: No new disabling symptoms. .  CT head showed no acute hemorrhage or acute core infarct.  ED Physician notified of diagnostic impression and management plan on 06/03/2021 20:22:00  Advanced Imaging: CTA Head and Neck Completed.  LVO:No  Patient doesn't meet criteria for emergent NIR consideration   Our recommendations are outlined below.  Recommendations:        Stroke/Telemetry Floor       Neuro Checks       Bedside Swallow Eval       DVT Prophylaxis       IV Fluids, Normal Saline       Head of Bed 30 Degrees       Euglycemia and Avoid Hyperthermia (PRN Acetaminophen)       Antihypertensives PRN if Blood pressure is greater than 220/120 or there is a concern for End organ damage/contraindications for permissive HTN. If blood pressure is greater than 220/120 give labetalol PO or IV or Vasotec IV with a goal of 15% reduction in BP during  the first 24 hours.  Routine Consultation with Inhouse Neurology for Follow up Care  Sign Out:       Discussed with Emergency Department Provider       Discussed with Rapid Response Team    ------------------------------------------------------------------------------  History of Present Illness: Patient is a 75 year old Female.  Patient was brought by private transportation with symptoms of R sided weakness.  R arm numbness and weakness, FR face numbness. History of 2 strokes. On Plavix + ASA. Last stroke, R sided residual deficit. No thrombolytics, given no new disabling symptoms. Uses a walke at baseline since her last stroke. CT head, no abnormality.   Social History: Smoking: No Alcohol Use: No Drug Use: No  Family History:      none  Review of System:  14 Points Review of Systems was performed and was negative except mentioned in HPI.  Anticoagulant use:  No  Antiplatelet use: Yes ASA and plavix  Allergies:  Reviewed,NKDA    Examination: BP(196/99), Pulse(91), Blood Glucose(163) 1A: Level of Consciousness - Alert; keenly responsive + 0 1B: Ask Month and Age - Both Questions Right + 0 1C: Blink Eyes & Squeeze Hands - Performs Both Tasks + 0 2: Test Horizontal Extraocular Movements - Normal + 0 3: Test Visual Fields - No Visual Loss + 0 4: Test Facial Palsy (Use Grimace if Obtunded) - Normal symmetry + 0 5A: Test Left Arm Motor Drift - No Drift for 10 Seconds + 0 5B: Test Right Arm Motor Drift - Drift, but doesn't hit bed +  1 6A: Test Left Leg Motor Drift - No Drift for 5 Seconds + 0 6B: Test Right Leg Motor Drift - No Drift for 5 Seconds + 0 7: Test Limb Ataxia (FNF/Heel-Shin) - No Ataxia + 0 8: Test Sensation - Normal; No sensory loss + 0 9: Test Language/Aphasia - Normal; No aphasia + 0 10: Test Dysarthria - Normal + 0 11: Test Extinction/Inattention - No abnormality + 0  NIHSS Score: 1   Pre-Morbid Modified Rankin Scale: 2 Points = Slight  disability; unable to carry out all previous activities, but able to look after own affairs without assistance   Patient/Family was informed the Neurology Consult would occur via TeleHealth consult by way of interactive audio and video telecommunications and consented to receiving care in this manner.   Patient is being evaluated for possible acute neurologic impairment and high probability of imminent or life-threatening deterioration. I spent total of 35 minutes providing care to this patient, including time for face to face visit via telemedicine, review of medical records, imaging studies and discussion of findings with providers, the patient and/or family.   Dr Brent Bulla Artemus Romanoff   [facility] [phone]  Case [id number]

## 2021-06-03 NOTE — ED Triage Notes (Signed)
RT side weakness x 15-20 minutes; hx of stroke per family

## 2021-06-03 NOTE — ED Notes (Signed)
Pt to CT

## 2021-06-03 NOTE — ED Provider Notes (Signed)
[facility] EMERGENCY DEPARTMENT Provider Note   CSN: [id number] Arrival date & time: 06/03/21  2147     History Chief Complaint  Patient presents with   Weakness   Code Stroke    Denise Leon is a 75 y.o. female.  History of CVA, CKD, hypertension.  Presents to ER with concern for sudden onset right arm and facial numbness.  Symptoms started at approximately 20 minutes prior to arrival.  Have been constant.  No change in speech.  No change in vision.  Otherwise was feeling well earlier today.  Per review of chart has history of stroke, received tPA.  2021.  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     Family History  Problem Relation Age of Onset   Lung disease Mother     Social History   Tobacco Use   Smoking status: Never   Smokeless tobacco: Never  Vaping Use   Vaping Use: Never used  Substance Use Topics   Alcohol use: Never   Drug use: Never    Home Medications Prior to Admission medications   Medication Sig Start Date End Date Taking? Authorizing Provider   acetaminophen (TYLENOL) 325 MG tablet Take 2 tablets (650 mg total) by mouth every 4 (four) hours as needed for mild pain (or temp > 37.5 C (99.5 F)). 12/03/19   Angiulli, Mcarthur Rossetti, PA-C  amLODipine (NORVASC) 5 MG tablet TAKE 1 TABLET(5 MG) BY MOUTH DAILY 06/13/20   Raulkar, Drema Pry, MD  aspirin EC 325 MG EC tablet Take 1 tablet (325 mg total) by mouth daily. 11/17/19   Layne Benton, NP  atorvastatin (LIPITOR) 80 MG tablet Take 1 tablet (80 mg total) by mouth daily. 12/03/19   Angiulli, Mcarthur Rossetti, PA-C  carvedilol (COREG) 6.25 MG tablet Take 1 tablet (6.25 mg total) by mouth 2 (two) times daily. 12/30/19   Graciella Freer, PA-C  clopidogrel (PLAVIX) 75 MG tablet TAKE 1 TABLET(75 MG) BY MOUTH DAILY 02/03/20   Raulkar, Drema Pry, MD  diclofenac Sodium (VOLTAREN) 1 % GEL Apply 2 g topically 3 (three) times daily. 12/03/19   Angiulli, Mcarthur Rossetti, PA-C  docusate sodium (COLACE) 100 MG capsule Take 1 capsule (100 mg total) by mouth 2 (two) times daily. 12/03/19   Angiulli, Mcarthur Rossetti, PA-C  DULoxetine (CYMBALTA) 30 MG capsule Take 1 capsule (30 mg total) by mouth daily. 12/04/19   Angiulli, Mcarthur Rossetti, PA-C  gabapentin (NEURONTIN) 400 MG capsule Take 1 capsule (400 mg total) by mouth  2 (two) times daily. 12/03/19   Angiulli, Mcarthur Rossetti, PA-C  latanoprost (XALATAN) 0.005 % ophthalmic solution Place 1 drop into both eyes at bedtime. 12/03/19   Angiulli, Mcarthur Rossetti, PA-C  lidocaine (LIDODERM) 5 % Place 2 patches onto the skin daily. Remove & Discard patch within 12 hours or as directed by MD 12/03/19   Angiulli, Mcarthur Rossetti, PA-C  Multiple Vitamin (MULTIVITAMIN WITH MINERALS) TABS tablet Take 1 tablet by mouth daily. 11/16/19   Layne Benton, NP  pantoprazole (PROTONIX) 40 MG tablet Take 1 tablet (40 mg total) by mouth daily. 12/04/19   Angiulli, Mcarthur Rossetti, PA-C  Skin Protectants, Misc. (EUCERIN) cream Apply topically as needed for dry skin. 01/07/20   Raulkar, Drema Pry, MD    Allergies    Patient has no known  allergies.  Review of Systems   Review of Systems  Constitutional:  Negative for chills and fever.  HENT:  Negative for ear pain and sore throat.   Eyes:  Negative for pain and visual disturbance.  Respiratory:  Negative for cough and shortness of breath.   Cardiovascular:  Negative for chest pain and palpitations.  Gastrointestinal:  Negative for abdominal pain and vomiting.  Genitourinary:  Negative for dysuria and hematuria.  Musculoskeletal:  Negative for arthralgias and back pain.  Skin:  Negative for color change and rash.  Neurological:  Positive for weakness and numbness. Negative for seizures and syncope.  All other systems reviewed and are negative.  Physical Exam Updated Vital Signs BP (!) 160/87   Pulse 76   Temp 98.4 F (36.9 C) (Oral)   Resp 18   Wt 62.5 kg   SpO2 98%   BMI 25.20 kg/m   Physical Exam Vitals and nursing note reviewed.  Constitutional:      General: She is not in acute distress.    Appearance: She is well-developed.  HENT:     Head: Normocephalic and atraumatic.  Eyes:     Conjunctiva/sclera: Conjunctivae normal.  Cardiovascular:     Rate and Rhythm: Normal rate and regular rhythm.     Heart sounds: No murmur heard. Pulmonary:     Effort: Pulmonary effort is normal. No respiratory distress.     Breath sounds: Normal breath sounds.  Abdominal:     Palpations: Abdomen is soft.     Tenderness: There is no abdominal tenderness.  Musculoskeletal:     Cervical back: Neck supple.  Skin:    General: Skin is warm and dry.  Neurological:     Mental Status: She is alert.     Comments: AAOx3 CN 2-12 intact, speech clear visual fields intact 5/5 strength in LLE 5/5 strength in RLE 4/5 strength in RUE 5/5 strength in LUE Mild decrease in sensation in RUE Normal FNF  Psychiatric:        Mood and Affect: Mood normal.    ED Results / Procedures / Treatments   Labs (all labs ordered are listed, but only abnormal results are displayed) Labs  Reviewed  CBC - Abnormal; Notable for the following components:      Result Value   WBC 11.3 (*)    HCT 35.9 (*)    All other components within normal limits  COMPREHENSIVE METABOLIC PANEL - Abnormal; Notable for the following components:   Glucose, Bld 154 (*)    BUN 30 (*)    Creatinine, Ser 1.27 (*)    Calcium 8.5 (*)    Total Protein 6.4 (*)    GFR, Estimated  45 (*)    All other components within normal limits  CBG MONITORING, ED - Abnormal; Notable for the following components:   Glucose-Capillary 163 (*)    All other components within normal limits  RESP PANEL BY RT-PCR (FLU A&B, COVID) ARPGX2  ETHANOL  PROTIME-INR  APTT  DIFFERENTIAL  RAPID URINE DRUG SCREEN, HOSP PERFORMED  URINALYSIS, ROUTINE W REFLEX MICROSCOPIC    EKG None  Radiology CT Angio Head W or Wo Contrast  Result Date: 06/03/2021 CLINICAL DATA:  Right-sided weakness EXAM: CT ANGIOGRAPHY HEAD AND NECK TECHNIQUE: Multidetector CT imaging of the head and neck was performed using the standard protocol during bolus administration of intravenous contrast. Multiplanar CT image reconstructions and MIPs were obtained to evaluate the vascular anatomy. Carotid stenosis measurements (when applicable) are obtained utilizing NASCET criteria, using the distal internal carotid diameter as the denominator. CONTRAST:  OMNIPAQUE IOHEXOL 350 MG/ML SOLN COMPARISON:  11/14/2019 CTA head neck FINDINGS: CTA NECK FINDINGS SKELETON: There is no bony spinal canal stenosis. No lytic or blastic lesion. OTHER NECK: 2.0 cm left thyroid nodule. UPPER CHEST: No pneumothorax or pleural effusion. No nodules or masses. AORTIC ARCH: There is soft and calcific atherosclerosis of the aortic arch. There is no aneurysm, dissection or hemodynamically significant stenosis of the visualized portion of the aorta. Conventional 3 vessel aortic branching pattern. The visualized proximal subclavian arteries are widely patent. RIGHT CAROTID SYSTEM: Normal  without aneurysm, dissection or stenosis. LEFT CAROTID SYSTEM: No dissection, occlusion or aneurysm. Mild atherosclerotic calcification at the carotid bifurcation without hemodynamically significant stenosis. VERTEBRAL ARTERIES: Left dominant configuration. Both origins are clearly patent. There is no dissection, occlusion or flow-limiting stenosis to the skull base (V1-V3 segments). CTA HEAD FINDINGS POSTERIOR CIRCULATION: --Vertebral arteries: Normal V4 segments. --Inferior cerebellar arteries: Normal. --Basilar artery: Normal. --Superior cerebellar arteries: Normal. --Posterior cerebral arteries (PCA): Normal. ANTERIOR CIRCULATION: --Intracranial internal carotid arteries: Normal. --Anterior cerebral arteries (ACA): Normal. Both A1 segments are present. Patent anterior communicating artery (a-comm). --Middle cerebral arteries (MCA): Normal. VENOUS SINUSES: As permitted by contrast timing, patent. ANATOMIC VARIANTS: None Review of the MIP images confirms the above findings. IMPRESSION: 1. No emergent large vessel occlusion or high-grade stenosis of the intracranial or cervical arteries. 2. 2 cm left thyroid nodule. Recommend thyroid US (ref: J Am Coll Radiol. 2015 Feb;12(2): 143-50). Aortic Atherosclerosis (ICD10-I70.0). Electronically Signed   By: Deatra Robinson M.D.   On: 06/03/2021 22:33   CT HEAD CODE STROKE WO CONTRAST  Result Date: 06/03/2021 CLINICAL DATA:  Code stroke.  Right-sided weakness EXAM: CT HEAD WITHOUT CONTRAST TECHNIQUE: Contiguous axial images were obtained from the base of the skull through the vertex without intravenous contrast. COMPARISON:  None. FINDINGS: Brain: There is no mass, hemorrhage or extra-axial collection. There is generalized atrophy without lobar predilection. There is hypoattenuation of the periventricular white matter, most commonly indicating chronic ischemic microangiopathy. Vascular: No abnormal hyperdensity of the major intracranial arteries or dural venous sinuses.  No intracranial atherosclerosis. Skull: The visualized skull base, calvarium and extracranial soft tissues are normal. Sinuses/Orbits: No fluid levels or advanced mucosal thickening of the visualized paranasal sinuses. No mastoid or middle ear effusion. The orbits are normal. ASPECTS [facility] Stroke Program Early CT Score) - Ganglionic level infarction (caudate, lentiform nuclei, internal capsule, insula, M1-M3 cortex): 7 - Supraganglionic infarction (M4-M6 cortex): 3 Total score (0-10 with 10 being normal): 10 IMPRESSION: 1. Chronic ischemic microangiopathy and generalized atrophy without acute intracranial abnormality. 2. ASPECTS is 10. These results were called by telephone  at the time of interpretation on 06/03/2021 at 10:07 pm to provider [facility] , who verbally acknowledged these results. Electronically Signed   By: Deatra Robinson M.D.   On: 06/03/2021 22:07   CT ANGIO NECK CODE STROKE  Result Date: 06/03/2021 CLINICAL DATA:  Right-sided weakness EXAM: CT ANGIOGRAPHY HEAD AND NECK TECHNIQUE: Multidetector CT imaging of the head and neck was performed using the standard protocol during bolus administration of intravenous contrast. Multiplanar CT image reconstructions and MIPs were obtained to evaluate the vascular anatomy. Carotid stenosis measurements (when applicable) are obtained utilizing NASCET criteria, using the distal internal carotid diameter as the denominator. CONTRAST:  OMNIPAQUE IOHEXOL 350 MG/ML SOLN COMPARISON:  11/14/2019 CTA head neck FINDINGS: CTA NECK FINDINGS SKELETON: There is no bony spinal canal stenosis. No lytic or blastic lesion. OTHER NECK: 2.0 cm left thyroid nodule. UPPER CHEST: No pneumothorax or pleural effusion. No nodules or masses. AORTIC ARCH: There is soft and calcific atherosclerosis of the aortic arch. There is no aneurysm, dissection or hemodynamically significant stenosis of the visualized portion of the aorta. Conventional 3 vessel aortic branching  pattern. The visualized proximal subclavian arteries are widely patent. RIGHT CAROTID SYSTEM: Normal without aneurysm, dissection or stenosis. LEFT CAROTID SYSTEM: No dissection, occlusion or aneurysm. Mild atherosclerotic calcification at the carotid bifurcation without hemodynamically significant stenosis. VERTEBRAL ARTERIES: Left dominant configuration. Both origins are clearly patent. There is no dissection, occlusion or flow-limiting stenosis to the skull base (V1-V3 segments). CTA HEAD FINDINGS POSTERIOR CIRCULATION: --Vertebral arteries: Normal V4 segments. --Inferior cerebellar arteries: Normal. --Basilar artery: Normal. --Superior cerebellar arteries: Normal. --Posterior cerebral arteries (PCA): Normal. ANTERIOR CIRCULATION: --Intracranial internal carotid arteries: Normal. --Anterior cerebral arteries (ACA): Normal. Both A1 segments are present. Patent anterior communicating artery (a-comm). --Middle cerebral arteries (MCA): Normal. VENOUS SINUSES: As permitted by contrast timing, patent. ANATOMIC VARIANTS: None Review of the MIP images confirms the above findings. IMPRESSION: 1. No emergent large vessel occlusion or high-grade stenosis of the intracranial or cervical arteries. 2. 2 cm left thyroid nodule. Recommend thyroid US (ref: J Am Coll Radiol. 2015 Feb;12(2): 143-50). Aortic Atherosclerosis (ICD10-I70.0). Electronically Signed   By: Deatra Robinson M.D.   On: 06/03/2021 22:33    Procedures .Critical Care  Date/Time: 06/03/2021 11:11 PM Performed by: Milagros Loll, MD Authorized by: Milagros Loll, MD   Critical care provider statement:    Critical care time (minutes):  38   Critical care was time spent personally by me on the following activities:  Discussions with consultants, evaluation of patient's response to treatment, examination of patient, ordering and performing treatments and interventions, ordering and review of laboratory studies, ordering and review of radiographic  studies, pulse oximetry, re-evaluation of patient's condition, obtaining history from patient or surrogate and review of old charts   Medications Ordered in ED Medications  iohexol (OMNIPAQUE) 350 MG/ML injection 100 mL (100 mLs Intravenous Contrast Given 06/03/21 2216)    ED Course  I have reviewed the triage vital signs and the nursing notes.  Pertinent labs & imaging results that were available during my care of the patient were reviewed by me and considered in my medical decision making (see chart for details).  Clinical Course as of 06/03/21 2311  Sat Jun 03, 2021  2151 Code stroke activated, reported sudden onset right arm and facial numbness, she does have positive right arm drift [RD]  2220 Teleneurologist performing their assessment [RD]  2227 Teleneuro recommends NO tpa  [RD]    Clinical Course User  Index [RD] Milagros Lollykstra, Jackelyn Illingworth S, MD   MDM Rules/Calculators/A&P                           75 year old lady presents to ER with concern for sudden onset right-sided weakness.  Has history of prior stroke.  On exam patient does have mild right arm drift and does endorse change in sensation in her right upper extremity.  Code stroke initiated.  CT head and CTA imaging negative.  Telemetry neurologist evaluated patient and recommended no tPA.  Felt to be related to her baseline deficits and less likely new stroke.  Recommended no thrombolytics.  Did recommend admission for stroke work-up, MRI brain.  Discussed case with hospitalist, Dr. Robb Matarrtiz, [facility]   Final Clinical Impression(s) / ED Diagnoses Final diagnoses:  Stroke-like symptoms    Rx / DC Orders ED Discharge Orders     None        Milagros Lollykstra, Perle Gibbon S, MD 06/03/21 2311

## 2021-06-04 ENCOUNTER — Observation Stay (HOSPITAL_COMMUNITY): Payer: Medicare Other

## 2021-06-04 DIAGNOSIS — N1831 Chronic kidney disease, stage 3a: Secondary | ICD-10-CM

## 2021-06-04 DIAGNOSIS — Z8673 Personal history of transient ischemic attack (TIA), and cerebral infarction without residual deficits: Secondary | ICD-10-CM

## 2021-06-04 DIAGNOSIS — D72829 Elevated white blood cell count, unspecified: Secondary | ICD-10-CM

## 2021-06-04 DIAGNOSIS — E785 Hyperlipidemia, unspecified: Secondary | ICD-10-CM | POA: Diagnosis not present

## 2021-06-04 DIAGNOSIS — E041 Nontoxic single thyroid nodule: Secondary | ICD-10-CM

## 2021-06-04 DIAGNOSIS — I160 Hypertensive urgency: Secondary | ICD-10-CM

## 2021-06-04 DIAGNOSIS — R531 Weakness: Secondary | ICD-10-CM

## 2021-06-04 LAB — BASIC METABOLIC PANEL
Anion gap: 9 (ref 5–15)
BUN: 20 mg/dL (ref 8–23)
CO2: 25 mmol/L (ref 22–32)
Calcium: 8.8 mg/dL — ABNORMAL LOW (ref 8.9–10.3)
Chloride: 103 mmol/L (ref 98–111)
Creatinine, Ser: 1 mg/dL (ref 0.44–1.00)
GFR, Estimated: 59 mL/min — ABNORMAL LOW (ref >60)
Glucose, Bld: 113 mg/dL — ABNORMAL HIGH (ref 70–99)
Potassium: 3.9 mmol/L (ref 3.5–5.1)
Sodium: 137 mmol/L (ref 135–145)

## 2021-06-04 LAB — CBC WITH DIFFERENTIAL/PLATELET
Abs Immature Granulocytes: 0.05 10*3/uL (ref 0.00–0.07)
Basophils Absolute: 0 10*3/uL (ref 0.0–0.1)
Basophils Relative: 0 %
Eosinophils Absolute: 0.1 10*3/uL (ref 0.0–0.5)
Eosinophils Relative: 1 %
HCT: 38.9 % (ref 36.0–46.0)
Hemoglobin: 13.3 g/dL (ref 12.0–15.0)
Immature Granulocytes: 0 %
Lymphocytes Relative: 19 %
Lymphs Abs: 2.6 10*3/uL (ref 0.7–4.0)
MCH: 30.7 pg (ref 26.0–34.0)
MCHC: 34.2 g/dL (ref 30.0–36.0)
MCV: 89.8 fL (ref 80.0–100.0)
Monocytes Absolute: 1.1 10*3/uL — ABNORMAL HIGH (ref 0.1–1.0)
Monocytes Relative: 8 %
Neutro Abs: 9.5 10*3/uL — ABNORMAL HIGH (ref 1.7–7.7)
Neutrophils Relative %: 72 %
Platelets: 308 10*3/uL (ref 150–400)
RBC: 4.33 MIL/uL (ref 3.87–5.11)
RDW: 12.6 % (ref 11.5–15.5)
WBC: 13.4 10*3/uL — ABNORMAL HIGH (ref 4.0–10.5)
nRBC: 0 % (ref 0.0–0.2)

## 2021-06-04 LAB — LIPID PANEL
Cholesterol: 206 mg/dL — ABNORMAL HIGH (ref 0–200)
HDL: 50 mg/dL (ref >40)
LDL Cholesterol: 131 mg/dL — ABNORMAL HIGH (ref 0–99)
Total CHOL/HDL Ratio: 4.1 RATIO
Triglycerides: 124 mg/dL (ref <150)
VLDL: 25 mg/dL (ref 0–40)

## 2021-06-04 LAB — URINALYSIS, ROUTINE W REFLEX MICROSCOPIC
Bilirubin Urine: NEGATIVE
Glucose, UA: NEGATIVE mg/dL
Hgb urine dipstick: NEGATIVE
Ketones, ur: NEGATIVE mg/dL
Leukocytes,Ua: NEGATIVE
Nitrite: NEGATIVE
Protein, ur: NEGATIVE mg/dL
Specific Gravity, Urine: 1.005 (ref 1.005–1.030)
pH: 6.5 (ref 5.0–8.0)

## 2021-06-04 LAB — HEMOGLOBIN A1C
Hgb A1c MFr Bld: 5.8 % — ABNORMAL HIGH (ref 4.8–5.6)
Mean Plasma Glucose: 119.76 mg/dL

## 2021-06-04 LAB — RAPID URINE DRUG SCREEN, HOSP PERFORMED
Amphetamines: NOT DETECTED
Barbiturates: NOT DETECTED
Benzodiazepines: NOT DETECTED
Cocaine: NOT DETECTED
Opiates: NOT DETECTED
Tetrahydrocannabinol: NOT DETECTED

## 2021-06-04 LAB — VITAMIN B12: Vitamin B-12: 169 pg/mL — ABNORMAL LOW (ref 180–914)

## 2021-06-04 LAB — TSH: TSH: 1.504 u[IU]/mL (ref 0.350–4.500)

## 2021-06-04 MED ORDER — ASPIRIN 300 MG RE SUPP
300.0000 mg | Freq: Every day | RECTAL | Status: DC
  Filled 2021-06-04 (×2): qty 1

## 2021-06-04 MED ORDER — LATANOPROST 0.005 % OP SOLN
1.0000 [drp] | Freq: Every day | OPHTHALMIC | Status: DC
  Administered 2021-06-04 – 2021-06-09 (×5): 1 [drp] via OPHTHALMIC
  Filled 2021-06-04 (×2): qty 2.5

## 2021-06-04 MED ORDER — DULOXETINE HCL 30 MG PO CPEP
30.0000 mg | ORAL_CAPSULE | Freq: Every day | ORAL | Status: DC
  Administered 2021-06-04 – 2021-06-10 (×6): 30 mg via ORAL
  Filled 2021-06-04 (×6): qty 1

## 2021-06-04 MED ORDER — SODIUM CHLORIDE 0.9 % IV SOLN: Freq: Once | INTRAVENOUS | Status: AC

## 2021-06-04 MED ORDER — STROKE: EARLY STAGES OF RECOVERY BOOK
Freq: Once | Status: AC
  Filled 2021-06-04: qty 1

## 2021-06-04 MED ORDER — ASPIRIN 325 MG PO TABS
325.0000 mg | ORAL_TABLET | Freq: Every day | ORAL | Status: DC
  Administered 2021-06-04 – 2021-06-10 (×6): 325 mg via ORAL
  Filled 2021-06-04 (×6): qty 1

## 2021-06-04 MED ORDER — HYDRALAZINE HCL 20 MG/ML IJ SOLN: 10.0000 mg | INTRAMUSCULAR | Status: DC | PRN

## 2021-06-04 MED ORDER — CALCIUM GLUCONATE-NACL 1-0.675 GM/50ML-% IV SOLN
1.0000 g | Freq: Once | INTRAVENOUS | Status: AC
  Administered 2021-06-04: 1000 mg via INTRAVENOUS
  Filled 2021-06-04: qty 50

## 2021-06-04 MED ORDER — ACETAMINOPHEN 325 MG PO TABS
650.0000 mg | ORAL_TABLET | ORAL | Status: DC | PRN
  Administered 2021-06-09: 650 mg via ORAL
  Filled 2021-06-04: qty 2

## 2021-06-04 MED ORDER — ATORVASTATIN CALCIUM 80 MG PO TABS
80.0000 mg | ORAL_TABLET | Freq: Every day | ORAL | Status: DC
  Administered 2021-06-04 – 2021-06-10 (×6): 80 mg via ORAL
  Filled 2021-06-04 (×6): qty 1

## 2021-06-04 MED ORDER — ACETAMINOPHEN 650 MG RE SUPP: 650.0000 mg | RECTAL | Status: DC | PRN

## 2021-06-04 MED ORDER — ACETAMINOPHEN 160 MG/5ML PO SOLN: 650.0000 mg | ORAL | Status: DC | PRN

## 2021-06-04 MED ORDER — ENOXAPARIN SODIUM 40 MG/0.4ML IJ SOSY
40.0000 mg | PREFILLED_SYRINGE | INTRAMUSCULAR | Status: DC
  Administered 2021-06-04 – 2021-06-07 (×4): 40 mg via SUBCUTANEOUS
  Filled 2021-06-04 (×4): qty 0.4

## 2021-06-04 MED ORDER — LORAZEPAM 2 MG/ML IJ SOLN: 0.5000 mg | Freq: Once | INTRAMUSCULAR | Status: DC | PRN

## 2021-06-04 NOTE — Evaluation (Signed)
Occupational Therapy Evaluation Patient Details Name: Denise Leon MRN: [id number] DOB: [birth year] Today's Date: 06/04/2021    History of Present Illness This 75 y.o. female admitted with increased Rt sided weakness.  Work up underway with working diagnosis of new stroke vs. recrudescence.  CT of brain without acute infarct.  MRI of brain pending.  PMH includes CVA with Rt residual weakness, asthma, CKD, HTN, palpitations, hearing loss   Clinical Impression   Pt admitted with above. She demonstrates the below listed deficits and will benefit from continued OT to maximize safety and independence with BADLs.  Pt presents to OT with lethargy, generalized weakness, bil. UE limited ROM and strength with Rt worse than Lt., impaired cognition, decreased balance.  Currently pt requires min - mod A for ADLs, and min A for functional transfers.   Pt unable to provide clear info re: PLOF and no info in chart for family, but she does indicate she lives with daughter, who works.  Upon review of notes from [facility] at time of her discharge from there 11/2019 after her stroke, she was able to ambulate 100' with RW, and was able to complete ADLs with supervision which indicates a significant functional decline.  Anticipate she may benefit from short term SNF stay to maximize her safety and independence.      Follow Up Recommendations  SNF (may progress to [facility])    Equipment Recommendations  None recommended by OT    Recommendations for Other Services       Precautions / Restrictions Precautions Precautions: Fall      Mobility Bed Mobility Overal bed mobility: Needs Assistance Bed Mobility: Supine to Sit;Sit to Supine     Supine to sit: Min guard Sit to supine: Min guard   General bed mobility comments: pt moves very slowly and requires mod cues for prompting    Transfers                      Balance Overall balance assessment: Needs assistance Sitting-balance support: Feet  supported Sitting balance-Leahy Scale: Fair     Standing balance support: Single extremity supported Standing balance-Leahy Scale: Poor Standing balance comment: requires min A                           ADL either performed or assessed with clinical judgement   ADL Overall ADL's : Needs assistance/impaired Eating/Feeding: Minimal assistance;Bed level   Grooming: Wash/dry hands;Wash/dry face;Oral care;Brushing hair;Minimal assistance;Sitting   Upper Body Bathing: Moderate assistance;Sitting   Lower Body Bathing: Moderate assistance;Sit to/from stand   Upper Body Dressing : Moderate assistance;Sitting   Lower Body Dressing: Moderate assistance;Sit to/from stand Lower Body Dressing Details (indicate cue type and reason): requires mod A to don socks EOB Toilet Transfer: Minimal assistance;Stand-pivot;BSC Toilet Transfer Details (indicate cue type and reason): assist to steady         Functional mobility during ADLs: Minimal assistance       Vision   Additional Comments: Pt unable to participate in full visual assessment     Perception     Praxis      Pertinent Vitals/Pain Pain Assessment: No/denies pain Faces Pain Scale: No hurt     Hand Dominance Right   Extremity/Trunk Assessment Upper Extremity Assessment Upper Extremity Assessment: RUE deficits/detail;LUE deficits/detail RUE Deficits / Details: Pt demonstrates ~55* shoulder flexion with mass grasp/release of Rt hand.  Pt unable to tell me if this UE is worse than  her normal. RUE Coordination: decreased fine motor;decreased gross motor LUE Deficits / Details: Per chart review, pt with h/o frozen shoulder.  Pt demonstrates ~60* Shoulder flexion.  Elbow distally grossly WFL   Lower Extremity Assessment Lower Extremity Assessment: Defer to PT evaluation       Communication Communication Communication: HOH (Pt speaks English, but has significant hearing loss)   Cognition Arousal/Alertness:  Lethargic Behavior During Therapy: Flat affect Overall Cognitive Status: No family/caregiver present to determine baseline cognitive functioning                                 General Comments: Based on notes from [facility] at time of CVA, pt was able to selectively attend to tasks ~ 30 mins and was able to alternate attention.  This date, pt was lethargic.  She was able to sustain attention to task for up to 5 mins.  She required cues for problem solving and sequencing   General Comments  spoke with RN re: pt's current status    Exercises     Shoulder Instructions      Home Living Family/patient expects to be discharged to:: Private residence Living Arrangements: Children Available Help at Discharge: Family Type of Home: House Home Access: Level entry     Home Layout: Two level;Able to live on main level with bedroom/bathroom     Bathroom Shower/Tub: Walk-in shower             Additional Comments: info gleaned from previous admission.  Per chart review and pt input.  She lives with her daughter and reports her daughter works as a PT at [employer] With: Daughter    Prior Functioning/Environment Level of Independence: Independent with assistive device(s)        Comments: pt indicates she was able to complete ADLs Mod I.  Chart review indicates pt was able to ambulate 100 ft with RW, and required supervision for ADLs, including set up when she was discharged from [facility] 11/2019        OT Problem List:        OT Treatment/Interventions:      OT Goals(Current goals can be found in the care plan section) Acute Rehab OT Goals Patient Stated Goal: did not state OT Goal Formulation: With patient Time For Goal Achievement: 06/18/21 Potential to Achieve Goals: Good ADL Goals Pt Will Perform Grooming: with supervision;standing Pt Will Perform Upper Body Bathing: with set-up;with supervision;sitting Pt Will Perform Lower Body Bathing: with  supervision;sit to/from stand Pt Will Perform Upper Body Dressing: with supervision;with set-up;sitting Pt Will Perform Lower Body Dressing: with supervision;sit to/from stand Pt Will Transfer to Toilet: with supervision;ambulating;regular height toilet;bedside commode;grab bars Pt Will Perform Toileting - Clothing Manipulation and hygiene: with supervision;sit to/from stand Pt Will Perform Tub/Shower Transfer: Tub transfer;with supervision;ambulating;shower seat;rolling walker  OT Frequency: Min 2X/week   Barriers to D/C:            Co-evaluation              AM-PAC OT "6 Clicks" Daily Activity     Outcome Measure Help from another person eating meals?: A Little Help from another person taking care of personal grooming?: A Little Help from another person toileting, which includes using toliet, bedpan, or urinal?: A Little Help from another person bathing (including washing, rinsing, drying)?: A Lot Help from another person to put on and taking off regular upper body clothing?:  A Lot Help from another person to put on and taking off regular lower body clothing?: A Lot 6 Click Score: 15   End of Session Equipment Utilized During Treatment: Gait belt Nurse Communication: Mobility status  Activity Tolerance: Patient limited by fatigue Patient left: in bed;with bed alarm set;with call bell/phone within reach  OT Visit Diagnosis: Unsteadiness on feet (R26.81);Hemiplegia and hemiparesis Hemiplegia - Right/Left: Right Hemiplegia - dominant/non-dominant: Dominant Hemiplegia - caused by: Cerebral infarction                Time: 6294-7654 OT Time Calculation (min): 18 min Charges:  OT General Charges $OT Visit: 1 Visit OT Evaluation $OT Eval Moderate Complexity: 1 Mod  Eber Jones., OTR/L Acute Rehabilitation Services Pager [phone] Office [phone]   Jeani Hawking M 06/04/2021, 4:52 PM

## 2021-06-04 NOTE — ED Notes (Signed)
Pt called out for bathroom assistance. She had already voided in the bed.This RN assisted pt to [facility], changed sheets, assisted pt to perform peri care, and back to bed.

## 2021-06-04 NOTE — H&P (Addendum)
History and Physical    Denise Leon ZOX:[id number] DOB: [birth year] DOA: 06/03/2021  Referring MD/NP/PA: Sanda Klein, MD PCP: Patient, No Pcp Per (Inactive)  Patient coming from: Transfer from [facility]  Chief Complaint: Right-sided numbness  I have personally briefly reviewed patient's old medical records in [facility]   HPI: Denise Leon is a 75 y.o. female with medical history significant of hypertension, hyperlipidemia, CVA, palpitations s/p recorder, hearing loss and asthma presents with complaints of right-sided numbness and weakness starting last night around 2100.  History is somewhat limited.  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  Despite trying to use the interpreter history seem to be limited due to her hearing.  She reported having numbness  to the right side of her face and right arm, but reported that the numbness had resolved at this time in her face.  Patient also had noted some weakness in her right upper extremity.  This seems since the patient had been transferred to [facility] she reports numbness sensations in her bilateral lower extremities.  When asked patient reports that she had no weakness as it relates to her previous stroke and that her symptoms were new.  Denies having any headache, change in vision, chest pain, palpitations, nausea, vomiting, diarrhea, or dysuria. She had been still taking ASA.Marland Kitchen  A few days prior the patient was anxious which her daughter notes having during her previous stroke.  Talks with her daughter over the phone she also denies that the patient had any symptoms of weakness or is it related to her prior stroke.  ED Course: Upon admission into the emergency department patient was seen to be afebrile, respiration 15-24, blood pressures elevated up to 196/99, and all other vital signs maintained.  Code stroke was activated and due to findings of mild right arm drift with endorsement of change in sensation  of her right upper extremity.  CT scan of the brain showed no acute abnormalities and CTA of the head neck noted no signs of large vessel occlusion.  Case has been discussed with telemetry neurology who evaluated patient and recommended no tPA.  Labs significant for WBC 11.3, BUN 30, creatinine 1.27, and calcium 8.5.  Alcohol level was undetectable and UDS negative for any substances.  Influenza and COVID-19 screening were negative.  Teleneurology recommended admission for stroke work-up with MRI of the brain, but felt symptoms most likely new stroke related with baseline deficits.  Review of Systems  Constitutional:  Negative for fever and malaise/fatigue.  HENT:  Negative for congestion and nosebleeds.   Eyes:  Negative for blurred vision and pain.  Respiratory:  Negative for cough and shortness of breath.   Cardiovascular:  Negative for chest pain, palpitations and leg swelling.  Gastrointestinal:  Negative for nausea and vomiting.  Genitourinary:  Negative for dysuria and hematuria.  Musculoskeletal:  Negative for falls.  Skin:  Negative for itching and rash.  Neurological:  Positive for sensory change. Negative for dizziness and loss of consciousness.  Psychiatric/Behavioral:  Negative for substance abuse.    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 She has never used smokeless tobacco. She reports that she does not drink alcohol and does not use drugs.  No Known Allergies  Family History  Problem Relation Age of Onset   Lung disease Mother      Prior to Admission medications   Medication Sig Start Date End Date Taking? Authorizing Provider  acetaminophen (TYLENOL) 325 MG tablet Take 2 tablets (650 mg total) by mouth every 4 (four) hours as needed for mild pain (or temp > 37.5 C (99.5 F)). 12/03/19   Angiulli, Mcarthur Rossetti, PA-C  amLODipine (NORVASC) 5 MG tablet TAKE 1 TABLET(5 MG) BY MOUTH DAILY 06/13/20   Raulkar, Drema Pry, MD  aspirin EC 325 MG EC tablet Take 1 tablet (325 mg total) by mouth daily. 11/17/19   Layne Benton, NP  atorvastatin (LIPITOR) 80 MG tablet Take 1 tablet (80 mg total) by mouth daily. 12/03/19   Angiulli, Mcarthur Rossetti, PA-C  carvedilol (COREG) 6.25 MG tablet Take 1 tablet (6.25 mg total) by mouth 2 (two) times daily. 12/30/19   Graciella Freer, PA-C  clopidogrel (PLAVIX) 75 MG tablet TAKE 1 TABLET(75 MG) BY MOUTH DAILY 02/03/20   Raulkar, Drema Pry, MD  diclofenac Sodium (VOLTAREN) 1 % GEL Apply 2 g topically 3 (three) times daily. 12/03/19   Angiulli, Mcarthur Rossetti, PA-C  docusate sodium (COLACE) 100 MG capsule Take 1 capsule (100 mg total) by mouth 2 (two) times daily. 12/03/19   Angiulli, Mcarthur Rossetti, PA-C  DULoxetine (CYMBALTA) 30 MG capsule Take 1 capsule (30 mg total) by mouth daily. 12/04/19   Angiulli, Mcarthur Rossetti, PA-C  gabapentin (NEURONTIN) 400 MG capsule Take 1 capsule (400 mg total) by mouth 2 (two) times daily. 12/03/19   Angiulli, Mcarthur Rossetti, PA-C  latanoprost (XALATAN) 0.005 % ophthalmic solution Place 1 drop into both eyes at bedtime. 12/03/19   Angiulli, Mcarthur Rossetti, PA-C  lidocaine (LIDODERM) 5 % Place 2 patches onto the skin daily. Remove & Discard patch within 12 hours or as directed by MD 12/03/19   Angiulli, Mcarthur Rossetti, PA-C  Multiple Vitamin (MULTIVITAMIN WITH MINERALS) TABS tablet Take 1 tablet by mouth daily. 11/16/19   Layne Benton, NP  pantoprazole (PROTONIX) 40 MG tablet Take 1 tablet (40 mg total) by mouth daily. 12/04/19   Angiulli, Mcarthur Rossetti, PA-C  Skin Protectants, Misc. (EUCERIN) cream Apply topically as  needed for dry skin. 01/07/20   Raulkar, Drema Pry, MD    Physical Exam:  Constitutional: Elderly female currently in no acute distress Vitals:   06/04/21 0530 06/04/21 0545 06/04/21 0715 06/04/21 0854  BP: (!) 166/74 (!) 159/98 (!) 130/97 (!) 153/79  Pulse:   88 71  Resp: (!) 24 (!) 24 20 16   Temp:    97.9 F (36.6 C)  TempSrc:    Oral  SpO2:   96% 100%  Weight:       Eyes: PERRL, lids and conjunctivae normal ENMT: Mucous membranes are moist. Posterior pharynx clear of any exudate or lesions.  Hard of hearing. Neck: normal, supple, no masses, no thyromegaly Respiratory: clear to auscultation bilaterally, no wheezing, no crackles. Normal respiratory effort. No accessory muscle use.  Cardiovascular: Regular rate and rhythm, no murmurs / rubs / gallops. No extremity edema. 2+ pedal pulses. No carotid bruits.  Abdomen: no tenderness, no masses palpated. No hepatosplenomegaly. Bowel sounds positive.  Musculoskeletal:  no clubbing / cyanosis. No joint deformity upper and lower extremities. Good ROM, no contractures. Normal muscle tone.  Skin: no rashes, lesions, ulcers. No induration Neurologic: CN 2-12 grossly intact.  Abnormal sensation reported of the bilateral lower extremities at this time.  Strength 4/5 in both upper extremities with a mild drift noted on the right upper extremity.  Strength 5/5 in both lower extremities. Psychiatric: Normal judgment and insight. Alert and oriented x 3.  Anxious mood.     Labs on Admission: I have personally reviewed following labs and imaging studies  CBC: Recent Labs  Lab 06/03/21 2200  WBC 11.3*  NEUTROABS 6.7  HGB 12.2  HCT 35.9*  MCV 90.2  PLT 287   Basic Metabolic Panel: Recent Labs  Lab 06/03/21 2200  NA 137  K 3.8  CL 104  CO2 24  GLUCOSE 154*  BUN 30*  CREATININE 1.27*  CALCIUM 8.5*   GFR: CrCl cannot be calculated (Unknown ideal weight.). Liver Function Tests: Recent Labs  Lab 06/03/21 2200  AST 20  ALT 14   ALKPHOS 45  BILITOT 0.5  PROT 6.4*  ALBUMIN 3.5   No results for input(s): LIPASE, AMYLASE in the last 168 hours. No results for input(s): AMMONIA in the last 168 hours. Coagulation Profile: Recent Labs  Lab 06/03/21 2200  INR 0.9   Cardiac Enzymes: No results for input(s): CKTOTAL, CKMB, CKMBINDEX, TROPONINI in the last 168 hours. BNP (last 3 results) No results for input(s): PROBNP in the last 8760 hours. HbA1C: No results for input(s): HGBA1C in the last 72 hours. CBG: Recent Labs  Lab 06/03/21 2152  GLUCAP 163*   Lipid Profile: No results for input(s): CHOL, HDL, LDLCALC, TRIG, CHOLHDL, LDLDIRECT in the last 72 hours. Thyroid Function Tests: No results for input(s): TSH, T4TOTAL, FREET4, T3FREE, THYROIDAB in the last 72 hours. Anemia Panel: No results for input(s): VITAMINB12, FOLATE, FERRITIN, TIBC, IRON, RETICCTPCT in the last 72 hours. Urine analysis:    Component Value Date/Time   COLORURINE STRAW (A) 06/04/2021 0127   APPEARANCEUR CLEAR 06/04/2021 0127   LABSPEC 1.005 06/04/2021 0127   PHURINE 6.5 06/04/2021 0127   GLUCOSEU NEGATIVE 06/04/2021 0127   HGBUR NEGATIVE 06/04/2021 0127   BILIRUBINUR NEGATIVE 06/04/2021 0127   KETONESUR NEGATIVE 06/04/2021 0127   PROTEINUR NEGATIVE 06/04/2021 0127   NITRITE NEGATIVE 06/04/2021 0127   LEUKOCYTESUR NEGATIVE 06/04/2021 0127   Sepsis Labs: Recent Results (from the past 240 hour(s))  Resp Panel by RT-PCR (Flu A&B, Covid) Nasopharyngeal Swab     Status: None   Collection Time: 06/03/21 10:13 PM   Specimen: Nasopharyngeal Swab; Nasopharyngeal(NP) swabs in vial transport medium  Result Value Ref Range Status   SARS Coronavirus 2 by RT PCR NEGATIVE NEGATIVE Final    Comment: (NOTE) SARS-CoV-2 target nucleic acids are NOT DETECTED.  The SARS-CoV-2 RNA is generally detectable in upper respiratory specimens during the acute phase of infection. The lowest concentration of SARS-CoV-2 viral copies this assay can detect  is 138 copies/mL. A negative result does not preclude SARS-Cov-2 infection and should not be used as the sole basis for treatment or other patient management decisions. A negative result may occur with  improper specimen collection/handling, submission of specimen other than nasopharyngeal swab, presence of viral mutation(s) within the areas targeted by this assay, and inadequate number of viral copies(<138 copies/mL). A negative result must be combined with clinical observations, patient history, and epidemiological information. The expected result is Negative.  Fact Sheet for Patients:  BloggerCourse.comhttps://www.fda.gov/media/152166/download  Fact Sheet for Healthcare Providers:  SeriousBroker.it  This test is no t yet approved or cleared by the [place] FDA and  has been authorized for detection and/or diagnosis of SARS-CoV-2 by FDA under an Emergency Use Authorization (EUA). This EUA will remain  in effect (meaning this test can be used) for the duration of the COVID-19 declaration under Section 564(b)(1) of the Act, 21 U.S.C.section 360bbb-3(b)(1), unless the authorization is terminated  or revoked sooner.       Influenza A by PCR NEGATIVE NEGATIVE Final   Influenza B by PCR NEGATIVE NEGATIVE Final    Comment: (NOTE) The Xpert Xpress SARS-CoV-2/FLU/RSV plus assay is intended as an aid in the diagnosis of influenza from Nasopharyngeal swab specimens and should not be used as a sole basis for treatment. Nasal washings and aspirates are unacceptable for Xpert Xpress SARS-CoV-2/FLU/RSV testing.  Fact Sheet for Patients: BloggerCourse.com  Fact Sheet for Healthcare Providers: SeriousBroker.it  This test is not yet approved or cleared by the [place] FDA and has been authorized for detection and/or diagnosis of SARS-CoV-2 by FDA under an Emergency Use Authorization (EUA). This EUA will remain in effect  (meaning this test can be used) for the duration of the COVID-19 declaration under Section 564(b)(1) of the Act, 21 U.S.C. section 360bbb-3(b)(1), unless the authorization is terminated or revoked.  Performed at [facility], [address] [place], [place] [address]      Radiological Exams on Admission: CT Angio Head W or Wo Contrast  Result Date: 06/03/2021 CLINICAL DATA:  Right-sided weakness EXAM: CT ANGIOGRAPHY HEAD AND NECK TECHNIQUE: Multidetector CT imaging of the head and neck was performed using the standard protocol during bolus administration of intravenous contrast. Multiplanar CT image reconstructions and MIPs were obtained to evaluate the vascular anatomy. Carotid stenosis measurements (when applicable) are obtained utilizing NASCET criteria, using the distal internal carotid diameter as the denominator. CONTRAST:  OMNIPAQUE IOHEXOL 350 MG/ML SOLN COMPARISON:  11/14/2019 CTA head neck FINDINGS: CTA NECK FINDINGS SKELETON: There is no bony spinal canal stenosis. No lytic or blastic lesion. OTHER NECK: 2.0 cm left thyroid nodule. UPPER CHEST: No pneumothorax or pleural effusion. No nodules or masses. AORTIC ARCH: There is soft and calcific atherosclerosis of the aortic arch. There is no aneurysm, dissection or hemodynamically significant stenosis of the visualized portion of the aorta. Conventional 3 vessel aortic branching pattern. The visualized proximal subclavian arteries are widely patent. RIGHT CAROTID SYSTEM: Normal without aneurysm, dissection or stenosis. LEFT CAROTID SYSTEM: No dissection, occlusion or aneurysm. Mild atherosclerotic calcification at the carotid bifurcation without hemodynamically significant stenosis. VERTEBRAL ARTERIES: Left dominant configuration. Both origins are clearly patent. There is no dissection, occlusion or flow-limiting stenosis to the skull base (V1-V3 segments). CTA HEAD FINDINGS POSTERIOR CIRCULATION: --Vertebral arteries: Normal  V4 segments. --Inferior cerebellar arteries: Normal. --Basilar artery: Normal. --Superior cerebellar arteries: Normal. --Posterior cerebral arteries (PCA): Normal. ANTERIOR CIRCULATION: --Intracranial internal carotid arteries: Normal. --Anterior cerebral arteries (ACA): Normal. Both A1 segments are present. Patent anterior communicating artery (a-comm). --Middle cerebral arteries (MCA): Normal. VENOUS SINUSES: As permitted by contrast timing, patent. ANATOMIC VARIANTS: None Review of the MIP images confirms the above findings. IMPRESSION: 1. No emergent large vessel occlusion or high-grade stenosis of the intracranial or cervical arteries. 2. 2 cm left thyroid nodule. Recommend thyroid US (ref: J Am Coll Radiol. 2015 Feb;12(2): 143-50). Aortic Atherosclerosis (ICD10-I70.0). Electronically Signed   By: Deatra Robinson M.D.   On: 06/03/2021 22:33   CT HEAD  CODE STROKE WO CONTRAST  Result Date: 06/03/2021 CLINICAL DATA:  Code stroke.  Right-sided weakness EXAM: CT HEAD WITHOUT CONTRAST TECHNIQUE: Contiguous axial images were obtained from the base of the skull through the vertex without intravenous contrast. COMPARISON:  None. FINDINGS: Brain: There is no mass, hemorrhage or extra-axial collection. There is generalized atrophy without lobar predilection. There is hypoattenuation of the periventricular white matter, most commonly indicating chronic ischemic microangiopathy. Vascular: No abnormal hyperdensity of the major intracranial arteries or dural venous sinuses. No intracranial atherosclerosis. Skull: The visualized skull base, calvarium and extracranial soft tissues are normal. Sinuses/Orbits: No fluid levels or advanced mucosal thickening of the visualized paranasal sinuses. No mastoid or middle ear effusion. The orbits are normal. ASPECTS [facility] Stroke Program Early CT Score) - Ganglionic level infarction (caudate, lentiform nuclei, internal capsule, insula, M1-M3 cortex): 7 - Supraganglionic infarction  (M4-M6 cortex): 3 Total score (0-10 with 10 being normal): 10 IMPRESSION: 1. Chronic ischemic microangiopathy and generalized atrophy without acute intracranial abnormality. 2. ASPECTS is 10. These results were called by telephone at the time of interpretation on 06/03/2021 at 10:07 pm to provider [facility] , who verbally acknowledged these results. Electronically Signed   By: Deatra Robinson M.D.   On: 06/03/2021 22:07   CT ANGIO NECK CODE STROKE  Result Date: 06/03/2021 CLINICAL DATA:  Right-sided weakness EXAM: CT ANGIOGRAPHY HEAD AND NECK TECHNIQUE: Multidetector CT imaging of the head and neck was performed using the standard protocol during bolus administration of intravenous contrast. Multiplanar CT image reconstructions and MIPs were obtained to evaluate the vascular anatomy. Carotid stenosis measurements (when applicable) are obtained utilizing NASCET criteria, using the distal internal carotid diameter as the denominator. CONTRAST:  OMNIPAQUE IOHEXOL 350 MG/ML SOLN COMPARISON:  11/14/2019 CTA head neck FINDINGS: CTA NECK FINDINGS SKELETON: There is no bony spinal canal stenosis. No lytic or blastic lesion. OTHER NECK: 2.0 cm left thyroid nodule. UPPER CHEST: No pneumothorax or pleural effusion. No nodules or masses. AORTIC ARCH: There is soft and calcific atherosclerosis of the aortic arch. There is no aneurysm, dissection or hemodynamically significant stenosis of the visualized portion of the aorta. Conventional 3 vessel aortic branching pattern. The visualized proximal subclavian arteries are widely patent. RIGHT CAROTID SYSTEM: Normal without aneurysm, dissection or stenosis. LEFT CAROTID SYSTEM: No dissection, occlusion or aneurysm. Mild atherosclerotic calcification at the carotid bifurcation without hemodynamically significant stenosis. VERTEBRAL ARTERIES: Left dominant configuration. Both origins are clearly patent. There is no dissection, occlusion or flow-limiting stenosis to the  skull base (V1-V3 segments). CTA HEAD FINDINGS POSTERIOR CIRCULATION: --Vertebral arteries: Normal V4 segments. --Inferior cerebellar arteries: Normal. --Basilar artery: Normal. --Superior cerebellar arteries: Normal. --Posterior cerebral arteries (PCA): Normal. ANTERIOR CIRCULATION: --Intracranial internal carotid arteries: Normal. --Anterior cerebral arteries (ACA): Normal. Both A1 segments are present. Patent anterior communicating artery (a-comm). --Middle cerebral arteries (MCA): Normal. VENOUS SINUSES: As permitted by contrast timing, patent. ANATOMIC VARIANTS: None Review of the MIP images confirms the above findings. IMPRESSION: 1. No emergent large vessel occlusion or high-grade stenosis of the intracranial or cervical arteries. 2. 2 cm left thyroid nodule. Recommend thyroid US (ref: J Am Coll Radiol. 2015 Feb;12(2): 143-50). Aortic Atherosclerosis (ICD10-I70.0). Electronically Signed   By: Deatra Robinson M.D.   On: 06/03/2021 22:33    EKG: Independently reviewed.  Sinus rhythm at 76 bpm  Assessment/Plan Acute right-sided paresthesias and weakness history of CVA: Patient presents with complaints of right-sided numbness and weakness.  CT and CTA of the head and neck did not show  any acute intracranial abnormality or large vessel occlusion.  Patient was not a tPA candidate as symptoms were thought likely related to patient's previous stroke where she had left middle cerebral artery stroke status post tPA in 10/2019.  Most recent interrogation of loop recorder from 05/05/2021 did not note any signs of atrial fibrillation.  Question stroke/TIA versus electrolyte abnormality as cause of symptoms. -Admit to telemetry bed -Stroke order set initiated -Neuro checks -Check Hemoglobin A1c, lipid panel, TSH, vitamin B12 -Check  MRI head w/o contrast if compatible with loop recorder -Interrogate loop recorder -PT/OT/Speech to eval and treat -Check echocardiogram -ASA  -Appreciate neurology consultative  services, will follow-up for any further recommendations    Leukocytosis: WBC elevated 11.3.  Urinalysis showed no significant signs of infection.  Possibly reactive in nature.  Patient does not report anything to give concern for underlying infection. -Recheck CBC with differential  Hypocalcemia: Acute.  Calcium was noted to be 8.5 on 8/20. -Give 1 g of calcium gluconate IV -Continue to monitor and replace as needed  Hypertensive urgency: Acute.  On admission blood pressures noted to be elevated up to 196/99. -Allow for permissive hypertension at this time treating for systolic blood pressures greater than 220 or diastolic greater than 110  Chronic kidney disease stage IIIa: Labs from 8/20 creatinine 1.27 with BUN 30.  Elevated BUN to creatinine ratio suggest possible dehydration. -Normal saline IV fluids at 75 mL/h for 1 L -Continue to monitor kidney function  Hyperlipidemia -Follow-up lipid panel -Continue atorvastatin 80 mg daily  History of thyroid nodule -Follow-up TSH DVT prophylaxis: Lovenox Code Status: Full Family Communication: Onalee Hua  Disposition Plan: Likely discharge home once medically stable Consults called: Neurology Admission status: Observation  Clydie Braun MD [facility]   If 7PM-7AM, please contact night-coverage   06/04/2021, 10:52 AM

## 2021-06-04 NOTE — ED Notes (Signed)
ED TO INPATIENT HANDOFF REPORT  ED Nurse Name and Phone #: [phone]  [phone]  S Name/Age/Gender Denise Leon 75 y.o. female Room/Bed: MH05/MH05  Code Status   Code Status: Prior  Home/SNF/Other [facility] [phone] Patient oriented to: self, place, time and situation Is this baseline? Yes   Triage Complete: Triage complete  Chief Complaint Acute right-sided weakness [R53.1]  Triage Note RT side weakness x 15-20 minutes; hx of stroke per family    Allergies No Known Allergies  Level of Care/Admitting Diagnosis ED Disposition    ED Disposition  Admit   Condition  --   Comment  Hospital Area: [facility] [[id number]]  Level of Care: Telemetry Medical [104]  Interfacility transfer: Yes  May place patient in observation at [facility] or [facility] if equivalent level of care is available:: No  Covid Evaluation: Asymptomatic Screening Protocol (No Symptoms)  Diagnosis: Acute right-sided weakness [761950]  Admitting Physician: Bobette Mo [[id number]]  Attending Physician: MYLEY, BAHNER [[id number]]         B Medical/Surgery History Past Medical History:  Diagnosis Date  . Acute ischemic stroke (HCC) patchy L frontal infarcts s/p tPA 11/14/2019  . Asthma   . CKD (chronic kidney disease), stage IIIb 11/16/2019  . Hyperlipidemia 11/16/2019  . Hypertension   . 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 Palpitations 11/16/2019  . Stroke (HCC) 11/14/2019  . Thyroid nodule, L 11/16/2019   Past Surgical History:  Procedure Laterality Date  . HIP SURGERY    . NECK SURGERY       A IV Location/Drains/Wounds Patient Lines/Drains/Airways Status    Active Line/Drains/Airways    None          Intake/Output Last 24 hours No intake or output data in the 24 hours ending 06/04/21 3382  Labs/Imaging Results for orders placed or performed during the hospital encounter of 06/03/21 (from the past 48 hour(s))  POC CBG, ED     Status: Abnormal    Collection Time: 06/03/21  9:52 PM  Result Value Ref Range   Glucose-Capillary 163 (H) 70 - 99 mg/dL    Comment: Glucose reference range applies only to samples taken after fasting for at least 8 hours.  Ethanol     Status: None   Collection Time: 06/03/21 10:00 PM  Result Value Ref Range   Alcohol, Ethyl (B) <10 <10 mg/dL    Comment: (NOTE) Lowest detectable limit for serum alcohol is 10 mg/dL.  For medical purposes only. Performed at [facility], [address] [place], [place] [address]   Protime-INR     Status: None   Collection Time: 06/03/21 10:00 PM  Result Value Ref Range   Prothrombin Time 12.5 11.4 - 15.2 seconds   INR 0.9 0.8 - 1.2    Comment: (NOTE) INR goal varies based on device and disease states. Performed at [facility], [address] [place], [place] [address]   APTT     Status: None   Collection Time: 06/03/21 10:00 PM  Result Value Ref Range   aPTT 30 24 - 36 seconds    Comment: Performed at [facility], [address] [place], [place] [address]  CBC     Status: Abnormal   Collection Time: 06/03/21 10:00 PM  Result Value Ref Range   WBC 11.3 (H) 4.0 - 10.5 K/uL   RBC 3.98 3.87 - 5.11 MIL/uL   Hemoglobin 12.2 12.0 - 15.0  g/dL   HCT 35.9 (L) 45.4 - 09.8 %   MCV 90.2 80.0 - 100.0 fL   MCH 396.0.7 26.0 - 34.0 pg   MCHC 34.0 30.0 - 36.0 g/dL   RDW 11.9 14.7 - 82.9 %   Platelets 287 150 - 400 K/uL   nRBC 0.0 0.0 - 0.2 %    Comment: Performed at [facility], [address] [place], [place] [address]  Differential     Status: None   Collection Time: 06/03/21 10:00 PM  Result Value Ref Range   Neutrophils Relative % 59 %   Neutro Abs 6.7 1.7 - 7.7 K/uL   Lymphocytes Relative 29 %   Lymphs Abs 3.2 0.7 - 4.0 K/uL   Monocytes Relative 9 %   Monocytes Absolute 1.0 0.1 - 1.0 K/uL   Eosinophils Relative 3 %   Eosinophils Absolute 0.3 0.0 - 0.5 K/uL   Basophils Relative 0 %   Basophils Absolute 0.0 0.0 - 0.1  K/uL   Immature Granulocytes 0 %   Abs Immature Granulocytes 0.05 0.00 - 0.07 K/uL    Comment: Performed at [facility], [address] [place], [place] [address]  Comprehensive metabolic panel     Status: Abnormal   Collection Time: 06/03/21 10:00 PM  Result Value Ref Range   Sodium 137 135 - 145 mmol/L   Potassium 3.8 3.5 - 5.1 mmol/L   Chloride 104 98 - 111 mmol/L   CO2 24 22 - 32 mmol/L   Glucose, Bld 154 (H) 70 - 99 mg/dL    Comment: Glucose reference range applies only to samples taken after fasting for at least 8 hours.   BUN 30 (H) 8 - 23 mg/dL   Creatinine, Ser 8.46 (H) 0.44 - 1.00 mg/dL   Calcium 8.5 (L) 8.9 - 10.3 mg/dL   Total Protein 6.4 (L) 6.5 - 8.1 g/dL   Albumin 3.5 3.5 - 5.0 g/dL   AST 20 15 - 41 U/L   ALT 14 0 - 44 U/L   Alkaline Phosphatase 45 38 - 126 U/L   Total Bilirubin 0.5 0.3 - 1.2 mg/dL   GFR, Estimated 45 (L) >60 mL/min    Comment: (NOTE) Calculated using the CKD-EPI Creatinine Equation (2021)    Anion gap 9 5 - 15    Comment: Performed at [facility], [address] [place], [place] [address]  Resp Panel by RT-PCR (Flu A&B, Covid) Nasopharyngeal Swab     Status: None   Collection Time: 06/03/21 10:13 PM   Specimen: Nasopharyngeal Swab; Nasopharyngeal(NP) swabs in vial transport medium  Result Value Ref Range   SARS Coronavirus 2 by RT PCR NEGATIVE NEGATIVE    Comment: (NOTE) SARS-CoV-2 target nucleic acids are NOT DETECTED.  The SARS-CoV-2 RNA is generally detectable in upper respiratory specimens during the acute phase of infection. The lowest concentration of SARS-CoV-2 viral copies this assay can detect is 138 copies/mL. A negative result does not preclude SARS-Cov-2 infection and should not be used as the sole basis for treatment or other patient management decisions. A negative result may occur with  improper specimen collection/handling, submission of specimen other than nasopharyngeal swab, presence of viral  mutation(s) within the areas targeted by this assay, and inadequate number of viral copies(<138 copies/mL). A negative result must be combined with clinical observations, patient history, and epidemiological information. The expected result is Negative.  Fact Sheet for Patients:  BloggerCourse.com  Fact Sheet for Healthcare  Providers:  SeriousBroker.it  This test is no t yet approved or cleared by the [place] and  has been authorized for detection and/or diagnosis of SARS-CoV-2 by FDA under an Emergency Use Authorization (EUA). This EUA will remain  in effect (meaning this test can be used) for the duration of the COVID-19 declaration under Section 564(b)(1) of the Act, 21 U.S.C.section 360bbb-3(b)(1), unless the authorization is terminated  or revoked sooner.       Influenza A by PCR NEGATIVE NEGATIVE   Influenza B by PCR NEGATIVE NEGATIVE    Comment: (NOTE) The Xpert Xpress SARS-CoV-2/FLU/RSV plus assay is intended as an aid in the diagnosis of influenza from Nasopharyngeal swab specimens and should not be used as a sole basis for treatment. Nasal washings and aspirates are unacceptable for Xpert Xpress SARS-CoV-2/FLU/RSV testing.  Fact Sheet for Patients: BloggerCourse.com  Fact Sheet for Healthcare Providers: SeriousBroker.it  This test is not yet approved or cleared by the [place] FDA and has been authorized for detection and/or diagnosis of SARS-CoV-2 by FDA under an Emergency Use Authorization (EUA). This EUA will remain in effect (meaning this test can be used) for the duration of the COVID-19 declaration under Section 564(b)(1) of the Act, 21 U.S.C. section 360bbb-3(b)(1), unless the authorization is terminated or revoked.  Performed at [facility], [address] [place], [place] [address]   Urine rapid drug screen (hosp performed)      Status: None   Collection Time: 06/04/21  1:27 AM  Result Value Ref Range   Opiates NONE DETECTED NONE DETECTED   Cocaine NONE DETECTED NONE DETECTED   Benzodiazepines NONE DETECTED NONE DETECTED   Amphetamines NONE DETECTED NONE DETECTED   Tetrahydrocannabinol NONE DETECTED NONE DETECTED   Barbiturates NONE DETECTED NONE DETECTED    Comment: (NOTE) DRUG SCREEN FOR MEDICAL PURPOSES ONLY.  IF CONFIRMATION IS NEEDED FOR ANY PURPOSE, NOTIFY LAB WITHIN 5 DAYS.  LOWEST DETECTABLE LIMITS FOR URINE DRUG SCREEN Drug Class                     Cutoff (ng/mL) Amphetamine and metabolites    1000 Barbiturate and metabolites    200 Benzodiazepine                 200 Tricyclics and metabolites     300 Opiates and metabolites        300 Cocaine and metabolites        300 THC                            50 Performed at [facility], [address] [place], [place] [address]   Urinalysis, Routine w reflex microscopic     Status: Abnormal   Collection Time: 06/04/21  1:27 AM  Result Value Ref Range   Color, Urine STRAW (A) YELLOW   APPearance CLEAR CLEAR   Specific Gravity, Urine 1.005 1.005 - 1.030   pH 6.5 5.0 - 8.0   Glucose, UA NEGATIVE NEGATIVE mg/dL   Hgb urine dipstick NEGATIVE NEGATIVE   Bilirubin Urine NEGATIVE NEGATIVE   Ketones, ur NEGATIVE NEGATIVE mg/dL   Protein, ur NEGATIVE NEGATIVE mg/dL   Nitrite NEGATIVE NEGATIVE   Leukocytes,Ua NEGATIVE NEGATIVE    Comment: Microscopic not done on urines with negative protein, blood, leukocytes, nitrite, or glucose < 500 mg/dL. Performed at [facility], [address] [place], [place]  [address]    CT Angio Head W or Wo Contrast  Result Date: 06/03/2021 CLINICAL DATA:  Right-sided weakness EXAM: CT ANGIOGRAPHY HEAD AND NECK TECHNIQUE: Multidetector CT imaging of the head and neck was performed using the standard protocol during bolus administration of intravenous contrast. Multiplanar CT image reconstructions  and MIPs were obtained to evaluate the vascular anatomy. Carotid stenosis measurements (when applicable) are obtained utilizing NASCET criteria, using the distal internal carotid diameter as the denominator. CONTRAST:  OMNIPAQUE IOHEXOL 350 MG/ML SOLN COMPARISON:  11/14/2019 CTA head neck FINDINGS: CTA NECK FINDINGS SKELETON: There is no bony spinal canal stenosis. No lytic or blastic lesion. OTHER NECK: 2.0 cm left thyroid nodule. UPPER CHEST: No pneumothorax or pleural effusion. No nodules or masses. AORTIC ARCH: There is soft and calcific atherosclerosis of the aortic arch. There is no aneurysm, dissection or hemodynamically significant stenosis of the visualized portion of the aorta. Conventional 3 vessel aortic branching pattern. The visualized proximal subclavian arteries are widely patent. RIGHT CAROTID SYSTEM: Normal without aneurysm, dissection or stenosis. LEFT CAROTID SYSTEM: No dissection, occlusion or aneurysm. Mild atherosclerotic calcification at the carotid bifurcation without hemodynamically significant stenosis. VERTEBRAL ARTERIES: Left dominant configuration. Both origins are clearly patent. There is no dissection, occlusion or flow-limiting stenosis to the skull base (V1-V3 segments). CTA HEAD FINDINGS POSTERIOR CIRCULATION: --Vertebral arteries: Normal V4 segments. --Inferior cerebellar arteries: Normal. --Basilar artery: Normal. --Superior cerebellar arteries: Normal. --Posterior cerebral arteries (PCA): Normal. ANTERIOR CIRCULATION: --Intracranial internal carotid arteries: Normal. --Anterior cerebral arteries (ACA): Normal. Both A1 segments are present. Patent anterior communicating artery (a-comm). --Middle cerebral arteries (MCA): Normal. VENOUS SINUSES: As permitted by contrast timing, patent. ANATOMIC VARIANTS: None Review of the MIP images confirms the above findings. IMPRESSION: 1. No emergent large vessel occlusion or high-grade stenosis of the intracranial or cervical arteries.  2. 2 cm left thyroid nodule. Recommend thyroid US (ref: J Am Coll Radiol. 2015 Feb;12(2): 143-50). Aortic Atherosclerosis (ICD10-I70.0). Electronically Signed   By: Deatra Robinson M.D.   On: 06/03/2021 22:33   CT HEAD CODE STROKE WO CONTRAST  Result Date: 06/03/2021 CLINICAL DATA:  Code stroke.  Right-sided weakness EXAM: CT HEAD WITHOUT CONTRAST TECHNIQUE: Contiguous axial images were obtained from the base of the skull through the vertex without intravenous contrast. COMPARISON:  None. FINDINGS: Brain: There is no mass, hemorrhage or extra-axial collection. There is generalized atrophy without lobar predilection. There is hypoattenuation of the periventricular white matter, most commonly indicating chronic ischemic microangiopathy. Vascular: No abnormal hyperdensity of the major intracranial arteries or dural venous sinuses. No intracranial atherosclerosis. Skull: The visualized skull base, calvarium and extracranial soft tissues are normal. Sinuses/Orbits: No fluid levels or advanced mucosal thickening of the visualized paranasal sinuses. No mastoid or middle ear effusion. The orbits are normal. ASPECTS [facility] Stroke Program Early CT Score) - Ganglionic level infarction (caudate, lentiform nuclei, internal capsule, insula, M1-M3 cortex): 7 - Supraganglionic infarction (M4-M6 cortex): 3 Total score (0-10 with 10 being normal): 10 IMPRESSION: 1. Chronic ischemic microangiopathy and generalized atrophy without acute intracranial abnormality. 2. ASPECTS is 10. These results were called by telephone at the time of interpretation on 06/03/2021 at 10:07 pm to provider [facility] , who verbally acknowledged these results. Electronically Signed   By: Deatra Robinson M.D.   On: 06/03/2021 22:07   CT ANGIO NECK CODE STROKE  Result Date: 06/03/2021 CLINICAL DATA:  Right-sided weakness EXAM: CT ANGIOGRAPHY HEAD AND NECK TECHNIQUE: Multidetector CT imaging of the head and neck was performed using the  standard  protocol during bolus administration of intravenous contrast. Multiplanar CT image reconstructions and MIPs were obtained to evaluate the vascular anatomy. Carotid stenosis measurements (when applicable) are obtained utilizing NASCET criteria, using the distal internal carotid diameter as the denominator. CONTRAST:  OMNIPAQUE IOHEXOL 350 MG/ML SOLN COMPARISON:  11/14/2019 CTA head neck FINDINGS: CTA NECK FINDINGS SKELETON: There is no bony spinal canal stenosis. No lytic or blastic lesion. OTHER NECK: 2.0 cm left thyroid nodule. UPPER CHEST: No pneumothorax or pleural effusion. No nodules or masses. AORTIC ARCH: There is soft and calcific atherosclerosis of the aortic arch. There is no aneurysm, dissection or hemodynamically significant stenosis of the visualized portion of the aorta. Conventional 3 vessel aortic branching pattern. The visualized proximal subclavian arteries are widely patent. RIGHT CAROTID SYSTEM: Normal without aneurysm, dissection or stenosis. LEFT CAROTID SYSTEM: No dissection, occlusion or aneurysm. Mild atherosclerotic calcification at the carotid bifurcation without hemodynamically significant stenosis. VERTEBRAL ARTERIES: Left dominant configuration. Both origins are clearly patent. There is no dissection, occlusion or flow-limiting stenosis to the skull base (V1-V3 segments). CTA HEAD FINDINGS POSTERIOR CIRCULATION: --Vertebral arteries: Normal V4 segments. --Inferior cerebellar arteries: Normal. --Basilar artery: Normal. --Superior cerebellar arteries: Normal. --Posterior cerebral arteries (PCA): Normal. ANTERIOR CIRCULATION: --Intracranial internal carotid arteries: Normal. --Anterior cerebral arteries (ACA): Normal. Both A1 segments are present. Patent anterior communicating artery (a-comm). --Middle cerebral arteries (MCA): Normal. VENOUS SINUSES: As permitted by contrast timing, patent. ANATOMIC VARIANTS: None Review of the MIP images confirms the above findings. IMPRESSION: 1.  No emergent large vessel occlusion or high-grade stenosis of the intracranial or cervical arteries. 2. 2 cm left thyroid nodule. Recommend thyroid US (ref: J Am Coll Radiol. 2015 Feb;12(2): 143-50). Aortic Atherosclerosis (ICD10-I70.0). Electronically Signed   By: Deatra Robinson M.D.   On: 06/03/2021 22:33    Pending Labs Unresulted Labs (From admission, onward)   None      Vitals/Pain Today's Vitals   06/04/21 0515 06/04/21 0530 06/04/21 0545 06/04/21 0715  BP: (!) 178/86 (!) 166/74 (!) 159/98 (!) 130/97  Pulse:    88  Resp: 18 (!) 24 (!) 24 20  Temp:      TempSrc:      SpO2:    96%  Weight:      PainSc:        Isolation Precautions No active isolations  Medications Medications  iohexol (OMNIPAQUE) 350 MG/ML injection 100 mL (100 mLs Intravenous Contrast Given 06/03/21 2216)    Mobility walks Low fall risk   Focused Assessments Neuro Assessment Handoff:  Swallow screen pass? Yes    NIH Stroke Scale ( + Modified Stroke Scale Criteria)  Interval: Initial Level of Consciousness (1a.)   : Alert, keenly responsive LOC Questions (1b. )   +: Answers both questions correctly LOC Commands (1c. )   + : Performs both tasks correctly Best Gaze (2. )  +: Normal Visual (3. )  +: No visual loss Facial Palsy (4. )    : Normal symmetrical movements Motor Arm, Left (5a. )   +: No drift Motor Arm, Right (5b. )   +: (S) Drift (states it hurts to lift up, painful) Motor Leg, Left (6a. )   +: No drift Motor Leg, Right (6b. )   +: No drift Limb Ataxia (7. ): Absent Sensory (8. )   +: Normal, no sensory loss Best Language (9. )   +: No aphasia Dysarthria (10. ): Normal Extinction/Inattention (11.)   +: No Abnormality Modified SS Total  +:  1 Complete NIHSS TOTAL: 1     Neuro Assessment: Exceptions to WDL Neuro Checks:   Initial (06/04/21 0719)  Last Documented NIHSS Modified Score: 1 (06/04/21 0719) Has TPA been given? No If patient is a Neuro Trauma and patient is going to OR  before floor call report to 4N Charge nurse: [phone] or [phone]     R Recommendations: See Admitting Provider Note  Report given to:   Additional Notes:

## 2021-06-04 NOTE — Evaluation (Signed)
Speech Language Pathology Evaluation Patient Details Name: Denise Leon MRN: [id number] DOB: May 12, 1946 Today's Date: 06/04/2021 Time: 0814-4818 SLP Time Calculation (min) (ACUTE ONLY): 12 min  Problem List:  Patient Active Problem List   Diagnosis Date Noted   Hypocalcemia 06/04/2021   Hypertensive urgency 06/04/2021   History of CVA (cerebrovascular accident) 06/04/2021   Leukocytosis 06/04/2021   Acute right-sided weakness 06/03/2021   Palpitations 11/16/2019   Essential hypertension 11/16/2019   Hyperlipidemia 11/16/2019   CKD (chronic kidney disease), stage IIIa 11/16/2019   Thyroid nodule, L 11/16/2019   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   Stroke (HCC) 11/14/2019   Acute ischemic stroke (HCC) patchy L frontal infarcts s/p tPA 11/14/2019   Past Medical History:  Past Medical History:  Diagnosis Date   Acute ischemic stroke (HCC) patchy L frontal infarcts s/p tPA 11/14/2019   Asthma    CKD (chronic kidney disease), stage IIIb 11/16/2019   Hyperlipidemia 11/16/2019   Hypertension    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 female with a PMHx of 2021 Left patchy frontal CVA with tPA, CKD III, HTN, HLD, sensorineural hearing loss, back pain, palpitations s/p Loop recorder, asthma, Left MCA stroke, and thyroid nodule.  Head CT reported no acute abnormalities.  MRI pending.  Pt with a hx of mild short-term memory and complex problem solving impairments.   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  Of note, these deficits are baseline from previous CVA per chart review.  Pt reported that she  lives with her daughter, but that she is independent with IADLs including medications and finances.  Expressive and receptive language appeared functional, and no dysarthria was observed.  Recommend brief ST f/u targeting cognitive deficits and supervision with IADLs at time of discharge.    SLP Assessment  SLP Recommendation/Assessment: Patient needs continued Speech Lanaguage Pathology Services SLP Visit Diagnosis: Cognitive communication deficit (R41.841)    Follow Up Recommendations  None    Frequency and Duration min 1 x/week  1 week      SLP Evaluation Cognition  Overall Cognitive Status: History of cognitive impairments - at baseline Arousal/Alertness: Awake/alert Orientation Level: Oriented X4 Attention: Sustained Sustained Attention: Appears intact Immediate Memory Recall: Sock;Blue;Bed Awareness: Appears intact Problem Solving: Appears intact Safety/Judgment: Appears intact       Comprehension  Auditory Comprehension Overall Auditory Comprehension: Appears within functional limits for tasks assessed    Expression Expression Primary Mode of Expression: Verbal Verbal Expression Overall Verbal Expression: Appears within functional limits for tasks assessed   Oral / Motor  Oral Motor/Sensory Function Overall Oral Motor/Sensory Function: Within functional limits Motor Speech Overall Motor Speech: Appears within functional limits for tasks assessed   GO                   Villa Herb M.S., CCC-SLP Acute Rehabilitation Services Office: [phone]  Denise Leon 06/04/2021, 1:52 PM

## 2021-06-04 NOTE — Consult Note (Addendum)
Neurology Consultation  Reason for Consult: Increased right sided weakness, stroke vs recrudescence of old stroke.  Referring Physician: Arlyss Queen, MD.   CC: came in with increased right sided weakness over baseline since old stroke and facial numbness.   History is obtained from: chart, patient.   HPI: Denise Leon is a 75 y.o. [place] female with a PMHx of 2021 Left patchy frontal CVA with tPA, CKD III, HTN, HLD, sensorineural hearing loss, back pain, palpitations s/p Loop recorder, asthma, Left MCA stroke, and thyroid nodule. Patient came to ED yesterday with symptoms of increased RUE over what sequelae she had from her previous stroke and facial numbness.   Code stroke was activated and patient seen by tele neuro. Per note, low suspicion for new stroke and likely recrudescence of old stroke symptoms. No tPA was given and CTH was without acute finding. CTA head and neck without LVO.   Workup thus far revealed a negative UA, neg UDS, Na 137, INR 0.9, aPTT 30, and EKG without arrhythmia.   In review of chart, patient was placed on Plavix and ASA 325mg  on discharge 11/2019. She was supposed to drop Plavix and continue ASA only in 02/2020. Plavix is on her PTA list, but NP can not find notes to say it was continued anywhere. 325mg  ASA chosen due to LICA mixed plaques. NP spoke to patient's daughter. Patient has not seen a neurologist since last stroke, just PCP. However, daughter states she is taking both 325mg  ASA and Plavix and daughter thinks her PCP restarted the Plavix.   NP finds a referral for neurology at [facility], but no appointment note. Also, for her history of palpitations, she had an event monitor in 2021 which did not capture AF. A loop recorder was implanted by Dr. on 01/18/20, which in reviewing results, it would appear she had episodes of AF, but unsure because NP finds no out patient cardiology visit after 01/18/20. Per daughter, they have never been called because of an abnormal  finding of Loop, and it was checked just last month.   Patient states she no longer has numbness to face. RUE feels stronger, but she does not understand NP question inquiring as to if it is back to baseline post previous stroke. She endorses numbness and tingling to RUE, BLEs from toes to mid shin. She denies neuropathy/DM. States she feeds herself at home with both hands.   Neurology asked to consult due to increased RUE numbness and weakness over baseline.   ROS: A robust ROS was performed and is negative except as noted in the HPI. C/o left shoulder pain with movement, which is new, and has had no falls.   Past Medical History:  Diagnosis Date   Acute ischemic stroke (HCC) patchy L frontal infarcts s/p tPA 11/14/2019   Asthma    CKD (chronic kidney disease), stage IIIb 11/16/2019   Hyperlipidemia 11/16/2019   Hypertension    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   Palpitations 11/16/2019   Stroke (HCC) 11/14/2019   Thyroid nodule, L 11/16/2019    Family History  Problem Relation Age of Onset   Lung disease Mother     Social History:   reports that she has never smoked. She has never used smokeless tobacco. She reports that she does not drink alcohol and does not use drugs.  Medications  Current Facility-Administered Medications:     stroke: mapping our early stages of recovery book, , Does not apply, Once, 01/14/2020, MD  0.9 %  sodium chloride infusion, , Intravenous, Once, Smith, Rondell A, MD   acetaminophen (TYLENOL) tablet 650 mg, 650 mg, Oral, Q4H PRN **OR** acetaminophen (TYLENOL) 160 MG/5ML solution 650 mg, 650 mg, Per Tube, Q4H PRN **OR** acetaminophen (TYLENOL) suppository 650 mg, 650 mg, Rectal, Q4H PRN, Katrinka Blazing, Rondell A, MD   aspirin suppository 300 mg, 300 mg, Rectal, Daily **OR** aspirin tablet 325 mg, 325 mg, Oral, Daily, Smith, Rondell A, MD   calcium gluconate 1 g/ 50 mL sodium chloride IVPB, 1 g, Intravenous, Once, Smith, Rondell A, MD   enoxaparin  (LOVENOX) injection 40 mg, 40 mg, Subcutaneous, Q24H, Smith, Rondell A, MD   Exam: Current vital signs: BP (!) 153/79 (BP Location: Left Arm)   Pulse 71   Temp 97.9 F (36.6 C) (Oral)   Resp 16   Ht 5\' 2"  (1.575 m)   Wt 62.5 kg   SpO2 100%   BMI 25.20 kg/m  Vital signs in last 24 hours: Temp:  [97.9 F (36.6 C)-98.4 F (36.9 C)] 97.9 F (36.6 C) (08/21 0854) Pulse Rate:  [71-91] 71 (08/21 0854) Resp:  [15-24] 16 (08/21 0854) BP: (130-196)/(74-145) 153/79 (08/21 0854) SpO2:  [96 %-100 %] 100 % (08/21 0854) Weight:  [62.5 kg] 62.5 kg (08/21 1100)  PE: GENERAL: Well appearing female in NAD. Awake and alert.   HEENT: normocephalic and atraumatic. LUNGS - Normal respiratory effort.  CV - RRR on tele. ABDOMEN - Soft, nontender. Ext: warm, well perfused. Psych: affect light, calm.   NEURO:  Mental Status: Alert and oriented to name, age. When asked about place, city, and state, she replies 06-28-1986 and [place]. Knows year is 2022, but not day, date, or month.  Speech/Language: speech is without dysarthria or aphasia.  Naming, repetition, fluency, and comprehension intact.  Cranial Nerves:  II: PERRL  3 mm/brisk. visual fields full.  III, IV, VI: EOMI. Lid elevation symmetric and full.  V: sensation is intact and symmetrical to face. Blinks to threat. VII: Smile is symmetrical. Able to puff cheeks and raise eyebrows.  VIII:hearing intact to voice. IX, X: palate elevation is symmetric. Phonation normal.  XI: normal sternocleidomastoid and trapezius muscle strength. 2023 is symmetrical without fasciculations.   Motor:  RUE: grips  4/5       triceps 4-/5     biceps  4/5      LUE: grips  4+/5      triceps  5/5      biceps   4+/5 BLE 5/5 strength.  Tone is normal. Bulk is normal.  Sensation- Intact to light touch bilaterally in all four extremities. Extinction absent to DSS. Light touch is less on right UE and BLEs from toes to mid shin.  Coordination: FTN intact  bilaterally. HKS-does not understand. No pronator drift to LEs. RUE with slight drift but does not hit bed.   DTRs:  UE 2+, LE 1+ Gait- deferred.  NIHSS:  1a Level of Consciousness: 0 1b LOC Questions: 0 1c LOC Commands: 0 2 Best Gaze: 0 3 Visual: 0 4 Facial Palsy: 0 5a Motor Arm - left: 0 5b Motor Arm - Right: 1 6a Motor Leg - Left: 0 6b Motor Leg - Right: 0 7 Limb Ataxia: 0 8 Sensory: 0 9 Best Language: 0 10 Dysarthria: 0 11 Extinction and Inattention: 0 TOTAL: 1  Labs I have reviewed labs in epic and the results pertinent to this consultation are:  CBC    Component Value Date/Time   WBC 11.3 (  H) 06/03/2021 2200   RBC 3.98 06/03/2021 2200   HGB 12.2 06/03/2021 2200   HCT 35.9 (L) 06/03/2021 2200   PLT 287 06/03/2021 2200   MCV 90.2 06/03/2021 2200   MCH 30.7 06/03/2021 2200   MCHC 34.0 06/03/2021 2200   RDW 12.7 06/03/2021 2200   LYMPHSABS 3.2 06/03/2021 2200   MONOABS 1.0 06/03/2021 2200   EOSABS 0.3 06/03/2021 2200   BASOSABS 0.0 06/03/2021 2200   CMP     Component Value Date/Time   NA 137 06/03/2021 2200   K 3.8 06/03/2021 2200   CL 104 06/03/2021 2200   CO2 24 06/03/2021 2200   GLUCOSE 154 (H) 06/03/2021 2200   BUN 30 (H) 06/03/2021 2200   CREATININE 1.27 (H) 06/03/2021 2200   CALCIUM 8.5 (L) 06/03/2021 2200   PROT 6.4 (L) 06/03/2021 2200   ALBUMIN 3.5 06/03/2021 2200   AST 20 06/03/2021 2200   ALT 14 06/03/2021 2200   ALKPHOS 45 06/03/2021 2200   BILITOT 0.5 06/03/2021 2200   GFRNONAA 45 (L) 06/03/2021 2200   GFRAA 42 (L) 12/02/2019 0549   Imaging MD reviewed the images obtained.  CT head Chronic ischemic microangiopathy and generalized atrophy without acute intracranial abnormality. ASPECTS is 10.  CT head and neck No emergent large vessel occlusion or high-grade stenosis of the intracranial or cervical arteries.  Assessment: 75 yo female with history of stroke x 2 presenting with increased RUE strength over baseline from sequelae of  old stroke. Tele neuro thought acute stroke was less likely and favored recrudescence of old stroke symptoms. No tPA was given due to no disabling symptoms. No diagnosis of AF per daughter, so doubt previous strokes were embolic. Her NIH is 1 as it was on consult last pm. If she does not have a new stroke, it is unclear as to the reason for the recrudescence of past stroke symptoms. For numbness and tingling, will check TSH and B12 levels.   Impression: -New stroke vs. recrudescence of old stroke sequelae.  -Chronic RUE weakness.   Recommendations/Plan:  -Stroke w/up in progress.  -MRI brain without contrast.  -Echo is pending.  -Lipids, TSH, Vitamin B12, Hemoglobin A1c.  -Continue 80mg  Atorvastatin. Add Zetia if LDL > 70.  -A1c goal < 7.  -PT/OT.  -RN swallow screen.  -frequent neuro checks.  -stroke education.  -Continue ASA 325mg  po qd and Plavix for now -Continue telemetry to monitor for arrhyhtmia.   Pt seen by , NP/Neuro and later by MD. Note/plan to be edited by MD as needed.  Pager:  I have seen the patient reviewed the above note.  Her daughter reports that she has been significantly anxious over the past few days.  It is possible that this simply represents recrudescence of previous symptoms, but I suspect that she has extended her previous stroke.  Work-up as above.  Jimmye Norman, MD [facility] [phone]  If 7pm- 7am, please page neurology on call as listed in AMION.

## 2021-06-04 NOTE — Plan of Care (Signed)
Staff paged requesting patient's loop recorder to be interrogated, emailed EP office staff to assist this tomorrow.

## 2021-06-04 NOTE — Progress Notes (Signed)
Cath lab called back, interrogation is not done on weekend. Cath lab put in for tomorrow.

## 2021-06-04 NOTE — Evaluation (Signed)
Physical Therapy Evaluation Patient Details Name: Denise Leon MRN: [id number] DOB: Dec 20, 1945 Today's Date: 06/04/2021   History of Present Illness  This 75 y.o. female admitted with increased Rt sided weakness.  Work up underway with working diagnosis of new stroke vs. recrudescence.  CT of brain without acute infarct.  MRI of brain pending.  PMH includes CVA with Rt residual weakness, asthma, CKD, HTN, palpitations, hearing loss  Clinical Impression  Pt with limited response to questioning, difficult to tell whether due to cognition or hearing loss. Pt is also lethargic and minimally participatory, obvious frustration with questions and request for mobility.   Pt reports PTA pt lives with daughter who works in [employer] home with level entry where her bed and bath are downstairs. Pt reports she ambulates with RW and does not participate in community level ambulation often. Pt reports she bathes and dresses on her own but daughter provides for iADLs. Pt is currently limited in safe mobility by lethargy, R sided weakness UE>LE related decrease in balance and decreased endurance. Pt is min guard for bed mobility and min A for transfers and very short distance ambulation with RW. PT recommending SNF level rehab. PT will continue to follow acutely.     Follow Up Recommendations SNF    Equipment Recommendations  None recommended by PT       Precautions / Restrictions Precautions Precautions: Fall Restrictions Weight Bearing Restrictions: No      Mobility  Bed Mobility Overal bed mobility: Needs Assistance Bed Mobility: Supine to Sit;Sit to Supine     Supine to sit: Min guard Sit to supine: Min guard   General bed mobility comments: min guard for safety, increased effort and time to come to EoB, increased effort to bring LE back into bed    Transfers Overall transfer level: Needs assistance   Transfers: Sit to/from Stand Sit to Stand: Min assist         General transfer  comment: min A for power up and steadying, requires min A for bringing R hand to grasp rolling walker  Ambulation/Gait Ambulation/Gait assistance: Min assist Gait Distance (Feet): 5 Feet Assistive device: Rolling walker (2 wheeled) Gait Pattern/deviations: Step-through pattern;Decreased step length - right;Decreased step length - left;Decreased weight shift to right;Trunk flexed Gait velocity: slowed Gait velocity interpretation: <1.31 ft/sec, indicative of household ambulator General Gait Details: min A for steadying, with slow, shuffling gait, decreased weight shift to R, decreased ability to navigate RW despite multimodal cuing  pt becoming irritated with getting out of bed only agreeable to walk along side of bed, wants to return to bed         Balance Overall balance assessment: Needs assistance Sitting-balance support: Feet supported Sitting balance-Leahy Scale: Fair     Standing balance support: Single extremity supported Standing balance-Leahy Scale: Poor Standing balance comment: requires min A                             Pertinent Vitals/Pain Pain Assessment: No/denies pain    Home Living Family/patient expects to be discharged to:: Private residence Living Arrangements: Children Available Help at Discharge: Family Type of Home: House Home Access: Level entry     Home Layout: Two level;Able to live on main level with bedroom/bathroom   Additional Comments: info gleaned from previous admission.  Per chart review and pt input.  She lives with her daughter and reports her daughter works as a PT at [employer]  Prior Function Level of Independence: Independent with assistive device(s)         Comments: pt indicates she was able to complete ADLs Mod I.  Chart review indicates pt was able to ambulate 100 ft with RW, and required supervision for ADLs, including set up when she was discharged from [facility] 11/2019     Hand Dominance   Dominant Hand:  Right    Extremity/Trunk Assessment   Upper Extremity Assessment Upper Extremity Assessment: Defer to OT evaluation RUE Deficits / Details: Pt demonstrates ~55* shoulder flexion with mass grasp/release of Rt hand.  Pt unable to tell me if this UE is worse than her normal. RUE Coordination: decreased fine motor;decreased gross motor LUE Deficits / Details: Per chart review, pt with h/o frozen shoulder.  Pt demonstrates ~60* Shoulder flexion.  Elbow distally grossly WFL    Lower Extremity Assessment Lower Extremity Assessment: RLE deficits/detail;LLE deficits/detail RLE Deficits / Details: AAROM hip/ knee and ankle, hip grossly 2/5, knee 3/5 and ankle3+/5 LLE Deficits / Details: AROM WFL, strength grossly 4/5       Communication   Communication: HOH (Pt speaks English, but has significant hearing loss)  Cognition Arousal/Alertness: Lethargic Behavior During Therapy: Flat affect Overall Cognitive Status: No family/caregiver present to determine baseline cognitive functioning                                 General Comments: Based on notes from CIR at time of CVA, pt was able to selectively attend to tasks ~ 30 mins and was able to alternate attention.  This date, pt was lethargic.  She was able to sustain attention to task for up to 5 mins.  She required cues for problem solving and sequencing      General Comments General comments (skin integrity, edema, etc.): VSS on RA, pt with constant yawning throughout session, and eager to return to bed        Assessment/Plan    PT Assessment Patient needs continued PT services  PT Problem List Decreased strength;Decreased range of motion;Decreased activity tolerance;Decreased balance;Decreased mobility;Decreased coordination;Decreased safety awareness       PT Treatment Interventions DME instruction;Gait training;Functional mobility training;Therapeutic activities;Therapeutic exercise;Balance training;Cognitive  remediation;Patient/family education    PT Goals (Current goals can be found in the Care Plan section)  Acute Rehab PT Goals Patient Stated Goal: did not state PT Goal Formulation: Patient unable to participate in goal setting Time For Goal Achievement: 06/18/21 Potential to Achieve Goals: Fair    Frequency Min 3X/week   Barriers to discharge        Co-evaluation               AM-PAC PT "6 Clicks" Mobility  Outcome Measure Help needed turning from your back to your side while in a flat bed without using bedrails?: None Help needed moving from lying on your back to sitting on the side of a flat bed without using bedrails?: None Help needed moving to and from a bed to a chair (including a wheelchair)?: A Little Help needed standing up from a chair using your arms (e.g., wheelchair or bedside chair)?: A Little Help needed to walk in hospital room?: A Little Help needed climbing 3-5 steps with a railing? 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 Beverely Risen PT, DPT Acute Rehabilitation Services Pager [phone] Office [phone]   Elon Alas Fleet 06/04/2021, 5:15 PM

## 2021-06-04 NOTE — Progress Notes (Signed)
Paged cardiology to have loop recorder interrogated. Awaiting call back.

## 2021-06-04 NOTE — Plan of Care (Signed)

## 2021-06-04 NOTE — ED Notes (Signed)
[facility] at bedside 

## 2021-06-05 ENCOUNTER — Observation Stay (HOSPITAL_COMMUNITY): Payer: Medicare Other

## 2021-06-05 DIAGNOSIS — I129 Hypertensive chronic kidney disease with stage 1 through stage 4 chronic kidney disease, or unspecified chronic kidney disease: Secondary | ICD-10-CM | POA: Diagnosis present

## 2021-06-05 DIAGNOSIS — I6389 Other cerebral infarction: Secondary | ICD-10-CM

## 2021-06-05 DIAGNOSIS — I6300 Cerebral infarction due to thrombosis of unspecified precerebral artery: Secondary | ICD-10-CM | POA: Diagnosis not present

## 2021-06-05 DIAGNOSIS — I160 Hypertensive urgency: Secondary | ICD-10-CM | POA: Diagnosis present

## 2021-06-05 DIAGNOSIS — E041 Nontoxic single thyroid nodule: Secondary | ICD-10-CM | POA: Diagnosis present

## 2021-06-05 DIAGNOSIS — I69351 Hemiplegia and hemiparesis following cerebral infarction affecting right dominant side: Secondary | ICD-10-CM

## 2021-06-05 DIAGNOSIS — E782 Mixed hyperlipidemia: Secondary | ICD-10-CM

## 2021-06-05 DIAGNOSIS — Y838 Other surgical procedures as the cause of abnormal reaction of the patient, or of later complication, without mention of misadventure at the time of the procedure: Secondary | ICD-10-CM | POA: Diagnosis not present

## 2021-06-05 DIAGNOSIS — Z006 Encounter for examination for normal comparison and control in clinical research program: Secondary | ICD-10-CM | POA: Diagnosis not present

## 2021-06-05 DIAGNOSIS — I63232 Cerebral infarction due to unspecified occlusion or stenosis of left carotid arteries: Secondary | ICD-10-CM | POA: Diagnosis present

## 2021-06-05 DIAGNOSIS — R29810 Facial weakness: Secondary | ICD-10-CM | POA: Diagnosis present

## 2021-06-05 DIAGNOSIS — I7389 Other specified peripheral vascular diseases: Secondary | ICD-10-CM | POA: Diagnosis present

## 2021-06-05 DIAGNOSIS — N179 Acute kidney failure, unspecified: Secondary | ICD-10-CM | POA: Diagnosis present

## 2021-06-05 DIAGNOSIS — M545 Low back pain, unspecified: Secondary | ICD-10-CM | POA: Diagnosis present

## 2021-06-05 DIAGNOSIS — I639 Cerebral infarction, unspecified: Secondary | ICD-10-CM | POA: Diagnosis present

## 2021-06-05 DIAGNOSIS — L7632 Postprocedural hematoma of skin and subcutaneous tissue following other procedure: Secondary | ICD-10-CM | POA: Diagnosis not present

## 2021-06-05 DIAGNOSIS — I63412 Cerebral infarction due to embolism of left middle cerebral artery: Secondary | ICD-10-CM | POA: Diagnosis not present

## 2021-06-05 DIAGNOSIS — D72829 Elevated white blood cell count, unspecified: Secondary | ICD-10-CM | POA: Diagnosis present

## 2021-06-05 DIAGNOSIS — R350 Frequency of micturition: Secondary | ICD-10-CM | POA: Diagnosis present

## 2021-06-05 DIAGNOSIS — R2990 Unspecified symptoms and signs involving the nervous system: Secondary | ICD-10-CM | POA: Diagnosis not present

## 2021-06-05 DIAGNOSIS — F0390 Unspecified dementia without behavioral disturbance: Secondary | ICD-10-CM | POA: Diagnosis present

## 2021-06-05 DIAGNOSIS — D62 Acute posthemorrhagic anemia: Secondary | ICD-10-CM | POA: Diagnosis not present

## 2021-06-05 DIAGNOSIS — Z8673 Personal history of transient ischemic attack (TIA), and cerebral infarction without residual deficits: Secondary | ICD-10-CM | POA: Diagnosis not present

## 2021-06-05 DIAGNOSIS — R29701 NIHSS score 1: Secondary | ICD-10-CM | POA: Diagnosis present

## 2021-06-05 DIAGNOSIS — I700 Atherosclerosis of aorta: Secondary | ICD-10-CM | POA: Diagnosis present

## 2021-06-05 DIAGNOSIS — Z20822 Contact with and (suspected) exposure to covid-19: Secondary | ICD-10-CM | POA: Diagnosis present

## 2021-06-05 DIAGNOSIS — R531 Weakness: Secondary | ICD-10-CM | POA: Diagnosis present

## 2021-06-05 DIAGNOSIS — E876 Hypokalemia: Secondary | ICD-10-CM | POA: Diagnosis not present

## 2021-06-05 DIAGNOSIS — I6522 Occlusion and stenosis of left carotid artery: Secondary | ICD-10-CM

## 2021-06-05 DIAGNOSIS — K5900 Constipation, unspecified: Secondary | ICD-10-CM | POA: Diagnosis not present

## 2021-06-05 DIAGNOSIS — H905 Unspecified sensorineural hearing loss: Secondary | ICD-10-CM | POA: Diagnosis present

## 2021-06-05 DIAGNOSIS — N1831 Chronic kidney disease, stage 3a: Secondary | ICD-10-CM | POA: Diagnosis present

## 2021-06-05 DIAGNOSIS — G8929 Other chronic pain: Secondary | ICD-10-CM | POA: Diagnosis present

## 2021-06-05 DIAGNOSIS — G8191 Hemiplegia, unspecified affecting right dominant side: Secondary | ICD-10-CM | POA: Diagnosis present

## 2021-06-05 DIAGNOSIS — E785 Hyperlipidemia, unspecified: Secondary | ICD-10-CM | POA: Diagnosis present

## 2021-06-05 LAB — ECHOCARDIOGRAM COMPLETE
Area-P 1/2: 3.93 cm2
Height: 62 in
S' Lateral: 2.9 cm
Weight: 2204.6 oz

## 2021-06-05 LAB — BASIC METABOLIC PANEL
Anion gap: 9 (ref 5–15)
BUN: 26 mg/dL — ABNORMAL HIGH (ref 8–23)
CO2: 23 mmol/L (ref 22–32)
Calcium: 8.7 mg/dL — ABNORMAL LOW (ref 8.9–10.3)
Chloride: 105 mmol/L (ref 98–111)
Creatinine, Ser: 1.1 mg/dL — ABNORMAL HIGH (ref 0.44–1.00)
GFR, Estimated: 53 mL/min — ABNORMAL LOW (ref >60)
Glucose, Bld: 110 mg/dL — ABNORMAL HIGH (ref 70–99)
Potassium: 3.7 mmol/L (ref 3.5–5.1)
Sodium: 137 mmol/L (ref 135–145)

## 2021-06-05 MED ORDER — SODIUM CHLORIDE 0.9 % IV SOLN
INTRAVENOUS | Status: DC
  Administered 2021-06-05: 1000 mL via INTRAVENOUS

## 2021-06-05 MED ORDER — CLOPIDOGREL BISULFATE 75 MG PO TABS
75.0000 mg | ORAL_TABLET | Freq: Every day | ORAL | Status: DC
  Administered 2021-06-05 – 2021-06-10 (×5): 75 mg via ORAL
  Filled 2021-06-05 (×5): qty 1

## 2021-06-05 MED ORDER — VITAMIN B-12 1000 MCG PO TABS
1000.0000 ug | ORAL_TABLET | Freq: Every day | ORAL | Status: DC
  Administered 2021-06-06 – 2021-06-10 (×4): 1000 ug via ORAL
  Filled 2021-06-05 (×4): qty 1

## 2021-06-05 MED ORDER — CYANOCOBALAMIN 1000 MCG/ML IJ SOLN
1000.0000 ug | Freq: Once | INTRAMUSCULAR | Status: AC
  Administered 2021-06-05: 1000 ug via INTRAMUSCULAR
  Filled 2021-06-05: qty 1

## 2021-06-05 NOTE — Progress Notes (Addendum)
Inpatient Rehabilitation Admissions Coordinator   Inpatient rehab consult received. I spoke with patient's daughter by phone. Patient was at [facility] 2/21 and did well before returning home with daughter and her family who can provide 24/7 supervision . I discussed goals and expectations of a CIR admit and she is in agreement. I await medical workup completion and feel she is a good candidate for admit to CIR. Noted planned surgery on Thursday per Dr Randie Heinz.  Ottie Glazier, RN, MSN Rehab Admissions Coordinator [phone] 06/05/2021 4:36 PM

## 2021-06-05 NOTE — Progress Notes (Signed)
Inpatient Rehab Admissions Coordinator:   Per therapy recommendations,  patient was screened for CIR candidacy by Daveon Arpino, MS, CCC-SLP . At this time, Pt. Appears to demonstrate medical necessity, functional decline, and ability to tolerate intensity of CIR. Pt. is a potential candidate for CIR. I will place   order for rehab consult per protocol for full assessment. Please contact me any with questions..  Madilynne Mullan, MS, CCC-SLP Rehab Admissions Coordinator  [phone] (celll) [phone] (office)  

## 2021-06-05 NOTE — Progress Notes (Signed)
PROGRESS NOTE   Denise Leon  SJG:[id number] DOB: 1946-02-28 DOA: 06/03/2021 PCP: Patient, No Pcp Per (Inactive)  Brief Narrative:  75 year old community dwelling female-PCP [facility] in [place] Known chronic lumbago HTN, thyroid nodule, CKD 3 RL cryptogenic stroke aspirin and Plavix status post tPA 11/04/2019-event recorder = high-grade baseline ectopy without A. fib-status post [employer] loop recorder 01/18/2020--after that inpatient admission patient went to inpatient rehab until 12/04/2019 and was discharged home  Developed sudden R sided weakness 05/2019 lasting 15 to 20 minutes-came to [facility] ED transferred to [facility] found to be 1 TSH B12 ordered by neurologist MRI brain showed Pathcy acute isch infracts L Parieto-occip--no hemorrhage A1C 5.8, LDL 131 Wbc 13, CXR no PNA  Hospital-Problem based course  Left ischemic acute infarcts Usual stroke pathway , follow echo--rest as per neuro PPM may need to be interrogated? Appreciate Dr. Pascal Lux input regarding endarterectomy versus at the procedure HTN Allow temporary elevated blood pressure with aim to control below 140/90, continue saline 75 cc/H, hydralazine 10 mg every 4 as needed systolic >220--Coreg 6.25 twice daily held, amlodipine 5 daily held Baseline CKD 3 Monitor labs a.m. Underlying mild dementia Patient does not recall ever being told she has heart arrhythmia that she has had a prior stroke   DVT prophylaxis: Plavix Lovenox Code Status: Presumed full Family Communication: None present Disposition:  Status is: Observation  The patient will require care spanning > 2 midnights and should be moved to inpatient because: Hemodynamically unstable, Persistent severe electrolyte disturbances, and Ongoing diagnostic testing needed not appropriate for outpatient work up  Dispo: The patient is from: Home              Anticipated d/c is to:  Unclear at this time              Patient currently is not medically stable  to d/c.   Difficult to place patient No       Consultants:  Neurologist Vascular surgery  Procedures: Multiple  Antimicrobials: None   Subjective: Awake alert speaks clear English seems to understand but does not remember certain things Interviewed in any case with interpreter No overt pain somewhat weak on the right side with some past-pointing Otherwise no great deficits No fever no chills no nausea no vomiting  Objective: Vitals:   06/05/21 0022 06/05/21 0458 06/05/21 0742 06/05/21 1100  BP: (!) 153/84 (!) 161/91 (!) 143/98 (!) 153/91  Pulse: 82 78 89 82  Resp: 17 19 17 18   Temp: (!) 97.5 F (36.4 C) 97.9 F (36.6 C) 98.9 F (37.2 C) 98.9 F (37.2 C)  TempSrc: Oral Oral Oral Oral  SpO2: 98% 98% 96% 99%  Weight:      Height:        Intake/Output Summary (Last 24 hours) at 06/05/2021 1345 Last data filed at 06/04/2021 1510 Gross per 24 hour  Intake 50 ml  Output --  Net 50 ml   Filed Weights   06/03/21 2222 06/04/21 1100  Weight: 62.5 kg 62.5 kg    Examination:  Awake coherent vision by direct confrontation intact finger-nose-finger intact on left side on right side also intact but more hesitant Cannot shoulder shrug on the right side she is also weaker and has only 4/5 power to extension flexion of the shoulder biceps and triceps Reflexes deferred ROM intact grossly in other extremities sensory intact Pleasant coherent   Data Reviewed: personally reviewed   CBC    Component Value Date/Time   WBC  13.4 (H) 06/04/2021 1141   RBC 4.33 06/04/2021 1141   HGB 13.3 06/04/2021 1141   HCT 38.9 06/04/2021 1141   PLT 308 06/04/2021 1141   MCV 89.8 06/04/2021 1141   MCH 30.7 06/04/2021 1141   MCHC 34.2 06/04/2021 1141   RDW 12.6 06/04/2021 1141   LYMPHSABS 2.6 06/04/2021 1141   MONOABS 1.1 (H) 06/04/2021 1141   EOSABS 0.1 06/04/2021 1141   BASOSABS 0.0 06/04/2021 1141   CMP Latest Ref Rng & Units 06/05/2021 06/04/2021 06/03/2021  Glucose 70 - 99 mg/dL  161(W110(H) 960(A113(H) 540(J154(H)  BUN 8 - 23 mg/dL 81(X26(H) 20 91(Y30(H)  Creatinine 0.44 - 1.00 mg/dL 7.82(N1.10(H) 5.621.00 1.30(Q1.27(H)  Sodium 135 - 145 mmol/L 137 137 137  Potassium 3.5 - 5.1 mmol/L 3.7 3.9 3.8  Chloride 98 - 111 mmol/L 105 103 104  CO2 22 - 32 mmol/L 23 25 24   Calcium 8.9 - 10.3 mg/dL 6.5(H8.7(L) 8.4(O8.8(L) 9.6(E8.5(L)  Total Protein 6.5 - 8.1 g/dL - - 6.4(L)  Total Bilirubin 0.3 - 1.2 mg/dL - - 0.5  Alkaline Phos 38 - 126 U/L - - 45  AST 15 - 41 U/L - - 20  ALT 0 - 44 U/L - - 14     Radiology Studies: CT Angio Head W or Wo Contrast  Result Date: 06/03/2021 CLINICAL DATA:  Right-sided weakness EXAM: CT ANGIOGRAPHY HEAD AND NECK TECHNIQUE: Multidetector CT imaging of the head and neck was performed using the standard protocol during bolus administration of intravenous contrast. Multiplanar CT image reconstructions and MIPs were obtained to evaluate the vascular anatomy. Carotid stenosis measurements (when applicable) are obtained utilizing NASCET criteria, using the distal internal carotid diameter as the denominator. CONTRAST:  100mL OMNIPAQUE IOHEXOL 350 MG/ML SOLN COMPARISON:  11/14/2019 CTA head neck FINDINGS: CTA NECK FINDINGS SKELETON: There is no bony spinal canal stenosis. No lytic or blastic lesion. OTHER NECK: 2.0 cm left thyroid nodule. UPPER CHEST: No pneumothorax or pleural effusion. No nodules or masses. AORTIC ARCH: There is soft and calcific atherosclerosis of the aortic arch. There is no aneurysm, dissection or hemodynamically significant stenosis of the visualized portion of the aorta. Conventional 3 vessel aortic branching pattern. The visualized proximal subclavian arteries are widely patent. RIGHT CAROTID SYSTEM: Normal without aneurysm, dissection or stenosis. LEFT CAROTID SYSTEM: No dissection, occlusion or aneurysm. Mild atherosclerotic calcification at the carotid bifurcation without hemodynamically significant stenosis. VERTEBRAL ARTERIES: Left dominant configuration. Both origins are clearly  patent. There is no dissection, occlusion or flow-limiting stenosis to the skull base (V1-V3 segments). CTA HEAD FINDINGS POSTERIOR CIRCULATION: --Vertebral arteries: Normal V4 segments. --Inferior cerebellar arteries: Normal. --Basilar artery: Normal. --Superior cerebellar arteries: Normal. --Posterior cerebral arteries (PCA): Normal. ANTERIOR CIRCULATION: --Intracranial internal carotid arteries: Normal. --Anterior cerebral arteries (ACA): Normal. Both A1 segments are present. Patent anterior communicating artery (a-comm). --Middle cerebral arteries (MCA): Normal. VENOUS SINUSES: As permitted by contrast timing, patent. ANATOMIC VARIANTS: None Review of the MIP images confirms the above findings. IMPRESSION: 1. No emergent large vessel occlusion or high-grade stenosis of the intracranial or cervical arteries. 2. 2 cm left thyroid nodule. Recommend thyroid US (ref: J Am Coll Radiol. 2015 Feb;12(2): 143-50). Aortic Atherosclerosis (ICD10-I70.0). Electronically Signed   By: Deatra RobinsonKevin  Herman M.D.   On: 06/03/2021 22:33   MR BRAIN WO CONTRAST  Result Date: 06/05/2021 CLINICAL DATA:  Follow-up examination for acute stroke. EXAM: MRI HEAD WITHOUT CONTRAST TECHNIQUE: Multiplanar, multiecho pulse sequences of the brain and surrounding structures were obtained without intravenous contrast. COMPARISON:  Prior CT  from 06/03/2021. FINDINGS: Brain: Diffuse prominence of the CSF containing spaces compatible generalized age-related cerebral atrophy. Patchy and confluent T2/FLAIR hyperintensity within the periventricular and deep white matter both cerebral hemispheres most consistent with chronic small vessel ischemic disease, moderate to advanced in nature. Few scattered superimposed remote lacunar infarcts noted about the left hemispheric cerebral white matter. Patchy areas of restricted diffusion seen involving the cortical and subcortical aspect of the left parieto-occipital region, consistent with acute ischemic infarcts  (series 3, images 42-30). Extension to involve the left periatrial white matter. Associated susceptibility artifact consistent with petechial hemorrhage without frank intraparenchymal hematoma or hemorrhagic transformation. No significant regional mass effect. No other evidence for acute or subacute ischemia. Gray-white matter differentiation otherwise maintained. No other areas of remote cortical infarction. Few additional chronic micro hemorrhages noted, nonspecific, but likely small vessel related/hypertensive in nature. No mass lesion, midline shift or mass effect. No hydrocephalus or extra-axial fluid collection. Partially empty sella noted. Suprasellar region normal. Midline structures intact. Vascular: Major intracranial vascular flow voids are maintained. Skull and upper cervical spine: Craniocervical junction within normal limits. Bone marrow signal intensity normal. No scalp soft tissue abnormality. Sinuses/Orbits: Patient status post bilateral ocular lens replacement. Paranasal sinuses are clear. Chronic appearing left mastoid effusion noted. Other: None. IMPRESSION: 1. Patchy acute ischemic infarcts involving the left parieto-occipital region as above. Associated petechial hemorrhage without frank intraparenchymal hematoma. No significant regional mass effect. 2. Underlying age-related cerebral atrophy with moderate to advanced chronic microvascular ischemic disease. Electronically Signed   By: Rise Mu M.D.   On: 06/05/2021 00:03   DG CHEST PORT 1 VIEW  Result Date: 06/04/2021 CLINICAL DATA:  TIA EXAM: PORTABLE CHEST 1 VIEW COMPARISON:  11/03/2019 FINDINGS: Heart size within normal limits. Implanted cardiac device is noted overlying the left chest. No pulmonary vascular congestion. Lungs are clear. Advanced degenerative changes of the shoulder. IMPRESSION: No acute cardiopulmonary process. Electronically Signed   By: Acquanetta Belling M.D.   On: 06/04/2021 12:21   ECHOCARDIOGRAM  COMPLETE  Result Date: 06/05/2021    ECHOCARDIOGRAM REPORT   Patient Name:   Denise Leon Date of Exam: 06/05/2021 Medical Rec #:  [id number]    Height:       62.0 in Accession #:    [id number]   Weight:       137.8 lb Date of Birth:  06/18/1946   BSA:          1.632 m Patient Age:    74 years     BP:           161/91 mmHg Patient Gender: F            HR:           78 bpm. Exam Location:  Inpatient Procedure: 2D Echo, Color Doppler and Cardiac Doppler Indications:    TIA  History:        Patient has prior history of Echocardiogram examinations, most                 recent 11/14/2019. Risk Factors:Hypertension and Dyslipidemia.  Sonographer:    Irving Burton Senior RDCS Referring Phys: [phone] RONDELL A SMITH  Sonographer Comments: Technically difficult study, patient unable to follow commands to turn or breath hold. IMPRESSIONS  1. Left ventricular ejection fraction, by estimation, is 55 to 60%. The left ventricle has normal function. The left ventricle has no regional wall motion abnormalities. There is mild left ventricular hypertrophy. Left ventricular diastolic parameters are consistent with Grade I diastolic  dysfunction (impaired relaxation).  2. Right ventricular systolic function is normal. The right ventricular size is normal. There is normal pulmonary artery systolic pressure. The estimated right ventricular systolic pressure is 20.3 mmHg.  3. The mitral valve is normal in structure. No evidence of mitral valve regurgitation. No evidence of mitral stenosis.  4. The aortic valve is tricuspid. Aortic valve regurgitation is not visualized. No aortic stenosis is present.  5. Aortic dilatation noted. 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  6. The inferior vena cava is normal in size with greater than 50% respiratory variability, suggesting right atrial pressure of 3 mmHg. FINDINGS  Left Ventricle: Left ventricular ejection fraction, by estimation, is 55 to 60%. The left ventricle has normal  function. The left ventricle has no regional wall motion abnormalities. The left ventricular internal cavity size was normal in size. There is  mild left ventricular hypertrophy. Left ventricular diastolic parameters are consistent with Grade I diastolic dysfunction (impaired relaxation). Right Ventricle: The right ventricular size is normal. No increase in right ventricular wall thickness. Right ventricular systolic function is normal. There is normal pulmonary artery systolic pressure. The tricuspid regurgitant velocity is 2.08 m/s, and  with an assumed right atrial pressure of 3 mmHg, the estimated right ventricular systolic pressure is 20.3 mmHg. Left Atrium: Left atrial size was normal in size. Right Atrium: Right atrial size was normal in size. Pericardium: There is no evidence of pericardial effusion. Presence of pericardial fat pad. Mitral Valve: The mitral valve is normal in structure. No evidence of mitral valve regurgitation. No evidence of mitral valve stenosis. Tricuspid Valve: The tricuspid valve is normal in structure. Tricuspid valve regurgitation is trivial. Aortic Valve: The aortic valve is tricuspid. Aortic valve regurgitation is not visualized. No aortic stenosis is present. Pulmonic Valve: The pulmonic valve was not well visualized. Pulmonic valve regurgitation is trivial. Aorta: The aortic root is normal in size and structure and aortic dilatation noted. 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 Venous: The inferior vena cava is normal in size with greater than 50% respiratory variability, suggesting right atrial pressure of 3 mmHg. IAS/Shunts: The interatrial septum was not well visualized.  LEFT VENTRICLE PLAX 2D LVIDd:         4.40 cm  Diastology LVIDs:         2.90 cm  LV e' medial:    4.57 cm/s LV PW:         1.10 cm  LV E/e' medial:  12.1 LV IVS:        0.90 cm  LV e' lateral:   6.42 cm/s LVOT diam:     2.00 cm  LV E/e' lateral: 8.6 LV SV:         46 LV SV Index:    28 LVOT Area:     3.14 cm  RIGHT VENTRICLE RV S prime:     16.10 cm/s LEFT ATRIUM             Index       RIGHT ATRIUM           Index LA diam:        2.80 cm 1.72 cm/m  RA Area:     11.10 cm LA Vol (A2C):   34.9 ml 21.39 ml/m RA Volume:   22.90 ml  14.03 ml/m LA Vol (A4C):   50.0 ml 30.64 ml/m LA Biplane Vol: 44.7 ml 27.39 ml/m  AORTIC VALVE LVOT Vmax:  77.70 cm/s LVOT Vmean:  63.300 cm/s LVOT VTI:    0.147 m  AORTA Ao Root diam: 3.50 cm Ao Asc diam:  3.70 cm MITRAL VALVE               TRICUSPID VALVE MV Area (PHT): 3.93 cm    TR Peak grad:   17.3 mmHg MV Decel Time: 193 msec    TR Vmax:        208.00 cm/s MV E velocity: 55.30 cm/s MV A velocity: 81.80 cm/s  SHUNTS MV E/A ratio:  0.68        Systemic VTI:  0.15 m                            Systemic Diam: 2.00 cm Epifanio Lesches MD Electronically signed by Epifanio Lesches MD Signature Date/Time: 06/05/2021/10:01:05 AM    Final    CT HEAD CODE STROKE WO CONTRAST  Result Date: 06/03/2021 CLINICAL DATA:  Code stroke.  Right-sided weakness EXAM: CT HEAD WITHOUT CONTRAST TECHNIQUE: Contiguous axial images were obtained from the base of the skull through the vertex without intravenous contrast. COMPARISON:  None. FINDINGS: Brain: There is no mass, hemorrhage or extra-axial collection. There is generalized atrophy without lobar predilection. There is hypoattenuation of the periventricular white matter, most commonly indicating chronic ischemic microangiopathy. Vascular: No abnormal hyperdensity of the major intracranial arteries or dural venous sinuses. No intracranial atherosclerosis. Skull: The visualized skull base, calvarium and extracranial soft tissues are normal. Sinuses/Orbits: No fluid levels or advanced mucosal thickening of the visualized paranasal sinuses. No mastoid or middle ear effusion. The orbits are normal. ASPECTS [facility] Stroke Program Early CT Score) - Ganglionic level infarction (caudate, lentiform nuclei, internal capsule,  insula, M1-M3 cortex): 7 - Supraganglionic infarction (M4-M6 cortex): 3 Total score (0-10 with 10 being normal): 10 IMPRESSION: 1. Chronic ischemic microangiopathy and generalized atrophy without acute intracranial abnormality. 2. ASPECTS is 10. These results were called by telephone at the time of interpretation on 06/03/2021 at 10:07 pm to provider [facility] , who verbally acknowledged these results. Electronically Signed   By: Deatra Robinson M.D.   On: 06/03/2021 22:07   CT ANGIO NECK CODE STROKE  Result Date: 06/03/2021 CLINICAL DATA:  Right-sided weakness EXAM: CT ANGIOGRAPHY HEAD AND NECK TECHNIQUE: Multidetector CT imaging of the head and neck was performed using the standard protocol during bolus administration of intravenous contrast. Multiplanar CT image reconstructions and MIPs were obtained to evaluate the vascular anatomy. Carotid stenosis measurements (when applicable) are obtained utilizing NASCET criteria, using the distal internal carotid diameter as the denominator. CONTRAST:  OMNIPAQUE IOHEXOL 350 MG/ML SOLN COMPARISON:  11/14/2019 CTA head neck FINDINGS: CTA NECK FINDINGS SKELETON: There is no bony spinal canal stenosis. No lytic or blastic lesion. OTHER NECK: 2.0 cm left thyroid nodule. UPPER CHEST: No pneumothorax or pleural effusion. No nodules or masses. AORTIC ARCH: There is soft and calcific atherosclerosis of the aortic arch. There is no aneurysm, dissection or hemodynamically significant stenosis of the visualized portion of the aorta. Conventional 3 vessel aortic branching pattern. The visualized proximal subclavian arteries are widely patent. RIGHT CAROTID SYSTEM: Normal without aneurysm, dissection or stenosis. LEFT CAROTID SYSTEM: No dissection, occlusion or aneurysm. Mild atherosclerotic calcification at the carotid bifurcation without hemodynamically significant stenosis. VERTEBRAL ARTERIES: Left dominant configuration. Both origins are clearly patent. There is no  dissection, occlusion or flow-limiting stenosis to the skull base (V1-V3 segments). CTA HEAD FINDINGS POSTERIOR CIRCULATION: --  Vertebral arteries: Normal V4 segments. --Inferior cerebellar arteries: Normal. --Basilar artery: Normal. --Superior cerebellar arteries: Normal. --Posterior cerebral arteries (PCA): Normal. ANTERIOR CIRCULATION: --Intracranial internal carotid arteries: Normal. --Anterior cerebral arteries (ACA): Normal. Both A1 segments are present. Patent anterior communicating artery (a-comm). --Middle cerebral arteries (MCA): Normal. VENOUS SINUSES: As permitted by contrast timing, patent. ANATOMIC VARIANTS: None Review of the MIP images confirms the above findings. IMPRESSION: 1. No emergent large vessel occlusion or high-grade stenosis of the intracranial or cervical arteries. 2. 2 cm left thyroid nodule. Recommend thyroid US (ref: J Am Coll Radiol. 2015 Feb;12(2): 143-50). Aortic Atherosclerosis (ICD10-I70.0). Electronically Signed   By: Deatra Robinson M.D.   On: 06/03/2021 22:33     Scheduled Meds:  aspirin  300 mg Rectal Daily   Or   aspirin  325 mg Oral Daily   atorvastatin  80 mg Oral Daily   clopidogrel  75 mg Oral Daily   DULoxetine  30 mg Oral Daily   enoxaparin (LOVENOX) injection  40 mg Subcutaneous Q24H   latanoprost  1 drop Both Eyes QHS   [START ON 06/06/2021] vitamin B-12  1,000 mcg Oral Daily   Continuous Infusions:  sodium chloride 75 mL/hr at 06/05/21 0906     LOS: 0 days   Time spent: 15  Rhetta Mura, MD [facility] To contact the attending provider between 7A-7P or the covering provider during after hours 7P-7A, please log into the web site [email or web address] and access using universal [place] password for that web site. If you do not have the password, please call the hospital operator.  06/05/2021, 1:45 PM

## 2021-06-05 NOTE — Progress Notes (Signed)
Physical Therapy Treatment Patient Details Name: Denise Leon MRN: [id number] DOB: [birth year] Today's Date: 06/05/2021    History of Present Illness This 75 y.o. female admitted with increased Rt sided weakness.  Work up underway with working diagnosis of new stroke vs. recrudescence.  MRI showed acute L CVA in parieto-occipital region.   PMH includes CVA with Rt residual weakness, asthma, CKD, HTN, palpitations    PT Comments    Pt received in bed eating breakfast. Pt agreeable to mobility and participative today. Pt required min A to power up to standing from bed and BSC and takes increased time and effort. Pt needed tactile cues for sequencing with transfers. Pt ambulated 20' with min A and RW with B shuffling, R>L. Unsafe with turning. Pt performed standing balance exercises and finished in chair. Changing rec to CIR and updating frequency accordingly as pt has good potential to return to PLOF and good family support. PT will continue to follow.    Follow Up Recommendations  CIR     Equipment Recommendations  None recommended by PT    Recommendations for Other Services Rehab consult     Precautions / Restrictions Precautions Precautions: Fall Restrictions Weight Bearing Restrictions: No    Mobility  Bed Mobility Overal bed mobility: Needs Assistance Bed Mobility: Supine to Sit     Supine to sit: Min guard     General bed mobility comments: increased time needed to come to EOB, min-guard for safety as she scooted    Transfers Overall transfer level: Needs assistance Equipment used: Rolling walker (2 wheeled) Transfers: Sit to/from [employer] Sit to Stand: Min assist Stand pivot transfers: Min assist       General transfer comment: tactile cues for hand placement and pt required min A to power up as well as to steady. Pt labored with all mobility. Needed hand over hand guidance for transition from RW to [facility] before  sitting.  Ambulation/Gait Ambulation/Gait assistance: Min assist Gait Distance (Feet): 20 Feet Assistive device: Rolling walker (2 wheeled) Gait Pattern/deviations: Step-through pattern;Decreased step length - right;Decreased step length - left;Decreased weight shift to right;Trunk flexed Gait velocity: decreased Gait velocity interpretation: <1.31 ft/sec, indicative of household ambulator General Gait Details: min A for steadying, with slow, shuffling gait, decreased weight shift to R, lower step height R than L. Unsafe with turning   Stairs             Wheelchair Mobility    Modified Rankin (Stroke Patients Only) Modified Rankin (Stroke Patients Only) Pre-Morbid Rankin Score: Moderate disability Modified Rankin: Moderately severe disability     Balance Overall balance assessment: Needs assistance Sitting-balance support: Feet supported Sitting balance-Leahy Scale: Fair     Standing balance support: Single extremity supported Standing balance-Leahy Scale: Poor Standing balance comment: performed standing balance activities at sink, pt required single limb UE support                            Cognition Arousal/Alertness: Awake/alert   Overall Cognitive Status: No family/caregiver present to determine baseline cognitive functioning                                 General Comments: needed visual and tactile cues, somewhat due to language barrier. Decreased attention to R sided tasks      Exercises      General Comments General comments (skin integrity, edema,  etc.): VSS. Pt from home with family. Changing rec to CIR as she is progressing and has done well there in the past      Pertinent Vitals/Pain Pain Assessment: No/denies pain Faces Pain Scale: No hurt    Home Living                      Prior Function            PT Goals (current goals can now be found in the care plan section) Acute Rehab PT Goals Patient  Stated Goal: did not state PT Goal Formulation: Patient unable to participate in goal setting Time For Goal Achievement: 06/18/21 Potential to Achieve Goals: Fair Progress towards PT goals: Progressing toward goals    Frequency    Min 4X/week      PT Plan Discharge plan needs to be updated;Frequency needs to be updated    Co-evaluation              AM-PAC PT "6 Clicks" Mobility   Outcome Measure  Help needed turning from your back to your side while in a flat bed without using bedrails?: None Help needed moving from lying on your back to sitting on the side of a flat bed without using bedrails?: None Help needed moving to and from a bed to a chair (including a wheelchair)?: A Little Help needed standing up from a chair using your arms (e.g., wheelchair or bedside chair)?: A Lot Help needed to walk in hospital room?: A Little Help needed climbing 3-5 steps with a railing? 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

## 2021-06-05 NOTE — [place] FL2 (Signed)
[place] MEDICAID FL2 LEVEL OF CARE SCREENING TOOL     IDENTIFICATION  Patient Name: Denise Leon Birthdate: 02-14-1946 Sex: female Admission Date (Current Location): 06/03/2021  [facility] and [place] Number:  Producer, television/film/video and Address:  [facility], [address], [place], [place] [address]      Provider Number: [id number]  Attending Physician Name and Address:  Rhetta Mura, MD  Relative Name and Phone Number:       Current Level of Care: Hospital Recommended Level of Care: Skilled Nursing Facility Prior Approval Number:    Date Approved/Denied:   PASRR Number: [id number]  Discharge Plan: SNF    Current Diagnoses: Patient Active Problem List   Diagnosis Date Noted   Stroke (cerebrum) (HCC) 06/05/2021   Hypocalcemia 06/04/2021   Hypertensive urgency 06/04/2021   History of CVA (cerebrovascular accident) 06/04/2021   Leukocytosis 06/04/2021   Acute right-sided weakness 06/03/2021   Palpitations 11/16/2019   Essential hypertension 11/16/2019   Hyperlipidemia 11/16/2019   CKD (chronic kidney disease), stage IIIa 11/16/2019   Thyroid nodule, L 11/16/2019   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   Stroke (HCC) 11/14/2019   Acute ischemic stroke (HCC) patchy L frontal infarcts s/p tPA 11/14/2019    Orientation RESPIRATION BLADDER Height & Weight     Self, Time, Situation, Place  Normal Continent Weight: 137 lb 12.6 oz (62.5 kg) Height:  5\' 2"  (157.5 cm)  BEHAVIORAL SYMPTOMS/MOOD NEUROLOGICAL BOWEL NUTRITION STATUS      Continent Diet (heart healthy)  AMBULATORY STATUS COMMUNICATION OF NEEDS Skin   Limited Assist Verbally Normal                       Personal Care Assistance Level of Assistance  Bathing, Feeding, Dressing Bathing Assistance: Limited assistance Feeding assistance: Independent Dressing Assistance: Limited assistance     Functional Limitations Info             SPECIAL CARE  FACTORS FREQUENCY  PT (By licensed PT), OT (By licensed OT)     PT Frequency: 5x/wk OT Frequency: 5x/wk            Contractures Contractures Info: Not present    Additional Factors Info  Code Status, Allergies, Psychotropic Code Status Info: Full Allergies Info: NKA Psychotropic Info: Cymbalta 30mg  daily         Current Medications (06/05/2021):  This is the current hospital active medication list Current Facility-Administered Medications  Medication Dose Route Frequency Provider Last Rate Last Admin   0.9 %  sodium chloride infusion   Intravenous Continuous , MD 75 mL/hr at 06/05/21 0906 New Bag at 06/05/21 0906   acetaminophen (TYLENOL) tablet 650 mg  650 mg Oral Q4H PRN 06/07/21, MD       Or   acetaminophen (TYLENOL) 160 MG/5ML solution 650 mg  650 mg Per Tube Q4H PRN 06/07/21 A, MD       Or   acetaminophen (TYLENOL) suppository 650 mg  650 mg Rectal Q4H PRN Clydie Braun A, MD       aspirin suppository 300 mg  300 mg Rectal Daily Smith, Rondell A, MD       Or   aspirin tablet 325 mg  325 mg Oral Daily Madelyn Flavors, Rondell A, MD   325 mg at 06/05/21 0857   atorvastatin (LIPITOR) tablet 80 mg  80 mg Oral Daily Katrinka Blazing A, MD   80 mg at 06/05/21 [phone]  clopidogrel (PLAVIX) tablet 75 mg  75 mg Oral Daily Marvel Plan, MD   75 mg at 06/05/21 0858   DULoxetine (CYMBALTA) DR capsule 30 mg  30 mg Oral Daily Katrinka Blazing, Rondell A, MD   30 mg at 06/05/21 0858   enoxaparin (LOVENOX) injection 40 mg  40 mg Subcutaneous Q24H Madelyn Flavors A, MD   40 mg at 06/05/21 1211   hydrALAZINE (APRESOLINE) injection 10 mg  10 mg Intravenous Q4H PRN Smith, Rondell A, MD       latanoprost (XALATAN) 0.005 % ophthalmic solution 1 drop  1 drop Both Eyes QHS Smith, Rondell A, MD   1 drop at 06/04/21 2210   LORazepam (ATIVAN) injection 0.5 mg  0.5 mg Intravenous Once PRN Clydie Braun, MD       Melene Muller ON 06/06/2021] vitamin B-12 (CYANOCOBALAMIN) tablet 1,000 mcg  1,000  mcg Oral Daily Marvel Plan, MD         Discharge Medications: Please see discharge summary for a list of discharge medications.  Relevant Imaging Results:  Relevant Lab Results:   Additional Information SS#: [id number]; Patient is not vaccinated  Baldemar Lenis, LCSW

## 2021-06-05 NOTE — Progress Notes (Addendum)
STROKE TEAM PROGRESS NOTE   ATTENDING NOTE: I reviewed above note and agree with the assessment and plan. Pt was seen and examined.   75 year old female with history of hypertension, hyperlipidemia, hearing loss, spine surgery with residual right-sided numbness admitted for increased right-sided weakness and facial numbness.  In 10/2019 patient admitted for right-sided weakness and right facial droop.  CT no acute abnormality.  Status post tPA.  MRI showed left frontal patchy small infarcts.  CTA head and neck aortic atherosclerosis and left ICA mixed plaques but no significant stenosis.  EF 60 to 65%, DVT negative.  Loop recorder placed.  LDL 156, A1c 5.7.  She was discharged with DAPT and Lipitor 80.  On this admission CT no acute abnormality.  CTA head and neck no change compared with the one in 10/2019, still has left ICA mixed plaque.  MRI showed acute left frontal patchy infarcts again.  EF 55 to 60%, LDL 131, A1c 5.8, B12 169.  Creatinine 1.10, WBC 13.4.  Loop recorder interrogation pending, but so far no A. fib per previous interrogations.  On exam, patient awake, alert, eyes open, hard of hearing, orientated to age, place, but not to time. No aphasia, but paucity of speech given language barrier, following all simple commands. Able to name 3/5 and repeat simple sentences. No gaze palsy, tracking bilaterally, visual field full, PERRL. Right mild facial droop. Tongue midline.  LUE 4/5 and RUE proximal 3+/5 and distal 3+/5. Bilaterally LEs 4/5 proximal and distal, no drift. Sensation symmetrical bilaterally, right FTN mild ataxia somehow out of proportion to the weakness, gait not tested.   Etiology for patient stroke likely due to left ICA unstable/high risk plaque.  So far no evidence of A. fib.  Had vascular surgery Dr. Randie Heinzain consultation, plan for left ICA CEA versus TCAR on Thursday.  Continue aspirin 325 and Plavix 75 DAPT, continue Lipitor 80.  Further antithrombotic regimen per vascular  surgery.  PT/OT recommend CIR. Will follow.  For detailed assessment and plan, please refer to above as I have made changes wherever appropriate.   Denise PlanJindong Jullian Clayson, MD PhD Stroke Neurology 06/05/2021 1:25 PM    INTERVAL HISTORY She is sitting up in bed eating breakfast on my exam. No family is present.  She voices no new neurological complaints and states she slept fairly well.  Vitals:   06/04/21 1942 06/05/21 0022 06/05/21 0458 06/05/21 0742  BP: (!) 147/79 (!) 153/84 (!) 161/91 (!) 143/98  Pulse: 92 82 78 89  Resp: 19 17 19 17   Temp: 97.9 F (36.6 C) (!) 97.5 F (36.4 C) 97.9 F (36.6 C) 98.9 F (37.2 C)  TempSrc: Oral Oral Oral Oral  SpO2: 98% 98% 98% 96%  Weight:      Height:       CBC:  Recent Labs  Lab 06/03/21 2200 06/04/21 1141  WBC 11.3* 13.4*  NEUTROABS 6.7 9.5*  HGB 12.2 13.3  HCT 35.9* 38.9  MCV 90.2 89.8  PLT 287 308   Basic Metabolic Panel:  Recent Labs  Lab 06/04/21 1141 06/05/21 0211  NA 137 137  K 3.9 3.7  CL 103 105  CO2 25 23  GLUCOSE 113* 110*  BUN 20 26*  CREATININE 1.00 1.10*  CALCIUM 8.8* 8.7*   Lipid Panel:  Recent Labs  Lab 06/04/21 1141  CHOL 206*  TRIG 124  HDL 50  CHOLHDL 4.1  VLDL 25  LDLCALC 161131*   HgbA1c:  Recent Labs  Lab 06/04/21 1141  HGBA1C 5.8*  Urine Drug Screen:  Recent Labs  Lab 06/04/21 0127  LABOPIA NONE DETECTED  COCAINSCRNUR NONE DETECTED  LABBENZ NONE DETECTED  AMPHETMU NONE DETECTED  THCU NONE DETECTED  LABBARB NONE DETECTED    Alcohol Level  Recent Labs  Lab 06/03/21 2200  ETH <10    IMAGING past 24 hours MR BRAIN WO CONTRAST  Result Date: 06/05/2021 CLINICAL DATA:  Follow-up examination for acute stroke. EXAM: MRI HEAD WITHOUT CONTRAST TECHNIQUE: Multiplanar, multiecho pulse sequences of the brain and surrounding structures were obtained without intravenous contrast. COMPARISON:  Prior CT from 06/03/2021. FINDINGS: Brain: Diffuse prominence of the CSF containing spaces compatible  generalized age-related cerebral atrophy. Patchy and confluent T2/FLAIR hyperintensity within the periventricular and deep white matter both cerebral hemispheres most consistent with chronic small vessel ischemic disease, moderate to advanced in nature. Few scattered superimposed remote lacunar infarcts noted about the left hemispheric cerebral white matter. Patchy areas of restricted diffusion seen involving the cortical and subcortical aspect of the left parieto-occipital region, consistent with acute ischemic infarcts (series 3, images 42-30). Extension to involve the left periatrial white matter. Associated susceptibility artifact consistent with petechial hemorrhage without frank intraparenchymal hematoma or hemorrhagic transformation. No significant regional mass effect. No other evidence for acute or subacute ischemia. Gray-white matter differentiation otherwise maintained. No other areas of remote cortical infarction. Few additional chronic micro hemorrhages noted, nonspecific, but likely small vessel related/hypertensive in nature. No mass lesion, midline shift or mass effect. No hydrocephalus or extra-axial fluid collection. Partially empty sella noted. Suprasellar region normal. Midline structures intact. Vascular: Major intracranial vascular flow voids are maintained. Skull and upper cervical spine: Craniocervical junction within normal limits. Bone marrow signal intensity normal. No scalp soft tissue abnormality. Sinuses/Orbits: Patient status post bilateral ocular lens replacement. Paranasal sinuses are clear. Chronic appearing left mastoid effusion noted. Other: None. IMPRESSION: 1. Patchy acute ischemic infarcts involving the left parieto-occipital region as above. Associated petechial hemorrhage without frank intraparenchymal hematoma. No significant regional mass effect. 2. Underlying age-related cerebral atrophy with moderate to advanced chronic microvascular ischemic disease. Electronically  Signed   By: Rise Mu M.D.   On: 06/05/2021 00:03   DG CHEST PORT 1 VIEW  Result Date: 06/04/2021 CLINICAL DATA:  TIA EXAM: PORTABLE CHEST 1 VIEW COMPARISON:  11/03/2019 FINDINGS: Heart size within normal limits. Implanted cardiac device is noted overlying the left chest. No pulmonary vascular congestion. Lungs are clear. Advanced degenerative changes of the shoulder. IMPRESSION: No acute cardiopulmonary process. Electronically Signed   By: Acquanetta Belling M.D.   On: 06/04/2021 12:21   ECHOCARDIOGRAM COMPLETE  Result Date: 06/05/2021    ECHOCARDIOGRAM REPORT   Patient Name:   Denise Leon Date of Exam: 06/05/2021 Medical Rec #:  [id number]    Height:       62.0 in Accession #:    [id number]   Weight:       137.8 lb Date of Birth:  [birth year]   BSA:          1.632 m Patient Age:    74 years     BP:           161/91 mmHg Patient Gender: F            HR:           78 bpm. Exam Location:  Inpatient Procedure: 2D Echo, Color Doppler and Cardiac Doppler Indications:    TIA  History:        Patient has prior history of Echocardiogram examinations,  most                 recent 11/14/2019. Risk Factors:Hypertension and Dyslipidemia.  Sonographer:    Irving Burton Senior RDCS Referring Phys: [phone] RONDELL A SMITH  Sonographer Comments: Technically difficult study, patient unable to follow commands to turn or breath hold. IMPRESSIONS  1. Left ventricular ejection fraction, by estimation, is 55 to 60%. The left ventricle has normal function. The left ventricle has no regional wall motion abnormalities. There is mild left ventricular hypertrophy. Left ventricular diastolic parameters are consistent with Grade I diastolic dysfunction (impaired relaxation).  2. Right ventricular systolic function is normal. The right ventricular size is normal. There is normal pulmonary artery systolic pressure. The estimated right ventricular systolic pressure is 20.3 mmHg.  3. The mitral valve is normal in structure. No evidence of  mitral valve regurgitation. No evidence of mitral stenosis.  4. The aortic valve is tricuspid. Aortic valve regurgitation is not visualized. No aortic stenosis is present.  5. Aortic dilatation noted. 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  6. The inferior vena cava is normal in size with greater than 50% respiratory variability, suggesting right atrial pressure of 3 mmHg. FINDINGS  Left Ventricle: Left ventricular ejection fraction, by estimation, is 55 to 60%. The left ventricle has normal function. The left ventricle has no regional wall motion abnormalities. The left ventricular internal cavity size was normal in size. There is  mild left ventricular hypertrophy. Left ventricular diastolic parameters are consistent with Grade I diastolic dysfunction (impaired relaxation). Right Ventricle: The right ventricular size is normal. No increase in right ventricular wall thickness. Right ventricular systolic function is normal. There is normal pulmonary artery systolic pressure. The tricuspid regurgitant velocity is 2.08 m/s, and  with an assumed right atrial pressure of 3 mmHg, the estimated right ventricular systolic pressure is 20.3 mmHg. Left Atrium: Left atrial size was normal in size. Right Atrium: Right atrial size was normal in size. Pericardium: There is no evidence of pericardial effusion. Presence of pericardial fat pad. Mitral Valve: The mitral valve is normal in structure. No evidence of mitral valve regurgitation. No evidence of mitral valve stenosis. Tricuspid Valve: The tricuspid valve is normal in structure. Tricuspid valve regurgitation is trivial. Aortic Valve: The aortic valve is tricuspid. Aortic valve regurgitation is not visualized. No aortic stenosis is present. Pulmonic Valve: The pulmonic valve was not well visualized. Pulmonic valve regurgitation is trivial. Aorta: The aortic root is normal in size and structure and aortic dilatation noted. There is mild dilatation  of the ascending aorta, measuring 37 mm. Venous: The inferior vena cava is normal in size with greater than 50% respiratory variability, suggesting right atrial pressure of 3 mmHg. IAS/Shunts: The interatrial septum was not well visualized.  LEFT VENTRICLE PLAX 2D LVIDd:         4.40 cm  Diastology LVIDs:         2.90 cm  LV e' medial:    4.57 cm/s LV PW:         1.10 cm  LV E/e' medial:  12.1 LV IVS:        0.90 cm  LV e' lateral:   6.42 cm/s LVOT diam:     2.00 cm  LV E/e' lateral: 8.6 LV SV:         46 LV SV Index:   28 LVOT Area:     3.14 cm  RIGHT VENTRICLE RV S prime:     16.10  cm/s LEFT ATRIUM             Index       RIGHT ATRIUM           Index LA diam:        2.80 cm 1.72 cm/m  RA Area:     11.10 cm LA Vol (A2C):   34.9 ml 21.39 ml/m RA Volume:   22.90 ml  14.03 ml/m LA Vol (A4C):   50.0 ml 30.64 ml/m LA Biplane Vol: 44.7 ml 27.39 ml/m  AORTIC VALVE LVOT Vmax:   77.70 cm/s LVOT Vmean:  63.300 cm/s LVOT VTI:    0.147 m  AORTA Ao Root diam: 3.50 cm Ao Asc diam:  3.70 cm MITRAL VALVE               TRICUSPID VALVE MV Area (PHT): 3.93 cm    TR Peak grad:   17.3 mmHg MV Decel Time: 193 msec    TR Vmax:        208.00 cm/s MV E velocity: 55.30 cm/s MV A velocity: 81.80 cm/s  SHUNTS MV E/A ratio:  0.68        Systemic VTI:  0.15 m                            Systemic Diam: 2.00 cm Epifanio Lesches MD Electronically signed by Epifanio Lesches MD Signature Date/Time: 06/05/2021/10:01:05 AM    Final     PHYSICAL EXAM  Temp:  [97.5 F (36.4 C)-98.9 F (37.2 C)] 98.9 F (37.2 C) (08/22 0742) Pulse Rate:  [76-92] 89 (08/22 0742) Resp:  [14-19] 17 (08/22 0742) BP: (143-164)/(77-98) 143/98 (08/22 0742) SpO2:  [96 %-98 %] 96 % (08/22 0742)  General - Well nourished, well developed, in no apparent distress.  Ophthalmologic - fundi not visualized due to noncooperation.  Cardiovascular - Regular rhythm and rate.  Mental Status -  Level of arousal and orientation to name, and hospital. Unable to  name city or state, can state the year but not month or day. Language including expression, naming, repetition, comprehension was assessed and found intact. Attention span and concentration were normal.  Cranial Nerves II - XII - II - Visual field intact OU. Pupils equal and reactive  III, IV, VI - Extraocular movements intact. V - Facial sensation intact bilaterally. VII - Facial movement intact bilaterally. VIII - Hearing & vestibular intact bilaterally. X - Palate elevates symmetrically. XI - Chin turning & shoulder shrug intact bilaterally. XII - Tongue protrusion intact.  Motor Strength - RUE 4/5, LUE 5/5, right grip is weaker than left. Right arm with very slight drift. Bulk was normal and fasciculations were absent.    Motor Tone - Muscle tone was assessed at the neck and appendages and was normal  Sensory - Light touch, temperature/pinprick were assessed and were symmetrical.    Coordination - FTN intact.   Gait and Station - deferred.   ASSESSMENT/PLAN [name] is a 75 y.o. female with history of Left patchy frontal CVA with tPA, CKD III, HTN, HLD, sensorineural hearing loss, back pain, palpitations s/p Loop recorder, asthma, Left MCA stroke, and thyroid nodule. presenting with increased right upper extremity weakness and facial numbness.   Stroke:  Acute Left  parietal-occipital infarct secondary to left ICA stenosis Code Stroke CT head  - Chronic ischemic microangiopathy and generalized atrophy without acute intracranial abnormality. ASPECTS 10.    CTA head & neck  -  Mild atherosclerotic calcification at the carotid bifurcation. No emergent LVO   MRI   -Patchy acute ischemic infarcts involving the left parieto-occipital region as above. Associated petechial hemorrhage without frank intraparenchymal hematoma. age-related cerebral atrophy with moderate to advanced chronic microvascular ischemic disease.  - Dr Randie Heinz consulted by Dr. Roda Shutters for possible Left CEA due to  symptomatic ICA stenosis  2D Echo pending - Loop recorder to be interrogated to evaluate for A fib LDL 131 HgbA1c 5.8 VTE prophylaxis - Lovenox/SCD'd    Diet   Diet Heart Room service appropriate? Yes; Fluid consistency: Thin   aspirin 325 mg daily prior to admission, now on aspirin 325 mg daily and clopidogrel 75 mg daily.  Therapy recommendations:  SNF Disposition:  pending   Hypertension Home meds:  norvasc, coreg Stable Long-term BP goal normotensive  Hyperlipidemia Home meds:  Lipitor 80mg ,  LDL 131, goal < 70 Continue statin at discharge  Diabetes type II Controlled Home meds:  none HgbA1c 5.8, goal < 7.0 CBGs Recent Labs    06/03/21 2152  GLUCAP 163*    SSI  Other Stroke Risk Factors Advanced Age >/= 38  Hx stroke/TIA    Hospital day # 0  76, NP   To contact Stroke Continuity provider, please refer to [place]. After hours, contact General Neurology

## 2021-06-05 NOTE — TOC Initial Note (Signed)
Transition of Care [facility]) - Initial/Assessment Note    Patient Details  Name: Denise Leon MRN: [id number] Date of Birth: 10/04/1946  [facility]) CM/SW Contact:    Geralynn Ochs, LCSW Phone Number: 06/05/2021, 2:52 PM  Clinical Narrative:        CSW met with patient to discuss recommendation for SNF placement, utilizing [place] interpreter. Patient in agreement with SNF, has not been to SNF before. CSW explained referral process and will follow back up with patient on bed offers. CSW also discussed with PT, patient with improved participation and updated recommendation for CIR. CSW to continue to work on SNF as a backup to [employer]. CSW to follow.           Expected Discharge Plan: IP Rehab Facility Barriers to Discharge: Continued Medical Work up   Patient Goals and CMS Choice Patient states their goals for this hospitalization and ongoing recovery are:: to get better CMS Medicare.gov Compare Post Acute Care list provided to:: Patient Choice offered to / list presented to : Patient  Expected Discharge Plan and Services Expected Discharge Plan: [place] arrangements for the past 2 months: Single Family Home                                      Prior Living Arrangements/Services Living arrangements for the past 2 months: Single Family Home Lives with:: Adult Children Patient language and need for interpreter reviewed:: Yes Do you feel safe going back to the place where you live?: Yes      Need for Family Participation in Patient Care: No (Comment) Care giver support system in place?: Yes (comment)   Criminal Activity/Legal Involvement Pertinent to Current Situation/Hospitalization: No - Comment as needed  Activities of Daily Living      Permission Sought/Granted Permission sought to share information with : Facility Art therapist granted to share information with : Yes, Verbal Permission Granted      Permission granted to share info w AGENCY: SNF        Emotional Assessment Appearance:: Appears stated age Attitude/Demeanor/Rapport: Engaged Affect (typically observed): Appropriate Orientation: : Oriented to Self, Oriented to Place, Oriented to  Time, Oriented to Situation Alcohol / Substance Use: Not Applicable Psych Involvement: No (comment)  Admission diagnosis:  Acute right-sided weakness [R53.1] Stroke-like symptoms [R29.90] Stroke (cerebrum) ([place]) [I63.9] Patient Active Problem List   Diagnosis Date Noted   Stroke (cerebrum) ([place]) 06/05/2021   Hypocalcemia 06/04/2021   Hypertensive urgency 06/04/2021   History of CVA (cerebrovascular accident) 06/04/2021   Leukocytosis 06/04/2021   Acute right-sided weakness 06/03/2021   Palpitations 11/16/2019   Essential hypertension 11/16/2019   Hyperlipidemia 11/16/2019   CKD (chronic kidney disease), stage IIIa 11/16/2019   Thyroid nodule, L 11/16/2019   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

## 2021-06-05 NOTE — Consult Note (Addendum)
CONSULT NOTE   MRN : [id number]  Reason for Consult: symptomatic left ICA stenosis Referring Physician: primary team  History of Present Illness: 75 y/o female presented with increased right sided weakness over baseline since old stroke and facial numbness.   Code stroke was called and they  suspected that she has extended her previous stroke.  On CTA Mild atherosclerotic calcification at the carotid bifurcation without hemodynamically significant stenosis.  We have been consulted for intervention.  Past medical history includes: 2021 Left patchy frontal CVA with tPA, CKD III, HTN, HLD, sensorineural hearing loss, back pain, palpitations s/p Loop recorder, asthma, Left MCA stroke, and thyroid nodule.  [place]  and the interpreter box is in her room.  I spoke with her while she had english speaking family on the phone.       Current Facility-Administered Medications  Medication Dose Route Frequency Provider Last Rate Last Admin   0.9 %  sodium chloride infusion   Intravenous Continuous Rhetta Mura, MD 75 mL/hr at 06/05/21 0906 New Bag at 06/05/21 0906   acetaminophen (TYLENOL) tablet 650 mg  650 mg Oral Q4H PRN Clydie Braun, MD       Or   acetaminophen (TYLENOL) 160 MG/5ML solution 650 mg  650 mg Per Tube Q4H PRN Clydie Braun, MD       Or   acetaminophen (TYLENOL) suppository 650 mg  650 mg Rectal Q4H PRN Clydie Braun, MD       aspirin suppository 300 mg  300 mg Rectal Daily Katrinka Blazing, Rondell A, MD       Or   aspirin tablet 325 mg  325 mg Oral Daily Madelyn Flavors A, MD   325 mg at 06/05/21 0857   atorvastatin (LIPITOR) tablet 80 mg  80 mg Oral Daily Madelyn Flavors A, MD   80 mg at 06/05/21 4332   clopidogrel (PLAVIX) tablet 75 mg  75 mg Oral Daily Marvel Plan, MD   75 mg at 06/05/21 9518   cyanocobalamin ((VITAMIN B-12)) injection 1,000 mcg  1,000 mcg Intramuscular Once Marvel Plan, MD       DULoxetine (CYMBALTA) DR capsule 30 mg  30 mg Oral  Daily Katrinka Blazing, Rondell A, MD   30 mg at 06/05/21 0858   enoxaparin (LOVENOX) injection 40 mg  40 mg Subcutaneous Q24H Madelyn Flavors A, MD   40 mg at 06/04/21 1238   hydrALAZINE (APRESOLINE) injection 10 mg  10 mg Intravenous Q4H PRN Madelyn Flavors A, MD       latanoprost (XALATAN) 0.005 % ophthalmic solution 1 drop  1 drop Both Eyes QHS Smith, Rondell A, MD   1 drop at 06/04/21 2210   LORazepam (ATIVAN) injection 0.5 mg  0.5 mg Intravenous Once PRN Clydie Braun, MD       [START ON 06/06/2021] vitamin B-12 (CYANOCOBALAMIN) tablet 1,000 mcg  1,000 mcg Oral Daily Marvel Plan, MD        Pt meds include: Statin :Yes Betablocker: No ASA: Yes Other anticoagulants/antiplatelets: plavix  Past Medical History:  Diagnosis Date   Acute ischemic stroke (HCC) patchy L frontal infarcts s/p tPA 11/14/2019   Asthma    CKD (chronic kidney disease), stage IIIb 11/16/2019   Hyperlipidemia 11/16/2019   Hypertension    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   Palpitations 11/16/2019   Stroke (HCC) 11/14/2019   Thyroid nodule, L 11/16/2019    Past Surgical History:  Procedure Laterality Date  HIP SURGERY     NECK SURGERY      Social History Social History   Tobacco Use   Smoking status: Never   Smokeless tobacco: Never  Vaping Use   Vaping Use: Never used  Substance Use Topics   Alcohol use: Never   Drug use: Never    Family History Family History  Problem Relation Age of Onset   Lung disease Mother     No Known Allergies   REVIEW OF SYSTEMS  General: [ ]  Weight loss, [ ]  Fever, [ ]  chills Neurologic: [ ]  Dizziness, [ ]  Blackouts, [ ]  Seizure [ ]  Stroke, [ ]  "Mini stroke", [ ]  Slurred speech, [ ]  Temporary blindness; [ ]  weakness in arms or legs, [ ]  Hoarseness [ ]  Dysphagia Cardiac: [ ]  Chest pain/pressure, [ ]  Shortness of breath at rest [ ]  Shortness of breath with exertion, [ ]  Atrial fibrillation or irregular heartbeat  Vascular: [ ]  Pain in legs with walking, [ ]  Pain  in legs at rest, [ ]  Pain in legs at night,  [ ]  Non-healing ulcer, [ ]  Blood clot in vein/DVT,   Pulmonary: [ ]  Home oxygen, [ ]  Productive cough, [ ]  Coughing up blood, [ ]  Asthma,  [ ]  Wheezing [ ]  COPD Musculoskeletal:  [ ]  Arthritis, [ ]  Low back pain, [ ]  Joint pain Hematologic: [ ]  Easy Bruising, [ ]  Anemia; [ ]  Hepatitis Gastrointestinal: [ ]  Blood in stool, [ ]  Gastroesophageal Reflux/heartburn, Urinary: [ ]  chronic Kidney disease, [ ]  on HD - [ ]  MWF or [ ]  TTHS, [ ]  Burning with urination, [ ]  Difficulty urinating Skin: [ ]  Rashes, [ ]  Wounds Psychological: [ ]  Anxiety, [ ]  Depression  Physical Examination Vitals:   06/04/21 1942 06/05/21 0022 06/05/21 0458 06/05/21 0742  BP: (!) 147/79 (!) 153/84 (!) 161/91 (!) 143/98  Pulse: 92 82 78 89  Resp: 19 17 19 17   Temp: 97.9 F (36.6 C) (!) 97.5 F (36.4 C) 97.9 F (36.6 C) 98.9 F (37.2 C)  TempSrc: Oral Oral Oral Oral  SpO2: 98% 98% 98% 96%  Weight:      Height:       Body mass index is 25.2 kg/m.  General:  WDWN in NAD HENT: WNL Eyes: Pupils equal Pulmonary: normal non-labored breathing , without Rales, rhonchi,  wheezing Cardiac: RRR, without  Murmurs, rubs or gallops; No carotid bruits Abdomen: soft, NT, no masses Skin: no rashes, ulcers noted;  no Gangrene , no cellulitis; no open wounds;   Vascular Exam/Pulses:Palpable pulse   Musculoskeletal: no muscle wasting or atrophy; no edema  Neurologic: A&O X 3; Appropriate Affect ;  SENSATION: normal; MOTOR FUNCTION: right arm weakness  Speech is fluent/normal   Significant Diagnostic Studies: CBC Lab Results  Component Value Date   WBC 13.4 (H) 06/04/2021   HGB 13.3 06/04/2021   HCT 38.9 06/04/2021   MCV 89.8 06/04/2021   PLT 308 06/04/2021    BMET    Component Value Date/Time   NA 137 06/05/2021 0211   K 3.7 06/05/2021 0211   CL 105 06/05/2021 0211   CO2 23 06/05/2021 0211   GLUCOSE 110 (H) 06/05/2021 0211   BUN 26 (H) 06/05/2021 0211    CREATININE 1.10 (H) 06/05/2021 0211   CALCIUM 8.7 (L) 06/05/2021 0211   GFRNONAA 53 (L) 06/05/2021 0211   GFRAA 42 (L) 12/02/2019 0549   Estimated Creatinine Clearance: 39.6 mL/min (A) (by C-G formula based on SCr of  1.1 mg/dL (H)).  COAG Lab Results  Component Value Date   INR 0.9 06/03/2021   INR 0.9 11/13/2019     Non-Invasive Vascular Imaging:  CTA head and neck FINDINGS: CTA NECK FINDINGS   SKELETON: There is no bony spinal canal stenosis. No lytic or blastic lesion.   OTHER NECK: 2.0 cm left thyroid nodule.   UPPER CHEST: No pneumothorax or pleural effusion. No nodules or masses.   AORTIC ARCH:   There is soft and calcific atherosclerosis of the aortic arch. There is no aneurysm, dissection or hemodynamically significant stenosis of the visualized portion of the aorta. Conventional 3 vessel aortic branching pattern. The visualized proximal subclavian arteries are widely patent.   RIGHT CAROTID SYSTEM: Normal without aneurysm, dissection or stenosis.   LEFT CAROTID SYSTEM: No dissection, occlusion or aneurysm. Mild atherosclerotic calcification at the carotid bifurcation without hemodynamically significant stenosis.   VERTEBRAL ARTERIES: Left dominant configuration. Both origins are clearly patent. There is no dissection, occlusion or flow-limiting stenosis to the skull base (V1-V3 segments).   CTA HEAD FINDINGS   POSTERIOR CIRCULATION:   --Vertebral arteries: Normal V4 segments.   --Inferior cerebellar arteries: Normal.   --Basilar artery: Normal.   --Superior cerebellar arteries: Normal.   --Posterior cerebral arteries (PCA): Normal.   ANTERIOR CIRCULATION:   --Intracranial internal carotid arteries: Normal.   --Anterior cerebral arteries (ACA): Normal. Both A1 segments are present. Patent anterior communicating artery (a-comm).   --Middle cerebral arteries (MCA): Normal.   VENOUS SINUSES: As permitted by contrast timing, patent.    ANATOMIC VARIANTS: None   Review of the MIP images confirms the above findings.   IMPRESSION: 1. No emergent large vessel occlusion or high-grade stenosis of the intracranial or cervical arteries. 2. 2 cm left thyroid nodule. Recommend thyroid US (ref: J Am Coll Radiol. 2015 Feb;12(2): 143-50).   Aortic Atherosclerosis (ICD10-I70.0).    ASSESSMENT/PLAN:  Stroke with symptomatic right UE weakness left ICA stenosis She had a previous stroke in 2021 resulting in weakness of the right UE.  She had weakness above her baseline Neurology hey  suspected that she has extended her previous stroke.  Plan for Carotid endarterectomy vs TCAR pending Dr. Darcella Cheshire review.   Mosetta Pigeon 06/05/2021 9:23 AM  I have independently interviewed patient and agree with PA assessment and plan above.  I discussed with Dr. Roda Shutters with neurology appears that although there is not high-grade stenosis this is soft plaque and likely asymptomatic lesion.  We will plan for left carotid endarterectomy versus more likely transcarotid artery stenting.  I discussed with the patient via interpreter and she demonstrates good understanding. I have also discussed with her daughter via telephone.  Surgery will be planned for Thursday this week.  Continue aspirin, Plavix and statin.  Odis Turck C. Randie Heinz, MD [facility] Office: [phone] Pager: [phone]

## 2021-06-05 NOTE — Progress Notes (Signed)
Occupational Therapy Treatment Patient Details Name: Denise Leon MRN: [id number] DOB: Mar 23, 1946 Today's Date: 06/05/2021    History of present illness This 75 y.o. female admitted with increased Rt sided weakness.  Work up underway with working diagnosis of new stroke vs. recrudescence.  MRI showed acute L CVA in parieto-occipital region.   PMH includes CVA with Rt residual weakness, asthma, CKD, HTN, palpitations   OT comments  Pt received in bed, agreeable to OT session. Pt required minguard for bed mobility, minA for LB dressing and minA at RW level for completion of grooming while standing at sink level and for toilet transfer. Pt continues to demonstrate cognitive limitations, instability, decreased ROM, and decreased activity tolerance. Pt will continue to benefit from skilled OT services to maximize safety and independence with ADL/IADL and functional mobility. Will continue to follow acutely and progress as tolerated. Due to pt progressing toward established OT goals, discharge recommendation changed to CIR. Will continue to follow acutely.   Follow Up Recommendations  CIR    Equipment Recommendations  None recommended by OT    Recommendations for Other Services      Precautions / Restrictions Precautions Precautions: Fall Restrictions Weight Bearing Restrictions: No       Mobility Bed Mobility Overal bed mobility: Needs Assistance Bed Mobility: Supine to Sit     Supine to sit: Min guard Sit to supine: Min guard   General bed mobility comments: cues for use of bed rails and to progress hips to EOB    Transfers Overall transfer level: Needs assistance Equipment used: Rolling walker (2 wheeled) Transfers: Sit to/from Stand Sit to Stand: Min assist Stand pivot transfers: Min assist       General transfer comment: minA to powerup into standing, cues for safe hand placement, and assist for controlled descent    Balance Overall balance assessment: Needs  assistance Sitting-balance support: Feet supported Sitting balance-Leahy Scale: Fair     Standing balance support: Single extremity supported Standing balance-Leahy Scale: Poor Standing balance comment: pt reliant on at least single UE support in standing. during bimanual tasks, pt would rest forearm on sink                           ADL either performed or assessed with clinical judgement   ADL Overall ADL's : Needs assistance/impaired     Grooming: Wash/dry hands;Wash/dry face;Oral care;Brushing hair;Minimal assistance;Standing Grooming Details (indicate cue type and reason): completed while standing at sink level                 Toilet Transfer: Minimal Production designer, theatre/television/film Details (indicate cue type and reason): with ambulation to toilet, pt required assistance for controlled descent and assist to powerup into standing Toileting- Clothing Manipulation and Hygiene: Minimal assistance;Sit to/from stand Toileting - Clothing Manipulation Details (indicate cue type and reason): for assistance with mesh briefs     Functional mobility during ADLs: Minimal assistance;Rolling walker General ADL Comments: cues for safe use of RW, stability, cues for sequencing.     Vision       Perception     Praxis      Cognition Arousal/Alertness: Awake/alert Behavior During Therapy: Flat affect Overall Cognitive Status: No family/caregiver present to determine baseline cognitive functioning                                 General Comments: increased need for  multimodal cues, anticipate partially due to language barrier. Pt declined use of stratus interpreter. Pt with sustaned attention for about 7 minutes this session during grooming and toileting with decreased need for cues and problem solving.        Exercises     Shoulder Instructions       General Comments VSS    Pertinent Vitals/ Pain       Pain Assessment: No/denies pain Faces Pain  Scale: No hurt  Home Living                                          Prior Functioning/Environment              Frequency  Min 2X/week        Progress Toward Goals  OT Goals(current goals can now be found in the care plan section)  Progress towards OT goals: Progressing toward goals  Acute Rehab OT Goals Patient Stated Goal: did not state OT Goal Formulation: With patient Time For Goal Achievement: 06/18/21 Potential to Achieve Goals: Good ADL Goals Pt Will Perform Grooming: with supervision;standing Pt Will Perform Upper Body Bathing: with set-up;with supervision;sitting Pt Will Perform Lower Body Bathing: with supervision;sit to/from stand Pt Will Perform Upper Body Dressing: with supervision;with set-up;sitting Pt Will Perform Lower Body Dressing: with supervision;sit to/from stand Pt Will Transfer to Toilet: with supervision;ambulating;regular height toilet;bedside commode;grab bars Pt Will Perform Toileting - Clothing Manipulation and hygiene: with supervision;sit to/from stand Pt Will Perform Tub/Shower Transfer: Tub transfer;with supervision;ambulating;shower seat;rolling walker  Plan Discharge plan needs to be updated    Co-evaluation                 AM-PAC OT "6 Clicks" Daily Activity     Outcome Measure   Help from another person eating meals?: A Little Help from another person taking care of personal grooming?: A Little Help from another person toileting, which includes using toliet, bedpan, or urinal?: A Little Help from another person bathing (including washing, rinsing, drying)?: A Lot Help from another person to put on and taking off regular upper body clothing?: A Lot Help from another person to put on and taking off regular lower body clothing?: A Lot 6 Click Score: 15    End of Session Equipment Utilized During Treatment: Gait belt  OT Visit Diagnosis: Unsteadiness on feet (R26.81);Hemiplegia and  hemiparesis Hemiplegia - Right/Left: Right Hemiplegia - dominant/non-dominant: Dominant Hemiplegia - caused by: Cerebral infarction   Activity Tolerance Patient limited by fatigue   Patient Left in bed;with bed alarm set;with call bell/phone within reach   Nurse Communication Mobility status        Time: 9562-1308 OT Time Calculation (min): 19 min  Charges: OT General Charges $OT Visit: 1 Visit OT Treatments $Self Care/Home Management : 8-22 mins  Rosey Bath OTR/L Acute Rehabilitation Services Office: [phone]    Rebeca Alert 06/05/2021, 3:17 PM

## 2021-06-06 LAB — COMPREHENSIVE METABOLIC PANEL
ALT: 12 U/L (ref 0–44)
AST: 20 U/L (ref 15–41)
Albumin: 2.9 g/dL — ABNORMAL LOW (ref 3.5–5.0)
Alkaline Phosphatase: 43 U/L (ref 38–126)
Anion gap: 6 (ref 5–15)
BUN: 18 mg/dL (ref 8–23)
CO2: 26 mmol/L (ref 22–32)
Calcium: 8.3 mg/dL — ABNORMAL LOW (ref 8.9–10.3)
Chloride: 107 mmol/L (ref 98–111)
Creatinine, Ser: 1.16 mg/dL — ABNORMAL HIGH (ref 0.44–1.00)
GFR, Estimated: 50 mL/min — ABNORMAL LOW (ref >60)
Glucose, Bld: 109 mg/dL — ABNORMAL HIGH (ref 70–99)
Potassium: 3.9 mmol/L (ref 3.5–5.1)
Sodium: 139 mmol/L (ref 135–145)
Total Bilirubin: 1 mg/dL (ref 0.3–1.2)
Total Protein: 5.6 g/dL — ABNORMAL LOW (ref 6.5–8.1)

## 2021-06-06 NOTE — PMR Pre-admission (Signed)
PMR Admission Coordinator Pre-Admission Assessment  Patient: Denise Leon is an 75 y.o., female MRN: [id number] DOB: 12-20-1945 Height: 5' 4"  (162.6 cm) Weight: 61.5 kg  Insurance Information HMO:     PPO:      PCP:      IPA:      80/20:      OTHER:  PRIMARY: Medicare a and b      Policy#: [id number]      Subscriber: pt Benefits:  Phone #: passport one source online     Name: 8/23 Eff. Date: A 11/16/2019 and B 08/16/2019     Deduct: $1556      Out of Pocket Max: none      Life Max: none CIR: 100%      SNF: 20 full days Outpatient: 80%     Co-Pay: 20% Home Health: 100%      Co-Pay: none DME: 80%     Co-Pay: 20% Providers: pt choice  SECONDARY: Medicaid of [place]      Policy#: [id number] m    8/23 active MAAQN per passport one source online  Financial Counselor:       Phone#:   The "Data Collection Information Summary" for patients in Inpatient Rehabilitation Facilities with attached "Privacy Act [place] Records" was provided and verbally reviewed with: Family  Emergency Contact Information Contact Information     Name Relation Home Work [place] Daughter   [place], [place] Other   [phone]       Current Medical History  Patient Admitting Diagnosis: CVA  History of Present Illness:  75 year old non-English-speaking right-handed female.  Her first language is TAGALOG.History of hearing loss, asthma, CKD stage III, hypertension as well as patchy infarct posterior left frontal region status post TPA/loop recorder with right side residual weakness maintained on full-strength aspirin and received CIR 11/16/2019 to 12/04/2019 and discharged to home ambulating with a rolling walker close supervision.  Presented 06/04/2021 with right side numbness and increasing weakness.  CT of the head showed chronic ischemic microangiopathy and generalized atrophy without acute abnormality.  CT angiogram head and neck showed aortic atherosclerosis and left ICA  mixed plaque but no significant stenosis.  There was a 2 cm left thyroid nodule recommend thyroid ultrasound as outpatient.  MRI identified patchy acute ischemic infarct involving the left parieto-occipital region associated petechial hemorrhage without frank intraparenchymal hematoma.  No significant mass-effect.  Vascular surgery consulted in regards to carotid atherosclerosis due to the fact patient was symptomatic underwent trans carotid placement and stenting 06/08/2021 per Dr. Donzetta Matters.  Patient is currently maintained on aspirin 325 mg daily as well as Plavix 75 mg daily for CVA prophylaxis.  Tolerating a regular consistency diet.  Therapy evaluations completed due to patient decreased functional mobility right side weakness was admitted   Complete NIHSS TOTAL: 4  Patient's medical record from [facility] has been reviewed by the rehabilitation admission coordinator and physician.  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 No  Family History   family history includes Lung disease in  her mother.  Current Medications  Current Facility-Administered Medications:    0.9 %  sodium chloride infusion, , Intravenous, Continuous, Baglia, Corrina, PA-C, Last Rate: 75 mL/hr at 06/10/21 0040, Infusion Verify at 06/10/21 0040   acetaminophen (TYLENOL) tablet 650 mg, 650 mg, Oral, Q4H PRN, 650 mg at 06/09/21 1403 **OR** acetaminophen (TYLENOL) 160 MG/5ML solution 650 mg, 650 mg, Per Tube, Q4H PRN **OR** acetaminophen (TYLENOL) suppository 650 mg, 650 mg, Rectal, Q4H PRN, Baglia, Corrina, PA-C    acetaminophen (TYLENOL) tablet 650 mg, 650 mg, Oral, TID AC & HS, Lavina Hamman, MD, 650 mg at 06/10/21 0858   alum & mag hydroxide-simeth (MAALOX/MYLANTA) 200-200-20 MG/5ML suspension 15-30 mL, 15-30 mL, Oral, Q2H PRN, Baglia, Corrina, PA-C   aspirin suppository 300 mg, 300 mg, Rectal, Daily **OR** aspirin tablet 325 mg, 325 mg, Oral, Daily, Baglia, Corrina, PA-C, 325 mg at 06/10/21 0858   atorvastatin (LIPITOR) tablet 80 mg, 80 mg, Oral, Daily, Baglia, Corrina, PA-C, 80 mg at 06/10/21 0859   clopidogrel (PLAVIX) tablet 75 mg, 75 mg, Oral, Daily, Baglia, Corrina, PA-C, 75 mg at 06/10/21 0858   docusate sodium (COLACE) capsule 100 mg, 100 mg, Oral, BID, Lavina Hamman, MD, 100 mg at 06/10/21 0859   DULoxetine (CYMBALTA) DR capsule 30 mg, 30 mg, Oral, Daily, Baglia, Corrina, PA-C, 30 mg at 06/10/21 0859   guaiFENesin-dextromethorphan (ROBITUSSIN DM) 100-10 MG/5ML syrup 15 mL, 15 mL, Oral, Q4H PRN, Baglia, Corrina, PA-C   hydrALAZINE (APRESOLINE) injection 10 mg, 10 mg, Intravenous, Q4H PRN, Baglia, Corrina, PA-C   HYDROmorphone (DILAUDID) injection 0.5-1 mg, 0.5-1 mg, Intravenous, Q2H PRN, Baglia, Corrina, PA-C, 1 mg at 06/08/21 1959   latanoprost (XALATAN) 0.005 % ophthalmic solution 1 drop, 1 drop, Both Eyes, QHS, Baglia, Corrina, PA-C, 1 drop at 06/09/21 2031   ondansetron (ZOFRAN) injection 4 mg, 4 mg, Intravenous, Q6H PRN, Baglia, Corrina, PA-C, 4 mg at 06/08/21 2324   oxyCODONE (Oxy IR/ROXICODONE) immediate release tablet 5 mg, 5 mg, Oral, Q4H PRN, Lavina Hamman, MD   pantoprazole (PROTONIX) EC tablet 40 mg, 40 mg, Oral, Daily, Baglia, Corrina, PA-C, 40 mg at 06/10/21 0858   phenol (CHLORASEPTIC) mouth spray 1 spray, 1 spray, Mouth/Throat, PRN, Baglia, Corrina, PA-C   senna-docusate (Senokot-S) tablet 1 tablet, 1 tablet, Oral, QHS PRN, Baglia, Corrina, PA-C   vitamin B-12 (CYANOCOBALAMIN) tablet 1,000 mcg, 1,000 mcg, Oral, Daily, Baglia, Corrina, PA-C, 1,000 mcg at 06/10/21 0900  Patients  Current Diet:  Diet Order             Diet Heart Room service appropriate? Yes; Fluid consistency: Thin  Diet effective now                  Precautions / Restrictions Precautions Precautions: Fall Precaution Comments: HoH, speaks Tagalog and some English Restrictions Weight Bearing Restrictions: No   Has the patient had 2 or more falls or a fall with injury in the past year? No  Prior Activity Level Limited Community (1-2x/wk): Mod I with RW and adls  Prior Functional Level Self Care: Did the patient need help bathing, dressing, using the toilet or eating? Needed some help  Indoor Mobility: Did the patient need assistance with walking from room to room (with or without device)? Independent  Stairs: Did the patient need assistance with internal or external stairs (with or without device)? Independent  Functional Cognition: Did the patient need help planning regular tasks such as shopping or remembering to take medications? Needed some help  Patient Information  Are you of Hispanic, Latino/a,or Spanish origin?: A. No, not of Hispanic, Latino/a, or Spanish origin  Patient's Response To:  Health Literacy and Transportation Is the patient able to respond to health literacy and transportation needs?: Yes  Home Assistive Devices / Scraper Devices/Equipment: None  Prior Device Use: Indicate devices/aids used by the patient prior to current illness, exacerbation or injury? Walker  Current Functional Level Cognition  Arousal/Alertness: Awake/alert Overall Cognitive Status: No family/caregiver present to determine baseline cognitive functioning Orientation Level: Oriented to person, Oriented to situation, Disoriented to place, Disoriented to time General Comments: requires multimodal cueing. Patient having difficulty hearing Stratus Interpreter. Able to understand and respond appropriately to basic commands in [place]. Pt with noted short term memory deficit  and asking "what happened to me?" about every 2-5 minutes (RN notified I'm not sure what her memory baseline was prior to [place]). Pt also c/o facial numbness on both sides and still has some R facial droop. Attention: Sustained Sustained Attention: Appears intact Awareness: Appears intact Problem Solving: Appears intact Safety/Judgment: Appears intact    Extremity Assessment (includes Sensation/Coordination)  Upper Extremity Assessment: RUE deficits/detail RUE Deficits / Details: Pt demonstrates ~55* shoulder flexion, mass grasp/release of right hand. pt reports she "has been like this" RUE Coordination: decreased fine motor, decreased gross motor LUE Deficits / Details: Per chart review, pt with h/o frozen shoulder.  Pt demonstrates ~60* Shoulder flexion.  Elbow distally grossly WFL  Lower Extremity Assessment: Defer to PT evaluation RLE Deficits / Details: AAROM hip/ knee and ankle, hip grossly 2/5, knee 3/5 and ankle3+/5 LLE Deficits / Details: AROM WFL, strength grossly 4/5    ADLs  Overall ADL's : Needs assistance/impaired Eating/Feeding: Minimal assistance, Bed level Grooming: Wash/dry hands, Wash/dry face, Oral care, Brushing hair, Minimal assistance, Standing Grooming Details (indicate cue type and reason): completed while standing at sink level Upper Body Bathing: Moderate assistance, Sitting Lower Body Bathing: Moderate assistance, Sit to/from stand Upper Body Dressing : Moderate assistance, Sitting Lower Body Dressing: Moderate assistance, Sit to/from stand Lower Body Dressing Details (indicate cue type and reason): requires mod A to don socks EOB Toilet Transfer: Minimal assistance, RW Toilet Transfer Details (indicate cue type and reason): pt voided, provided BSC over toilet, pt with increased steadiness and control Toileting- Clothing Manipulation and Hygiene: Minimal assistance, Sit to/from stand Toileting - Clothing Manipulation Details (indicate cue type and reason): for  assistance with mesh briefs Functional mobility during ADLs: Minimal assistance, Rolling walker General ADL Comments: cues for safe use of RW, stability, cues for sequencing.    Mobility  Overal bed mobility: Needs Assistance Bed Mobility: Supine to Sit, Sit to Supine Supine to sit: Min assist Sit to supine: Supervision General bed mobility comments: cues for self-assist, use of bed rails, HOB elevated    Transfers  Overall transfer level: Needs assistance Equipment used: Rolling walker (2 wheeled) Transfers: Sit to/from Stand Sit to Stand: Min assist Stand pivot transfers: Min assist General transfer comment: minA to rise and steady. Unsteady upon standing    Ambulation / Gait / Stairs / Wheelchair Mobility  Ambulation/Gait Ambulation/Gait assistance: Herbalist (Feet): 18 Feet (x2 with seated break) Assistive device: Rolling walker (2 wheeled) Gait Pattern/deviations: Step-through pattern, Decreased stride length, Decreased weight shift to right, Decreased stance time - right, Trunk flexed General Gait Details: decreased stance time on R and short step length bilaterally. MinA for balance and RW management and needs manual assist at times to advance RLE when stepping.  Up to [place] when turning and just prior to sitting due to posterior lean Gait velocity: decreased Gait velocity interpretation: <1.31 ft/sec, indicative of household ambulator    Posture / Balance Balance Overall balance assessment: Needs assistance Sitting-balance support: Feet supported Sitting balance-Leahy Scale: Good Standing balance support: Bilateral upper extremity supported Standing balance-Leahy Scale: Poor Standing balance comment: Requiring UE and external support    Special needs/care consideration Tagalog interpreter needed HOH   Previous Home Environment  Living Arrangements: Children  Lives With: Daughter Available Help at Discharge: Family, Available 24 hours/day (daughter works  days and son in law works nights so has 24/7 supervision) Type of Home: [employer] Layout: Two level, Able to live on main level with bedroom/bathroom Home Access: Level entry Bathroom Shower/Tub: Multimedia programmer: Standard Home Care Services: No Additional Comments: info gleaned from previous admission.  Per chart review and pt input.  She lives with her daughter and reports her daughter works as a PT at [employer] for Discharge Living Setting: Lives with (comment) (daughter and son in law) Type of Home at Discharge: House Discharge Home Layout: Two level, Able to live on main level with bedroom/bathroom Discharge Home Access: Level entry Discharge Bathroom Shower/Tub: Walk-in shower Discharge Bathroom Toilet: Standard Discharge Bathroom Accessibility: Yes How Accessible: Accessible via walker Does the patient have any problems obtaining your medications?: No  Social/Family/Support Systems Contact Information: daughter, [name], speaks English Anticipated Caregiver: daughter and son in law Anticipated Caregiver's Contact Information: see above Ability/Limitations of Caregiver: daughter works days Caregiver Availability: 24/7 Discharge Plan Discussed with Primary Caregiver: Yes Is Caregiver In Agreement with Plan?: Yes Does Caregiver/Family have Issues with Lodging/Transportation while Pt is in Rehab?: No  Goals Patient/Family Goal for Rehab: supervision PT, supervision to min OT, supervision SLP Expected length of stay: ELOS 10 to 12 days Cultural Considerations: Tagalog interpreter; does speak ENglish but very [place] Pt/Family Agrees to Admission and willing to participate: Yes Program Orientation Provided & Reviewed with Pt/Caregiver Including Roles  & Responsibilities: Yes  Decrease burden of Care through IP rehab admission: n/a  Possible need for SNF placement upon discharge: not anticipated  Patient Condition: I have  reviewed medical records from [facility] , spoken with  patient and daughter. I met with patient at the bedside for inpatient rehabilitation assessment.  Patient will benefit from ongoing PT, OT, and SLP, can actively participate in 3 hours of therapy a day 5 days of the week, and can make measurable gains during the admission.  Patient will also benefit from the coordinated team approach during an Inpatient Acute Rehabilitation admission.  The patient will receive intensive therapy as well as Rehabilitation physician, nursing, social worker, and care management interventions.  Due to bladder management, bowel management, safety, skin/wound care, disease management, medication administration, pain management, and patient education the patient requires 24 hour a day rehabilitation nursing.  The patient is currently min assist overall with mobility and basic ADLs.  Discharge setting and therapy post discharge at home with home health is anticipated.  Patient has agreed to participate in the Acute Inpatient Rehabilitation Program and will admit today.  Preadmission Screen Completed By: Danne Baxter RN MSN, 06/10/2021 10:21 AM ______________________________________________________________________   Discussed status with Dr. Dagoberto Ligas on 06/10/21  at 10:21 AM and received approval for admission today.  Admission Coordinator: Danne Baxter RN MSN, time 10:21 AM/Date 06/10/21    Assessment/Plan: Diagnosis: Does the need for close, 24 hr/day Medical supervision  in concert with the patient's rehab needs make it unreasonable for this patient to be served in a less intensive setting? Yes Co-Morbidities requiring supervision/potential complications: demenita?; R hemi dfue to L pareital-occipital stroke s/p Carotid stending on L; HOH; CKD3, and HTN; previous stroke Due to bladder management, bowel management, safety, skin/wound care, disease management, medication administration, pain management, and  patient education, does the patient require 24 hr/day rehab nursing? Yes Does the patient require coordinated care of a physician, rehab nurse, PT, OT, and SLP to address physical and functional deficits in the context of the above medical diagnosis(es)? Yes Addressing deficits in the following areas: balance, endurance, locomotion, strength, transferring, bowel/bladder control, bathing, dressing, feeding, grooming, toileting, cognition, speech, and language Can the patient actively participate in an intensive therapy program of at least 3 hrs of therapy 5 days a week? Yes The potential for patient to make measurable gains while on inpatient rehab is good and fair Anticipated functional outcomes upon discharge from inpatient rehab: supervision and min assist PT, supervision and min assist OT, supervision and min assist SLP Estimated rehab length of stay to reach the above functional goals is: 10-14 days Anticipated discharge destination: Home 10. Overall Rehab/Functional Prognosis: good and fair   MD Signature:

## 2021-06-06 NOTE — Progress Notes (Addendum)
PROGRESS NOTE   Denise Leon  RCV:[id number] DOB: May 26, 1946 DOA: 06/03/2021 PCP: Patient, No Pcp Per (Inactive)  Brief Narrative:  75 year old community dwelling female-PCP [facility] in [place] Known chronic lumbago HTN, thyroid nodule, CKD 3 RL cryptogenic stroke aspirin and Plavix status post tPA 11/04/2019-event recorder = high-grade baseline ectopy without A. fib-status post [employer] loop recorder 01/18/2020--after that inpatient admission patient went to inpatient rehab until 12/04/2019 and was discharged home  Developed sudden R sided weakness 05/2019 lasting 15 to 20 minutes-came to [facility] ED transferred to [facility] found to be 1 TSH B12 ordered by neurologist MRI brain showed Pathcy acute isch infracts L Parieto-occip--no hemorrhage A1C 5.8, LDL 131 Wbc 13, CXR no PNA  Hospital-Problem based course  Left ischemic acute infarcts Usual stroke pathway, follow echo--rest as per neuro On tele no new issues--PPM interrogation not done as yet--d/w nurse secretary to call [employer] for appropriate documentation in shadow chart reflecting this Endarterectomy scheduled  8/25 Dr. Randie Heinz Subacute Leukocytosis without fever--low grade-persistent since February? Etiology unclear-monitor for now--if spikes fever, need septic work-up If no source/persistent tomorrow, may need heme input--r/o Myelodysplasia etc HTN continue saline 75 cc/H, hydralazine 10 mg every 4 as needed systolic >220--Coreg 6.25 twice daily held, amlodipine 5 daily held Resume these meds post op Baseline CKD 3 Monitor labs a.m.--creatinine improving since admit on NS Underlying mild dementia Patient does not recall ever being told she has heart arrhythmia that she has had a prior stroke   DVT prophylaxis: Plavix Lovenox Code Status: Presumed full Family Communication: None present Disposition:  Status is: Observation  The patient will require care spanning > 2 midnights and should be moved to inpatient  because: Hemodynamically unstable, Persistent severe electrolyte disturbances, and Ongoing diagnostic testing needed not appropriate for outpatient work up  Dispo: The patient is from: Home              Anticipated d/c is to:  Unclear at this time              Patient currently is not medically stable to d/c.   Difficult to place patient No    Consultants:  Neurologist Vascular surgery  Procedures: Multiple  Antimicrobials: None   Subjective:  No c/o Some weak on R side still Otherwise understands need for thursdays carotid procedure Eating drinking   Objective: Vitals:   06/05/21 2337 06/06/21 0852 06/06/21 1123 06/06/21 1511  BP: (!) 176/89 (!) 157/77 (!) 181/93 (!) 146/89  Pulse: 72 78 70   Resp: 17 18 18 20   Temp: 98.7 F (37.1 C) 98.2 F (36.8 C) 98.8 F (37.1 C) 99.6 F (37.6 C)  TempSrc: Oral Oral Oral Oral  SpO2: 99% 98% 100% 98%  Weight:      Height:        Intake/Output Summary (Last 24 hours) at 06/06/2021 1654 Last data filed at 06/06/2021 0941 Gross per 24 hour  Intake 100 ml  Output --  Net 100 ml    Filed Weights   06/03/21 2222 06/04/21 1100  Weight: 62.5 kg 62.5 kg    Examination:  Awake coherent  finger-nose-finger intact on left side on right side also intact but more hesitant Remains weaker has only 4/5 power to extension flexion of the R shoulder biceps and triceps Reflexes deferred ROM intact grossly in other extremities sensory intact    Data Reviewed: personally reviewed   CBC    Component Value Date/Time   WBC 13.4 (H) 06/04/2021 1141  RBC 4.33 06/04/2021 1141   HGB 13.3 06/04/2021 1141   HCT 38.9 06/04/2021 1141   PLT 308 06/04/2021 1141   MCV 89.8 06/04/2021 1141   MCH 30.7 06/04/2021 1141   MCHC 34.2 06/04/2021 1141   RDW 12.6 06/04/2021 1141   LYMPHSABS 2.6 06/04/2021 1141   MONOABS 1.1 (H) 06/04/2021 1141   EOSABS 0.1 06/04/2021 1141   BASOSABS 0.0 06/04/2021 1141   CMP Latest Ref Rng & Units 06/06/2021  06/05/2021 06/04/2021  Glucose 70 - 99 mg/dL 341(P) 379(K) 240(X)  BUN 8 - 23 mg/dL 18 73(Z) 20  Creatinine 0.44 - 1.00 mg/dL 3.29(J) 2.42(A) 8.34  Sodium 135 - 145 mmol/L 139 137 137  Potassium 3.5 - 5.1 mmol/L 3.9 3.7 3.9  Chloride 98 - 111 mmol/L 107 105 103  CO2 22 - 32 mmol/L 26 23 25   Calcium 8.9 - 10.3 mg/dL 8.3(L) 8.7(L) 8.8(L)  Total Protein 6.5 - 8.1 g/dL ) - -  Total Bilirubin 0.3 - 1.2 mg/dL 1.0 - -  Alkaline Phos 38 - 126 U/L 43 - -  AST 15 - 41 U/L 20 - -  ALT 0 - 44 U/L 12 - -     Radiology Studies: MR BRAIN WO CONTRAST  Result Date: 06/05/2021 CLINICAL DATA:  Follow-up examination for acute stroke. EXAM: MRI HEAD WITHOUT CONTRAST TECHNIQUE: Multiplanar, multiecho pulse sequences of the brain and surrounding structures were obtained without intravenous contrast. COMPARISON:  Prior CT from 06/03/2021. FINDINGS: Brain: Diffuse prominence of the CSF containing spaces compatible generalized age-related cerebral atrophy. Patchy and confluent T2/FLAIR hyperintensity within the periventricular and deep white matter both cerebral hemispheres most consistent with chronic small vessel ischemic disease, moderate to advanced in nature. Few scattered superimposed remote lacunar infarcts noted about the left hemispheric cerebral white matter. Patchy areas of restricted diffusion seen involving the cortical and subcortical aspect of the left parieto-occipital region, consistent with acute ischemic infarcts (series 3, images 42-30). Extension to involve the left periatrial white matter. Associated susceptibility artifact consistent with petechial hemorrhage without frank intraparenchymal hematoma or hemorrhagic transformation. No significant regional mass effect. No other evidence for acute or subacute ischemia. Gray-white matter differentiation otherwise maintained. No other areas of remote cortical infarction. Few additional chronic micro hemorrhages noted, nonspecific, but likely small  vessel related/hypertensive in nature. No mass lesion, midline shift or mass effect. No hydrocephalus or extra-axial fluid collection. Partially empty sella noted. Suprasellar region normal. Midline structures intact. Vascular: Major intracranial vascular flow voids are maintained. Skull and upper cervical spine: Craniocervical junction within normal limits. Bone marrow signal intensity normal. No scalp soft tissue abnormality. Sinuses/Orbits: Patient status post bilateral ocular lens replacement. Paranasal sinuses are clear. Chronic appearing left mastoid effusion noted. Other: None. IMPRESSION: 1. Patchy acute ischemic infarcts involving the left parieto-occipital region as above. Associated petechial hemorrhage without frank intraparenchymal hematoma. No significant regional mass effect. 2. Underlying age-related cerebral atrophy with moderate to advanced chronic microvascular ischemic disease. Electronically Signed   By: 06/05/2021 M.D.   On: 06/05/2021 00:03   ECHOCARDIOGRAM COMPLETE  Result Date: 06/05/2021    ECHOCARDIOGRAM REPORT   Patient Name:   DELPHA Maxon Date of Exam: 06/05/2021 Medical Rec #:  06/07/2021    Height:       62.0 in Accession #:    [id number]   Weight:       137.8 lb Date of Birth:  02-Mar-1946   BSA:          1.632 m Patient Age:  74 years     BP:           161/91 mmHg Patient Gender: F            HR:           78 bpm. Exam Location:  Inpatient Procedure: 2D Echo, Color Doppler and Cardiac Doppler Indications:    TIA  History:        Patient has prior history of Echocardiogram examinations, most                 recent 11/14/2019. Risk Factors:Hypertension and Dyslipidemia.  Sonographer:    Irving BurtonEmily Senior RDCS Referring Phys: [phone] RONDELL A SMITH  Sonographer Comments: Technically difficult study, patient unable to follow commands to turn or breath hold. IMPRESSIONS  1. Left ventricular ejection fraction, by estimation, is 55 to 60%. The left ventricle has normal function.  The left ventricle has no regional wall motion abnormalities. There is mild left ventricular hypertrophy. Left ventricular diastolic parameters are consistent with Grade I diastolic dysfunction (impaired relaxation).  2. Right ventricular systolic function is normal. The right ventricular size is normal. There is normal pulmonary artery systolic pressure. The estimated right ventricular systolic pressure is 20.3 mmHg.  3. The mitral valve is normal in structure. No evidence of mitral valve regurgitation. No evidence of mitral stenosis.  4. The aortic valve is tricuspid. Aortic valve regurgitation is not visualized. No aortic stenosis is present.  5. Aortic dilatation noted. 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  6. The inferior vena cava is normal in size with greater than 50% respiratory variability, suggesting right atrial pressure of 3 mmHg. FINDINGS  Left Ventricle: Left ventricular ejection fraction, by estimation, is 55 to 60%. The left ventricle has normal function. The left ventricle has no regional wall motion abnormalities. The left ventricular internal cavity size was normal in size. There is  mild left ventricular hypertrophy. Left ventricular diastolic parameters are consistent with Grade I diastolic dysfunction (impaired relaxation). Right Ventricle: The right ventricular size is normal. No increase in right ventricular wall thickness. Right ventricular systolic function is normal. There is normal pulmonary artery systolic pressure. The tricuspid regurgitant velocity is 2.08 m/s, and  with an assumed right atrial pressure of 3 mmHg, the estimated right ventricular systolic pressure is 20.3 mmHg. Left Atrium: Left atrial size was normal in size. Right Atrium: Right atrial size was normal in size. Pericardium: There is no evidence of pericardial effusion. Presence of pericardial fat pad. Mitral Valve: The mitral valve is normal in structure. No evidence of mitral valve  regurgitation. No evidence of mitral valve stenosis. Tricuspid Valve: The tricuspid valve is normal in structure. Tricuspid valve regurgitation is trivial. Aortic Valve: The aortic valve is tricuspid. Aortic valve regurgitation is not visualized. No aortic stenosis is present. Pulmonic Valve: The pulmonic valve was not well visualized. Pulmonic valve regurgitation is trivial. Aorta: The aortic root is normal in size and structure and aortic dilatation noted. 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 Venous: The inferior vena cava is normal in size with greater than 50% respiratory variability, suggesting right atrial pressure of 3 mmHg. IAS/Shunts: The interatrial septum was not well visualized.  LEFT VENTRICLE PLAX 2D LVIDd:         4.40 cm  Diastology LVIDs:         2.90 cm  LV e' medial:    4.57 cm/s LV PW:  1.10 cm  LV E/e' medial:  12.1 LV IVS:        0.90 cm  LV e' lateral:   6.42 cm/s LVOT diam:     2.00 cm  LV E/e' lateral: 8.6 LV SV:         46 LV SV Index:   28 LVOT Area:     3.14 cm  RIGHT VENTRICLE RV S prime:     16.10 cm/s LEFT ATRIUM             Index       RIGHT ATRIUM           Index LA diam:        2.80 cm 1.72 cm/m  RA Area:     11.10 cm LA Vol (A2C):   34.9 ml 21.39 ml/m RA Volume:   22.90 ml  14.03 ml/m LA Vol (A4C):   50.0 ml 30.64 ml/m LA Biplane Vol: 44.7 ml 27.39 ml/m  AORTIC VALVE LVOT Vmax:   77.70 cm/s LVOT Vmean:  63.300 cm/s LVOT VTI:    0.147 m  AORTA Ao Root diam: 3.50 cm Ao Asc diam:  3.70 cm MITRAL VALVE               TRICUSPID VALVE MV Area (PHT): 3.93 cm    TR Peak grad:   17.3 mmHg MV Decel Time: 193 msec    TR Vmax:        208.00 cm/s MV E velocity: 55.30 cm/s MV A velocity: 81.80 cm/s  SHUNTS MV E/A ratio:  0.68        Systemic VTI:  0.15 m                            Systemic Diam: 2.00 cm Epifanio Lesches MD Electronically signed by Epifanio Lesches MD Signature Date/Time: 06/05/2021/10:01:05 AM    Final      Scheduled Meds:   aspirin  300 mg Rectal Daily   Or   aspirin  325 mg Oral Daily   atorvastatin  80 mg Oral Daily   clopidogrel  75 mg Oral Daily   DULoxetine  30 mg Oral Daily   enoxaparin (LOVENOX) injection  40 mg Subcutaneous Q24H   latanoprost  1 drop Both Eyes QHS   vitamin B-12  1,000 mcg Oral Daily   Continuous Infusions:  sodium chloride 75 mL/hr at 06/06/21 1522     LOS: 1 day   Time spent: 25  Rhetta Mura, MD [facility] To contact the attending provider between 7A-7P or the covering provider during after hours 7P-7A, please log into the web site [email or web address] and access using universal [place] password for that web site. If you do not have the password, please call the hospital operator.  06/06/2021, 4:54 PM

## 2021-06-06 NOTE — Plan of Care (Signed)

## 2021-06-06 NOTE — Progress Notes (Signed)
STROKE TEAM PROGRESS NOTE   INTERVAL HISTORY No family at bedside.  Patient lying in bed comfortably, no acute event overnight.  Vascular surgery on board, plan for TCAR on Thursday.  Vitals:   06/05/21 2337 06/06/21 0852 06/06/21 1123 06/06/21 1511  BP: (!) 176/89 (!) 157/77 (!) 181/93 (!) 146/89  Pulse: 72 78 70   Resp: 17 18 18 20   Temp: 98.7 F (37.1 C) 98.2 F (36.8 C) 98.8 F (37.1 C) 99.6 F (37.6 C)  TempSrc: Oral Oral Oral Oral  SpO2: 99% 98% 100% 98%  Weight:      Height:       CBC:  Recent Labs  Lab 06/03/21 2200 06/04/21 1141  WBC 11.3* 13.4*  NEUTROABS 6.7 9.5*  HGB 12.2 13.3  HCT 35.9* 38.9  MCV 90.2 89.8  PLT 287 308   Basic Metabolic Panel:  Recent Labs  Lab 06/05/21 0211 06/06/21 0341  NA 137 139  K 3.7 3.9  CL 105 107  CO2 23 26  GLUCOSE 110* 109*  BUN 26* 18  CREATININE 1.10* 1.16*  CALCIUM 8.7* 8.3*   Lipid Panel:  Recent Labs  Lab 06/04/21 1141  CHOL 206*  TRIG 124  HDL 50  CHOLHDL 4.1  VLDL 25  LDLCALC 06/06/21*   HgbA1c:  Recent Labs  Lab 06/04/21 1141  HGBA1C 5.8*   Urine Drug Screen:  Recent Labs  Lab 06/04/21 0127  LABOPIA NONE DETECTED  COCAINSCRNUR NONE DETECTED  LABBENZ NONE DETECTED  AMPHETMU NONE DETECTED  THCU NONE DETECTED  LABBARB NONE DETECTED    Alcohol Level  Recent Labs  Lab 06/03/21 2200  ETH <10    IMAGING past 24 hours No results found.  PHYSICAL EXAM  Temp:  [98.2 F (36.8 C)-99.6 F (37.6 C)] 99.6 F (37.6 C) (08/23 1511) Pulse Rate:  [70-88] 70 (08/23 1123) Resp:  [17-20] 20 (08/23 1511) BP: (136-181)/(77-94) 146/89 (08/23 1511) SpO2:  [98 %-100 %] 98 % (08/23 1511)  General - Well nourished, well developed, in no apparent distress.  Ophthalmologic - fundi not visualized due to noncooperation.  Cardiovascular - Regular rhythm and rate.  Neuro -  awake, alert, eyes open, hard of hearing, orientated to age, place, but not to time. No aphasia, but paucity of speech given language  barrier, following all simple commands. Able to name 3/5 and repeat simple sentences. No gaze palsy, tracking bilaterally, visual field full, PERRL. Right mild facial droop. Tongue midline.  LUE 4/5 and RUE proximal 3+/5 and distal 3+/5. Bilaterally LEs 4/5 proximal and distal, no drift. Sensation symmetrical bilaterally, right FTN mild ataxia somehow out of proportion to the weakness, gait not tested.   ASSESSMENT/PLAN [name] is a 75 y.o. female with history of Left patchy frontal CVA with tPA, CKD III, HTN, HLD, sensorineural hearing loss, back pain, palpitations s/p Loop recorder, asthma, Left MCA stroke, and thyroid nodule presenting with increased right upper extremity weakness and facial numbness.   Stroke:  Acute Left  parietal-occipital infarct secondary to left ICA high risk plaque CT head Chronic ischemic microangiopathy and generalized atrophy without acute intracranial abnormality. ASPECTS 10.    CTA head & neck aortic atherosclerosis and left ICA mixed plaques but no significant stenosis MRI Patchy acute ischemic infarcts involving the left parieto-occipital region as above. Associated petechial hemorrhage without frank intraparenchymal hematoma. age-related cerebral atrophy with moderate to advanced chronic microvascular ischemic disease. 2D Echo - EF 55 - 60%. No cardiac source of emboli identified.  Loop  recorder to be interrogated to evaluate for A fib LDL 131 HgbA1c 5.8 VTE prophylaxis - Lovenox/SCD'd aspirin 325 mg daily prior to admission, now on aspirin 325 mg daily and clopidogrel 75 mg daily.  Duration per vascular surgery Therapy recommendations:  CIR  Disposition:  pending   History of stroke In 10/2019 patient admitted for right-sided weakness and right facial droop.  CT no acute abnormality.  Status post tPA.  MRI showed left frontal patchy small infarcts.   EF 60 to 65%, DVT negative.  Loop recorder placed.  LDL 156, A1c 5.7.  She was discharged with DAPT and  Lipitor 80.  Carotid atherosclerosis 10/2019 CTA head and neck aortic atherosclerosis and left ICA mixed plaques but no significant stenosis.  This admission CT head and neck no significant change Vascular surgery Dr Randie Heinz consulted, plan for left TCAR on Thursday  Hypertension Home meds:  norvasc, coreg Stable Long-term BP goal normotensive  Hyperlipidemia Home meds:  Lipitor 80mg ,  LDL 131, goal < 70 Continue statin at discharge  Other Stroke Risk Factors Advanced Age >/= 56  Hx stroke/TIA  Other Issues Mild dilatation of the ascending aorta, measuring 37 mm by cardiac echo Leukocytosis - WBC's -  11.3->13.4 (temp - 99.6) -> repeat CBC in AM AKI, Creatinine - 1.27-1.10->1.16    Hospital day # 1  07-24-1971, MD PhD Stroke Neurology 06/06/2021 10:22 PM   To contact Stroke Continuity provider, please refer to 06/08/2021. After hours, contact General Neurology

## 2021-06-06 NOTE — Progress Notes (Signed)
  Progress Note    06/06/2021 9:37 AM * No surgery date entered *  Subjective: No overnight issues, no complaints today  Vitals:   06/05/21 2337 06/06/21 0852  BP: (!) 176/89 (!) 157/77  Pulse: 72 78  Resp: 17 18  Temp: 98.7 F (37.1 C) 98.2 F (36.8 C)  SpO2: 99% 98%    Physical Exam: Awake alert oriented None the respirations No gross motor weakness on today's exam  CBC    Component Value Date/Time   WBC 13.4 (H) 06/04/2021 1141   RBC 4.33 06/04/2021 1141   HGB 13.3 06/04/2021 1141   HCT 38.9 06/04/2021 1141   PLT 308 06/04/2021 1141   MCV 89.8 06/04/2021 1141   MCH 30.7 06/04/2021 1141   MCHC 34.2 06/04/2021 1141   RDW 12.6 06/04/2021 1141   LYMPHSABS 2.6 06/04/2021 1141   MONOABS 1.1 (H) 06/04/2021 1141   EOSABS 0.1 06/04/2021 1141   BASOSABS 0.0 06/04/2021 1141    BMET    Component Value Date/Time   NA 139 06/06/2021 0341   K 3.9 06/06/2021 0341   CL 107 06/06/2021 0341   CO2 26 06/06/2021 0341   GLUCOSE 109 (H) 06/06/2021 0341   BUN 18 06/06/2021 0341   CREATININE 1.16 (H) 06/06/2021 0341   CALCIUM 8.3 (L) 06/06/2021 0341   GFRNONAA 50 (L) 06/06/2021 0341   GFRAA 42 (L) 12/02/2019 0549    INR    Component Value Date/Time   INR 0.9 06/03/2021 2200    No intake or output data in the 24 hours ending 06/06/21 8786   Assessment/plan:  75 y.o. female is here with left cortical stroke and significant soft plaque left ICA.  Plan will be for left transcarotid artery stenting on Thursday.  Continue aspirin, Plavix and statin.  She will be n.p.o. past midnight on Wednesday.    Aviv Rota C. Randie Heinz, MD [facility] Office: [phone] Pager: [phone]  06/06/2021 9:37 AM

## 2021-06-07 LAB — COMPREHENSIVE METABOLIC PANEL
ALT: 15 U/L (ref 0–44)
AST: 23 U/L (ref 15–41)
Albumin: 3 g/dL — ABNORMAL LOW (ref 3.5–5.0)
Alkaline Phosphatase: 44 U/L (ref 38–126)
Anion gap: 9 (ref 5–15)
BUN: 17 mg/dL (ref 8–23)
CO2: 26 mmol/L (ref 22–32)
Calcium: 8.8 mg/dL — ABNORMAL LOW (ref 8.9–10.3)
Chloride: 104 mmol/L (ref 98–111)
Creatinine, Ser: 1.12 mg/dL — ABNORMAL HIGH (ref 0.44–1.00)
GFR, Estimated: 52 mL/min — ABNORMAL LOW (ref >60)
Glucose, Bld: 95 mg/dL (ref 70–99)
Potassium: 3.6 mmol/L (ref 3.5–5.1)
Sodium: 139 mmol/L (ref 135–145)
Total Bilirubin: 1.1 mg/dL (ref 0.3–1.2)
Total Protein: 5.8 g/dL — ABNORMAL LOW (ref 6.5–8.1)

## 2021-06-07 LAB — CBC
HCT: 35 % — ABNORMAL LOW (ref 36.0–46.0)
Hemoglobin: 12 g/dL (ref 12.0–15.0)
MCH: 30.8 pg (ref 26.0–34.0)
MCHC: 34.3 g/dL (ref 30.0–36.0)
MCV: 90 fL (ref 80.0–100.0)
Platelets: 279 10*3/uL (ref 150–400)
RBC: 3.89 MIL/uL (ref 3.87–5.11)
RDW: 12.4 % (ref 11.5–15.5)
WBC: 8.5 10*3/uL (ref 4.0–10.5)
nRBC: 0 % (ref 0.0–0.2)

## 2021-06-07 MED ORDER — SODIUM CHLORIDE 0.9 % IV SOLN
1.5000 g | INTRAVENOUS | Status: DC
  Filled 2021-06-07 (×2): qty 1.5

## 2021-06-07 NOTE — Progress Notes (Signed)
Occupational Therapy Treatment Patient Details Name: Denise Leon MRN: [id number] DOB: December 03, 1945 Today's Date: 06/07/2021    History of present illness This 75 y.o. female admitted with increased Rt sided weakness on 06/03/21. 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  Pt with endarterectomy scheduled for 8/25.   PMH includes CVA with Rt residual weakness, asthma, CKD, HTN, palpitations   OT comments  Pt received in bed, agreeable to OT session. Interpreter utilized throughout Midwife [phone]. Pt demonstrates cognitive limitations scoring 24/28 on the short blessed test. Pt demonstrates RUE functional limitations and RLE weakness. She continues to require minA at [facility] level for grooming at sink level and for toileting. Pt will continue to benefit from skilled OT services to maximize safety and independence with ADL/IADL and functional mobility. Will continue to follow acutely and progress as tolerated.    Follow Up Recommendations  CIR    Equipment Recommendations  None recommended by OT    Recommendations for Other Services      Precautions / Restrictions Precautions Precautions: Fall Restrictions Weight Bearing Restrictions: No       Mobility Bed Mobility Overal bed mobility: Needs Assistance Bed Mobility: Supine to Sit;Sit to Supine     Supine to sit: Supervision Sit to supine: Supervision   General bed mobility comments: cues for use of bed rails and to progress hips to EOB    Transfers Overall transfer level: Needs assistance Equipment used: Rolling walker (2 wheeled) Transfers: Sit to/from Stand Sit to Stand: Min assist         General transfer comment: minA for safety and stability    Balance Overall balance assessment: Needs assistance Sitting-balance support: Feet supported Sitting balance-Leahy Scale: Good     Standing balance support: Bilateral upper extremity supported Standing  balance-Leahy Scale: Poor Standing balance comment: Requirng UE support                           ADL either performed or assessed with clinical judgement   ADL Overall ADL's : Needs assistance/impaired     Grooming: Wash/dry hands;Wash/dry face;Oral care;Brushing hair;Minimal assistance;Standing Grooming Details (indicate cue type and reason): completed while standing at sink level                 Toilet Transfer: Minimal Production designer, theatre/television/film Details (indicate cue type and reason): pt voided, provided BSC over toilet, pt with increased steadiness and control Toileting- Clothing Manipulation and Hygiene: Minimal assistance;Sit to/from stand       Functional mobility during ADLs: Minimal assistance;Rolling walker General ADL Comments: cues for safe use of RW, stability, cues for sequencing.     Vision   Vision Assessment?: Yes Eye Alignment: Within Functional Limits Alignment/Gaze Preference: Head tilt Tracking/Visual Pursuits: Unable to hold eye position out of midline;Requires cues, head turns, or add eye shifts to track;Decreased smoothness of horizontal tracking Saccades: Decreased speed of saccadic movement Convergence: Within functional limits Additional Comments: continue to assess, utilized interpreter for visual assessment   Perception     Praxis      Cognition Arousal/Alertness: Awake/Leon Behavior During Therapy: Flat affect Overall Cognitive Status: No family/caregiver present to determine baseline cognitive functioning                                 General Comments: Increased need for multimodal cues, anticipate partially  due to language barrier. Utilized interpreter to further assess cognition, pt scored 24/28 demonstrating significant limitations with memory, following commands, attention, etc. 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 Interpreter used to  ensure no limitations with language barrier.        Exercises General Exercises - Lower Extremity Ankle Circles/Pumps: AROM;Both;10 reps;Seated Long Arc Quad: AROM;Right;10 reps;Seated Heel Slides: AROM;Both;10 reps;Supine Hip ABduction/ADduction: AROM;Both;10 reps;Supine Straight Leg Raises: AROM;Both;10 reps;Supine Other Exercises Other Exercises: Partial bridging x 5  in supine, assist to maintain R LE positioning (limiting ER) Other Exercises: Sit to stand x 5 with R leg favored (L leg forward) for strengthening: cues for hand placement and safety   Shoulder Instructions       General Comments VSS    Pertinent Vitals/ Pain       Pain Assessment: No/denies pain Faces Pain Scale: No hurt  Home Living                                          Prior Functioning/Environment              Frequency  Min 2X/week        Progress Toward Goals  OT Goals(current goals can now be found in the care plan section)  Progress towards OT goals: Progressing toward goals  Acute Rehab OT Goals Patient Stated Goal: did not state OT Goal Formulation: With patient Time For Goal Achievement: 06/18/21 Potential to Achieve Goals: Good ADL Goals Pt Will Perform Grooming: with supervision;standing Pt Will Perform Upper Body Bathing: with set-up;with supervision;sitting Pt Will Perform Lower Body Bathing: with supervision;sit to/from stand Pt Will Perform Upper Body Dressing: with supervision;with set-up;sitting Pt Will Perform Lower Body Dressing: with supervision;sit to/from stand Pt Will Transfer to Toilet: with supervision;ambulating;regular height toilet;bedside commode;grab bars Pt Will Perform Toileting - Clothing Manipulation and hygiene: with supervision;sit to/from stand Pt Will Perform Tub/Shower Transfer: Tub transfer;with supervision;ambulating;shower seat;rolling walker  Plan Discharge plan needs to be updated    Co-evaluation                  AM-PAC OT "6 Clicks" Daily Activity     Outcome Measure   Help from another person eating meals?: A Little Help from another person taking care of personal grooming?: A Little Help from another person toileting, which includes using toliet, bedpan, or urinal?: A Little Help from another person bathing (including washing, rinsing, drying)?: A Little Help from another person to put on and taking off regular upper body clothing?: A Little Help from another person to put on and taking off regular lower body clothing?: A Little 6 Click Score: 18    End of Session Equipment Utilized During Treatment: Gait belt;Rolling walker  OT Visit Diagnosis: Unsteadiness on feet (R26.81);Hemiplegia and hemiparesis Hemiplegia - Right/Left: Right Hemiplegia - dominant/non-dominant: Dominant Hemiplegia - caused by: Cerebral infarction   Activity Tolerance Patient tolerated treatment well   Patient Left with call bell/phone within reach;in chair;with chair alarm set   Nurse Communication Mobility status        Time: 0947-0962 OT Time Calculation (min): 33 min  Charges: OT General Charges $OT Visit: 1 Visit OT Treatments $Self Care/Home Management : 23-37 mins  Rosey Bath OTR/L Acute Rehabilitation Services Office: [phone]    Denise Leon 06/07/2021, 3:17 PM

## 2021-06-07 NOTE — Progress Notes (Signed)
Physical Therapy Treatment Patient Details Name: Denise Leon MRN: [id number] DOB: December 25, 1945 Today's Date: 06/07/2021    History of Present Illness This 75 y.o. female admitted with increased Rt sided weakness on 06/03/21. 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  Pt with endarterectomy scheduled for 8/25.   PMH includes CVA with Rt residual weakness, asthma, CKD, HTN, palpitations    PT Comments    Pt making good progress.  Able to increase gait distance but does still demonstrate R deficits with gait.  Good participation with strengthening for R LE with sit to stands and bed exercise.  Does require multimodal cues for safety.    Follow Up Recommendations  CIR     Equipment Recommendations  None recommended by PT    Recommendations for Other Services Rehab consult     Precautions / Restrictions Precautions Precautions: Fall    Mobility  Bed Mobility Overal bed mobility: Needs Assistance Bed Mobility: Supine to Sit;Sit to Supine     Supine to sit: Supervision Sit to supine: Supervision   General bed mobility comments: cues for use of bed rails and to progress hips to EOB    Transfers Overall transfer level: Needs assistance Equipment used: Rolling walker (2 wheeled) Transfers: Sit to/from Stand Sit to Stand: Min assist         General transfer comment: Sit to stand x 3 for transfers during session.  Cues for safe hand placement and controlled descent.  See exercise for further sit to stand  Ambulation/Gait Ambulation/Gait assistance: Min assist Gait Distance (Feet): 60 Feet Assistive device: Rolling walker (2 wheeled)   Gait velocity: decreased   General Gait Details: min A for steadying, with slow, shuffling gait, decreased weight shift to R, lower step height R than L. Unsafe with turning requiring cues to stay close to RW and assist with RW   Stairs             Wheelchair Mobility     Modified Rankin (Stroke Patients Only) Modified Rankin (Stroke Patients Only) Pre-Morbid Rankin Score: Moderate disability Modified Rankin: Moderately severe disability     Balance Overall balance assessment: Needs assistance Sitting-balance support: Feet supported Sitting balance-Leahy Scale: Good     Standing balance support: Bilateral upper extremity supported Standing balance-Leahy Scale: Poor Standing balance comment: Requirng UE support                            Cognition Arousal/Alertness: Awake/alert Behavior During Therapy: Flat affect Overall Cognitive Status: No family/caregiver present to determine baseline cognitive functioning                                 General Comments: Increased need for multimodal cues, anticipate partially due to language barrier. Pt declined use of stratus interpreter.  She could respond appropriatly to basic questions and request in [place].      Exercises General Exercises - Lower Extremity Ankle Circles/Pumps: AROM;Both;10 reps;Seated Long Arc Quad: AROM;Right;10 reps;Seated Heel Slides: AROM;Both;10 reps;Supine Hip ABduction/ADduction: AROM;Both;10 reps;Supine Straight Leg Raises: AROM;Both;10 reps;Supine Other Exercises Other Exercises: Partial bridging x 5  in supine, assist to maintain R LE positioning (limiting ER) Other Exercises: Sit to stand x 5 with R leg favored (L leg forward) for strengthening: cues for hand placement and safety    General Comments General comments (skin integrity, edema,  etc.): VSS      Pertinent Vitals/Pain Pain Assessment: No/denies pain    Home Living                      Prior Function            PT Goals (current goals can now be found in the care plan section) Progress towards PT goals: Progressing toward goals    Frequency    Min 4X/week      PT Plan Current plan remains appropriate    Co-evaluation              AM-PAC PT "6  Clicks" Mobility   Outcome Measure  Help needed turning from your back to your side while in a flat bed without using bedrails?: None Help needed moving from lying on your back to sitting on the side of a flat bed without using bedrails?: None Help needed moving to and from a bed to a chair (including a wheelchair)?: A Little Help needed standing up from a chair using your arms (e.g., wheelchair or bedside chair)?: A Little Help needed to walk in hospital room?: A Little Help needed climbing 3-5 steps with a railing? 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

## 2021-06-07 NOTE — Progress Notes (Addendum)
  Progress Note    06/07/2021 3:21 PM * No surgery date entered *  Subjective: No complaints  Vitals:   06/07/21 0836 06/07/21 1125  BP: (!) 169/80 (!) 177/86  Pulse: 69 68  Resp: 18 17  Temp: 98.6 F (37 C) 98.4 F (36.9 C)  SpO2: 99% 97%    Physical Exam: Awake alert oriented nonlabored respirations Some weakness identifiable right upper extremity relative to the left  CBC    Component Value Date/Time   WBC 8.5 06/07/2021 0236   RBC 3.89 06/07/2021 0236   HGB 12.0 06/07/2021 0236   HCT 35.0 (L) 06/07/2021 0236   PLT 279 06/07/2021 0236   MCV 90.0 06/07/2021 0236   MCH 30.8 06/07/2021 0236   MCHC 34.3 06/07/2021 0236   RDW 12.4 06/07/2021 0236   LYMPHSABS 2.6 06/04/2021 1141   MONOABS 1.1 (H) 06/04/2021 1141   EOSABS 0.1 06/04/2021 1141   BASOSABS 0.0 06/04/2021 1141    BMET    Component Value Date/Time   NA 139 06/07/2021 0236   K 3.6 06/07/2021 0236   CL 104 06/07/2021 0236   CO2 26 06/07/2021 0236   GLUCOSE 95 06/07/2021 0236   BUN 17 06/07/2021 0236   CREATININE 1.12 (H) 06/07/2021 0236   CALCIUM 8.8 (L) 06/07/2021 0236   GFRNONAA 52 (L) 06/07/2021 0236   GFRAA 42 (L) 12/02/2019 0549    INR    Component Value Date/Time   INR 0.9 06/03/2021 2200     Intake/Output Summary (Last 24 hours) at 06/07/2021 1521 Last data filed at 06/07/2021 0900 Gross per 24 hour  Intake 3577.42 ml  Output --  Net 3577.42 ml     Assessment/plan:  74 y.o. female is here with symptomatic left ICA lesion.  Plans for left transcarotid artery stenting tomorrow in the OR.  Continue aspirin, Plavix and statin.   Azariah Bonura C. Ambry Dix, MD [facility] [place] Office: [phone] Pager: [phone]  06/07/2021 3:21 PM    

## 2021-06-07 NOTE — Progress Notes (Signed)
[facility] PROGRESS NOTE  Patient: Denise Leon GYB:[id number]   PCP: Patient, No Pcp Per (Inactive) DOB: 1946/04/24   DOA: 06/03/2021   DOS: 06/07/2021    Subjective: Denies any acute complaint.  Unable to hear adequately.  Unable to hear the interpreter.  No nausea no vomiting no chest pain abdominal pain.  Does not know why she is here does not know what procedure she is going through tomorrow.  Objective:  Vitals:   06/07/21 1545 06/07/21 1950  BP: (!) 161/96 (!) 160/98  Pulse: 81 84  Resp: 18 20  Temp: 99.3 F (37.4 C) 98.9 F (37.2 C)  SpO2: 100% 100%    S1-S2 present. Clear to auscultation. Bowel sounds present.  No abdominal tenderness. No edema. Right-sided weakness improving.  Assessment and plan: Left ischemic stroke. Will be undergoing right carotid endarterectomy tomorrow. N.p.o. after midnight.  Hypertension. Allowing permissive hypertension. Monitor.  CKD 3a At risk for further worsening postprocedure. Monitor.  Dementia Suspect the patient is actually suffering from dementia rather than having language barrier. Although unable to verify that without presence of family.  Author: Lynden Oxford, MD [facility] 06/07/2021 8:32 PM   If 7PM-7AM, please contact night-coverage at [email or web address]

## 2021-06-07 NOTE — Progress Notes (Signed)
STROKE TEAM PROGRESS NOTE   INTERVAL HISTORY No family at bedside.  Patient sitting in chair, no complaints, neuro stable.  Pending TCAR procedure tomorrow with vascular surgery.  Vitals:   06/07/21 0355 06/07/21 0836 06/07/21 1125 06/07/21 1545  BP: (!) 169/95 (!) 169/80 (!) 177/86 (!) 161/96  Pulse: 73 69 68 81  Resp: 17 18 17 18   Temp: 98 F (36.7 C) 98.6 F (37 C) 98.4 F (36.9 C) 99.3 F (37.4 C)  TempSrc:  Axillary Oral Oral  SpO2: 98% 99% 97% 100%  Weight:      Height:       CBC:  Recent Labs  Lab 06/03/21 2200 06/04/21 1141 06/07/21 0236  WBC 11.3* 13.4* 8.5  NEUTROABS 6.7 9.5*  --   HGB 12.2 13.3 12.0  HCT 35.9* 38.9 35.0*  MCV 90.2 89.8 90.0  PLT 287 308 279   Basic Metabolic Panel:  Recent Labs  Lab 06/06/21 0341 06/07/21 0236  NA 139 139  K 3.9 3.6  CL 107 104  CO2 26 26  GLUCOSE 109* 95  BUN 18 17  CREATININE 1.16* 1.12*  CALCIUM 8.3* 8.8*   Lipid Panel:  Recent Labs  Lab 06/04/21 1141  CHOL 206*  TRIG 124  HDL 50  CHOLHDL 4.1  VLDL 25  LDLCALC 06/06/21*   HgbA1c:  Recent Labs  Lab 06/04/21 1141  HGBA1C 5.8*   Urine Drug Screen:  Recent Labs  Lab 06/04/21 0127  LABOPIA NONE DETECTED  COCAINSCRNUR NONE DETECTED  LABBENZ NONE DETECTED  AMPHETMU NONE DETECTED  THCU NONE DETECTED  LABBARB NONE DETECTED    Alcohol Level  Recent Labs  Lab 06/03/21 2200  ETH <10    IMAGING past 24 hours No results found.  PHYSICAL EXAM  Temp:  [98 F (36.7 C)-99.3 F (37.4 C)] 99.3 F (37.4 C) (08/24 1545) Pulse Rate:  [68-82] 81 (08/24 1545) Resp:  [17-18] 18 (08/24 1545) BP: (161-177)/(80-98) 161/96 (08/24 1545) SpO2:  [97 %-100 %] 100 % (08/24 1545)  General - Well nourished, well developed, in no apparent distress.  Ophthalmologic - fundi not visualized due to noncooperation.  Cardiovascular - Regular rhythm and rate.  Neuro -  awake, alert, eyes open, hard of hearing, orientated to age, place, but not to time. No aphasia, but  paucity of speech given language barrier, following all simple commands. Able to name 3/5 and repeat simple sentences. No gaze palsy, tracking bilaterally, visual field full, PERRL. Right mild facial droop. Tongue midline.  LUE 4/5 and RUE proximal 3+/5 and distal 3+/5. Bilaterally LEs 4/5 proximal and distal, no drift. Sensation symmetrical bilaterally, right FTN mild ataxia somehow out of proportion to the weakness, gait not tested.   ASSESSMENT/PLAN Denise Leon is a 75 y.o. female with history of Left patchy frontal CVA with tPA, CKD III, HTN, HLD, sensorineural hearing loss, back pain, palpitations s/p Loop recorder, asthma, Left MCA stroke, and thyroid nodule presenting with increased right upper extremity weakness and facial numbness.   Stroke:  Acute Left  parietal-occipital infarct secondary to left ICA high risk plaque CT head Chronic ischemic microangiopathy and generalized atrophy without acute intracranial abnormality. ASPECTS 10.    CTA head & neck aortic atherosclerosis and left ICA mixed plaques but no significant stenosis MRI Patchy acute ischemic infarcts involving the left parieto-occipital region as above. Associated petechial hemorrhage without frank intraparenchymal hematoma. age-related cerebral atrophy with moderate to advanced chronic microvascular ischemic disease. 2D Echo - EF 55 - 60%. No  cardiac source of emboli identified.  Loop recorder no A fib LDL 131 HgbA1c 5.8 VTE prophylaxis - Lovenox/SCD'd aspirin 325 mg daily prior to admission, now on aspirin 325 mg daily and clopidogrel 75 mg daily.  Duration per vascular surgery Therapy recommendations:  CIR  Disposition:  pending   History of stroke In 10/2019 patient admitted for right-sided weakness and right facial droop.  CT no acute abnormality.  Status post tPA.  MRI showed left frontal patchy small infarcts.   EF 60 to 65%, DVT negative.  Loop recorder placed.  LDL 156, A1c 5.7.  She was discharged with DAPT and  Lipitor 80.  Carotid atherosclerosis 10/2019 CTA head and neck aortic atherosclerosis and left ICA mixed plaques but no significant stenosis.  This admission CT head and neck no significant change Vascular surgery Dr Randie Heinz consulted, plan for left TCAR tomorrow  Hypertension Home meds:  norvasc, coreg Stable Long-term BP goal normotensive  Hyperlipidemia Home meds:  Lipitor 80mg ,  LDL 131, goal < 70 Continue statin at discharge  Other Stroke Risk Factors Advanced Age >/= 8  Hx stroke/TIA  Other Issues Mild dilatation of the ascending aorta, measuring 37 mm by cardiac echo Leukocytosis - WBC's -  11.3->13.4->8.5 (temp - 99.6->99.3)  AKI, Creatinine - 1.27-1.10->1.16->1.12   Hospital day # 2  07-24-1971, MD PhD Stroke Neurology 06/07/2021 4:24 PM    To contact Stroke Continuity provider, please refer to 06/09/2021. After hours, contact General Neurology

## 2021-06-07 NOTE — H&P (View-Only) (Signed)
  Progress Note    06/07/2021 3:21 PM * No surgery date entered *  Subjective: No complaints  Vitals:   06/07/21 0836 06/07/21 1125  BP: (!) 169/80 (!) 177/86  Pulse: 69 68  Resp: 18 17  Temp: 98.6 F (37 C) 98.4 F (36.9 C)  SpO2: 99% 97%    Physical Exam: Awake alert oriented nonlabored respirations Some weakness identifiable right upper extremity relative to the left  CBC    Component Value Date/Time   WBC 8.5 06/07/2021 0236   RBC 3.89 06/07/2021 0236   HGB 12.0 06/07/2021 0236   HCT 35.0 (L) 06/07/2021 0236   PLT 279 06/07/2021 0236   MCV 90.0 06/07/2021 0236   MCH 30.8 06/07/2021 0236   MCHC 34.3 06/07/2021 0236   RDW 12.4 06/07/2021 0236   LYMPHSABS 2.6 06/04/2021 1141   MONOABS 1.1 (H) 06/04/2021 1141   EOSABS 0.1 06/04/2021 1141   BASOSABS 0.0 06/04/2021 1141    BMET    Component Value Date/Time   NA 139 06/07/2021 0236   K 3.6 06/07/2021 0236   CL 104 06/07/2021 0236   CO2 26 06/07/2021 0236   GLUCOSE 95 06/07/2021 0236   BUN 17 06/07/2021 0236   CREATININE 1.12 (H) 06/07/2021 0236   CALCIUM 8.8 (L) 06/07/2021 0236   GFRNONAA 52 (L) 06/07/2021 0236   GFRAA 42 (L) 12/02/2019 0549    INR    Component Value Date/Time   INR 0.9 06/03/2021 2200     Intake/Output Summary (Last 24 hours) at 06/07/2021 1521 Last data filed at 06/07/2021 0900 Gross per 24 hour  Intake 3577.42 ml  Output --  Net 3577.42 ml     Assessment/plan:  75 y.o. female is here with symptomatic left ICA lesion.  Plans for left transcarotid artery stenting tomorrow in the OR.  Continue aspirin, Plavix and statin.   Dollye Glasser C. Randie Heinz, MD [facility] [place] Office: [phone] Pager: [phone]  06/07/2021 3:21 PM

## 2021-06-07 NOTE — Plan of Care (Signed)

## 2021-06-08 ENCOUNTER — Inpatient Hospital Stay (HOSPITAL_COMMUNITY): Payer: Medicare Other

## 2021-06-08 ENCOUNTER — Inpatient Hospital Stay (HOSPITAL_COMMUNITY): Payer: Medicare Other | Admitting: Certified Registered"

## 2021-06-08 ENCOUNTER — Encounter (HOSPITAL_COMMUNITY)
Admission: EM | Disposition: A | Discharge status: Rehabilitation Facility | Payer: Self-pay | Source: Home / Self Care | Attending: Internal Medicine

## 2021-06-08 ENCOUNTER — Encounter (HOSPITAL_COMMUNITY): Payer: Self-pay | Admitting: Family Medicine

## 2021-06-08 DIAGNOSIS — Z20822 Contact with and (suspected) exposure to covid-19: Secondary | ICD-10-CM | POA: Diagnosis not present

## 2021-06-08 DIAGNOSIS — G8191 Hemiplegia, unspecified affecting right dominant side: Secondary | ICD-10-CM | POA: Diagnosis not present

## 2021-06-08 DIAGNOSIS — Z006 Encounter for examination for normal comparison and control in clinical research program: Secondary | ICD-10-CM | POA: Diagnosis not present

## 2021-06-08 DIAGNOSIS — I63232 Cerebral infarction due to unspecified occlusion or stenosis of left carotid arteries: Secondary | ICD-10-CM | POA: Diagnosis not present

## 2021-06-08 HISTORY — PX: TRANSCAROTID ARTERY REVASCULARIZATIONÂ: SHX6778

## 2021-06-08 HISTORY — PX: ULTRASOUND GUIDANCE FOR VASCULAR ACCESS: SHX6516

## 2021-06-08 LAB — CBC
HCT: 35.4 % — ABNORMAL LOW (ref 36.0–46.0)
Hemoglobin: 12.1 g/dL (ref 12.0–15.0)
MCH: 30.8 pg (ref 26.0–34.0)
MCHC: 34.2 g/dL (ref 30.0–36.0)
MCV: 90.1 fL (ref 80.0–100.0)
Platelets: 264 10*3/uL (ref 150–400)
RBC: 3.93 MIL/uL (ref 3.87–5.11)
RDW: 12.3 % (ref 11.5–15.5)
WBC: 8.4 10*3/uL (ref 4.0–10.5)
nRBC: 0 % (ref 0.0–0.2)

## 2021-06-08 LAB — SURGICAL PCR SCREEN
MRSA, PCR: NEGATIVE
Staphylococcus aureus: NEGATIVE

## 2021-06-08 LAB — BASIC METABOLIC PANEL
Anion gap: 7 (ref 5–15)
BUN: 17 mg/dL (ref 8–23)
CO2: 23 mmol/L (ref 22–32)
Calcium: 8.5 mg/dL — ABNORMAL LOW (ref 8.9–10.3)
Chloride: 108 mmol/L (ref 98–111)
Creatinine, Ser: 0.97 mg/dL (ref 0.44–1.00)
GFR, Estimated: >60 mL/min (ref >60)
Glucose, Bld: 103 mg/dL — ABNORMAL HIGH (ref 70–99)
Potassium: 3.5 mmol/L (ref 3.5–5.1)
Sodium: 138 mmol/L (ref 135–145)

## 2021-06-08 LAB — ABO/RH: ABO/RH(D): O POS

## 2021-06-08 LAB — TYPE AND SCREEN
ABO/RH(D): O POS
Antibody Screen: NEGATIVE

## 2021-06-08 LAB — POCT ACTIVATED CLOTTING TIME: Activated Clotting Time: 306 seconds

## 2021-06-08 SURGERY — TRANSCAROTID ARTERY REVASCULARIZATION (TCAR)
Anesthesia: General | Site: Neck | Laterality: Right

## 2021-06-08 MED ORDER — PANTOPRAZOLE SODIUM 40 MG PO TBEC
40.0000 mg | DELAYED_RELEASE_TABLET | Freq: Every day | ORAL | Status: DC
  Administered 2021-06-09 – 2021-06-10 (×2): 40 mg via ORAL
  Filled 2021-06-08 (×2): qty 1

## 2021-06-08 MED ORDER — PHENOL 1.4 % MT LIQD: 1.0000 | OROMUCOSAL | Status: DC | PRN

## 2021-06-08 MED ORDER — LABETALOL HCL 5 MG/ML IV SOLN
10.0000 mg | INTRAVENOUS | Status: DC | PRN
Start: 2021-06-08 — End: 2021-06-08

## 2021-06-08 MED ORDER — LABETALOL HCL 5 MG/ML IV SOLN
5.0000 mg | Freq: Once | INTRAVENOUS | Status: AC
  Administered 2021-06-08: 5 mg via INTRAVENOUS

## 2021-06-08 MED ORDER — ORAL CARE MOUTH RINSE: 15.0000 mL | Freq: Once | OROMUCOSAL | Status: AC

## 2021-06-08 MED ORDER — SODIUM CHLORIDE (PF) 0.9 % IJ SOLN
INTRAMUSCULAR | Status: AC
  Filled 2021-06-08: qty 10

## 2021-06-08 MED ORDER — LACTATED RINGERS IV SOLN: INTRAVENOUS | Status: DC

## 2021-06-08 MED ORDER — FENTANYL CITRATE (PF) 100 MCG/2ML IJ SOLN: 25.0000 ug | INTRAMUSCULAR | Status: DC | PRN

## 2021-06-08 MED ORDER — SUGAMMADEX SODIUM 200 MG/2ML IV SOLN
INTRAVENOUS | Status: DC | PRN
  Administered 2021-06-08: 200 mg via INTRAVENOUS

## 2021-06-08 MED ORDER — HYDROMORPHONE HCL 1 MG/ML IJ SOLN
0.5000 mg | INTRAMUSCULAR | Status: DC | PRN
  Administered 2021-06-08: 1 mg via INTRAVENOUS
  Filled 2021-06-08: qty 1

## 2021-06-08 MED ORDER — SENNOSIDES-DOCUSATE SODIUM 8.6-50 MG PO TABS: 1.0000 | ORAL_TABLET | Freq: Every evening | ORAL | Status: DC | PRN

## 2021-06-08 MED ORDER — GLYCOPYRROLATE PF 0.2 MG/ML IJ SOSY
PREFILLED_SYRINGE | INTRAMUSCULAR | Status: AC
  Filled 2021-06-08: qty 1

## 2021-06-08 MED ORDER — HYDRALAZINE HCL 20 MG/ML IJ SOLN
10.0000 mg | Freq: Once | INTRAMUSCULAR | Status: AC
  Administered 2021-06-08: 10 mg via INTRAVENOUS

## 2021-06-08 MED ORDER — SODIUM CHLORIDE 0.9 % IV SOLN
INTRAVENOUS | Status: DC | PRN
  Administered 2021-06-08: 1.5 g via INTRAVENOUS

## 2021-06-08 MED ORDER — PHENYLEPHRINE HCL-NACL 20-0.9 MG/250ML-% IV SOLN
INTRAVENOUS | Status: DC | PRN
Start: 2021-06-08 — End: 2021-06-08
  Administered 2021-06-08: 25 ug/min via INTRAVENOUS

## 2021-06-08 MED ORDER — OXYCODONE HCL 5 MG PO TABS: 5.0000 mg | ORAL_TABLET | Freq: Once | ORAL | Status: DC | PRN

## 2021-06-08 MED ORDER — IODIXANOL 320 MG/ML IV SOLN
INTRAVENOUS | Status: DC | PRN
  Administered 2021-06-08: 22 mL via INTRA_ARTERIAL

## 2021-06-08 MED ORDER — SUFENTANIL CITRATE 50 MCG/ML IV SOLN
INTRAVENOUS | Status: AC
  Filled 2021-06-08: qty 1

## 2021-06-08 MED ORDER — ONDANSETRON HCL 4 MG/2ML IJ SOLN
INTRAMUSCULAR | Status: AC
  Filled 2021-06-08: qty 2

## 2021-06-08 MED ORDER — MAGNESIUM SULFATE 2 GM/50ML IV SOLN: 2.0000 g | Freq: Every day | INTRAVENOUS | Status: DC | PRN

## 2021-06-08 MED ORDER — HYDRALAZINE HCL 20 MG/ML IJ SOLN
INTRAMUSCULAR | Status: AC
  Filled 2021-06-08: qty 1

## 2021-06-08 MED ORDER — CHLORHEXIDINE GLUCONATE 0.12 % MT SOLN: 15.0000 mL | Freq: Once | OROMUCOSAL | Status: AC

## 2021-06-08 MED ORDER — ACETAMINOPHEN 500 MG PO TABS
1000.0000 mg | ORAL_TABLET | Freq: Four times a day (QID) | ORAL | Status: DC
  Administered 2021-06-08: 1000 mg via ORAL
  Filled 2021-06-08: qty 2

## 2021-06-08 MED ORDER — PROTAMINE SULFATE 10 MG/ML IV SOLN
INTRAVENOUS | Status: DC | PRN
  Administered 2021-06-08: 50 mg via INTRAVENOUS

## 2021-06-08 MED ORDER — HEMOSTATIC AGENTS (NO CHARGE) OPTIME
TOPICAL | Status: DC | PRN
  Administered 2021-06-08: 1 via TOPICAL

## 2021-06-08 MED ORDER — METOPROLOL TARTRATE 5 MG/5ML IV SOLN
INTRAVENOUS | Status: DC | PRN
  Administered 2021-06-08: 1 mg via INTRAVENOUS

## 2021-06-08 MED ORDER — ACETAMINOPHEN 500 MG PO TABS: 1000.0000 mg | ORAL_TABLET | Freq: Once | ORAL | Status: DC | PRN

## 2021-06-08 MED ORDER — PHENYLEPHRINE 40 MCG/ML (10ML) SYRINGE FOR IV PUSH (FOR BLOOD PRESSURE SUPPORT)
PREFILLED_SYRINGE | INTRAVENOUS | Status: AC
  Filled 2021-06-08: qty 10

## 2021-06-08 MED ORDER — GLYCOPYRROLATE 0.2 MG/ML IJ SOLN
INTRAMUSCULAR | Status: DC | PRN
  Administered 2021-06-08: .2 mg via INTRAVENOUS

## 2021-06-08 MED ORDER — ONDANSETRON HCL 4 MG/2ML IJ SOLN
INTRAMUSCULAR | Status: DC | PRN
  Administered 2021-06-08: 4 mg via INTRAVENOUS

## 2021-06-08 MED ORDER — LABETALOL HCL 5 MG/ML IV SOLN
INTRAVENOUS | Status: AC
  Filled 2021-06-08: qty 4

## 2021-06-08 MED ORDER — DOCUSATE SODIUM 100 MG PO CAPS: 100.0000 mg | ORAL_CAPSULE | Freq: Every day | ORAL | Status: DC

## 2021-06-08 MED ORDER — ALUM & MAG HYDROXIDE-SIMETH 200-200-20 MG/5ML PO SUSP: 15.0000 mL | ORAL | Status: DC | PRN

## 2021-06-08 MED ORDER — PROPOFOL 10 MG/ML IV BOLUS
INTRAVENOUS | Status: AC
  Filled 2021-06-08: qty 20

## 2021-06-08 MED ORDER — LIDOCAINE HCL (CARDIAC) PF 100 MG/5ML IV SOSY
PREFILLED_SYRINGE | INTRAVENOUS | Status: DC | PRN
  Administered 2021-06-08: 60 mg via INTRAVENOUS

## 2021-06-08 MED ORDER — LACTATED RINGERS IV SOLN: INTRAVENOUS | Status: DC | PRN

## 2021-06-08 MED ORDER — PROTAMINE SULFATE 10 MG/ML IV SOLN
INTRAVENOUS | Status: AC
  Filled 2021-06-08: qty 5

## 2021-06-08 MED ORDER — LIDOCAINE 2% (20 MG/ML) 5 ML SYRINGE
INTRAMUSCULAR | Status: AC
  Filled 2021-06-08: qty 5

## 2021-06-08 MED ORDER — ONDANSETRON HCL 4 MG/2ML IJ SOLN
4.0000 mg | Freq: Four times a day (QID) | INTRAMUSCULAR | Status: DC | PRN
  Administered 2021-06-08: 4 mg via INTRAVENOUS
  Filled 2021-06-08: qty 2

## 2021-06-08 MED ORDER — PROPOFOL 10 MG/ML IV BOLUS
INTRAVENOUS | Status: DC | PRN
  Administered 2021-06-08: 50 mg via INTRAVENOUS
  Administered 2021-06-08: 110 mg via INTRAVENOUS

## 2021-06-08 MED ORDER — CHLORHEXIDINE GLUCONATE 0.12 % MT SOLN
OROMUCOSAL | Status: AC
  Administered 2021-06-08: 15 mL via OROMUCOSAL
  Filled 2021-06-08: qty 15

## 2021-06-08 MED ORDER — OXYCODONE-ACETAMINOPHEN 5-325 MG PO TABS: 1.0000 | ORAL_TABLET | ORAL | Status: DC | PRN

## 2021-06-08 MED ORDER — HEPARIN SODIUM (PORCINE) 1000 UNIT/ML IJ SOLN
INTRAMUSCULAR | Status: DC | PRN
  Administered 2021-06-08: 7000 [IU] via INTRAVENOUS
  Administered 2021-06-08: 3000 [IU] via INTRAVENOUS

## 2021-06-08 MED ORDER — OXYCODONE HCL 5 MG/5ML PO SOLN: 5.0000 mg | Freq: Once | ORAL | Status: DC | PRN

## 2021-06-08 MED ORDER — HEPARIN 6000 UNIT IRRIGATION SOLUTION
Status: DC | PRN
  Administered 2021-06-08: 1

## 2021-06-08 MED ORDER — POTASSIUM CHLORIDE CRYS ER 20 MEQ PO TBCR
20.0000 meq | EXTENDED_RELEASE_TABLET | Freq: Every day | ORAL | Status: DC | PRN
Start: 2021-06-08 — End: 2021-06-09

## 2021-06-08 MED ORDER — GUAIFENESIN-DM 100-10 MG/5ML PO SYRP: 15.0000 mL | ORAL_SOLUTION | ORAL | Status: DC | PRN

## 2021-06-08 MED ORDER — SUFENTANIL CITRATE 50 MCG/ML IV SOLN
INTRAVENOUS | Status: DC | PRN
  Administered 2021-06-08: 15 ug via INTRAVENOUS
  Administered 2021-06-08: 5 ug via INTRAVENOUS

## 2021-06-08 MED ORDER — ACETAMINOPHEN 160 MG/5ML PO SOLN: 1000.0000 mg | Freq: Once | ORAL | Status: DC | PRN

## 2021-06-08 MED ORDER — ROCURONIUM BROMIDE 10 MG/ML (PF) SYRINGE
PREFILLED_SYRINGE | INTRAVENOUS | Status: AC
  Filled 2021-06-08: qty 10

## 2021-06-08 MED ORDER — DEXAMETHASONE SODIUM PHOSPHATE 10 MG/ML IJ SOLN
INTRAMUSCULAR | Status: AC
  Filled 2021-06-08: qty 1

## 2021-06-08 MED ORDER — ACETAMINOPHEN 10 MG/ML IV SOLN: 1000.0000 mg | Freq: Once | INTRAVENOUS | Status: DC | PRN

## 2021-06-08 MED ORDER — 0.9 % SODIUM CHLORIDE (POUR BTL) OPTIME
TOPICAL | Status: DC | PRN
  Administered 2021-06-08: 1000 mL

## 2021-06-08 MED ORDER — DEXAMETHASONE SODIUM PHOSPHATE 10 MG/ML IJ SOLN
INTRAMUSCULAR | Status: DC | PRN
  Administered 2021-06-08: 10 mg via INTRAVENOUS

## 2021-06-08 MED ORDER — ROCURONIUM 10MG/ML (10ML) SYRINGE FOR MEDFUSION PUMP - OPTIME
INTRAVENOUS | Status: DC | PRN
  Administered 2021-06-08: 70 mg via INTRAVENOUS

## 2021-06-08 MED ORDER — METOPROLOL TARTRATE 5 MG/5ML IV SOLN: 2.0000 mg | INTRAVENOUS | Status: DC | PRN

## 2021-06-08 MED ORDER — SODIUM CHLORIDE 0.9 % IV SOLN: 500.0000 mL | Freq: Once | INTRAVENOUS | Status: DC | PRN

## 2021-06-08 MED ORDER — HEPARIN SODIUM (PORCINE) 1000 UNIT/ML IJ SOLN
INTRAMUSCULAR | Status: AC
  Filled 2021-06-08: qty 1

## 2021-06-08 SURGICAL SUPPLY — 66 items
ADH SKN CLS APL DERMABOND .7 (GAUZE/BANDAGES/DRESSINGS) ×4
BAG BANDED W/RUBBER/TAPE 36X54 (MISCELLANEOUS) ×3 IMPLANT
BAG COUNTER SPONGE SURGICOUNT (BAG) ×3 IMPLANT
BAG EQP BAND 135X91 W/RBR TAPE (MISCELLANEOUS) ×2
BAG SPNG CNTER NS LX DISP (BAG) ×2
BALLN STERLING RX 5X30X80 (BALLOONS) ×3
BALLOON STERLING RX 5X30X80 (BALLOONS) IMPLANT
CANISTER SUCT 3000ML PPV (MISCELLANEOUS) ×3 IMPLANT
CATH BEACON 5 .035 40 KMP TP (CATHETERS) IMPLANT
CATH BEACON 5 .038 40 KMP TP (CATHETERS) ×1
CATH ROBINSON RED A/P 18FR (CATHETERS) IMPLANT
CLIP LIGATING EXTRA MED SLVR (CLIP) ×3 IMPLANT
CLIP LIGATING EXTRA SM BLUE (MISCELLANEOUS) ×3 IMPLANT
COVER DOME SNAP 22 D (MISCELLANEOUS) ×3 IMPLANT
COVER PROBE W GEL 5X96 (DRAPES) ×3 IMPLANT
DERMABOND ADVANCED (GAUZE/BANDAGES/DRESSINGS) ×2
DERMABOND ADVANCED .7 DNX12 (GAUZE/BANDAGES/DRESSINGS) ×2 IMPLANT
DRAPE FEMORAL ANGIO 80X135IN (DRAPES) ×3 IMPLANT
ELECT REM PT RETURN 9FT ADLT (ELECTROSURGICAL) ×3
ELECTRODE REM PT RTRN 9FT ADLT (ELECTROSURGICAL) ×2 IMPLANT
GLOVE SURG ENC MOIS LTX SZ7.5 (GLOVE) ×3 IMPLANT
GOWN STRL REUS W/ TWL LRG LVL3 (GOWN DISPOSABLE) ×4 IMPLANT
GOWN STRL REUS W/ TWL XL LVL3 (GOWN DISPOSABLE) ×2 IMPLANT
GOWN STRL REUS W/TWL LRG LVL3 (GOWN DISPOSABLE) ×6
GOWN STRL REUS W/TWL XL LVL3 (GOWN DISPOSABLE) ×3
GUIDEWIRE ENROUTE 0.014 (WIRE) ×3 IMPLANT
HEMOSTAT SNOW SURGICEL 2X4 (HEMOSTASIS) IMPLANT
INSERT FOGARTY SM (MISCELLANEOUS) IMPLANT
INTRODUCER KIT GALT 7CM (INTRODUCER) ×3
KIT BASIN OR (CUSTOM PROCEDURE TRAY) ×3 IMPLANT
KIT ENCORE 26 ADVANTAGE (KITS) ×3 IMPLANT
KIT INTRODUCER GALT 7 (INTRODUCER) ×2 IMPLANT
KIT TURNOVER KIT B (KITS) ×3 IMPLANT
NDL HYPO 25GX1X1/2 BEV (NEEDLE) IMPLANT
NEEDLE HYPO 25GX1X1/2 BEV (NEEDLE) IMPLANT
PACK CAROTID (CUSTOM PROCEDURE TRAY) ×3 IMPLANT
POSITIONER HEAD DONUT 9IN (MISCELLANEOUS) ×3 IMPLANT
POWDER SURGICEL 3.0 GRAM (HEMOSTASIS) ×1 IMPLANT
PROTECTION STATION PRESSURIZED (MISCELLANEOUS)
SET MICROPUNCTURE 5F STIFF (MISCELLANEOUS) ×1 IMPLANT
SHEATH AVANTI 11CM 5FR (SHEATH) IMPLANT
SHUNT CAROTID BYPASS 10 (VASCULAR PRODUCTS) IMPLANT
SHUNT CAROTID BYPASS 12FRX15.5 (VASCULAR PRODUCTS) IMPLANT
STATION PROTECTION PRESSURIZED (MISCELLANEOUS) IMPLANT
STENT TRANSCAROTID SYSTEM 9X40 (Permanent Stent) ×1 IMPLANT
SUT MNCRL AB 4-0 PS2 18 (SUTURE) ×3 IMPLANT
SUT PROLENE 5 0 C 1 24 (SUTURE) ×5 IMPLANT
SUT PROLENE 6 0 BV (SUTURE) ×1 IMPLANT
SUT PROLENE 7 0 BV 1 (SUTURE) IMPLANT
SUT SILK 2 0 PERMA HAND 18 BK (SUTURE) ×3 IMPLANT
SUT SILK 2 0 SH (SUTURE) ×1 IMPLANT
SUT SILK 2 0 SH CR/8 (SUTURE) ×3 IMPLANT
SUT SILK 3 0 (SUTURE)
SUT SILK 3-0 18XBRD TIE 12 (SUTURE) IMPLANT
SUT VIC AB 3-0 SH 27 (SUTURE) ×3
SUT VIC AB 3-0 SH 27X BRD (SUTURE) ×2 IMPLANT
SYR 10ML LL (SYRINGE) ×9 IMPLANT
SYR 20ML LL LF (SYRINGE) ×3 IMPLANT
SYR CONTROL 10ML LL (SYRINGE) IMPLANT
SYSTEM TRANSCAROTID NEUROPRTCT (MISCELLANEOUS) ×2 IMPLANT
TOWEL GREEN STERILE (TOWEL DISPOSABLE) ×3 IMPLANT
TRANSCAROTID NEUROPROTECT SYS (MISCELLANEOUS) ×3
TUBING ART PRESS 48 MALE/FEM (TUBING) IMPLANT
TUBING EXTENTION W/L.L. (IV SETS) IMPLANT
WATER STERILE IRR 1000ML POUR (IV SOLUTION) ×3 IMPLANT
WIRE BENTSON .035X145CM (WIRE) ×3 IMPLANT

## 2021-06-08 NOTE — Op Note (Signed)
Patient name: Denise Leon MRN: [id number] DOB: [birth year] Sex: female  06/08/2021 Pre-operative Diagnosis: symptomatic Left ICA stenosis Post-operative diagnosis:  Same Surgeon:  Apolinar Junes C. Randie Heinz, MD Assistant: Nathanial Rancher, PA Procedure Performed: 1.  Transcarotid placement of 9 x 40 mm Enroute left ICA stent 2.  Ultrasound-guided cannulation of right common femoral vein   Indications: 75 year old female with symptomatic left ICA lesion.  This does not appear to have high-grade stenosis but is a soft plaque and appears to have been symptomatic on 2 separate occasions.  She is now indicated for transcarotid artery stenting.  An assistant was necessary to facilitate exposure and expedite the case.  Findings: There were 2 areas that appeared to be indentations of the internal carotid artery.  There was no high-grade stenosis.  These 2 areas were covered with stent after ballooning.  The stent was patent in 2 views.  Patient was neurologically intact upon awakening from anesthesia.   Procedure:  The patient was identified in the holding area and taken to the operating room where she is placed upon operating room general anesthesia induced.  She was gently prepped draped in the neck and chest in the usual fashion as well as the bilateral groins.  Antibiotics were administered timeout was called.  Ultrasound was used to identify the common carotid artery and a vertical incision was made just overlying this between the 2 heads of the sternocleidomastoid.  Simultaneously the right common femoral vein was identified was noted to be patent and compressible.  This was cannulated micropuncture needle followed by wire sheath.  Bentson wires placed followed by 8 [place] sheath.  This was flushed with heparinized saline to fix the skin with silk suture.  We then dissected down through the 2 heads of sternocleidomastoid and the neck and identified the IJ and retracted this laterally.  The common carotid  artery was identified patient was fully heparinized with 7000 units of heparin she ultimately required an additional 3000.  We placed a vessel loop umbilical tape around the common carotid artery.  We then placed a 5-0 Prolene U stitch.  The common carotid artery was then cannulated micropuncture needle followed by wire to 3 cm in sheath 3 cm.  Angiography was performed.  J-wire was placed to the level of the carotid bifurcation and flow reversal sheath was placed and affixed to the skin in 2 locations.  Flow reversal was attached and confirmed at high flow.  TCAR timeout was performed.  Additional angiographic views were obtained.  The common carotid artery was clamped.  We placed an 014 wire using a Berenstein catheter into the ICA up into the petrous portion.  We primarily ballooned dilated with a 5 x 30 mm balloon and primarily stented with 9 x 40 mm stent.  After 2 minutes of flow reversal completion angiography in 2 views demonstrated patency with no residual stenosis.  The wire was removed the clamp was removed with a total time of 7 minutes.  Flow reversal was disconnected and blood was returned to the groin.  We remove the sheath and cinched our 5-0 Prolene suture.  We did have to place an additional 6-0 Prolene suture for hemostasis.  Doppler demonstrated good flow.  50 mg of protamine was administered.  We obtain pneumostasis in the neck we pulled the sheath in the right groin pressure was held during stasis obtained.  We irrigated the neck wound and closed platysma with Vicryl and the skin with Monocryl.  Dermabond placed to level  the skin.  She was awakened from anesthesia having tolerated procedure without any complication.  She is noted to be neurologically intact and transferred to the recovery room.  All counts were correct to completion.   EBL: 50cc  [name] C. Randie Heinz, MD [facility] Office: [phone] Pager: [phone]

## 2021-06-08 NOTE — Anesthesia Procedure Notes (Signed)
Procedure Name: Intubation Date/Time: 06/08/2021 12:32 PM Performed by: Claris Che, CRNA Pre-anesthesia Checklist: Patient identified, Emergency Drugs available, Suction available, Patient being monitored and Timeout performed Patient Re-evaluated:Patient Re-evaluated prior to induction Oxygen Delivery Method: Circle system utilized Preoxygenation: Pre-oxygenation with 100% oxygen Induction Type: IV induction and Cricoid Pressure applied Ventilation: Mask ventilation without difficulty Laryngoscope Size: Mac and 4 Grade View: Grade I Tube type: Oral Tube size: 7.5 mm Number of attempts: 1 Airway Equipment and Method: Stylet Placement Confirmation: ETT inserted through vocal cords under direct vision, positive ETCO2 and breath sounds checked- equal and bilateral Secured at: 23 cm Tube secured with: Tape Dental Injury: Teeth and Oropharynx as per pre-operative assessment

## 2021-06-08 NOTE — Anesthesia Preprocedure Evaluation (Signed)
Anesthesia Evaluation  Patient identified by MRN, date of birth, ID band Patient awake    Reviewed: Allergy & Precautions, NPO status , Patient's Chart, lab work & pertinent test results  History of Anesthesia Complications Negative for: history of anesthetic complications  Airway Mallampati: II  TM Distance: >3 FB Neck ROM: Full    Dental  (+) Edentulous Upper, Edentulous Lower   Pulmonary neg shortness of breath, neg sleep apnea, neg COPD, neg recent URI,  Covid-19 Nucleic Acid Test Results Lab Results      Component                Value               Date                      SARSCOV2NAA              NEGATIVE            06/03/2021                SARSCOV2NAA              NEGATIVE            11/14/2019                SARSCOV2                 NEGATIVE            11/06/2019              breath sounds clear to auscultation       Cardiovascular hypertension, Pt. on medications and Pt. on home beta blockers (-) angina(-) Past MI and (-) CHF (-) dysrhythmias  Rhythm:Regular  1. Left ventricular ejection fraction, by estimation, is 55 to 60%. The  left ventricle has normal function. The left ventricle has no regional  wall motion abnormalities. There is mild left ventricular hypertrophy.  Left ventricular diastolic parameters  are consistent with Grade I diastolic dysfunction (impaired relaxation).  2. Right ventricular systolic function is normal. The right ventricular  size is normal. There is normal pulmonary artery systolic pressure. The  estimated right ventricular systolic pressure is 20.3 mmHg.  3. The mitral valve is normal in structure. No evidence of mitral valve  regurgitation. No evidence of mitral stenosis.  4. The aortic valve is tricuspid. Aortic valve regurgitation is not  visualized. No aortic stenosis is present.  5. Aortic dilatation noted. There is mild dilatation of the ascending  aorta, measuring 37 mm.   6. The inferior vena cava is normal in size with greater than 50%  respiratory variability, suggesting right atrial pressure of 3 mmHg.    Neuro/Psych CVA, No Residual Symptoms    GI/Hepatic negative GI ROS, Neg liver ROS,   Endo/Other  negative endocrine ROS  Renal/GU Renal diseaseLab Results      Component                Value               Date                      CREATININE               0.97                06/08/2021  Lab Results      Component                Value               Date                      K                        3.5                 06/08/2021                Musculoskeletal negative musculoskeletal ROS (+)   Abdominal   Peds  Hematology negative hematology ROS (+) Lab Results      Component                Value               Date                      WBC                      8.4                 06/08/2021                HGB                      12.1                06/08/2021                HCT                      35.4 (L)            06/08/2021                MCV                      90.1                06/08/2021                PLT                      264                 06/08/2021              Anesthesia Other Findings   Reproductive/Obstetrics                             Anesthesia Physical Anesthesia Plan  ASA: 3  Anesthesia Plan: General   Post-op Pain Management:    Induction: Intravenous  PONV Risk Score and Plan: 3 and Ondansetron and Dexamethasone  Airway Management Planned: Oral ETT  Additional Equipment: Arterial line  Intra-op Plan:   Post-operative Plan: Extubation in OR  Informed Consent: I have reviewed the patients History and Physical, chart, labs and discussed the procedure including the risks, benefits and alternatives for the proposed anesthesia with the patient or authorized representative who has indicated his/her understanding and acceptance.     Dental advisory  given  Plan Discussed with:  CRNA and Anesthesiologist  Anesthesia Plan Comments:         Anesthesia Quick Evaluation

## 2021-06-08 NOTE — Progress Notes (Signed)
Dr. Randie Heinz at bedside to assess L neck site, some blood manually expressed and gauze pressure dressing applied.  MD not acutely concerned at this time as area is soft and pt is stable.  RN to call MD if noted dyspnea, hypotension or any other major change in pt status.  As per Dr. Randie Heinz, pt can attempt p.o. when ready.

## 2021-06-08 NOTE — Interval H&P Note (Signed)
History and Physical Interval Note:  06/08/2021 11:26 AM  Denise Leon  has presented today for surgery, with the diagnosis of carotid stenosis.  The various methods of treatment have been discussed with the patient and family. After consideration of risks, benefits and other options for treatment, the patient has consented to  Procedure(s): TRANSCAROTID ARTERY REVASCULARIZATION (Left) as a surgical intervention.  The patient's history has been reviewed, patient examined, no change in status, stable for surgery.  I have reviewed the patient's chart and labs.  Questions were answered to the patient's satisfaction.     Lemar Livings

## 2021-06-08 NOTE — Progress Notes (Signed)
Informed Dr. Maple Hudson that patient's blood pressure is 192/97.  He stated he will come to bedside.

## 2021-06-08 NOTE — Progress Notes (Signed)
    Patient evaluated in her room for left neck hematoma.  She is noted to be neurologically intact with some weakness of her right upper extremity which was present preoperatively.  Left neck hematoma somewhat enlarged from previous markings.  I attempted to express some but appears to be within all of the tissues.  She did have significant bruising from all tissues during the operation consistent with dual antiplatelet therapy.  She is not having any respiratory distress or issues with swallowing and she is hemodynamically stable.  She is okay for diet we will continue to monitor the hematoma as long as remains stable she should not require evacuation.  Lemar Livings, MD

## 2021-06-08 NOTE — Progress Notes (Signed)
[facility] Progress Note  Patient: Denise Leon    WUX:[id number]  DOA: 06/03/2021     Date of Service: the patient was seen and examined on 06/08/2021  Brief hospital course: Past medical history of HTN, CKD 3B, cryptogenic stroke, chronic lumbago.  Presents with complaints of right-sided weakness found to have a left acute stroke in the setting of PVD, left ICA stenosis. Underwent left ICA stenting. Currently plan is monitor for postop recovery.  Subjective: Most of the day in the OR and PACU.  Postop had some hematoma on the left neck.  No other acute events overnight.  Assessment and Plan: 1.  Acute left parietal occipital infarct secondary to left ICA high risk plaque. Prior history of CVA requiring tPA on the left frontal area. CT head Chronic ischemic microangiopathy. CTA showed R ICA Plaque without significant stenosis. MRI shows acute infarct in the left parietal occipital region. LDL 131, now 1 Lipitor 80 mg. Loop recorder reportedly showed no A. fib. Echocardiogram showed EF of 55 to 60% no cardiac source of embolization.  PT OT recommends CIR. Currently on 325 mg aspirin and 75 mg Plavix. Hemoglobin A1c 5.8.  2.  Carotid atherosclerosis. Patient with recurrent stroke on the left side along with left ICA stenosis. 8/25 underwent transcatheter placement of left ICA stent. No significant high-grade stenosis. Postprocedure developed left neck hematoma, currently asymptomatic.  Monitor.  3.  HTN Blood pressure stable. Currently on hydralazine and labetalol. Monitor.  4.  HLD LDL 131. Continue Lipitor 80 mg.  5.  AKI Baseline appears to be 0.9. On presentation serum creatinine 1.27 with elevated BUN. Improved with IV hydration. Monitor postoperatively.  6.  Undiagnosed dementia Patient appears to be forgetful. Potentially has undiagnosed dementia from vascular insult. Monitor for now.  Scheduled Meds:  aspirin  300 mg Rectal Daily   Or   aspirin  325  mg Oral Daily   atorvastatin  80 mg Oral Daily   clopidogrel  75 mg Oral Daily   [START ON 06/09/2021] docusate sodium  100 mg Oral Daily   DULoxetine  30 mg Oral Daily   hydrALAZINE       labetalol       latanoprost  1 drop Both Eyes QHS   pantoprazole  40 mg Oral Daily   vitamin B-12  1,000 mcg Oral Daily   Continuous Infusions:  sodium chloride 75 mL/hr at 06/08/21 0507   sodium chloride     cefUROXime (ZINACEF)  IV     magnesium sulfate bolus IVPB     PRN Meds: sodium chloride, acetaminophen **OR** acetaminophen (TYLENOL) oral liquid 160 mg/5 mL **OR** acetaminophen, alum & mag hydroxide-simeth, guaiFENesin-dextromethorphan, hydrALAZINE, HYDROmorphone (DILAUDID) injection, LORazepam, magnesium sulfate bolus IVPB, metoprolol tartrate, ondansetron, oxyCODONE-acetaminophen, phenol, potassium chloride, senna-docusate  Body mass index is 23.27 kg/m.        DVT Prophylaxis:   Place and maintain sequential compression device Start: 06/08/21 1749 SCD's Start: 06/08/21 1604    Advance goals of care discussion: Pt is Full code.  Family Communication: no family was present at bedside, at the time of interview.   Data Reviewed: I have personally reviewed and interpreted daily labs, tele strips, imaging. Creatinine 0.97, electrolytes stable.  Leukocytosis resolved.  Hemoglobin stable.  Physical Exam:  General: Appear in mild distress, no Rash; conjunctiva normal, neck hematoma present on left. Cardiovascular: S1 and S2 Present, no Murmur, Respiratory: good respiratory effort, Bilateral Air entry present and CTA, no Crackles, no wheezes Abdomen: Bowel Sound present,  Soft and no tenderness Extremities: no Pedal edema Neurology: alert and oriented to place and person affect flat in affect. no new focal deficit Gait not checked due to patient safety concerns  Vitals:   06/08/21 1530 06/08/21 1536 06/08/21 1551 06/08/21 1600  BP: 140/67  132/76 140/61  Pulse: 81 85 82 81  Resp:  14  17 19   Temp:   97.7 F (36.5 C)   TempSrc:   Oral   SpO2: 100% 99% 100% 98%  Weight:   61.5 kg   Height:   5\' 4"  (1.626 m)     Disposition:  Status is: Inpatient  Remains inpatient appropriate because:Ongoing diagnostic testing needed not appropriate for outpatient work up  Dispo: The patient is from: Home              Anticipated d/c is to: CIR              Patient currently is not medically stable to d/c.   Difficult to place patient No  Time spent: 35 minutes. I reviewed all nursing notes, pharmacy notes, vitals, pertinent old records. I have discussed plan of care as described above with RN.  Author: , MD [facility] 06/08/2021 5:49 PM  To reach On-call, see care teams to locate the attending and reach out via [email or web address]. Between 7PM-7AM, please contact night-coverage If you still have difficulty reaching the attending provider, please page the [facility] (Director on Call) for [facility] on amion for assistance.

## 2021-06-08 NOTE — Anesthesia Procedure Notes (Signed)
Arterial Line Insertion Start/End8/25/2022 11:10 AM Performed by: Audie Pinto, CRNA  Patient location: Pre-op. Lidocaine 1% used for infiltration Left, radial was placed Catheter size: 20 G Hand hygiene performed  and maximum sterile barriers used  Allen's test indicative of satisfactory collateral circulation Attempts: 1 Procedure performed without using ultrasound guided technique. Following insertion, dressing applied and Biopatch. Post procedure assessment: unchanged  Patient tolerated the procedure well with no immediate complications.

## 2021-06-08 NOTE — Progress Notes (Signed)
Physical Therapy Treatment Patient Details Name: Denise Leon MRN: [id number] DOB: 1945-11-08 Today's Date: 06/08/2021    History of Present Illness This 75 y.o. female admitted with increased Rt sided weakness on 06/03/21. 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  Pt with endarterectomy scheduled for 8/25.   PMH includes CVA with Rt residual weakness, asthma, CKD, HTN, palpitations    PT Comments    Patient progressing towards physical therapy goals. Patient requires minA for ambulation with RW due to balance and managing RW during turns. Patient following commands appropriately with increased time and repetition in [place]. Continue to recommend comprehensive inpatient rehab (CIR) for post-acute therapy needs. Left in bed with transport present to take patient down for procedure.     Follow Up Recommendations  CIR     Equipment Recommendations  None recommended by PT    Recommendations for Other Services       Precautions / Restrictions Precautions Precautions: Fall Restrictions Weight Bearing Restrictions: No    Mobility  Bed Mobility Overal bed mobility: Needs Assistance Bed Mobility: Supine to Sit;Sit to Supine     Supine to sit: Supervision Sit to supine: Supervision   General bed mobility comments: supervision for safety    Transfers Overall transfer level: Needs assistance Equipment used: Rolling Zula Hovsepian (2 wheeled) Transfers: Sit to/from Stand Sit to Stand: Min assist         General transfer comment: minA to rise and steady. Unsteady upon standing  Ambulation/Gait Ambulation/Gait assistance: Min assist Gait Distance (Feet): 50 Feet Assistive device: Rolling Marykatherine Sherwood (2 wheeled) Gait Pattern/deviations: Step-through pattern;Decreased stride length;Decreased weight shift to right;Decreased stance time - right;Trunk flexed Gait velocity: decreased   General Gait Details: decreased stance time on R  with patient complaining of R buttock pain. MinA for balance and RW management primarily during turns   Social research officer, government Rankin (Stroke Patients Only) Modified Rankin (Stroke Patients Only) Pre-Morbid Rankin Score: Moderate disability Modified Rankin: Moderately severe disability     Balance Overall balance assessment: Needs assistance Sitting-balance support: Feet supported Sitting balance-Leahy Scale: Good     Standing balance support: Bilateral upper extremity supported Standing balance-Leahy Scale: Poor Standing balance comment: Requirng UE support                            Cognition Arousal/Alertness: Awake/alert Behavior During Therapy: Flat affect Overall Cognitive Status: No family/caregiver present to determine baseline cognitive functioning                                 General Comments: requires multimodal cueing. Patient declined use of stratus interpreter. Able to understand and respond appropriately to basic commands in [place].      Exercises      General Comments        Pertinent Vitals/Pain Pain Assessment: Faces Faces Pain Scale: Hurts a little bit Pain Location: R hand Pain Descriptors / Indicators: Grimacing;Discomfort;Guarding Pain Intervention(s): Monitored during session;Repositioned    Home Living                      Prior Function            PT Goals (current goals can now be found in the care plan section)  Acute Rehab PT Goals Patient Stated Goal: did not state PT Goal Formulation: Patient unable to participate in goal setting Time For Goal Achievement: 06/18/21 Potential to Achieve Goals: Fair Progress towards PT goals: Progressing toward goals    Frequency    Min 4X/week      PT Plan Current plan remains appropriate    Co-evaluation              AM-PAC PT "6 Clicks" Mobility   Outcome Measure  Help needed turning from your back to  your side while in a flat bed without using bedrails?: A Little Help needed moving from lying on your back to sitting on the side of a flat bed without using bedrails?: A Little Help needed moving to and from a bed to a chair (including a wheelchair)?: A Little Help needed standing up from a chair using your arms (e.g., wheelchair or bedside chair)?: A Little Help needed to walk in hospital room?: A Little Help needed climbing 3-5 steps with a railing? 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 Dan Humphreys PT, DPT Acute Rehabilitation Services Pager [phone] Office [phone]    Viviann Spare 06/08/2021, 10:08 AM

## 2021-06-08 NOTE — Transfer of Care (Signed)
Immediate Anesthesia Transfer of Care Note  Patient: Denise Leon  Procedure(s) Performed: LEFT TRANSCAROTID ARTERY REVASCULARIZATION (Left: Neck) ULTRASOUND GUIDANCE FOR VASCULAR ACCESS, RIGHT FEMORAL VEIN (Right: Groin)  Patient Location: PACU  Anesthesia Type:General  Level of Consciousness: awake, drowsy, patient cooperative and responds to stimulation  Airway & Oxygen Therapy: Patient Spontanous Breathing and Patient connected to nasal cannula oxygen  Post-op Assessment: Report given to RN, Post -op Vital signs reviewed and stable, Patient moving all extremities X 4 and Patient able to stick tongue midline  Post vital signs: Reviewed and stable  Last Vitals:  Vitals Value Taken Time  BP 163/80 06/08/21 1430  Temp    Pulse 81 06/08/21 1436  Resp 15 06/08/21 1436  SpO2 100 % 06/08/21 1436  Vitals shown include unvalidated device data.  Last Pain:  Vitals:   06/08/21 1025  TempSrc:   PainSc: 0-No pain      Patients Stated Pain Goal: 0 (06/05/21 0742)  Complications: No notable events documented.

## 2021-06-08 NOTE — Progress Notes (Signed)
Patient throw up a small amount of greenish emesis. ZOFRAN given.

## 2021-06-08 NOTE — Progress Notes (Signed)
Dr. Lenell Antu on 4E to round on pt around 1730.  L carotid site assessed.  Shadowing noted on dressing by MD. As long as pt is medically stable, we are to continue with dressing changes prn throughout the night.  Dr. Randie Heinz placed the pressure dressing around 1630, I just changed again.  Moderate serosanguenous drainage noted.

## 2021-06-08 NOTE — Progress Notes (Signed)
Area to neck outlined

## 2021-06-08 NOTE — Progress Notes (Signed)
Pt received in 4E27 from PACU RN.  VSS.  L neck site notably swollen, but soft.  PACU RN states it had gotten a bit larger since last seen by surgeon, however vascular advised to go ahead and transport to floor and they would be by to check on her after their current case.  Pt doesn't appear to be in any distress, no complaints of pain or otherwise.  Stroke assessment and education completed.  Unsure of pt's full understanding due to underlying dementia/confusion.  Will continue with frequent assessments and education reinforcement.

## 2021-06-08 NOTE — Progress Notes (Signed)
Pt has been complaining of of 10/10 around the incision site. 1mg  dilaudid given. Pt keep complaining of not being able to breath while stating 98% on room air. For Comfort I put her on 2 l O2. Reposition her with head elevated at 27 degree. The neck dressing is saturated, dressing changed. We'll continue to monitor.

## 2021-06-08 NOTE — Care Management (Deleted)
PT is 74yo Female. Observed calm demeanor and willing to cooperate with the medical staff.  PT was assessed and given pre-surgery preparation.  NEURO/GCS: responded to name and DOB. Alert and oriented 3, noticed minor memory problems and impaired listening/understanding. Pupils were round, equal and reacted to light and accommodation.  HEENT: No hearing aids. PT wears glasses and dentures (both superior and inferior).  Cardiac: heart rate auscultated at 5 cardiac points. No unexpected sounds noticed.  Lungs: auscultate anterior lungs sounds and did not hear any unexpected sounds. PT denied any tobacco use. PT has history of asthma.  Cardiovascular: bilateral radial pulses, equal and +1 amplitude.  Muscoloskeletal: PT right side is weakened by stroke. Noticeable decrease in grip strength on the right hand consistent with history of acute ischemic stroke L/frontal infarction. Difficulty raising the right leg.  Cardiovascular: no edema. Hematoma noticed on previous IV site on dorsal part of left hand. Hematoma noticed on patient lower left abdominal quadrant. When asked about it, PT responded "it's ok, it's not new". Capillary refill on upper and lower extremities in expected parameters <3 secs.  Mobility: PT utilizes a walker and stated that "it is hard to walk" and that "I have pain in my buttocks".  Patient on NPO. Pain level reported at 0.  Patient needed assistance to urinate at 0746, 0820 and before being escorted to the ER at 0955.  Patient was washed with anti-bacterial wipes and dressed in a clean gown. Escorted the patient to the OR at 1005 to prepare for Transcarotid Artery Revascularization.

## 2021-06-09 ENCOUNTER — Encounter (HOSPITAL_COMMUNITY): Payer: Self-pay | Admitting: Vascular Surgery

## 2021-06-09 DIAGNOSIS — Z9862 Peripheral vascular angioplasty status: Secondary | ICD-10-CM

## 2021-06-09 DIAGNOSIS — L7632 Postprocedural hematoma of skin and subcutaneous tissue following other procedure: Secondary | ICD-10-CM

## 2021-06-09 LAB — BASIC METABOLIC PANEL
Anion gap: 10 (ref 5–15)
BUN: 22 mg/dL (ref 8–23)
CO2: 20 mmol/L — ABNORMAL LOW (ref 22–32)
Calcium: 8.2 mg/dL — ABNORMAL LOW (ref 8.9–10.3)
Chloride: 107 mmol/L (ref 98–111)
Creatinine, Ser: 1.22 mg/dL — ABNORMAL HIGH (ref 0.44–1.00)
GFR, Estimated: 47 mL/min — ABNORMAL LOW (ref >60)
Glucose, Bld: 135 mg/dL — ABNORMAL HIGH (ref 70–99)
Potassium: 4.8 mmol/L (ref 3.5–5.1)
Sodium: 137 mmol/L (ref 135–145)

## 2021-06-09 LAB — LIPID PANEL
Cholesterol: 143 mg/dL (ref 0–200)
HDL: 47 mg/dL (ref >40)
LDL Cholesterol: 84 mg/dL (ref 0–99)
Total CHOL/HDL Ratio: 3 RATIO
Triglycerides: 62 mg/dL (ref <150)
VLDL: 12 mg/dL (ref 0–40)

## 2021-06-09 LAB — HEPATIC FUNCTION PANEL
ALT: 24 U/L (ref 0–44)
AST: 38 U/L (ref 15–41)
Albumin: 2.9 g/dL — ABNORMAL LOW (ref 3.5–5.0)
Alkaline Phosphatase: 43 U/L (ref 38–126)
Bilirubin, Direct: 0.2 mg/dL (ref 0.0–0.2)
Indirect Bilirubin: 0.7 mg/dL (ref 0.3–0.9)
Total Bilirubin: 0.9 mg/dL (ref 0.3–1.2)
Total Protein: 5.6 g/dL — ABNORMAL LOW (ref 6.5–8.1)

## 2021-06-09 LAB — CBC
HCT: 35.3 % — ABNORMAL LOW (ref 36.0–46.0)
Hemoglobin: 12 g/dL (ref 12.0–15.0)
MCH: 31.3 pg (ref 26.0–34.0)
MCHC: 34 g/dL (ref 30.0–36.0)
MCV: 91.9 fL (ref 80.0–100.0)
Platelets: 247 10*3/uL (ref 150–400)
RBC: 3.84 MIL/uL — ABNORMAL LOW (ref 3.87–5.11)
RDW: 12.8 % (ref 11.5–15.5)
WBC: 14.8 10*3/uL — ABNORMAL HIGH (ref 4.0–10.5)
nRBC: 0 % (ref 0.0–0.2)

## 2021-06-09 MED ORDER — DOCUSATE SODIUM 100 MG PO CAPS
100.0000 mg | ORAL_CAPSULE | Freq: Two times a day (BID) | ORAL | Status: DC
  Administered 2021-06-09 – 2021-06-10 (×3): 100 mg via ORAL
  Filled 2021-06-09 (×2): qty 1

## 2021-06-09 MED ORDER — ACETAMINOPHEN 325 MG PO TABS
650.0000 mg | ORAL_TABLET | Freq: Three times a day (TID) | ORAL | Status: DC
  Administered 2021-06-09 – 2021-06-10 (×4): 650 mg via ORAL
  Filled 2021-06-09 (×5): qty 2

## 2021-06-09 MED ORDER — OXYCODONE HCL 5 MG PO TABS: 5.0000 mg | ORAL_TABLET | ORAL | Status: DC | PRN

## 2021-06-09 NOTE — Progress Notes (Signed)
Left neck incision appears more swollen and firmer, spreading medially, different from morning assessment.  PA Eveland paged and charge RN made aware.  BP stable.

## 2021-06-09 NOTE — Progress Notes (Addendum)
Inpatient Rehabilitation Admissions Coordinator   I met at bedside with RN and PA. We will observe over next 24 to 48 hrs before assisting with plans for possible Cir admit . I will contact her daughter this afternoon to further discuss.  Denise Baxter, RN, MSN Rehab Admissions Coordinator [phone] 06/09/2021 12:22 PM  I spoke with pt's daughter, Denise Leon by phone. I discussed pt's current progress with therapy 8/25 prior to surgery. We await postoperative therapy assessment to determine if patient is at her functional baseline or if she would benefit from a CIR admit prior to return home with her daughter. I will have Denise Leon, admissions coordinator, to follow up this weekend with patient, daughter and acute team to assist with assessing her rehab needs. Patient was independent with a RW at baseline.  Denise Baxter, RN, MSN Rehab Admissions Coordinator [phone] 06/09/2021 2:26 PM

## 2021-06-09 NOTE — Care Management Important Message (Signed)
Important Message  Patient Details  Name: Denise Leon MRN: [id number] Date of Birth: 22-Aug-1946   Medicare Important Message Given:  Yes     Renie Ora 06/09/2021, 10:24 AM

## 2021-06-09 NOTE — Progress Notes (Signed)
PT Cancellation Note  Patient Details Name: Denise Leon MRN: [id number] DOB: 03/18/1946   Cancelled Treatment:    Reason Eval/Treat Not Completed: (P) Medical issues which prohibited therapy (pt awaiting reassessment from MD due to issues at surgical site.) Will continue efforts later in day per PT POC as schedule permits.   [name] 06/09/2021, 12:14 PM

## 2021-06-09 NOTE — Progress Notes (Signed)
[facility] Progress Note  Patient: Denise Leon    JHE:[id number]  DOA: 06/03/2021     Date of Service: the patient was seen and examined on 06/09/2021  Brief hospital course: Past medical history of HTN, CKD 3B, cryptogenic stroke, chronic lumbago.  Presents with complaints of right-sided weakness found to have a left acute stroke in the setting of PVD, left ICA stenosis. Underwent left ICA stenting. Currently plan is monitor for postop recovery.  Subjective: still has pain in neck and swelling. No other complains   Assessment and Plan: 1.  Acute left parietal occipital infarct secondary to left ICA high risk plaque. Prior history of CVA requiring tPA on the left frontal area. CT head Chronic ischemic microangiopathy. CTA showed R ICA Plaque without significant stenosis. MRI shows acute infarct in the left parietal occipital region. LDL 131, now 1 Lipitor 80 mg. Loop recorder reportedly showed no A. fib. Echocardiogram showed EF of 55 to 60% no cardiac source of embolization.  PT OT recommends CIR. Currently on 325 mg aspirin and 75 mg Plavix. Hemoglobin A1c 5.8.  2.  Carotid atherosclerosis. Patient with recurrent stroke on the left side along with left ICA stenosis. 8/25 underwent transcatheter placement of left ICA stent. No significant high-grade stenosis. Postprocedure developed left neck hematoma, currently asymptomatic. Monitor.  3.  HTN Blood pressure stable. Currently on hydralazine and labetalol. Monitor.  4.  HLD LDL 131. Continue Lipitor 80 mg.  5.  AKI Baseline appears to be 0.9. On presentation serum creatinine 1.27 with elevated BUN. Improved with IV hydration. Monitor postoperatively.  6.  Undiagnosed dementia Patient appears to be forgetful. Potentially has undiagnosed dementia from vascular insult. Monitor for now.  Scheduled Meds:  acetaminophen  650 mg Oral TID AC & HS   aspirin  300 mg Rectal Daily   Or   aspirin  325 mg Oral Daily    atorvastatin  80 mg Oral Daily   clopidogrel  75 mg Oral Daily   docusate sodium  100 mg Oral BID   DULoxetine  30 mg Oral Daily   latanoprost  1 drop Both Eyes QHS   pantoprazole  40 mg Oral Daily   vitamin B-12  1,000 mcg Oral Daily   Continuous Infusions:  sodium chloride 75 mL/hr at 06/09/21 0741   PRN Meds: acetaminophen **OR** acetaminophen (TYLENOL) oral liquid 160 mg/5 mL **OR** acetaminophen, alum & mag hydroxide-simeth, guaiFENesin-dextromethorphan, hydrALAZINE, HYDROmorphone (DILAUDID) injection, ondansetron, oxyCODONE, phenol, senna-docusate  Body mass index is 23.27 kg/m.        DVT Prophylaxis:   Place and maintain sequential compression device Start: 06/08/21 1749 SCD's Start: 06/08/21 1604    Advance goals of care discussion: Pt is Full code.  Family Communication: no family was present at bedside, at the time of interview.   Data Reviewed: I have personally reviewed and interpreted daily labs, tele strips, imaging. Creatinine 1.22, WBC 14.8, HB stable.   Physical Exam:  General: Appear in mild distress, no Rash; Oral Mucosa Clear, moist. Mild swelling of the neck,  Conjunctiva normal  Cardiovascular: S1 and S2 Present, no Murmur, Respiratory: good respiratory effort, Bilateral Air entry present and CTA, no Crackles, no wheezes Abdomen: Bowel Sound present, Soft and no tenderness Extremities: no Pedal edema Neurology: alert and oriented to time, place, and person affect appropriate. no new focal deficit Gait not checked due to patient safety concerns   Vitals:   06/09/21 1105 06/09/21 1200 06/09/21 1700 06/09/21 1939  BP: 131/62 (!) 117/59 Marland Kitchen)  127/105 (!) 143/81  Pulse: 84 90  88  Resp: (!) 23 19  16   Temp:  98.3 F (36.8 C)  98.6 F (37 C)  TempSrc:  Oral  Oral  SpO2: 98% 96%  98%  Weight:      Height:        Disposition:  Status is: Inpatient  Remains inpatient appropriate because:Ongoing diagnostic testing needed not appropriate for  outpatient work up  Dispo: The patient is from: Home              Anticipated d/c is to: CIR              Patient currently is not medically stable to d/c.   Difficult to place patient No  Time spent: 35 minutes. I reviewed all nursing notes, pharmacy notes, vitals, pertinent old records. I have discussed plan of care as described above with RN.  Author: , MD [facility] 06/09/2021 8:21 PM  To reach On-call, see care teams to locate the attending and reach out via [email or web address]. Between 7PM-7AM, please contact night-coverage If you still have difficulty reaching the attending provider, please page the [facility] (Director on Call) for [facility] on amion for assistance.

## 2021-06-09 NOTE — H&P (Signed)
Physical Medicine and Rehabilitation Admission H&P    Chief Complaint  Patient presents with   Weakness   Code Stroke  : HPI: Denise Leon is a 75 year old non-English-speaking right-handed female.  Her first language is TAGALOG.History of hearing loss, asthma, CKD stage III, hypertension as well as patchy infarct posterior left frontal region status post tPA/loop recorder with right side residual weakness maintained on full-strength aspirin and received CIR 11/16/2019 to 12/04/2019 and discharged to home ambulating with a rolling walker close supervision.  Per chart review patient lives with her daughter and son-in-law two-level home bed and bath main level.  Daughter works with a Adult nursephysical therapist at [employer].  Family works split shifts and can provide assistance as needed.  Presented 06/04/2021 with right side numbness and increasing weakness.  CT of the head showed chronic ischemic microangiopathy and generalized atrophy without acute abnormality.  CT angiogram head and neck showed aortic atherosclerosis and left ICA mixed plaque but no significant stenosis.  There was a 2 cm left thyroid nodule recommend thyroid ultrasound as outpatient.  MRI identified patchy acute ischemic infarct involving the left parieto-occipital region associated petechial hemorrhage without frank intraparenchymal hematoma.  No significant mass-effect.  Vascular surgery consulted in regards to carotid atherosclerosis due to the fact patient was symptomatic underwent transcarotid placement and stenting 06/08/2021 per Dr. Randie Heinzain.  Patient is currently maintained on aspirin 325 mg daily as well as Plavix 75 mg daily for CVA prophylaxis.  Tolerating a regular consistency diet.  Therapy evaluations completed due to patient decreased functional mobility right side weakness was admitted for a comprehensive rehab program.   Pt reports L neck hurts s/p carotid stenting- denies pain, but does c/o constipation- doesn't remember  when had LBM.  Is voiding.   Review of Systems  Constitutional:  Negative for chills and fever.  HENT:  Positive for hearing loss.   Eyes:  Negative for blurred vision and double vision.  Respiratory:  Negative for cough and shortness of breath.   Cardiovascular:  Negative for chest pain, palpitations and leg swelling.  Gastrointestinal:  Positive for constipation. Negative for nausea.  Genitourinary:  Negative for dysuria, flank pain and hematuria.  Musculoskeletal:  Positive for myalgias.  Skin:  Negative for rash.  Neurological:  Positive for weakness.  All other systems reviewed and are negative. 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 She has never used smokeless tobacco. She reports that she does not drink alcohol and does not use drugs. Allergies: No Known Allergies Medications Prior to Admission  Medication Sig Dispense Refill   amLODipine (NORVASC) 5 MG tablet TAKE 1 TABLET(5 MG) BY MOUTH DAILY (Patient taking differently: Take 5 mg by mouth daily.) 90 tablet 1   aspirin EC 325 MG EC tablet Take 1 tablet (325 mg total) by mouth daily. 30 tablet 0   atorvastatin (LIPITOR) 80 MG tablet Take 1  tablet (80 mg total) by mouth daily. 30 tablet 0   carvedilol (COREG) 6.25 MG tablet Take 1 tablet (6.25 mg total) by mouth 2 (two) times daily. 180 tablet 3   DULoxetine (CYMBALTA) 30 MG capsule Take 1 capsule (30 mg total) by mouth daily. 30 capsule 3   latanoprost  (XALATAN) 0.005 % ophthalmic solution Place 1 drop into both eyes at bedtime. 2.5 mL 12   Vitamin A 2400 MCG (8000 UT) CAPS Take 2,400 mcg by mouth daily.     vitamin C (ASCORBIC ACID) 500 MG tablet Take 500 mg by mouth daily.      Drug Regimen Review Drug regimen was reviewed and remains appropriate with no significant issues identified  Home: Home Living Family/patient expects to be discharged to:: Private residence Living Arrangements:  (lives with daughter and her family) Available Help at Discharge: Family, Available 24 hours/day (daughter works days and son in law works nights so has 24/7 supervision) Type of Home: House Home Access: Level entry Home Layout: Two level, Able to live on main level with bedroom/bathroom Bathroom Shower/Tub: Health visitor: Standard Additional Comments: info gleaned from previous admission.  Per chart review and pt input.  She lives with her daughter and reports her daughter works as a PT at [employer] With: Daughter   Functional History: Prior Function Level of Independence: Independent with assistive device(s) Comments: pt indicates she was able to complete ADLs Mod I.  Chart review indicates pt was able to ambulate 100 ft with RW, and required supervision for ADLs, including set up when she was discharged from [facility] 11/2019  Functional Status:  Mobility: Bed Mobility Overal bed mobility: Needs Assistance Bed Mobility: Supine to Sit, Sit to Supine Supine to sit: Supervision Sit to supine: Supervision General bed mobility comments: supervision for safety Transfers Overall transfer level: Needs assistance Equipment used: Rolling walker (2 wheeled) Transfers: Sit to/from Stand Sit to Stand: Min assist Stand pivot transfers: Min assist General transfer comment: minA to rise and steady. Unsteady upon standing Ambulation/Gait Ambulation/Gait assistance: Min assist Gait Distance (Feet): 50 Feet Assistive device:  Rolling walker (2 wheeled) Gait Pattern/deviations: Step-through pattern, Decreased stride length, Decreased weight shift to right, Decreased stance time - right, Trunk flexed General Gait Details: decreased stance time on R with patient complaining of R buttock pain. MinA for balance and RW management primarily during turns Gait velocity: decreased Gait velocity interpretation: <1.31 ft/sec, indicative of household ambulator    ADL: ADL Overall ADL's : Needs assistance/impaired Eating/Feeding: Minimal assistance, Bed level Grooming: Wash/dry hands, Wash/dry face, Oral care, Brushing hair, Minimal assistance, Standing Grooming Details (indicate cue type and reason): completed while standing at sink level Upper Body Bathing: Moderate assistance, Sitting Lower Body Bathing: Moderate assistance, Sit to/from stand Upper Body Dressing : Moderate assistance, Sitting Lower Body Dressing: Moderate assistance, Sit to/from stand Lower Body Dressing Details (indicate cue type and reason): requires mod A to don socks EOB Toilet Transfer: Minimal assistance, RW Toilet Transfer Details (indicate cue type and reason): pt voided, provided BSC over toilet, pt with increased steadiness and control Toileting- Clothing Manipulation and Hygiene: Minimal assistance, Sit to/from stand Toileting - Clothing Manipulation Details (indicate cue type and reason): for assistance with mesh briefs Functional mobility during ADLs: Minimal assistance, Rolling walker General ADL Comments: cues for safe use of RW, stability, cues for sequencing.  Cognition: Cognition Overall Cognitive Status: No family/caregiver present to determine baseline cognitive functioning Arousal/Alertness: Awake/alert Orientation Level: Oriented to person,  Disoriented to time, Disoriented to situation Attention: Sustained Sustained Attention: Appears intact Immediate Memory Recall: Sock, Blue, Bed Awareness: Appears intact Problem Solving:  Appears intact Safety/Judgment: Appears intact Cognition Arousal/Alertness: Awake/alert Behavior During Therapy: Flat affect Overall Cognitive Status: No family/caregiver present to determine baseline cognitive functioning General Comments: requires multimodal cueing. Patient declined use of stratus interpreter. Able to understand and respond appropriately to basic commands in [place].  Physical Exam: Blood pressure 127/68, pulse 69, temperature 98.2 F (36.8 C), temperature source Oral, resp. rate 13, height 5\' 4"  (1.626 m), weight 61.5 kg, SpO2 100 %. Physical Exam Vitals and nursing note reviewed.  Constitutional:      Appearance: She is normal weight.     Comments: Elderly female sitting up in chair at bedside; Very HOH and english is 2nd language, NAD  HENT:     Head: Normocephalic and atraumatic.     Comments: Smile equal and tongue midline    Right Ear: External ear normal.     Left Ear: External ear normal.     Nose: Nose normal. No congestion.     Mouth/Throat:     Mouth: Mucous membranes are dry.     Pharynx: Oropharynx is clear. No oropharyngeal exudate.  Eyes:     General:        Left eye: No discharge.     Extraocular Movements: Extraocular movements intact.  Neck:     Comments: L anterior neck incision- large/purple- forming keloid?- closed with glue; appears swollen- but better per Vasc.  Cardiovascular:     Comments: Borderline tachycardia- Regular rhythm, no JVD seen Pulmonary:     Comments: CTA B/L- no W/R/R- good air movement Abdominal:     Comments: Soft, NT, slightly distended vs protuberant; hypoactive BS  Genitourinary:    Comments: - let nurse know- The purewick is actually not in place- on floor-  Musculoskeletal:     Comments: RUE- biceps 4-/5, triceps 4-/5, grip 3+/5, FA 2+/5 LUE- 5-5 in same muscles RLE- HF 4-/5, KE 4-/5, DF and PF 4/5 LLE- 5-/5 in same muscles  Skin:    Comments: L AC fossa IV- some blood in line- R wrist IV ok    Neurological:     Comments: Patient is alert.  Proximal eye contact with examiner.  There is a language barrier and she is very hard of hearing.  She can provide some intelligible yes no responses.  Follows simple demonstrated commands. Intact to light touch in all 4 extremities as well as face  Psychiatric:     Comments: Slightly flat    Results for orders placed or performed during the hospital encounter of 06/03/21 (from the past 48 hour(s))  Basic metabolic panel     Status: Abnormal   Collection Time: 06/08/21  4:33 AM  Result Value Ref Range   Sodium 138 135 - 145 mmol/L   Potassium 3.5 3.5 - 5.1 mmol/L   Chloride 108 98 - 111 mmol/L   CO2 23 22 - 32 mmol/L   Glucose, Bld 103 (H) 70 - 99 mg/dL    Comment: Glucose reference range applies only to samples taken after fasting for at least 8 hours.   BUN 17 8 - 23 mg/dL   Creatinine, Ser 06/10/21 0.44 - 1.00 mg/dL   Calcium 8.5 (L) 8.9 - 10.3 mg/dL   GFR, Estimated 6.50 >35 mL/min    Comment: (NOTE) Calculated using the CKD-EPI Creatinine Equation (2021)    Anion gap 7 5 - 15  Comment: Performed at [facility], [address] [place], [place] [address]  CBC     Status: Abnormal   Collection Time: 06/08/21  4:33 AM  Result Value Ref Range   WBC 8.4 4.0 - 10.5 K/uL   RBC 3.93 3.87 - 5.11 MIL/uL   Hemoglobin 12.1 12.0 - 15.0 g/dL   HCT 03.8 (L) 88.2 - 80.0 %   MCV 90.1 80.0 - 100.0 fL   MCH 30.8 26.0 - 34.0 pg   MCHC 34.2 30.0 - 36.0 g/dL   RDW 34.9 17.9 - 15.0 %   Platelets 264 150 - 400 K/uL   nRBC 0.0 0.0 - 0.2 %    Comment: Performed at [facility], [address] [place], [place] [address]  Surgical pcr screen     Status: None   Collection Time: 06/08/21  7:22 AM   Specimen: Nasal Mucosa; Nasal Swab  Result Value Ref Range   MRSA, PCR NEGATIVE NEGATIVE   Staphylococcus aureus NEGATIVE NEGATIVE    Comment: (NOTE) The Xpert SA Assay (FDA approved for NASAL specimens in patients 60 years of age and  older), is one component of a comprehensive surveillance program. It is not intended to diagnose infection nor to guide or monitor treatment. Performed at [facility], [address], [place], [place] [address]   Type and screen [facility]     Status: None   Collection Time: 06/08/21 11:10 AM  Result Value Ref Range   ABO/RH(D) O POS    Antibody Screen NEG    Sample Expiration      06/11/2021,2359 Performed at [facility], [address] [place], [place] [address]   ABO/Rh     Status: None   Collection Time: 06/08/21 11:16 AM  Result Value Ref Range   ABO/RH(D)      O POS Performed at [facility], [address], [place], [place] [address]   POCT Activated clotting time     Status: None   Collection Time: 06/08/21  1:22 PM  Result Value Ref Range   Activated Clotting Time 306 seconds  Lipid panel     Status: None   Collection Time: 06/09/21  2:28 AM  Result Value Ref Range   Cholesterol 143 0 - 200 mg/dL   Triglycerides 62 <867 mg/dL   HDL 47 >54 mg/dL   Total CHOL/HDL Ratio 3.0 RATIO   VLDL 12 0 - 40 mg/dL   LDL Cholesterol 84 0 - 99 mg/dL    Comment:        Total Cholesterol/HDL:CHD Risk Coronary Heart Disease Risk Table                     Men   Women  1/2 Average Risk   3.4   3.3  Average Risk       5.0   4.4  2 X Average Risk   9.6   7.1  3 X Average Risk  23.4   11.0        Use the calculated Patient Ratio above and the CHD Risk Table to determine the patient's CHD Risk.        ATP III CLASSIFICATION (LDL):  <100     mg/dL   Optimal  492-010  mg/dL   Near or Above                    Optimal  130-159  mg/dL   Borderline  161-096  mg/dL   High  >045     mg/dL   Very High Performed at [facility], [address], [place], [place] [address]   Basic metabolic panel     Status: Abnormal   Collection Time: 06/09/21  2:40 AM  Result Value Ref Range   Sodium 137 135 - 145 mmol/L   Potassium 4.8 3.5 - 5.1 mmol/L    Chloride 107 98 - 111 mmol/L   CO2 20 (L) 22 - 32 mmol/L   Glucose, Bld 135 (H) 70 - 99 mg/dL    Comment: Glucose reference range applies only to samples taken after fasting for at least 8 hours.   BUN 22 8 - 23 mg/dL   Creatinine, Ser 1.91 (H) 0.44 - 1.00 mg/dL    Comment: DELTA CHECK NOTED   Calcium 8.2 (L) 8.9 - 10.3 mg/dL   GFR, Estimated 47 (L) >60 mL/min    Comment: (NOTE) Calculated using the CKD-EPI Creatinine Equation (2021)    Anion gap 10 5 - 15    Comment: Performed at [facility], [address], [place], [place] [address]  CBC     Status: Abnormal   Collection Time: 06/09/21  2:40 AM  Result Value Ref Range   WBC 14.8 (H) 4.0 - 10.5 K/uL   RBC 3.84 (L) 3.87 - 5.11 MIL/uL   Hemoglobin 12.0 12.0 - 15.0 g/dL   HCT 56.2 (L) 13.0 - 86.5 %   MCV 91.9 80.0 - 100.0 fL   MCH 31.3 26.0 - 34.0 pg   MCHC 34.0 30.0 - 36.0 g/dL   RDW 78.4 69.6 - 29.5 %   Platelets 247 150 - 400 K/uL   nRBC 0.0 0.0 - 0.2 %    Comment: Performed at [facility], [address] [place], [place] [address]  Hepatic function panel     Status: Abnormal   Collection Time: 06/09/21  2:40 AM  Result Value Ref Range   Total Protein 5.6 (L) 6.5 - 8.1 g/dL   Albumin 2.9 (L) 3.5 - 5.0 g/dL   AST 38 15 - 41 U/L   ALT 24 0 - 44 U/L   Alkaline Phosphatase 43 38 - 126 U/L   Total Bilirubin 0.9 0.3 - 1.2 mg/dL   Bilirubin, Direct 0.2 0.0 - 0.2 mg/dL   Indirect Bilirubin 0.7 0.3 - 0.9 mg/dL    Comment: Performed at [facility], [address], [place], [place] [address]   Structural Heart Procedure  Result Date: 06/08/2021 See surgical note for result.  HYBRID OR IMAGING ([facility] ONLY)  Result Date: 06/08/2021 There is no interpretation for this exam.  This order is for images obtained during a surgical procedure.  Please See "Surgeries" Tab for more information regarding the procedure.       Medical Problem List and Plan: 1.  Increasing right side weakness secondary to left  parietal occipital 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  Status post transcarotid placement/stenting 06/08/2021  -patient may  shower as long as covers incision  -ELOS/Goals: 10-14 days- min A 2.  Antithrombotics: -DVT/anticoagulation:  Mechanical: Sequential compression devices, below knee Bilateral lower extremities  -antiplatelet therapy: aspirin 325 mg daily and Plavix 75 mg daily 3. Pain Management: Oxycodone as needed 4. Mood: Cymbalta 30 mg daily  -antipsychotic agents: N/A 5. Neuropsych: This patient is capable of  making decisions on her own behalf. 6. Skin/Wound Care: Routine skin checks 7. Fluids/Electrolytes/Nutrition: Routine in and outs with follow-up chemistries 8.  Hyperlipidemia.  Lipitor 9.  CKD stage III.Creatinine baseline 1.10-1.22 10.  Incidental 2 cm left thyroid nodule.  Follow-up outpatient 11.Permissive Hypertension.Norvasc  daily and Coreg 6.25.mg daily PTA and resume as needed.- were taking prior to admission- will start if needed (was needed, so restarted both for BP of 180s/90s and HR of low 100s). Will also start hydralazine 25 mg q8 hours prn for SBP >180 and DBP >100.  12. Urinary frequency- will check U/A and Cx (was (-)- ). Will monitor 13. Constipation- will give Sorbitol  60cc x1 and see if can get pt cleaned out.     Mcarthur Rossetti Angiulli, PA-C 06/09/2021   I have personally performed a face to face diagnostic evaluation of this patient and formulated the key components of the plan.  Additionally, I have personally reviewed laboratory data, imaging studies, as well as relevant notes and concur with the physician assistant's documentation above.   The patient's status has not changed from the original H&P.  Any changes in documentation from the acute care chart have been noted above.

## 2021-06-09 NOTE — Progress Notes (Signed)
Physical Therapy Treatment Patient Details Name: Denise Leon MRN: [id number] DOB: 13-May-1946 Today's Date: 06/09/2021    History of Present Illness This 75 y.o. female admitted with increased Rt sided weakness on 06/03/21. 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 S/P TCAR on 8/25. PMH includes CVA with Rt residual weakness, asthma, CKD, HTN, palpitations    PT Comments    Pt received in supine, agreeable to therapy session and with good participation and tolerance for gait and transfer training. Pt with noted short term memory deficit and frequently asking questions about RLE weakness, L neck "feeling warm" and stroke, RN notified as this therapist unsure of pt cognitive baseline prior to TCAR. Pt continues to c/o facial numbness around jaw area bilaterally. Pt continues to benefit from PT services to progress toward functional mobility goals.    Follow Up Recommendations  CIR     Equipment Recommendations  None recommended by PT    Recommendations for Other Services       Precautions / Restrictions Precautions Precautions: Fall Precaution Comments: HoH, speaks [place] and some English Restrictions Weight Bearing Restrictions: No    Mobility  Bed Mobility Overal bed mobility: Needs Assistance Bed Mobility: Supine to Sit;Sit to Supine     Supine to sit: Min assist Sit to supine: Supervision   General bed mobility comments: cues for self-assist, use of bed rails, HOB elevated    Transfers Overall transfer level: Needs assistance Equipment used: Rolling walker (2 wheeled) Transfers: Sit to/from Stand Sit to Stand: Min assist         General transfer comment: minA to rise and steady. Unsteady upon standing  Ambulation/Gait Ambulation/Gait assistance: Min assist Gait Distance (Feet): 18 Feet (x2 with seated break) Assistive device: Rolling walker (2 wheeled) Gait Pattern/deviations: Step-through  pattern;Decreased stride length;Decreased weight shift to right;Decreased stance time - right;Trunk flexed Gait velocity: decreased   General Gait Details: decreased stance time on R and short step length bilaterally. MinA for balance and RW management and needs manual assist at times to advance RLE when stepping. Up to modA when turning and just prior to sitting due to posterior lean   Stairs             Wheelchair Mobility    Modified Rankin (Stroke Patients Only) Modified Rankin (Stroke Patients Only) Pre-Morbid Rankin Score: Moderate disability Modified Rankin: Moderately severe disability     Balance Overall balance assessment: Needs assistance Sitting-balance support: Feet supported Sitting balance-Leahy Scale: Good     Standing balance support: Bilateral upper extremity supported Standing balance-Leahy Scale: Poor Standing balance comment: Requiring UE and external support                            Cognition Arousal/Alertness: Awake/alert Behavior During Therapy: Flat affect Overall Cognitive Status: No family/caregiver present to determine baseline cognitive functioning                                 General Comments: requires multimodal cueing. Patient having difficulty hearing Stratus Interpreter. Able to understand and respond appropriately to basic commands in [place]. Pt with noted short term memory deficit and asking "what happened to me?" about every 2-5 minutes (RN notified I'm not sure what her memory baseline was prior to TCAR). Pt also c/o facial numbness on both sides and still has some R  facial droop.      Exercises General Exercises - Lower Extremity Ankle Circles/Pumps: AROM;Both;10 reps;Seated Long Arc Quad: AROM;Right;10 reps;Seated Hip Flexion/Marching: AROM;AAROM;Right;10 reps;Seated;Standing Other Exercises Other Exercises: Partial bridging x 5  in supine, assist to maintain R LE positioning (limiting ER)     General Comments General comments (skin integrity, edema, etc.): BP 143/80 supine ~1hr prior to session; BP 162/76 seated; BP 127/105 supine post-mobility (RN notified)      Pertinent Vitals/Pain Pain Assessment: Faces Faces Pain Scale: Hurts a little bit Pain Location: "feeling warm" at surgery site in neck but denies pain Pain Descriptors / Indicators: Grimacing;Discomfort;Guarding Pain Intervention(s): Monitored during session;Repositioned    Home Living                      Prior Function            PT Goals (current goals can now be found in the care plan section) Acute Rehab PT Goals Patient Stated Goal: did not state PT Goal Formulation: Patient unable to participate in goal setting Time For Goal Achievement: 06/18/21 Potential to Achieve Goals: Fair    Frequency    Min 4X/week      PT Plan Current plan remains appropriate                  AM-PAC PT "6 Clicks" Mobility   Outcome Measure  Help needed turning from your back to your side while in a flat bed without using bedrails?: A Little Help needed moving from lying on your back to sitting on the side of a flat bed without using bedrails?: A Little Help needed moving to and from a bed to a chair (including a wheelchair)?: A Little Help needed standing up from a chair using your arms (e.g., wheelchair or bedside chair)?: A Little Help needed to walk in hospital room?: A Lot (min to modA) Help needed climbing 3-5 steps with a railing? 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

## 2021-06-09 NOTE — Progress Notes (Signed)
STROKE TEAM PROGRESS NOTE   INTERVAL HISTORY No family at bedside.  Patient elective left TCAR procedure yesterday.  She has some hematoma and discharge at the surgical site in the neck.  No neurological changes.  Vital signs stable.  Vitals:   06/09/21 0342 06/09/21 0735 06/09/21 1105 06/09/21 1200  BP: 115/69 127/68 131/62 (!) 117/59  Pulse: 80 69 84 90  Resp: 15 13 (!) 23 19  Temp: 98.1 F (36.7 C) 98.2 F (36.8 C)  98.3 F (36.8 C)  TempSrc: Oral Oral  Oral  SpO2: 100% 100% 98% 96%  Weight:      Height:       CBC:  Recent Labs  Lab 06/03/21 2200 06/04/21 1141 06/07/21 0236 06/08/21 0433 06/09/21 0240  WBC 11.3* 13.4*   < > 8.4 14.8*  NEUTROABS 6.7 9.5*  --   --   --   HGB 12.2 13.3   < > 12.1 12.0  HCT 35.9* 38.9   < > 35.4* 35.3*  MCV 90.2 89.8   < > 90.1 91.9  PLT 287 308   < > 264 247   < > = values in this interval not displayed.   Basic Metabolic Panel:  Recent Labs  Lab 06/08/21 0433 06/09/21 0240  NA 138 137  K 3.5 4.8  CL 108 107  CO2 23 20*  GLUCOSE 103* 135*  BUN 17 22  CREATININE 0.97 1.22*  CALCIUM 8.5* 8.2*   Lipid Panel:  Recent Labs  Lab 06/09/21 0228  CHOL 143  TRIG 62  HDL 47  CHOLHDL 3.0  VLDL 12  LDLCALC 84   HgbA1c:  Recent Labs  Lab 06/04/21 1141  HGBA1C 5.8*   Urine Drug Screen:  Recent Labs  Lab 06/04/21 0127  LABOPIA NONE DETECTED  COCAINSCRNUR NONE DETECTED  LABBENZ NONE DETECTED  AMPHETMU NONE DETECTED  THCU NONE DETECTED  LABBARB NONE DETECTED    Alcohol Level  Recent Labs  Lab 06/03/21 2200  ETH <10    IMAGING past 24 hours Structural Heart Procedure  Result Date: 06/08/2021 See surgical note for result.   PHYSICAL EXAM  Temp:  [97.7 F (36.5 C)-98.6 F (37 C)] 98.3 F (36.8 C) (08/26 1200) Pulse Rate:  [69-103] 90 (08/26 1200) Resp:  [11-23] 19 (08/26 1200) BP: (109-163)/(59-99) 117/59 (08/26 1200) SpO2:  [96 %-100 %] 96 % (08/26 1200) Arterial Line BP: (125-188)/(66-106) 149/78 (08/26  0000) Weight:  [61.5 kg] 61.5 kg (08/25 1551)  General - Well nourished, well developed pleasant elderly lady, in no apparent distress.  Ophthalmologic - fundi not visualized due to noncooperation.  Cardiovascular - Regular rhythm and rate.  Neuro -  awake, alert, eyes open, hard of hearing, oriented to age, place, but not to time. No aphasia, but paucity of speech given language barrier, following all simple commands. Able to name 3/5 and repeat simple sentences. No gaze palsy, tracking bilaterally, visual field full, PERRL. Right mild facial droop. Tongue midline.  LUE 4/5 and RUE proximal 3+/5 and distal 3+/5. Bilaterally LEs 4/5 proximal and distal, no drift. Sensation symmetrical bilaterally, right FTN mild ataxia in proportion to the weakness, gait not tested.   ASSESSMENT/PLAN [name] is a 75 y.o. female with history of Left patchy frontal CVA with tPA, CKD III, HTN, HLD, sensorineural hearing loss, back pain, palpitations s/p Loop recorder, asthma, Left MCA stroke, and thyroid nodule presenting with increased right upper extremity weakness and facial numbness.   Stroke:  Acute Left  parietal-occipital infarct secondary to left ICA high risk plaque s/p left TCAR procedure 06/08/2021 CT head Chronic ischemic microangiopathy and generalized atrophy without acute intracranial abnormality. ASPECTS 10.    CTA head & neck aortic atherosclerosis and left ICA mixed plaques but no significant stenosis MRI Patchy acute ischemic infarcts involving the left parieto-occipital region as above. Associated petechial hemorrhage without frank intraparenchymal hematoma. age-related cerebral atrophy with moderate to advanced chronic microvascular ischemic disease. 2D Echo - EF 55 - 60%. No cardiac source of emboli identified.  Loop recorder no A fib LDL 131 HgbA1c 5.8 VTE prophylaxis - Lovenox/SCD'd aspirin 325 mg daily prior to admission, now on aspirin 325 mg daily and clopidogrel 75 mg daily.   Duration per vascular surgery Therapy recommendations:  CIR  Disposition:  pending   History of stroke In 10/2019 patient admitted for right-sided weakness and right facial droop.  CT no acute abnormality.  Status post tPA.  MRI showed left frontal patchy small infarcts.   EF 60 to 65%, DVT negative.  Loop recorder placed.  LDL 156, A1c 5.7.  She was discharged with DAPT and Lipitor 80.  Carotid atherosclerosis 10/2019 CTA head and neck aortic atherosclerosis and left ICA mixed plaques but no significant stenosis.  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  Aspirin and Plavix for stroke prevention given recent carotid stent and aggressive risk factor modification.  Ongoing therapy consults and transfer to inpatient level.  Follow-up as an outpatient stroke clinic in 6 to 8 weeks.  Greater than 50% time during this 25-minute visit was spent in counseling and coordination discussion with care team questions.  Discussed with Dr. [id number].  Stroke team will sign off.  Kindly call for questions.   Stroke Neurology 06/09/2021 1:40 PM    To contact Stroke Continuity provider, please refer to 06/11/2021. After hours, contact General Neurology

## 2021-06-09 NOTE — Progress Notes (Signed)
   I was called to evaluate the patient after finishing in the OR.  The patient's left neck incision appears the same as it did by my exam this morning.  I do not feel a firm hematoma.  She was able to swallow pills and breakfast without difficulty.  She is currently breathing without distress.  I do not see a benefit to going back to the OR for exploration.  We will continue to monitor as an inpatient.  She can likely be discharged to inpatient rehabilitation tomorrow if pain is better controlled and there is no change with left neck incision.  Emilie Rutter, PA-C [facility] [phone] 06/09/2021  12:33 PM

## 2021-06-09 NOTE — Progress Notes (Addendum)
  Progress Note    06/09/2021 7:33 AM 1 Day Post-Op  Subjective:  feeling better this morning   Vitals:   06/09/21 0000 06/09/21 0342  BP: 135/84 115/69  Pulse: 100 80  Resp: 14 15  Temp:  98.1 F (36.7 C)  SpO2: 100% 100%   Physical Exam: Lungs:  non labored Incisions:  L neck incision less edema than yesterday; some serosanguinous drainage on the dressing Extremities:  moving all extremities well Neurologic: R arm weakness  CBC    Component Value Date/Time   WBC 14.8 (H) 06/09/2021 0240   RBC 3.84 (L) 06/09/2021 0240   HGB 12.0 06/09/2021 0240   HCT 35.3 (L) 06/09/2021 0240   PLT 247 06/09/2021 0240   MCV 91.9 06/09/2021 0240   MCH 31.3 06/09/2021 0240   MCHC 34.0 06/09/2021 0240   RDW 12.8 06/09/2021 0240   LYMPHSABS 2.6 06/04/2021 1141   MONOABS 1.1 (H) 06/04/2021 1141   EOSABS 0.1 06/04/2021 1141   BASOSABS 0.0 06/04/2021 1141    BMET    Component Value Date/Time   NA 137 06/09/2021 0240   K 4.8 06/09/2021 0240   CL 107 06/09/2021 0240   CO2 20 (L) 06/09/2021 0240   GLUCOSE 135 (H) 06/09/2021 0240   BUN 22 06/09/2021 0240   CREATININE 1.22 (H) 06/09/2021 0240   CALCIUM 8.2 (L) 06/09/2021 0240   GFRNONAA 47 (L) 06/09/2021 0240   GFRAA 42 (L) 12/02/2019 0549    INR    Component Value Date/Time   INR 0.9 06/03/2021 2200     Intake/Output Summary (Last 24 hours) at 06/09/2021 3903 Last data filed at 06/08/2021 2331 Gross per 24 hour  Intake 2250 ml  Output 100 ml  Net 2150 ml     Assessment/Plan:  75 y.o. female is s/p L TCAR 1 Day Post-Op   -Post operative hematoma however breathing and swallowing ok; seems to have Improved over the last 12 hours -neuro exam remains at baseline -Ok for discharge home this afternoon if pain improved   Emilie Rutter, PA-C [facility] [phone] 06/09/2021 7:33 AM  I have independently interviewed and examined patient and agree with PA assessment and plan above.  Hematoma left  neck much improved.  Okay for discharge from vascular standpoint with aspirin, Plavix and statin.  Gladys Gutman C. Randie Heinz, MD [facility] [place] Office: [phone] Pager: [phone]

## 2021-06-09 NOTE — Progress Notes (Signed)
PHARMACIST LIPID MONITORING   Denise Leon is a 75 y.o. female admitted on 06/03/2021 with stroke. S/p TCAR 06/08/21.  Pharmacy has been consulted to optimize lipid-lowering therapy with the indication of secondary prevention for clinical ASCVD.  Recent Labs:  Lipid Panel (last 6 months):   Lab Results  Component Value Date   CHOL 143 06/09/2021   TRIG 62 06/09/2021   HDL 47 06/09/2021   CHOLHDL 3.0 06/09/2021   VLDL 12 06/09/2021   LDLCALC 84 06/09/2021    Hepatic function panel (last 6 months):   Lab Results  Component Value Date   AST 38 06/09/2021   ALT 24 06/09/2021   ALKPHOS 43 06/09/2021   BILITOT 0.9 06/09/2021   BILIDIR 0.2 06/09/2021   IBILI 0.7 06/09/2021    SCr (since admission):   Serum creatinine: 1.22 mg/dL (H) 40/98/11 9147 Estimated creatinine clearance: 35.5 mL/min (A)  Current therapy and lipid therapy tolerance Current lipid-lowering therapy: Atorvastatin 80 mg daily Previous lipid-lowering therapies (if applicable): same Documented or reported allergies or intolerances to lipid-lowering therapies (if applicable): none  Assessment:    Patient does not recall taking Atorvastatin consistently.  Has had prescriptions for each #30, filled 3/17, 4/28 and 7/6.    LDL 131, goal < 70  Plan:    1.Statin intensity (high intensity recommended for all patients regardless of the LDL):  No statin changes. The patient is already on a high intensity statin.  2. Noted plan to follow with Stroke Clinic in 6-8 weeks.     Could consider adding Zetia at a later date if LDL is not at goal.    Dennie Fetters, RPh 06/09/2021, 5:47 PM

## 2021-06-09 NOTE — Discharge Instructions (Signed)
   [facility]  Discharge Instructions   Carotid Surgery  Please refer to the following instructions for your post-procedure care. Your surgeon or physician assistant will discuss any changes with you.  Activity  You are encouraged to walk as much as you can. You can slowly return to normal activities but must avoid strenuous activity and heavy lifting until your doctor tell you it's okay. Avoid activities such as vacuuming or swinging a golf club. You can drive after one week if you are comfortable and you are no longer taking prescription pain medications. It is normal to feel tired for serval weeks after your surgery. It is also normal to have difficulty with sleep habits, eating, and bowel movements after surgery. These will go away with time.  Bathing/Showering  Shower daily after you go home. Do not soak in a bathtub, hot tub, or swim until the incision heals completely.  Incision Care  Shower every day. Clean your incision with mild soap and water. Pat the area dry with a clean towel. You do not need a bandage unless otherwise instructed. Do not apply any ointments or creams to your incision. You may have skin glue on your incision. Do not peel it off. It will come off on its own in about one week. Your incision may feel thickened and raised for several weeks after your surgery. This is normal and the skin will soften over time.   For Men Only: It's okay to shave around the incision but do not shave the incision itself for 2 weeks. It is common to have numbness under your chin that could last for several months.  Diet  Resume your normal diet. There are no special food restrictions following this procedure. A low fat/low cholesterol diet is recommended for all patients with vascular disease. In order to heal from your surgery, it is CRITICAL to get adequate nutrition. Your body requires vitamins, minerals, and protein. Vegetables are the best source of  vitamins and minerals. Vegetables also provide the perfect balance of protein. Processed food has little nutritional value, so try to avoid this.  Medications  Resume taking all of your medications unless your doctor or physician assistant tells you not to. If your incision is causing pain, you may take over-the- counter pain relievers such as acetaminophen (Tylenol). If you were prescribed a stronger pain medication, please be aware these medications can cause nausea and constipation. Prevent nausea by taking the medication with a snack or meal. Avoid constipation by drinking plenty of fluids and eating foods with a high amount of fiber, such as fruits, vegetables, and grains.   Do not take Tylenol if you are taking prescription pain medications.  Follow Up  Our office will schedule a follow up appointment 2-3 weeks following discharge.  Please call us immediately for any of the following conditions  . Increased pain, redness, drainage (pus) from your incision site. . Fever of 101 degrees or higher. . If you should develop stroke (slurred speech, difficulty swallowing, weakness on one side of your body, loss of vision) you should call 911 and go to the nearest emergency room. .  Reduce your risk of vascular disease:  . Stop smoking. If you would like help call [facility] at 1-800-QUIT-NOW (1-800-784-8669) or [facility] at 336-586-4000. . Manage your cholesterol . Maintain a desired weight . Control your diabetes . Keep your blood pressure down .  If you have any questions, please call the office at [phone]. 

## 2021-06-10 ENCOUNTER — Encounter (HOSPITAL_COMMUNITY): Payer: Self-pay | Admitting: Physical Medicine & Rehabilitation

## 2021-06-10 ENCOUNTER — Inpatient Hospital Stay (HOSPITAL_COMMUNITY)
Admission: RE | Admit: 2021-06-10 | Discharge: 2021-06-24 | DRG: 057 | Disposition: A | Discharge status: Home Health | Payer: Medicare Other | Source: Intra-hospital | Attending: Physical Medicine & Rehabilitation | Admitting: Physical Medicine & Rehabilitation

## 2021-06-10 ENCOUNTER — Other Ambulatory Visit: Payer: Self-pay

## 2021-06-10 DIAGNOSIS — N1831 Chronic kidney disease, stage 3a: Secondary | ICD-10-CM | POA: Diagnosis not present

## 2021-06-10 DIAGNOSIS — E785 Hyperlipidemia, unspecified: Secondary | ICD-10-CM | POA: Diagnosis present

## 2021-06-10 DIAGNOSIS — F32A Depression, unspecified: Secondary | ICD-10-CM | POA: Diagnosis present

## 2021-06-10 DIAGNOSIS — I639 Cerebral infarction, unspecified: Secondary | ICD-10-CM | POA: Diagnosis not present

## 2021-06-10 DIAGNOSIS — T82898D Other specified complication of vascular prosthetic devices, implants and grafts, subsequent encounter: Secondary | ICD-10-CM

## 2021-06-10 DIAGNOSIS — E876 Hypokalemia: Secondary | ICD-10-CM | POA: Diagnosis present

## 2021-06-10 DIAGNOSIS — D62 Acute posthemorrhagic anemia: Secondary | ICD-10-CM

## 2021-06-10 DIAGNOSIS — I69320 Aphasia following cerebral infarction: Secondary | ICD-10-CM | POA: Diagnosis not present

## 2021-06-10 DIAGNOSIS — Z95828 Presence of other vascular implants and grafts: Secondary | ICD-10-CM | POA: Diagnosis not present

## 2021-06-10 DIAGNOSIS — E041 Nontoxic single thyroid nodule: Secondary | ICD-10-CM | POA: Diagnosis present

## 2021-06-10 DIAGNOSIS — I129 Hypertensive chronic kidney disease with stage 1 through stage 4 chronic kidney disease, or unspecified chronic kidney disease: Secondary | ICD-10-CM | POA: Diagnosis present

## 2021-06-10 DIAGNOSIS — Z7982 Long term (current) use of aspirin: Secondary | ICD-10-CM

## 2021-06-10 DIAGNOSIS — N390 Urinary tract infection, site not specified: Secondary | ICD-10-CM | POA: Diagnosis present

## 2021-06-10 DIAGNOSIS — Z79899 Other long term (current) drug therapy: Secondary | ICD-10-CM

## 2021-06-10 DIAGNOSIS — R330 Drug induced retention of urine: Secondary | ICD-10-CM | POA: Diagnosis present

## 2021-06-10 DIAGNOSIS — B9620 Unspecified Escherichia coli [E. coli] as the cause of diseases classified elsewhere: Secondary | ICD-10-CM | POA: Diagnosis present

## 2021-06-10 DIAGNOSIS — I10 Essential (primary) hypertension: Secondary | ICD-10-CM | POA: Diagnosis not present

## 2021-06-10 DIAGNOSIS — I63232 Cerebral infarction due to unspecified occlusion or stenosis of left carotid arteries: Secondary | ICD-10-CM | POA: Diagnosis not present

## 2021-06-10 DIAGNOSIS — K5900 Constipation, unspecified: Secondary | ICD-10-CM | POA: Diagnosis present

## 2021-06-10 DIAGNOSIS — I63512 Cerebral infarction due to unspecified occlusion or stenosis of left middle cerebral artery: Secondary | ICD-10-CM

## 2021-06-10 DIAGNOSIS — T443X5A Adverse effect of other parasympatholytics [anticholinergics and antimuscarinics] and spasmolytics, initial encounter: Secondary | ICD-10-CM | POA: Diagnosis present

## 2021-06-10 DIAGNOSIS — N1832 Chronic kidney disease, stage 3b: Secondary | ICD-10-CM | POA: Diagnosis present

## 2021-06-10 DIAGNOSIS — I63412 Cerebral infarction due to embolism of left middle cerebral artery: Secondary | ICD-10-CM | POA: Diagnosis not present

## 2021-06-10 DIAGNOSIS — N183 Chronic kidney disease, stage 3 unspecified: Secondary | ICD-10-CM | POA: Diagnosis present

## 2021-06-10 DIAGNOSIS — I6300 Cerebral infarction due to thrombosis of unspecified precerebral artery: Secondary | ICD-10-CM | POA: Diagnosis not present

## 2021-06-10 DIAGNOSIS — I69351 Hemiplegia and hemiparesis following cerebral infarction affecting right dominant side: Secondary | ICD-10-CM | POA: Diagnosis present

## 2021-06-10 LAB — BASIC METABOLIC PANEL
Anion gap: 6 (ref 5–15)
BUN: 26 mg/dL — ABNORMAL HIGH (ref 8–23)
CO2: 25 mmol/L (ref 22–32)
Calcium: 8.2 mg/dL — ABNORMAL LOW (ref 8.9–10.3)
Chloride: 108 mmol/L (ref 98–111)
Creatinine, Ser: 1.32 mg/dL — ABNORMAL HIGH (ref 0.44–1.00)
GFR, Estimated: 43 mL/min — ABNORMAL LOW (ref >60)
Glucose, Bld: 109 mg/dL — ABNORMAL HIGH (ref 70–99)
Potassium: 3.8 mmol/L (ref 3.5–5.1)
Sodium: 139 mmol/L (ref 135–145)

## 2021-06-10 LAB — CBC
HCT: 27.5 % — ABNORMAL LOW (ref 36.0–46.0)
Hemoglobin: 9.3 g/dL — ABNORMAL LOW (ref 12.0–15.0)
MCH: 31.3 pg (ref 26.0–34.0)
MCHC: 33.8 g/dL (ref 30.0–36.0)
MCV: 92.6 fL (ref 80.0–100.0)
Platelets: 240 10*3/uL (ref 150–400)
RBC: 2.97 MIL/uL — ABNORMAL LOW (ref 3.87–5.11)
RDW: 13.1 % (ref 11.5–15.5)
WBC: 10.6 10*3/uL — ABNORMAL HIGH (ref 4.0–10.5)
nRBC: 0 % (ref 0.0–0.2)

## 2021-06-10 MED ORDER — DOCUSATE SODIUM 100 MG PO CAPS
100.0000 mg | ORAL_CAPSULE | Freq: Two times a day (BID) | ORAL | Status: DC
  Administered 2021-06-10 – 2021-06-24 (×27): 100 mg via ORAL
  Filled 2021-06-10 (×28): qty 1

## 2021-06-10 MED ORDER — ACETAMINOPHEN 650 MG RE SUPP: 650.0000 mg | RECTAL | Status: DC | PRN

## 2021-06-10 MED ORDER — GUAIFENESIN-DM 100-10 MG/5ML PO SYRP: 15.0000 mL | ORAL_SOLUTION | ORAL | Status: DC | PRN

## 2021-06-10 MED ORDER — LATANOPROST 0.005 % OP SOLN
1.0000 [drp] | Freq: Every day | OPHTHALMIC | Status: DC
  Administered 2021-06-10 – 2021-06-23 (×14): 1 [drp] via OPHTHALMIC
  Filled 2021-06-10: qty 2.5

## 2021-06-10 MED ORDER — DULOXETINE HCL 30 MG PO CPEP
30.0000 mg | ORAL_CAPSULE | Freq: Every day | ORAL | Status: DC
  Administered 2021-06-11 – 2021-06-24 (×14): 30 mg via ORAL
  Filled 2021-06-10 (×14): qty 1

## 2021-06-10 MED ORDER — ATORVASTATIN CALCIUM 80 MG PO TABS
80.0000 mg | ORAL_TABLET | Freq: Every day | ORAL | Status: DC
  Administered 2021-06-11 – 2021-06-24 (×14): 80 mg via ORAL
  Filled 2021-06-10 (×14): qty 1

## 2021-06-10 MED ORDER — ASPIRIN 300 MG RE SUPP
300.0000 mg | Freq: Every day | RECTAL | Status: DC
  Filled 2021-06-10 (×14): qty 1

## 2021-06-10 MED ORDER — OXYCODONE HCL 5 MG PO TABS
5.0000 mg | ORAL_TABLET | ORAL | Status: DC | PRN
  Administered 2021-06-10: 5 mg via ORAL
  Filled 2021-06-10 (×2): qty 1

## 2021-06-10 MED ORDER — ASPIRIN 325 MG PO TABS
325.0000 mg | ORAL_TABLET | Freq: Every day | ORAL | Status: DC
  Administered 2021-06-11 – 2021-06-24 (×14): 325 mg via ORAL
  Filled 2021-06-10 (×14): qty 1

## 2021-06-10 MED ORDER — ACETAMINOPHEN 160 MG/5ML PO SOLN: 650.0000 mg | ORAL | Status: DC | PRN

## 2021-06-10 MED ORDER — CLOPIDOGREL BISULFATE 75 MG PO TABS
75.0000 mg | ORAL_TABLET | Freq: Every day | ORAL | Status: DC
  Administered 2021-06-11 – 2021-06-24 (×14): 75 mg via ORAL
  Filled 2021-06-10 (×14): qty 1

## 2021-06-10 MED ORDER — ALUM & MAG HYDROXIDE-SIMETH 200-200-20 MG/5ML PO SUSP: 15.0000 mL | ORAL | Status: DC | PRN

## 2021-06-10 MED ORDER — SENNOSIDES-DOCUSATE SODIUM 8.6-50 MG PO TABS
1.0000 | ORAL_TABLET | Freq: Every evening | ORAL | Status: DC | PRN
  Filled 2021-06-10: qty 1

## 2021-06-10 MED ORDER — ACETAMINOPHEN 325 MG PO TABS
650.0000 mg | ORAL_TABLET | ORAL | Status: DC | PRN
  Administered 2021-06-10 – 2021-06-17 (×3): 650 mg via ORAL
  Filled 2021-06-10 (×6): qty 2

## 2021-06-10 MED ORDER — VITAMIN B-12 1000 MCG PO TABS
1000.0000 ug | ORAL_TABLET | Freq: Every day | ORAL | Status: DC
  Administered 2021-06-11 – 2021-06-24 (×14): 1000 ug via ORAL
  Filled 2021-06-10 (×14): qty 1

## 2021-06-10 MED ORDER — PHENOL 1.4 % MT LIQD: 1.0000 | OROMUCOSAL | Status: DC | PRN

## 2021-06-10 MED ORDER — PANTOPRAZOLE SODIUM 40 MG PO TBEC
40.0000 mg | DELAYED_RELEASE_TABLET | Freq: Every day | ORAL | Status: DC
  Administered 2021-06-11 – 2021-06-24 (×14): 40 mg via ORAL
  Filled 2021-06-10 (×14): qty 1

## 2021-06-10 NOTE — Progress Notes (Signed)
INPATIENT REHABILITATION ADMISSION NOTE   Arrival Method: wheelchair     Mental Orientation:alert x2-3   Assessment:done   Skin:ecchymosis left neck; right groin   IV'S:NSx75 ongoing to be SL no order   Pain:head left 10/10 ; pain med given   Tubes and Drains:none   Safety Measures:done   Vital Signs:done   Height and Weight:done   Rehab Orientation:confused   Family:not in room    Notes:

## 2021-06-10 NOTE — Progress Notes (Addendum)
  Progress Note    06/10/2021 8:54 AM 2 Days Post-Op  Subjective:  c/o pain at incision  afebrile  Vitals:   06/10/21 0404 06/10/21 0732  BP: 105/77 (!) 168/74  Pulse: 80 77  Resp: 17 18  Temp: 98.6 F (37 C) 98.7 F (37.1 C)  SpO2: 98% 98%    Physical Exam: General:  no distress Lungs:  non labored Incisions:  mild swelling over left neck incision Extremities:  moving all extremities   CBC    Component Value Date/Time   WBC 10.6 (H) 06/10/2021 0246   RBC 2.97 (L) 06/10/2021 0246   HGB 9.3 (L) 06/10/2021 0246   HCT 27.5 (L) 06/10/2021 0246   PLT 240 06/10/2021 0246   MCV 92.6 06/10/2021 0246   MCH 31.3 06/10/2021 0246   MCHC 33.8 06/10/2021 0246   RDW 13.1 06/10/2021 0246   LYMPHSABS 2.6 06/04/2021 1141   MONOABS 1.1 (H) 06/04/2021 1141   EOSABS 0.1 06/04/2021 1141   BASOSABS 0.0 06/04/2021 1141    BMET    Component Value Date/Time   NA 139 06/10/2021 0246   K 3.8 06/10/2021 0246   CL 108 06/10/2021 0246   CO2 25 06/10/2021 0246   GLUCOSE 109 (H) 06/10/2021 0246   BUN 26 (H) 06/10/2021 0246   CREATININE 1.32 (H) 06/10/2021 0246   CALCIUM 8.2 (L) 06/10/2021 0246   GFRNONAA 43 (L) 06/10/2021 0246   GFRAA 42 (L) 12/02/2019 0549    INR    Component Value Date/Time   INR 0.9 06/03/2021 2200     Intake/Output Summary (Last 24 hours) at 06/10/2021 0854 Last data filed at 06/10/2021 0406 Gross per 24 hour  Intake 2524.64 ml  Output 700 ml  Net 1824.64 ml     Assessment/Plan:  75 y.o. female is s/p:  Left TCAR  2 Days Post-Op   -pt left neck incision looks good with minimal swelling and not in any distress.   -moving all extremities -ok for CIR from vascular standpoint.  F/u in 4 weeks with carotid duplex and our office will arrange appt.      Doreatha Massed, PA-C [facility] [phone] 06/10/2021 8:54 AM   I have seen and evaluated the patient. I agree with the PA note as documented above.  Postop day 2 status  post left TCAR.  Concern for hematoma post-op but this looks stable to me this morning if not improved.  She has some weakness on the right side but appears to be at her neurologic baseline given recent CVA.  Continue aspirin Plavix statin from my standpoint.  Cephus Shelling, MD [facility] [place] Office: [phone]

## 2021-06-10 NOTE — Progress Notes (Addendum)
Signed                                                                                                                                                                                                                                                                                                                                                                                                                                                              PMR Admission Coordinator Pre-Admission Assessment   Patient: Denise Leon is an 75 y.o., female MRN: [id number] DOB: 09-26-1946 Height: _0  (162.6 cm) Weight: 61.5 kg   Insurance Information HMO:     PPO:      PCP:      IPA:      80/20:      OTHER:  PRIMARY: Medicare a and b      Policy#: [id number]      Subscriber: pt Benefits:  Phone #: passport one source online     Name: 8/23 Eff. Date: A 11/16/2019 and B 08/16/2019     Deduct: $1556      Out of Pocket Max: none      Life Max: none CIR: 100%      SNF: 20 full days Outpatient: 80%     Co-Pay: 20% Home Health: 100%      Co-Pay:  none DME: 80%     Co-Pay: 20% Providers: pt choice  SECONDARY: Medicaid of [place]      Policy#: [id number] m    8/23 active MAAQN per passport one source online   Financial Counselor:       Phone#:    The "Data Collection Information Summary" for patients in Inpatient Rehabilitation Facilities with attached "[facility] Records" was provided and verbally reviewed with: Family   Emergency Contact Information Contact Information       Name Relation Home Work [place], [place] Daughter     [place], [place] Other     [phone]           Current Medical History  Patient Admitting Diagnosis: CVA   History of Present Illness:  75 year old non-English-speaking right-handed  female.  Her first language is TAGALOG.History of hearing loss, asthma, CKD stage III, hypertension as well as patchy infarct posterior left frontal region status post TPA/loop recorder with right side residual weakness maintained on full-strength aspirin and received CIR 11/16/2019 to 12/04/2019 and discharged to home ambulating with a rolling walker close supervision.  Presented 06/04/2021 with right side numbness and increasing weakness.  CT of the head showed chronic ischemic microangiopathy and generalized atrophy without acute abnormality.  CT angiogram head and neck showed aortic atherosclerosis and left ICA mixed plaque but no significant stenosis.  There was a 2 cm left thyroid nodule recommend thyroid ultrasound as outpatient.  MRI identified patchy acute ischemic infarct involving the left parieto-occipital region associated petechial hemorrhage without frank intraparenchymal hematoma.  No significant mass-effect.  Vascular surgery consulted in regards to carotid atherosclerosis due to the fact patient was symptomatic underwent trans carotid placement and stenting 06/08/2021 per Dr. Donzetta Matters.  Patient is currently maintained on aspirin 325 mg daily as well as Plavix 75 mg daily for CVA prophylaxis.  Tolerating a regular consistency diet.  Therapy evaluations completed due to patient decreased functional mobility right side weakness was admitted    Complete NIHSS TOTAL: 4   Patient's medical record from [facility] has been reviewed by the rehabilitation admission coordinator and physician.   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 No   Family History   family history includes Lung disease in her mother.   Current Medications   Current Facility-Administered Medications:    0.9 %  sodium chloride infusion, , Intravenous, Continuous, Baglia, Corrina, PA-C, Last Rate: 75 mL/hr at 06/10/21 0040, Infusion Verify at 06/10/21 0040   acetaminophen (TYLENOL) tablet 650 mg, 650 mg, Oral, Q4H PRN, 650 mg at 06/09/21 1403 **OR** acetaminophen (TYLENOL) 160 MG/5ML solution 650 mg, 650 mg, Per Tube, Q4H PRN **OR** acetaminophen (TYLENOL) suppository 650 mg, 650 mg, Rectal, Q4H PRN, Baglia, Corrina, PA-C   acetaminophen (TYLENOL) tablet 650 mg, 650 mg, Oral, TID AC & HS, Lavina Hamman, MD, 650 mg at 06/10/21 0858   alum & mag hydroxide-simeth (MAALOX/MYLANTA) 200-200-20 MG/5ML suspension 15-30 mL, 15-30 mL, Oral, Q2H PRN,  Baglia, Corrina, PA-C   aspirin suppository 300 mg, 300 mg, Rectal, Daily **OR** aspirin tablet 325 mg, 325 mg, Oral, Daily, Baglia, Corrina, PA-C, 325 mg at 06/10/21 0858   atorvastatin (LIPITOR) tablet 80 mg, 80 mg, Oral, Daily, Baglia, Corrina, PA-C, 80 mg at 06/10/21 0859   clopidogrel (PLAVIX) tablet 75 mg, 75 mg, Oral, Daily, Baglia, Corrina, PA-C, 75 mg at 06/10/21 0858   docusate sodium (COLACE) capsule 100 mg, 100 mg, Oral, BID, Lavina Hamman, MD, 100 mg at 06/10/21 0859   DULoxetine (CYMBALTA) DR capsule 30 mg, 30 mg, Oral, Daily, Baglia, Corrina, PA-C, 30 mg at 06/10/21 0859   guaiFENesin-dextromethorphan (ROBITUSSIN DM) 100-10 MG/5ML syrup 15 mL, 15 mL, Oral, Q4H PRN, Baglia, Corrina, PA-C   hydrALAZINE (APRESOLINE) injection 10 mg, 10 mg, Intravenous, Q4H PRN, Baglia, Corrina, PA-C   HYDROmorphone (DILAUDID) injection 0.5-1 mg, 0.5-1 mg, Intravenous, Q2H PRN, Baglia, Corrina, PA-C, 1 mg at 06/08/21 1959   latanoprost (XALATAN) 0.005 % ophthalmic solution 1 drop, 1 drop, Both Eyes, QHS, Baglia, Corrina, PA-C, 1 drop at 06/09/21 2031   ondansetron (ZOFRAN)  injection 4 mg, 4 mg, Intravenous, Q6H PRN, Baglia, Corrina, PA-C, 4 mg at 06/08/21 2324   oxyCODONE (Oxy IR/ROXICODONE) immediate release tablet 5 mg, 5 mg, Oral, Q4H PRN, Lavina Hamman, MD   pantoprazole (PROTONIX) EC tablet 40 mg, 40 mg, Oral, Daily, Baglia, Corrina, PA-C, 40 mg at 06/10/21 0858   phenol (CHLORASEPTIC) mouth spray 1 spray, 1 spray, Mouth/Throat, PRN, Baglia, Corrina, PA-C   senna-docusate (Senokot-S) tablet 1 tablet, 1 tablet, Oral, QHS PRN, Baglia, Corrina, PA-C   vitamin B-12 (CYANOCOBALAMIN) tablet 1,000 mcg, 1,000 mcg, Oral, Daily, Baglia, Corrina, PA-C, 1,000 mcg at 06/10/21 0900   Patients Current Diet:  Diet Order                  Diet Heart Room service appropriate? Yes; Fluid consistency: Thin  Diet effective now                       Precautions / Restrictions Precautions Precautions: Fall Precaution Comments: HoH, speaks Tagalog and some English Restrictions Weight Bearing Restrictions: No    Has the patient had 2 or more falls or a fall with injury in the past year? No   Prior Activity Level Limited Community (1-2x/wk): Mod I with RW and adls   Prior Functional Level Self Care: Did the patient need help bathing, dressing, using the toilet or eating? Needed some help   Indoor Mobility: Did the patient need assistance with walking from room to room (with or without device)? Independent   Stairs: Did the patient need assistance with internal or external stairs (with or without device)? Independent   Functional Cognition: Did the patient need help planning regular tasks such as shopping or remembering to take medications? Needed some help   Patient Information Are you of Hispanic, Latino/a,or Spanish origin?: A. No, not of Hispanic, Latino/a, or Spanish origin What is  your race?: F. Filipino Do you need an interpreter to communicate with a doctor or health care staff: 1. Yes   Patient's Response To:  Health Literacy and Transportation Is  the patient able to respond to health literacy and transportation needs?: Yes Health Literacy-How often do you need to have someone help you when you read instructions, pamphlets, other written materials from your doctor or pharmacy?: Often In the past 12 months, has lack of transportation kept you from medical appointments or from  getting medications?: No In the past 12 months, has lack of transportation kept you from meetings, work, or from getting things needed for daily living?: No    Development worker, international aid / [place] Devices/Equipment: None   Prior Device Use: Indicate devices/aids used by the patient prior to current illness, exacerbation or injury? Walker   Current Functional Level Cognition   Arousal/Alertness: Awake/alert Overall Cognitive Status: No family/caregiver present to determine baseline cognitive functioning Orientation Level: Oriented to person, Oriented to situation, Disoriented to place, Disoriented to time General Comments: requires multimodal cueing. Patient having difficulty hearing [employer] Interpreter. Able to understand and respond appropriately to basic commands in [place]. Pt with noted short term memory deficit and asking "what happened to me?" about every 2-5 minutes (RN notified I'm not sure what her memory baseline was prior to [place]). Pt also c/o facial numbness on both sides and still has some R facial droop. Attention: Sustained Sustained Attention: Appears intact Awareness: Appears intact Problem Solving: Appears intact Safety/Judgment: Appears intact    Extremity Assessment (includes Sensation/Coordination)   Upper Extremity Assessment: RUE deficits/detail RUE Deficits / Details: Pt demonstrates ~55* shoulder flexion, mass grasp/release of right hand. pt reports she "has been like this" RUE Coordination: decreased fine motor, decreased gross motor LUE Deficits / Details: Per chart review, pt with h/o frozen shoulder.  Pt demonstrates  ~60* Shoulder flexion.  Elbow distally grossly WFL  Lower Extremity Assessment: Defer to PT evaluation RLE Deficits / Details: AAROM hip/ knee and ankle, hip grossly 2/5, knee 3/5 and ankle3+/5 LLE Deficits / Details: AROM WFL, strength grossly 4/5     ADLs   Overall ADL's : Needs assistance/impaired Eating/Feeding: Minimal assistance, Bed level Grooming: Wash/dry hands, Wash/dry face, Oral care, Brushing hair, Minimal assistance, Standing Grooming Details (indicate cue type and reason): completed while standing at sink level Upper Body Bathing: Moderate assistance, Sitting Lower Body Bathing: Moderate assistance, Sit to/from stand Upper Body Dressing : Moderate assistance, Sitting Lower Body Dressing: Moderate assistance, Sit to/from stand Lower Body Dressing Details (indicate cue type and reason): requires mod A to don socks EOB Toilet Transfer: Minimal assistance, RW Toilet Transfer Details (indicate cue type and reason): pt voided, provided BSC over toilet, pt with increased steadiness and control Toileting- Clothing Manipulation and Hygiene: Minimal assistance, Sit to/from stand Toileting - Clothing Manipulation Details (indicate cue type and reason): for assistance with mesh briefs Functional mobility during ADLs: Minimal assistance, Rolling walker General ADL Comments: cues for safe use of RW, stability, cues for sequencing.     Mobility   Overal bed mobility: Needs Assistance Bed Mobility: Supine to Sit, Sit to Supine Supine to sit: Min assist Sit to supine: Supervision General bed mobility comments: cues for self-assist, use of bed rails, HOB elevated     Transfers   Overall transfer level: Needs assistance Equipment used: Rolling walker (2 wheeled) Transfers: Sit to/from Stand Sit to Stand: Min assist Stand pivot transfers: Min assist General transfer comment: minA to rise and steady. Unsteady upon standing     Ambulation / Gait / Stairs / Wheelchair Mobility    Ambulation/Gait Ambulation/Gait assistance: Herbalist (Feet): 18 Feet (x2 with seated break) Assistive device: Rolling walker (2 wheeled) Gait Pattern/deviations: Step-through pattern, Decreased stride length, Decreased weight shift to right, Decreased stance time - right, Trunk flexed General Gait Details: decreased stance time on R and short step length bilaterally. MinA for balance and RW management and needs manual assist at times to advance RLE  when stepping. Up to [place] when turning and just prior to sitting due to posterior lean Gait velocity: decreased Gait velocity interpretation: <1.31 ft/sec, indicative of household ambulator     Posture / Balance Balance Overall balance assessment: Needs assistance Sitting-balance support: Feet supported Sitting balance-Leahy Scale: Good Standing balance support: Bilateral upper extremity supported Standing balance-Leahy Scale: Poor Standing balance comment: Requiring UE and external support     Special needs/care consideration Tagalog interpreter needed HOH    Previous Home Environment  Living Arrangements: Children  Lives With: Daughter Available Help at Discharge: Family, Available 24 hours/day (daughter works days and son in law works nights so has 24/7 supervision) Type of Home: [employer] Layout: Two level, Able to live on main level with bedroom/bathroom Home Access: Level entry Bathroom Shower/Tub: Multimedia programmer: Standard Home Care Services: No Additional Comments: info gleaned from previous admission.  Per chart review and pt input.  She lives with her daughter and reports her daughter works as a PT at [employer] for Discharge Living Setting: Lives with (comment) (daughter and son in law) Type of Home at Discharge: House Discharge Home Layout: Two level, Able to live on main level with bedroom/bathroom Discharge Home Access: Level entry Discharge Bathroom  Shower/Tub: Walk-in shower Discharge Bathroom Toilet: Standard Discharge Bathroom Accessibility: Yes How Accessible: Accessible via walker Does the patient have any problems obtaining your medications?: No   Social/Family/Support Systems Contact Information: daughter, [name], speaks English Anticipated Caregiver: daughter and son in law Anticipated Caregiver's Contact Information: see above Ability/Limitations of Caregiver: daughter works days Caregiver Availability: 24/7 Discharge Plan Discussed with Primary Caregiver: Yes Is Caregiver In Agreement with Plan?: Yes Does Caregiver/Family have Issues with Lodging/Transportation while Pt is in Rehab?: No   Goals Patient/Family Goal for Rehab: supervision PT, supervision to min OT, supervision SLP Expected length of stay: ELOS 10 to 12 days Cultural Considerations: Tagalog interpreter; does speak ENglish but very [place] Pt/Family Agrees to Admission and willing to participate: Yes Program Orientation Provided & Reviewed with Pt/Caregiver Including Roles  & Responsibilities: Yes   Decrease burden of Care through IP rehab admission: n/a   Possible need for SNF placement upon discharge: not anticipated   Patient Condition: I have reviewed medical records from [facility] , spoken with  patient and daughter. I met with patient at the bedside for inpatient rehabilitation assessment.  Patient will benefit from ongoing PT, OT, and SLP, can actively participate in 3 hours of therapy a day 5 days of the week, and can make measurable gains during the admission.  Patient will also benefit from the coordinated team approach during an Inpatient Acute Rehabilitation admission.  The patient will receive intensive therapy as well as Rehabilitation physician, nursing, social worker, and care management interventions.  Due to bladder management, bowel management, safety, skin/wound care, disease management, medication administration, pain management,  and patient education the patient requires 24 hour a day rehabilitation nursing.  The patient is currently min assist overall with mobility and basic ADLs.  Discharge setting and therapy post discharge at home with home health is anticipated.  Patient has agreed to participate in the Acute Inpatient Rehabilitation Program and will admit today.   Preadmission Screen Completed By: Danne Baxter RN MSN, 06/10/2021 10:21 AM ______________________________________________________________________   Discussed status with Dr. Dagoberto Ligas on 06/10/21  at 10:21 AM and received approval for admission today.   Admission Coordinator: Danne Baxter RN MSN, time 10:21 AM/Date 06/10/21  Assessment/Plan: Diagnosis: Does the need for close, 24 hr/day Medical supervision in concert with the patient's rehab needs make it unreasonable for this patient to be served in a less intensive setting? Yes Co-Morbidities requiring supervision/potential complications: demenita?; R hemi dfue to L pareital-occipital stroke s/p Carotid stending on L; HOH; CKD3, and HTN; previous stroke Due to bladder management, bowel management, safety, skin/wound care, disease management, medication administration, pain management, and patient education, does the patient require 24 hr/day rehab nursing? Yes Does the patient require coordinated care of a physician, rehab nurse, PT, OT, and SLP to address physical and functional deficits in the context of the above medical diagnosis(es)? Yes Addressing deficits in the following areas: balance, endurance, locomotion, strength, transferring, bowel/bladder control, bathing, dressing, feeding, grooming, toileting, cognition, speech, and language Can the patient actively participate in an intensive therapy program of at least 3 hrs of therapy 5 days a week? Yes The potential for patient to make measurable gains while on inpatient rehab is good and fair Anticipated functional outcomes upon discharge from  inpatient rehab: supervision and min assist PT, supervision and min assist OT, supervision and min assist SLP Estimated rehab length of stay to reach the above functional goals is: 10-14 days Anticipated discharge destination: Home 10. Overall Rehab/Functional Prognosis: good and fair     MD Signature:

## 2021-06-10 NOTE — Progress Notes (Signed)
Inpatient Rehab Admissions Coordinator:  There is a bed available for pt to admit to CIR today. Dr. Allena Katz aware and in agreement. Pt, pt's daughter Denise Leon), NSG, and TOC, made aware.   Wolfgang Phoenix, MS, CCC-SLP Admissions Coordinator [phone]

## 2021-06-10 NOTE — Evaluation (Signed)
Occupational Therapy Assessment and Plan  Patient Details  Name: Denise Leon MRN: [id number] Date of Birth: 05-15-1946  OT Diagnosis: abnormal posture, cognitive deficits, hemiplegia affecting dominant side, muscle weakness (generalized), and swelling of limb Rehab Potential: Rehab Potential (ACUTE ONLY): Good ELOS: 10-14 days   Today's Date: 06/11/2021 OT Individual Time: 7673-4193 OT Individual Time Calculation (min): 61 min     Hospital Problem: Principal Problem:   CVA (cerebral vascular accident) ([place]) Active Problems:   Acute ischemic stroke ([place]) patchy L frontal infarcts s/p tPA   CKD (chronic kidney disease), stage IIIa   Occlusion of carotid stent, subsequent encounter   Past Medical History:  Past Medical History:  Diagnosis Date   Acute ischemic stroke ([place]) patchy L frontal infarcts s/p tPA 11/14/2019   Asthma    CKD (chronic kidney disease), stage IIIb 11/16/2019   Hyperlipidemia 11/16/2019   Hypertension    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   Palpitations 11/16/2019   Stroke ([place]) 11/14/2019   Thyroid nodule, L 11/16/2019   Past Surgical History:  Past Surgical History:  Procedure Laterality Date   HIP SURGERY     NECK SURGERY     TRANSCAROTID ARTERY REVASCULARIZATION  Left 06/08/2021   Procedure: LEFT TRANSCAROTID ARTERY REVASCULARIZATION;  Surgeon: Waynetta Sandy, MD;  Location: [facility];  Service: Vascular;  Laterality: Left;   ULTRASOUND GUIDANCE FOR VASCULAR ACCESS Right 06/08/2021   Procedure: ULTRASOUND GUIDANCE FOR VASCULAR ACCESS, RIGHT FEMORAL VEIN;  Surgeon: Waynetta Sandy, MD;  Location: [facility] OR;  Service: Vascular;  Laterality: Right;    Assessment & Plan Clinical Impression: 75 year old non-English-speaking right-handed female.  Her first language is TAGALOG.History of hearing loss, asthma, CKD stage III, hypertension as well as patchy infarct posterior left frontal region status post TPA/loop recorder with right side  residual weakness maintained on full-strength aspirin and received CIR 11/16/2019 to 12/04/2019 and discharged to home ambulating with a rolling walker close supervision.  Presented 06/04/2021 with right side numbness and increasing weakness.  CT of the head showed chronic ischemic microangiopathy and generalized atrophy without acute abnormality.  CT angiogram head and neck showed aortic atherosclerosis and left ICA mixed plaque but no significant stenosis.  There was a 2 cm left thyroid nodule recommend thyroid ultrasound as outpatient.  MRI identified patchy acute ischemic infarct involving the left parieto-occipital region associated petechial hemorrhage without frank intraparenchymal hematoma.  No significant mass-effect.  Vascular surgery consulted in regards to carotid atherosclerosis due to the fact patient was symptomatic underwent trans carotid placement and stenting 06/08/2021 per Dr. Donzetta Matters.  Patient is currently maintained on aspirin 325 mg daily as well as Plavix 75 mg daily for CVA prophylaxis.  Tolerating a regular consistency diet.  Therapy evaluations completed due to patient decreased functional mobility right side weakness was admitted     Patient currently requires min with basic self-care skills secondary to muscle weakness and muscle joint tightness, decreased cardiorespiratoy endurance, abnormal tone, unbalanced muscle activation, decreased coordination, and decreased motor planning, decreased attention to right, decreased initiation, decreased problem solving, and decreased memory, and decreased sitting balance, decreased standing balance, decreased postural control, and hemiplegia.  Prior to hospitalization, patient could complete BADLs with Supervision-Mod I.   Patient will benefit from skilled intervention to increase independence with basic self-care skills prior to discharge  home with family .  Anticipate patient will require 24 hour supervision and follow up home health.  OT - End of  Session Endurance Deficit: Yes OT Assessment Rehab  Potential (ACUTE ONLY): Good OT Barriers to Discharge: Lack of/limited family support OT Patient demonstrates impairments in the following area(s): Balance;Cognition;Edema;Endurance;Motor;Safety;Vision OT Basic ADL's Functional Problem(s): Grooming;Bathing;Dressing;Toileting OT Advanced ADL's Functional Problem(s): Simple Meal Preparation OT Transfers Functional Problem(s): Toilet;Tub/Shower OT Additional Impairment(s): Fuctional Use of Upper Extremity OT Plan OT Intensity: Minimum of 1-2 x/day, 45 to 90 minutes OT Frequency: 5 out of 7 days OT Duration/Estimated Length of Stay: 10-14 days OT Treatment/Interventions: Balance/vestibular training;Disease mangement/prevention;Neuromuscular re-education;Self Care/advanced ADL retraining;Therapeutic Exercise;Cognitive remediation/compensation;Pain management;DME/adaptive equipment instruction;Skin care/wound managment;UE/LE Strength taining/ROM;UE/LE Coordination activities;Patient/family education;Community reintegration;Functional electrical stimulation;Discharge planning;Functional mobility training;Psychosocial support;Therapeutic Activities;Visual/perceptual remediation/compensation OT Self Feeding Anticipated Outcome(s): No goal OT Basic Self-Care Anticipated Outcome(s): Supervision OT Toileting Anticipated Outcome(s): Supervision OT Bathroom Transfers Anticipated Outcome(s): Supervision OT Recommendation Recommendations for Other Services: Other (comment) (n/a) Patient destination: Home Follow Up Recommendations: Home health OT Equipment Recommended: To be determined   OT Evaluation Precautions/Restrictions  Precautions Precautions: Fall Precaution Comments: HoH better hearing in her Rt ear, speaks Tagalog and some English Restrictions Weight Bearing Restrictions: No General   Vital Signs Therapy Vitals Temp: 98.7 F (37.1 C) Temp Source: Oral Pulse Rate: (!) 103 Resp:  19 BP: (!) 157/81 Patient Position (if appropriate): Lying Oxygen Therapy SpO2: 96 % O2 Device: Room Air Pain Pain Assessment Pain Scale: 0-10 Pain Score: 0-No pain Faces Pain Scale: No hurt Multiple Pain Sites: No Home Living/Prior Functioning Home Living Family/patient expects to be discharged to:: Private residence Living Arrangements: Children Available Help at Discharge: Family, Available 24 hours/day Type of Home: House Home Access: Level entry Home Layout: Two level, Able to live on main level with bedroom/bathroom Bathroom Shower/Tub: Multimedia programmer: Standard Additional Comments: Information per chart review and pt input.  She lives with her daughter and reports her daughter works as a PT at [employer] With: Daughter (daughter + son-in-law per chart) IADL History Homemaking Responsibilities: Yes (per pt, she used to cook pork and dust her home PTA) Leisure and Hobbies: "Gathering with friends" Prior Function Level of Independence: Requires assistive device for independence, Independent with homemaking with ambulation, Independent with transfers, Independent with basic ADLs Driving: No (pt reports she did drive PTA but that family drove most often. ?accuracy of information) Vocation: Retired Surveyor, mining Baseline Vision/History: 1 Wears glasses Ability to See in Adequate Light: 1 Impaired Patient Visual Report: Blurring of vision Vision Assessment?: Yes Eye Alignment: Within Functional Limits Alignment/Gaze Preference: Within Defined Limits Tracking/Visual Pursuits: Decreased smoothness of eye movement to LEFT superior field;Decreased smoothness of eye movement to LEFT inferior field Saccades: Decreased speed of saccadic movement Perception  Perception: Impaired Inattention/Neglect: Does not attend to right side of body Praxis Praxis: Impaired Praxis Impairment Details: Motor planning Cognition Overall Cognitive Status: No family/caregiver  present to determine baseline cognitive functioning Arousal/Alertness: Awake/alert Orientation Level: Person;Place;Situation Person: Oriented Place: Oriented Situation: Oriented Year: 2022 Month: August Day of Week: Incorrect Memory: Impaired Memory Impairment: Decreased short term memory Decreased Short Term Memory: Verbal basic Immediate Memory Recall: Sock Memory Recall Sock: Without Cue Memory Recall Blue: Not able to recall Memory Recall Bed: Not able to recall Attention: Sustained Sustained Attention: Appears intact Awareness: Appears intact Problem Solving: Impaired Problem Solving Impairment: Functional basic Executive Function: Sequencing;Organizing;Self Correcting Sequencing: Impaired Sequencing Impairment: Verbal complex;Verbal basic Organizing: Impaired Organizing Impairment: Verbal basic;Verbal complex Self Correcting: Impaired Self Correcting Impairment: Verbal basic;Functional basic Safety/Judgment: Appears intact [employer] Scales of Cognitive Functioning: Confused/appropriate Sensation Sensation Light Touch: Appears Intact Proprioception: Impaired Detail Proprioception  Impaired Details: Absent RUE;Absent RLE Coordination Gross Motor Movements are Fluid and Coordinated: No Fine Motor Movements are Fluid and Coordinated: No Coordination and Movement Description: R hemi UE>LE Finger Nose Finger Test: decreased speed and accuracy on the Rt side compared to the Lt side Motor  Motor Motor: Hemiplegia;Abnormal tone  Trunk/Postural Assessment  Cervical Assessment Cervical Assessment: Exceptions to [facility] (forward head) Thoracic Assessment Thoracic Assessment: Exceptions to [facility] (mild kyphosis) Lumbar Assessment Lumbar Assessment: Exceptions to [facility] (posterior pelvic tilt) Postural Control Postural Control: Deficits on evaluation (decreased in standing during LB dressing + toileting)  Balance Balance Balance Assessed: Yes Static Sitting Balance Static  Sitting - Balance Support: Feet supported Static Sitting - Level of Assistance: 5: Stand by assistance Dynamic Sitting Balance Dynamic Sitting - Balance Support: No upper extremity supported;Feet unsupported Dynamic Sitting - Level of Assistance: 4: Min assist (attempting to wash her Lt foot) Static Standing Balance Static Standing - Balance Support: No upper extremity supported;During functional activity Static Standing - Level of Assistance: 4: Min assist Dynamic Standing Balance Dynamic Standing - Balance Support: No upper extremity supported;During functional activity Dynamic Standing - Level of Assistance: 4: Min assist Dynamic Standing - Balance Activities: Lateral lean/weight shifting;Forward lean/weight shifting (toileting tasks) Extremity/Trunk Assessment RUE Assessment RUE Assessment: Exceptions to [facility] Active Range of Motion (AROM) Comments: Active shoulder ROM <90 degrees, limited internal rotation wit pt unable to use dominant Rt hand for posterior hygiene, also uses the nondominant left hand at dominant level during oral care + grooming tasks (also eating per pt report) LUE Assessment LUE Assessment: Within Functional Limits Active Range of Motion (AROM) Comments: AROM of the shoulder ~80 degrees  Care Tool Care Tool Self Care Eating   Eating Assist Level: Set up assist    Oral Care    Oral Care Assist Level: Minimal Assistance - Patient > 75%    Bathing   Body parts bathed by patient: Chest;Abdomen;Front perineal area;Right upper leg;Left upper leg;Face Body parts bathed by helper: Right arm;Left arm;Buttocks;Right lower leg;Left lower leg   Assist Level: Moderate Assistance - Patient 50 - 74%    Upper Body Dressing(including orthotics)   What is the patient wearing?: Pull over shirt   Assist Level: Moderate Assistance - Patient 50 - 74%    Lower Body Dressing (excluding footwear)   What is the patient wearing?: Pants;Underwear/pull up Assist for lower body  dressing: Maximal Assistance - Patient 25 - 49%    Putting on/Taking off footwear   What is the patient wearing?: Non-skid slipper socks Assist for footwear: Maximal Assistance - Patient 25 - 49%       Care Tool Toileting Toileting activity   Assist for toileting: Moderate Assistance - Patient 50 - 74%     Care Tool Bed Mobility Roll left and right activity   Roll left and right assist level: Minimal Assistance - Patient > 75%    Sit to lying activity   Sit to lying assist level: Minimal Assistance - Patient > 75%    Lying to sitting on side of bed activity   Lying to sitting on side of bed assist level: the ability to move from lying on the back to sitting on the side of the bed with no back support.: Minimal Assistance - Patient > 75%     Care Tool Transfers Sit to stand transfer   Sit to stand assist level: Minimal Assistance - Patient > 75%    Chair/bed transfer   Chair/bed transfer assist level: Minimal  Assistance - Patient > 75%     Toilet transfer   Assist Level: Minimal Assistance - Patient > 75%     Care Tool Cognition  Expression of Ideas and Wants Expression of Ideas and Wants: 3. Some difficulty - exhibits some difficulty with expressing needs and ideas (e.g, some words or finishing thoughts) or speech is not clear  Understanding Verbal and Non-Verbal Content Understanding Verbal and Non-Verbal Content: 2. Sometimes understands - understands only basic conversations or simple, direct phrases. Frequently requires cues to understand   Memory/Recall Ability Memory/Recall Ability : That he or she is in a hospital/hospital unit;Current season   Refer to Care Plan for Long Term Goals  SHORT TERM GOAL WEEK 1 OT Short Term Goal 1 (Week 1): Pt will complete LB dressing with Mod A OT Short Term Goal 2 (Week 1): Pt will complete ambulatory toilet transfer using RW with no more than CGA OT Short Term Goal 3 (Week 1): Pt will complete a shower transfer with CGA using  LRAD  Recommendations for other services: None    Skilled Therapeutic Intervention Skilled OT session completed with focus on initial evaluation, education on OT role/POC, and establishment of patient-centered goals.   Pt greeted in bed, in care of RN who had just assisted her with the [facility]. Pt with no c/o pain. Used the [employer] during tx with pt too HOH for it to really be helpful. Pt appeared to understand most simple verbal instructions, did best with gestures/visuals. ADLs completed EOB sit<stand with RW. She needed assistance to functionally integrate the Rt hand during all tasks, was able to grip wash cloth and clothing with the Rt hand but was limited by proximal weakness, unable to pull up pants on her Rt or use the Rt hand to complete posterior perihygiene. Min A for short distance ambulatory transfer to the [facility] using RW. Per RN, ok to use mesh underwear vs brief due to pt being continent. When pt was assisted back to bed (per request due to fatigue), she reported urgently needing to "urinate" again. Left her in care of NT for stand pivot<BSC. Notified RN of urinary urgency/frequency.   ADL ADL Eating: Not assessed Grooming: Maximal assistance Where Assessed-Grooming: Edge of bed Upper Body Bathing: Minimal assistance Where Assessed-Upper Body Bathing: Edge of bed Lower Body Bathing: Maximal assistance Where Assessed-Lower Body Bathing: Edge of bed Upper Body Dressing: Moderate assistance Where Assessed-Upper Body Dressing: Edge of bed Lower Body Dressing: Maximal assistance Where Assessed-Lower Body Dressing: Edge of bed Toileting: Moderate assistance Where Assessed-Toileting: Bedside Commode Toilet Transfer: Minimal assistance Toilet Transfer Method: Ambulating (RW) Toilet Transfer Equipment: Bedside commode Tub/Shower Transfer: Not assessed Mobility   Min A sit<stands during self care   Discharge Criteria: Patient will be discharged from OT if patient refuses  treatment 3 consecutive times without medical reason, if treatment goals not met, if there is a change in medical status, if patient makes no progress towards goals or if patient is discharged from hospital.  The above assessment, treatment plan, treatment alternatives and goals were discussed and mutually agreed upon: by patient  Skeet Simmer 06/11/2021, 4:16 PM

## 2021-06-11 DIAGNOSIS — I63232 Cerebral infarction due to unspecified occlusion or stenosis of left carotid arteries: Secondary | ICD-10-CM

## 2021-06-11 DIAGNOSIS — T82898D Other specified complication of vascular prosthetic devices, implants and grafts, subsequent encounter: Secondary | ICD-10-CM

## 2021-06-11 LAB — URINALYSIS, ROUTINE W REFLEX MICROSCOPIC
Bilirubin Urine: NEGATIVE
Glucose, UA: NEGATIVE mg/dL
Hgb urine dipstick: NEGATIVE
Ketones, ur: NEGATIVE mg/dL
Leukocytes,Ua: NEGATIVE
Nitrite: NEGATIVE
Protein, ur: NEGATIVE mg/dL
Specific Gravity, Urine: 1.012 (ref 1.005–1.030)
pH: 5 (ref 5.0–8.0)

## 2021-06-11 MED ORDER — SORBITOL 70 % SOLN
60.0000 mL | Freq: Once | Status: AC
  Administered 2021-06-11: 60 mL via ORAL
  Filled 2021-06-11: qty 60

## 2021-06-11 MED ORDER — HYDRALAZINE HCL 25 MG PO TABS
25.0000 mg | ORAL_TABLET | Freq: Three times a day (TID) | ORAL | Status: DC | PRN
  Administered 2021-06-11: 25 mg via ORAL
  Filled 2021-06-11: qty 1

## 2021-06-11 MED ORDER — AMLODIPINE BESYLATE 5 MG PO TABS
5.0000 mg | ORAL_TABLET | Freq: Every day | ORAL | Status: DC
  Administered 2021-06-11 – 2021-06-14 (×4): 5 mg via ORAL
  Filled 2021-06-11 (×4): qty 1

## 2021-06-11 MED ORDER — CARVEDILOL 6.25 MG PO TABS
6.2500 mg | ORAL_TABLET | Freq: Two times a day (BID) | ORAL | Status: DC
  Administered 2021-06-11 – 2021-06-24 (×26): 6.25 mg via ORAL
  Filled 2021-06-11 (×26): qty 1

## 2021-06-11 NOTE — Progress Notes (Signed)
Patient c/o frequency like every 2hours to RN ; MD notified U/a C&S done; Bladder scanned patient after voiding at 1510 at she had 331cc. Patient in therapy will cath after.

## 2021-06-11 NOTE — Discharge Instructions (Addendum)
Inpatient Rehab Discharge Instructions  Denise Leon Discharge date and time: No discharge date for patient encounter.   Activities/Precautions/ Functional Status: Activity: As tolerated Diet: Regular Wound Care: Routine skin checks Functional status:  ___ No restrictions     ___ Walk up steps independently ___ 24/7 supervision/assistance   ___ Walk up steps with assistance ___ Intermittent supervision/assistance  ___ Bathe/dress independently ___ Walk with walker     _x__ Bathe/dress with assistance ___ Walk Independently    ___ Shower independently ___ Walk with assistance    ___ Shower with assistance ___ No alcohol     ___ Return to work/school ________  COMMUNITY REFERRALS UPON DISCHARGE:    Home Health:   PT     OT     ST               Agency: [facility]  Phone: [phone]   Special Instructions: No driving smoking or alcohol   My questions have been answered and I understand these instructions. I will adhere to these goals and the provided educational materials after my discharge from the hospital.  Patient/Caregiver Signature _______________________________ Date __________  Clinician Signature _______________________________________ Date __________  Please bring this form and your medication list with you to all your follow-up doctor's appointments.  STROKE/TIA DISCHARGE INSTRUCTIONS SMOKING Cigarette smoking nearly doubles your risk of having a stroke & is the single most alterable risk factor  If you smoke or have smoked in the last 12 months, you are advised to quit smoking for your health. Most of the excess cardiovascular risk related to smoking disappears within a year of stopping. Ask you doctor about anti-smoking medications [place] Quit Line: 1-800-QUIT NOW Free Smoking Cessation Classes [phone]  CHOLESTEROL Know your levels; limit fat & cholesterol in your diet  Lipid Panel     Component Value Date/Time   CHOL 143 06/09/2021 0228   TRIG 62 06/09/2021  0228   HDL 47 06/09/2021 0228   CHOLHDL 3.0 06/09/2021 0228   VLDL 12 06/09/2021 0228   LDLCALC 84 06/09/2021 0228     Many patients benefit from treatment even if their cholesterol is at goal. Goal: Total Cholesterol (CHOL) less than 160 Goal:  Triglycerides (TRIG) less than 150 Goal:  HDL greater than 40 Goal:  LDL (LDLCALC) less than 100   BLOOD PRESSURE American Stroke Association blood pressure target is less that 120/80 mm/Hg  Your discharge blood pressure is:  BP: (!) 164/75 Monitor your blood pressure Limit your salt and alcohol intake Many individuals will require more than one medication for high blood pressure  DIABETES (A1c is a blood sugar average for last 3 months) Goal HGBA1c is under 7% (HBGA1c is blood sugar average for last 3 months)  Diabetes: No known diagnosis of diabetes    Lab Results  Component Value Date   HGBA1C 5.8 (H) 06/04/2021    Your HGBA1c can be lowered with medications, healthy diet, and exercise. Check your blood sugar as directed by your physician Call your physician if you experience unexplained or low blood sugars.  PHYSICAL ACTIVITY/REHABILITATION Goal is 30 minutes at least 4 days per week  Activity: Increase activity slowly, Therapies: Physical Therapy: Home Health Return to work:  Activity decreases your risk of heart attack and stroke and makes your heart stronger.  It helps control your weight and blood pressure; helps you relax and can improve your mood. Participate in a regular exercise program. Talk with your doctor about the best form of exercise for  you (dancing, walking, swimming, cycling).  DIET/WEIGHT Goal is to maintain a healthy weight  Your discharge diet is:  Diet Order             Diet Heart Room service appropriate? Yes; Fluid consistency: Thin  Diet effective now                   liquids Your height is:  Height: 5\' 4"  (162.6 cm) Your current weight is: Weight: 65.2 kg Your Body Mass Index (BMI) is:  BMI  (Calculated): 24.66 Following the type of diet specifically designed for you will help prevent another stroke. Your goal weight range is:   Your goal Body Mass Index (BMI) is 19-24. Healthy food habits can help reduce 3 risk factors for stroke:  High cholesterol, hypertension, and excess weight.  RESOURCES Stroke/Support Group:  Call [phone]   STROKE EDUCATION PROVIDED/REVIEWED AND GIVEN TO PATIENT Stroke warning signs and symptoms How to activate emergency medical system (call 911). Medications prescribed at discharge. Need for follow-up after discharge. Personal risk factors for stroke. Pneumonia vaccine given: No Flu vaccine given: No My questions have been answered, the writing is legible, and I understand these instructions.  I will adhere to these goals & educational materials that have been provided to me after my discharge from the hospital.

## 2021-06-11 NOTE — Plan of Care (Signed)
   Problem: RH KNOWLEDGE DEFICIT Goal: RH STG INCREASE KNOWLEGDE OF HYPERLIPIDEMIA Description: Patient will be able to manage HLD with medications and dietary modifications using handouts and educational tools with cues Outcome: Not Progressing; ; confused at times

## 2021-06-11 NOTE — H&P (Signed)
Physical Medicine and Rehabilitation Admission H&P        Chief Complaint  Patient presents with   Weakness   Code Stroke  : HPI: Denise Leon is a 75 year old non-English-speaking right-handed female.  Her first language is TAGALOG.History of hearing loss, asthma, CKD stage III, hypertension as well as patchy infarct posterior left frontal region status post tPA/loop recorder with right side residual weakness maintained on full-strength aspirin and received CIR 11/16/2019 to 12/04/2019 and discharged to home ambulating with a rolling walker close supervision.  Per chart review patient lives with her daughter and son-in-law two-level home bed and bath main level.  Daughter works with a Adult nurse at [employer].  Family works split shifts and can provide assistance as needed.  Presented 06/04/2021 with right side numbness and increasing weakness.  CT of the head showed chronic ischemic microangiopathy and generalized atrophy without acute abnormality.  CT angiogram head and neck showed aortic atherosclerosis and left ICA mixed plaque but no significant stenosis.  There was a 2 cm left thyroid nodule recommend thyroid ultrasound as outpatient.  MRI identified patchy acute ischemic infarct involving the left parieto-occipital region associated petechial hemorrhage without frank intraparenchymal hematoma.  No significant mass-effect.  Vascular surgery consulted in regards to carotid atherosclerosis due to the fact patient was symptomatic underwent transcarotid placement and stenting 06/08/2021 per Dr. Randie Heinz.  Patient is currently maintained on aspirin 325 mg daily as well as Plavix 75 mg daily for CVA prophylaxis.  Tolerating a regular consistency diet.  Therapy evaluations completed due to patient decreased functional mobility right side weakness was admitted for a comprehensive rehab program.     Pt reports L neck hurts s/p carotid stenting- denies pain, but does c/o constipation- doesn't  remember when had LBM.  Is voiding.    Review of Systems  Constitutional:  Negative for chills and fever.  HENT:  Positive for hearing loss.   Eyes:  Negative for blurred vision and double vision.  Respiratory:  Negative for cough and shortness of breath.   Cardiovascular:  Negative for chest pain, palpitations and leg swelling.  Gastrointestinal:  Positive for constipation. Negative for nausea.  Genitourinary:  Negative for dysuria, flank pain and hematuria.  Musculoskeletal:  Positive for myalgias.  Skin:  Negative for rash.  Neurological:  Positive for weakness.  All other systems reviewed and are negative.     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 She has never used smokeless tobacco. She reports that she does not drink alcohol and does not use drugs. Allergies: No Known Allergies       Medications Prior to Admission  Medication Sig Dispense Refill   amLODipine (NORVASC) 5 MG tablet TAKE 1 TABLET(5 MG) BY MOUTH DAILY (Patient taking differently: Take  5 mg by mouth daily.) 90 tablet 1   aspirin EC 325 MG EC tablet Take 1 tablet (325 mg total) by mouth daily. 30 tablet 0   atorvastatin (LIPITOR) 80 MG tablet Take 1 tablet (80 mg total) by mouth daily. 30 tablet 0   carvedilol (COREG) 6.25 MG tablet Take 1 tablet (6.25 mg total) by mouth 2 (two) times daily. 180 tablet 3   DULoxetine (CYMBALTA) 30 MG capsule Take 1 capsule (30 mg total) by  mouth daily. 30 capsule 3   latanoprost (XALATAN) 0.005 % ophthalmic solution Place 1 drop into both eyes at bedtime. 2.5 mL 12   Vitamin A 2400 MCG (8000 UT) CAPS Take 2,400 mcg by mouth daily.       vitamin C (ASCORBIC ACID) 500 MG tablet Take 500 mg by mouth daily.          Drug Regimen Review Drug regimen was reviewed and remains appropriate with no significant issues identified   Home: Home Living Family/patient expects to be discharged to:: Private residence Living Arrangements:  (lives with daughter and her family) Available Help at Discharge: Family, Available 24 hours/day (daughter works days and son in law works nights so has 24/7 supervision) Type of Home: House Home Access: Level entry Home Layout: Two level, Able to live on main level with bedroom/bathroom Bathroom Shower/Tub: Health visitor: Standard Additional Comments: info gleaned from previous admission.  Per chart review and pt input.  She lives with her daughter and reports her daughter works as a PT at [employer] With: Daughter   Functional History: Prior Function Level of Independence: Independent with assistive device(s) Comments: pt indicates she was able to complete ADLs Mod I.  Chart review indicates pt was able to ambulate 100 ft with RW, and required supervision for ADLs, including set up when she was discharged from [facility] 11/2019   Functional Status:  Mobility: Bed Mobility Overal bed mobility: Needs Assistance Bed Mobility: Supine to Sit, Sit to Supine Supine to sit: Supervision Sit to supine: Supervision General bed mobility comments: supervision for safety Transfers Overall transfer level: Needs assistance Equipment used: Rolling walker (2 wheeled) Transfers: Sit to/from Stand Sit to Stand: Min assist Stand pivot transfers: Min assist General transfer comment: minA to rise and steady. Unsteady upon standing Ambulation/Gait Ambulation/Gait assistance: Min assist Gait  Distance (Feet): 50 Feet Assistive device: Rolling walker (2 wheeled) Gait Pattern/deviations: Step-through pattern, Decreased stride length, Decreased weight shift to right, Decreased stance time - right, Trunk flexed General Gait Details: decreased stance time on R with patient complaining of R buttock pain. MinA for balance and RW management primarily during turns Gait velocity: decreased Gait velocity interpretation: <1.31 ft/sec, indicative of household ambulator   ADL: ADL Overall ADL's : Needs assistance/impaired Eating/Feeding: Minimal assistance, Bed level Grooming: Wash/dry hands, Wash/dry face, Oral care, Brushing hair, Minimal assistance, Standing Grooming Details (indicate cue type and reason): completed while standing at sink level Upper Body Bathing: Moderate assistance, Sitting Lower Body Bathing: Moderate assistance, Sit to/from stand Upper Body Dressing : Moderate assistance, Sitting Lower Body Dressing: Moderate assistance, Sit to/from stand Lower Body Dressing Details (indicate cue type and reason): requires mod A to don socks EOB Toilet Transfer: Minimal assistance, RW Toilet Transfer Details (indicate cue type and reason): pt voided, provided BSC over toilet, pt with increased steadiness and control Toileting- Clothing Manipulation and Hygiene: Minimal assistance, Sit to/from stand Toileting - Clothing Manipulation Details (indicate cue type and reason): for assistance with  mesh briefs Functional mobility during ADLs: Minimal assistance, Rolling walker General ADL Comments: cues for safe use of RW, stability, cues for sequencing.   Cognition: Cognition Overall Cognitive Status: No family/caregiver present to determine baseline cognitive functioning Arousal/Alertness: Awake/alert Orientation Level: Oriented to person, Disoriented to time, Disoriented to situation Attention: Sustained Sustained Attention: Appears intact Immediate Memory Recall: Sock, Blue,  Bed Awareness: Appears intact Problem Solving: Appears intact Safety/Judgment: Appears intact Cognition Arousal/Alertness: Awake/alert Behavior During Therapy: Flat affect Overall Cognitive Status: No family/caregiver present to determine baseline cognitive functioning General Comments: requires multimodal cueing. Patient declined use of stratus interpreter. Able to understand and respond appropriately to basic commands in [place].   Physical Exam: Blood pressure 127/68, pulse 69, temperature 98.2 F (36.8 C), temperature source Oral, resp. rate 13, height 5\' 4"  (1.626 m), weight 61.5 kg, SpO2 100 %. Physical Exam Vitals and nursing note reviewed.  Constitutional:      Appearance: She is normal weight.     Comments: Elderly female sitting up in chair at bedside; Very HOH and english is 2nd language, NAD  HENT:     Head: Normocephalic and atraumatic.     Comments: Smile equal and tongue midline    Right Ear: External ear normal.     Left Ear: External ear normal.     Nose: Nose normal. No congestion.     Mouth/Throat:     Mouth: Mucous membranes are dry.     Pharynx: Oropharynx is clear. No oropharyngeal exudate.  Eyes:     General:        Left eye: No discharge.     Extraocular Movements: Extraocular movements intact.  Neck:     Comments: L anterior neck incision- large/purple- forming keloid?- closed with glue; appears swollen- but better per Vasc.  Cardiovascular:     Comments: Borderline tachycardia- Regular rhythm, no JVD seen Pulmonary:     Comments: CTA B/L- no W/R/R- good air movement Abdominal:     Comments: Soft, NT, slightly distended vs protuberant; hypoactive BS  Genitourinary:    Comments: - let nurse know- The purewick is actually not in place- on floor-  Musculoskeletal:     Comments: RUE- biceps 4-/5, triceps 4-/5, grip 3+/5, FA 2+/5 LUE- 5-5 in same muscles RLE- HF 4-/5, KE 4-/5, DF and PF 4/5 LLE- 5-/5 in same muscles  Skin:    Comments: L AC fossa IV-  some blood in line- R wrist IV ok   Neurological:     Comments: Patient is alert.  Proximal eye contact with examiner.  There is a language barrier and she is very hard of hearing.  She can provide some intelligible yes no responses.  Follows simple demonstrated commands. Intact to light touch in all 4 extremities as well as face  Psychiatric:     Comments: Slightly flat      Lab Results Last 48 Hours        Results for orders placed or performed during the hospital encounter of 06/03/21 (from the past 48 hour(s))  Basic metabolic panel     Status: Abnormal    Collection Time: 06/08/21  4:33 AM  Result Value Ref Range    Sodium 138 135 - 145 mmol/L    Potassium 3.5 3.5 - 5.1 mmol/L    Chloride 108 98 - 111 mmol/L    CO2 23 22 - 32 mmol/L    Glucose, Bld 103 (H) 70 - 99 mg/dL      Comment: Glucose reference range  applies only to samples taken after fasting for at least 8 hours.    BUN 17 8 - 23 mg/dL    Creatinine, Ser 8.33 0.44 - 1.00 mg/dL    Calcium 8.5 (L) 8.9 - 10.3 mg/dL    GFR, Estimated >82 >50 mL/min      Comment: (NOTE) Calculated using the CKD-EPI Creatinine Equation (2021)      Anion gap 7 5 - 15      Comment: Performed at [facility], [address] [place], [place] [address]  CBC     Status: Abnormal    Collection Time: 06/08/21  4:33 AM  Result Value Ref Range    WBC 8.4 4.0 - 10.5 K/uL    RBC 3.93 3.87 - 5.11 MIL/uL    Hemoglobin 12.1 12.0 - 15.0 g/dL    HCT 73.4 (L) 19.3 - 46.0 %    MCV 90.1 80.0 - 100.0 fL    MCH 30.8 26.0 - 34.0 pg    MCHC 34.2 30.0 - 36.0 g/dL    RDW 79.0 24.0 - 97.3 %    Platelets 264 150 - 400 K/uL    nRBC 0.0 0.0 - 0.2 %      Comment: Performed at [facility], [address] [place], [place] [address]  Surgical pcr screen     Status: None    Collection Time: 06/08/21  7:22 AM    Specimen: Nasal Mucosa; Nasal Swab  Result Value Ref Range    MRSA, PCR NEGATIVE NEGATIVE    Staphylococcus aureus NEGATIVE NEGATIVE       Comment: (NOTE) The Xpert SA Assay (FDA approved for NASAL specimens in patients 69 years of age and older), is one component of a comprehensive surveillance program. It is not intended to diagnose infection nor to guide or monitor treatment. Performed at [facility], [address], [place], [place] [address]    Type and screen [facility]     Status: None    Collection Time: 06/08/21 11:10 AM  Result Value Ref Range    ABO/RH(D) O POS      Antibody Screen NEG      Sample Expiration          06/11/2021,2359 Performed at [facility], [address] [place], [place] [address]    ABO/Rh     Status: None    Collection Time: 06/08/21 11:16 AM  Result Value Ref Range    ABO/RH(D)          O POS Performed at [facility], [address], [place], [place] [address]    POCT Activated clotting time     Status: None    Collection Time: 06/08/21  1:22 PM  Result Value Ref Range    Activated Clotting Time 306 seconds  Lipid panel     Status: None    Collection Time: 06/09/21  2:28 AM  Result Value Ref Range    Cholesterol 143 0 - 200 mg/dL    Triglycerides 62 <989 mg/dL    HDL 47 >21 mg/dL    Total CHOL/HDL Ratio 3.0 RATIO    VLDL 12 0 - 40 mg/dL    LDL Cholesterol 84 0 - 99 mg/dL      Comment:        Total Cholesterol/HDL:CHD Risk Coronary Heart Disease Risk Table  Men   Women  1/2 Average Risk   3.4   3.3  Average Risk       5.0   4.4  2 X Average Risk   9.6   7.1  3 X Average Risk  23.4   11.0        Use the calculated Patient Ratio above and the CHD Risk Table to determine the patient's CHD Risk.        ATP III CLASSIFICATION (LDL):  <100     mg/dL   Optimal  [id number]  mg/dL   Near or Above                    Optimal  130-159  mg/dL   Borderline  [id number]  mg/dL   High  >811>190     mg/dL   Very High Performed at [facility], [address] [place], [place] [address]    Basic metabolic panel     Status:  Abnormal    Collection Time: 06/09/21  2:40 AM  Result Value Ref Range    Sodium 137 135 - 145 mmol/L    Potassium 4.8 3.5 - 5.1 mmol/L    Chloride 107 98 - 111 mmol/L    CO2 20 (L) 22 - 32 mmol/L    Glucose, Bld 135 (H) 70 - 99 mg/dL      Comment: Glucose reference range applies only to samples taken after fasting for at least 8 hours.    BUN 22 8 - 23 mg/dL    Creatinine, Ser 2.951.22 (H) 0.44 - 1.00 mg/dL      Comment: DELTA CHECK NOTED    Calcium 8.2 (L) 8.9 - 10.3 mg/dL    GFR, Estimated 47 (L) >60 mL/min      Comment: (NOTE) Calculated using the CKD-EPI Creatinine Equation (2021)      Anion gap 10 5 - 15      Comment: Performed at [facility], [address] [place], [place] [address]  CBC     Status: Abnormal    Collection Time: 06/09/21  2:40 AM  Result Value Ref Range    WBC 14.8 (H) 4.0 - 10.5 K/uL    RBC 3.84 (L) 3.87 - 5.11 MIL/uL    Hemoglobin 12.0 12.0 - 15.0 g/dL    HCT 86.535.3 (L) 78.436.0 - 46.0 %    MCV 91.9 80.0 - 100.0 fL    MCH 31.3 26.0 - 34.0 pg    MCHC 34.0 30.0 - 36.0 g/dL    RDW 69.612.8 29.511.5 - 28.415.5 %    Platelets 247 150 - 400 K/uL    nRBC 0.0 0.0 - 0.2 %      Comment: Performed at [facility], [address] [place], [place] [address]  Hepatic function panel     Status: Abnormal    Collection Time: 06/09/21  2:40 AM  Result Value Ref Range    Total Protein 5.6 (L) 6.5 - 8.1 g/dL    Albumin 2.9 (L) 3.5 - 5.0 g/dL    AST 38 15 - 41 U/L    ALT 24 0 - 44 U/L    Alkaline Phosphatase 43 38 - 126 U/L    Total Bilirubin 0.9 0.3 - 1.2 mg/dL    Bilirubin, Direct 0.2 0.0 - 0.2 mg/dL    Indirect Bilirubin 0.7 0.3 - 0.9 mg/dL      Comment: Performed at [facility], [address], [place], [place] [address]       Imaging Results (Last 48 hours)  Structural Heart Procedure   Result Date: 06/08/2021 See surgical note for result.   HYBRID OR IMAGING ([facility] ONLY)   Result Date: 06/08/2021 There is no interpretation for this exam.  This order is  for images obtained during a surgical procedure.  Please See "Surgeries" Tab for more information regarding the procedure.             Medical Problem List and Plan: 1.  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  Status post transcarotid placement/stenting 06/08/2021             -patient may  shower as long as covers incision             -ELOS/Goals: 10-14 days- min A 2.  Antithrombotics: -DVT/anticoagulation:  Mechanical: Sequential compression devices, below knee Bilateral lower extremities             -antiplatelet therapy: aspirin 325 mg daily and Plavix 75 mg daily 3. Pain Management: Oxycodone as needed 4. Mood: Cymbalta 30 mg daily             -antipsychotic agents: N/A 5. Neuropsych: This patient is capable of making decisions on her own behalf. 6. Skin/Wound Care: Routine skin checks 7. Fluids/Electrolytes/Nutrition: Routine in and outs with follow-up chemistries 8.  Hyperlipidemia.  Lipitor 9.  CKD stage III.Creatinine baseline 1.10-1.22 10.  Incidental 2 cm left thyroid nodule.  Follow-up outpatient 11.Permissive Hypertension.Norvasc  daily and Coreg 6.25.mg daily PTA and resume as needed.- were taking prior to admission- will start if needed (was needed, so restarted both for BP of 180s/90s and HR of low 100s). Will also start hydralazine 25 mg q8 hours prn for SBP >180 and DBP >100.  12. Urinary frequency- will check U/A and Cx (was (-)- ). Will monitor 13. Constipation- will give Sorbitol  60cc x1 and see if can get pt cleaned out.        Mcarthur Rossetti Angiulli, PA-C 06/09/2021    I have personally performed a face to face diagnostic evaluation of this patient and formulated the key components of the plan.  Additionally, I have personally reviewed laboratory data, imaging studies, as well as relevant notes and concur with the physician assistant's  documentation above.   The patient's status has not changed from the original H&P.  Any changes in documentation from the acute care chart have been noted above.

## 2021-06-11 NOTE — Evaluation (Signed)
Physical Therapy Assessment and Plan  Patient Details  Name: Denise Leon MRN: [id number] Date of Birth: [birth year]  PT Diagnosis: Abnormal posture, Abnormality of gait, Cognitive deficits, Coordination disorder, Difficulty walking, Edema, Hemiplegia dominant, and Hypotonia Rehab Potential: Good ELOS: 10-14 days   Today's Date: 06/11/2021 PT Individual Time: 8101-7510 and 1515-1545 PT Individual Time Calculation (min): 30 min    Hospital Problem: Principal Problem:   CVA (cerebral vascular accident) ([place]) Active Problems:   Acute ischemic stroke ([place]) patchy L frontal infarcts s/p tPA   CKD (chronic kidney disease), stage IIIa   Occlusion of carotid stent, subsequent encounter   Past Medical History:  Past Medical History:  Diagnosis Date   Acute ischemic stroke ([place]) patchy L frontal infarcts s/p tPA 11/14/2019   Asthma    CKD (chronic kidney disease), stage IIIb 11/16/2019   Hyperlipidemia 11/16/2019   Hypertension    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   Palpitations 11/16/2019   Stroke ([place]) 11/14/2019   Thyroid nodule, L 11/16/2019   Past Surgical History:  Past Surgical History:  Procedure Laterality Date   HIP SURGERY     NECK SURGERY     TRANSCAROTID ARTERY REVASCULARIZATION  Left 06/08/2021   Procedure: LEFT TRANSCAROTID ARTERY REVASCULARIZATION;  Surgeon: Waynetta Sandy, MD;  Location: [facility];  Service: Vascular;  Laterality: Left;   ULTRASOUND GUIDANCE FOR VASCULAR ACCESS Right 06/08/2021   Procedure: ULTRASOUND GUIDANCE FOR VASCULAR ACCESS, RIGHT FEMORAL VEIN;  Surgeon: Waynetta Sandy, MD;  Location: [facility] OR;  Service: Vascular;  Laterality: Right;    Assessment & Plan Clinical Impression: Patient is a 75 y.o. year old non-English-speaking right-handed female.  Her first language is TAGALOG.History of hearing loss, asthma, CKD stage III, hypertension as well as patchy infarct posterior left frontal region status post tPA/loop recorder  with right side residual weakness maintained on full-strength aspirin and received CIR 11/16/2019 to 12/04/2019 and discharged to home ambulating with a rolling walker close supervision.  Per chart review patient lives with her daughter and son-in-law two-level home bed and bath main level.  Daughter works with a Community education officer at [employer].  Family works split shifts and can provide assistance as needed.  Presented 06/04/2021 with right side numbness and increasing weakness.  CT of the head showed chronic ischemic microangiopathy and generalized atrophy without acute abnormality.  CT angiogram head and neck showed aortic atherosclerosis and left ICA mixed plaque but no significant stenosis.  There was a 2 cm left thyroid nodule recommend thyroid ultrasound as outpatient.  MRI identified patchy acute ischemic infarct involving the left parieto-occipital region associated petechial hemorrhage without frank intraparenchymal hematoma.  No significant mass-effect.  Vascular surgery consulted in regards to carotid atherosclerosis due to the fact patient was symptomatic underwent transcarotid placement and stenting 06/08/2021 per Dr. Donzetta Matters.  Patient is currently maintained on aspirin 325 mg daily as well as Plavix 75 mg daily for CVA prophylaxis.  Tolerating a regular consistency diet.  Therapy evaluations completed due to patient decreased functional mobility right side weakness was admitted for a comprehensive rehab program.   Patient transferred to CIR on 06/10/2021 .   Patient currently requires min with mobility secondary to decreased cardiorespiratoy endurance, impaired timing and sequencing and decreased motor planning, decreased problem solving and decreased memory, and decreased sitting balance, decreased standing balance, decreased postural control, hemiplegia, and decreased balance strategies.  Prior to hospitalization, patient was modified independent  with mobility and lived with Daughter (daughter +  son-in-law per  chart) in a House home.  Home access is  Level entry.  Patient will benefit from skilled PT intervention to maximize safe functional mobility, minimize fall risk, and decrease caregiver burden for planned discharge home with 24 hour supervision.  Anticipate patient will benefit from follow up OP at discharge.  PT - End of Session Activity Tolerance: Tolerates 10 - 20 min activity with multiple rests Endurance Deficit: Yes PT Assessment Rehab Potential (ACUTE/IP ONLY): Good PT Barriers to Discharge: Neurogenic Bowel & Bladder;Home environment access/layout;Behavior PT Patient demonstrates impairments in the following area(s): Balance;Behavior;Edema;Endurance;Motor;Nutrition;Pain;Perception;Safety;Skin Integrity;Sensory PT Transfers Functional Problem(s): Bed Mobility;Bed to Chair;Car;Furniture PT Locomotion Functional Problem(s): Ambulation;Wheelchair Mobility;Stairs PT Plan PT Intensity: Minimum of 1-2 x/day ,45 to 90 minutes PT Frequency: 5 out of 7 days PT Duration Estimated Length of Stay: 10-14 days PT Treatment/Interventions: Ambulation/gait training;Discharge planning;Functional mobility training;Psychosocial support;Therapeutic Activities;Visual/perceptual remediation/compensation;Wheelchair propulsion/positioning;Therapeutic Exercise;Skin care/wound management;Neuromuscular re-education;Disease management/prevention;Balance/vestibular training;Cognitive remediation/compensation;DME/adaptive equipment instruction;Pain management;Splinting/orthotics;UE/LE Strength taining/ROM;UE/LE Coordination activities;Stair training;Patient/family education;Functional electrical stimulation;Community reintegration PT Transfers Anticipated Outcome(s): mod I using LRAD PT Locomotion Anticipated Outcome(s): Supervision using LRAD >150 ft PT Recommendation Follow Up Recommendations: Outpatient PT Patient destination: Home Equipment Details: patient reports that she has RW, need to confirm  with family due to cognitive deficits   PT Evaluation Precautions/Restrictions Precautions Precautions: Fall Precaution Comments: HoH better hearing in her Rt ear, speaks Tagalog and some English Restrictions Weight Bearing Restrictions: No Pain Pain Assessment Pain Scale: 0-10 Pain Score: 0-No pain Faces Pain Scale: No hurt Multiple Pain Sites: No Pain Interference Pain Interference Pain Effect on Sleep: 1. Rarely or not at all Pain Interference with Therapy Activities: 1. Rarely or not at all Pain Interference with Day-to-Day Activities: 1. Rarely or not at all Home Living/Prior [place] Available Help at Discharge: Family;Available 24 hours/day Type of Home: House Home Access: Level entry Home Layout: Two level;Able to live on main level with bedroom/bathroom Bathroom Shower/Tub: Multimedia programmer: Standard Additional Comments: Information per chart review and pt input.  She lives with her daughter and reports her daughter works as a PT at [employer] With: Daughter (daughter + son-in-law per chart) Prior Function Level of Independence: Requires assistive device for independence;Independent with homemaking with ambulation;Independent with transfers;Independent with basic ADLs Driving: No (pt reports she did drive PTA but that family drove most often. ?accuracy of information) Vocation: Retired Vision/Perception  Vision - History Ability to See in Adequate Light: 0 Adequate Vision - Assessment Eye Alignment: Within Functional Limits Alignment/Gaze Preference: Within Defined Limits Tracking/Visual Pursuits: Decreased smoothness of eye movement to LEFT superior field;Decreased smoothness of eye movement to LEFT inferior field Saccades: Decreased speed of saccadic movement Perception Perception: Impaired Inattention/Neglect: Does not attend to right side of body Praxis Praxis: Impaired Praxis Impairment Details: Motor planning   Cognition Overall Cognitive Status: No family/caregiver present to determine baseline cognitive functioning Arousal/Alertness: Awake/alert Orientation Level: Oriented to person;Disoriented to place;Oriented to time Day of Week: Incorrect Attention: Sustained Sustained Attention: Appears intact Memory: Impaired Memory Impairment: Decreased short term memory Decreased Short Term Memory: Verbal basic Awareness: Appears intact Problem Solving: Impaired Problem Solving Impairment: Functional basic Executive Function: Sequencing;Organizing;Self Correcting Sequencing: Impaired Sequencing Impairment: Verbal complex;Verbal basic Organizing: Impaired Organizing Impairment: Verbal basic;Verbal complex Self Correcting: Impaired Self Correcting Impairment: Verbal basic;Functional basic Safety/Judgment: Appears intact [employer] Scales of Cognitive Functioning: Confused/appropriate Sensation Sensation Light Touch: Appears Intact Proprioception: Impaired Detail Proprioception Impaired Details: Absent RUE;Absent RLE Coordination Gross Motor Movements are Fluid and Coordinated: No  Fine Motor Movements are Fluid and Coordinated: No Coordination and Movement Description: R hemi UE>LE Motor  Motor Motor: Hemiplegia;Abnormal tone   Trunk/Postural Assessment  Cervical Assessment Cervical Assessment: Exceptions to [facility] (forward head) Thoracic Assessment Thoracic Assessment: Exceptions to [facility] (mild kyphosis) Lumbar Assessment Lumbar Assessment: Exceptions to [facility] (posterior pelvic tilt) Postural Control Postural Control: Deficits on evaluation (decreased/delayed)  Balance Balance Balance Assessed: Yes Static Sitting Balance Static Sitting - Balance Support: No upper extremity supported;Feet supported Static Sitting - Level of Assistance: 5: Stand by assistance Dynamic Sitting Balance Dynamic Sitting - Balance Support: No upper extremity supported;Feet unsupported Dynamic Sitting -  Level of Assistance: 4: Min assist Static Standing Balance Static Standing - Balance Support: No upper extremity supported;During functional activity Static Standing - Level of Assistance: 4: Min assist Dynamic Standing Balance Dynamic Standing - Balance Support: Right upper extremity supported;Left upper extremity supported;During functional activity Dynamic Standing - Level of Assistance: 4: Min assist Extremity Assessment  RLE Assessment RLE Assessment: Exceptions to [facility] Active Range of Motion (AROM) Comments: WFL for all functional mobility General Strength Comments: Grossly 3+/5 limited testing due to cognitive vs hearing, vs language deficits LLE Assessment LLE Assessment: Exceptions to [facility] Active Range of Motion (AROM) Comments: WLF for all functional mobility General Strength Comments: Grossly > or = to 4/5 throughout with functional mobility and attempts at testing, limited due to cognitive vs hearing vs language deficits  Care Tool Care Tool Bed Mobility Roll left and right activity   Roll left and right assist level: Minimal Assistance - Patient > 75%    Sit to lying activity   Sit to lying assist level: Minimal Assistance - Patient > 75%    Lying to sitting on side of bed activity   Lying to sitting on side of bed assist level: the ability to move from lying on the back to sitting on the side of the bed with no back support.: Minimal Assistance - Patient > 75%     Care Tool Transfers Sit to stand transfer   Sit to stand assist level: Minimal Assistance - Patient > 75%    Chair/bed transfer   Chair/bed transfer assist level: Minimal Assistance - Patient > 75%     Toilet transfer   Assist Level: Minimal Assistance - Patient > 75%    Car transfer   Car transfer assist level: Minimal Assistance - Patient > 75%      Care Tool Locomotion Ambulation   Assist level: Minimal Assistance - Patient > 75% Assistive device: Walker-rolling Max distance: 55 ft  Walk 10  feet activity   Assist level: Minimal Assistance - Patient > 75% Assistive device: Walker-rolling   Walk 50 feet with 2 turns activity   Assist level: Minimal Assistance - Patient > 75% Assistive device: Walker-rolling  Walk 150 feet activity Walk 150 feet activity did not occur: Safety/medical concerns      Walk 10 feet on uneven surfaces activity Walk 10 feet on uneven surfaces activity did not occur: Safety/medical concerns      Stairs   Assist level: Minimal Assistance - Patient > 75% Stairs assistive device: 2 hand rails;1 hand rail Max number of stairs: 4  Walk up/down 1 step activity   Walk up/down 1 step (curb) assist level: Minimal Assistance - Patient > 75% Walk up/down 1 step or curb assistive device: 1 hand rail;2 hand rails    Walk up/down 4 steps activity Walk up/down 4 steps assist level: Minimal Assistance - Patient > 75%  Walk up/down 4 steps assistive device: 1 hand rail;2 hand rails  Walk up/down 12 steps activity Walk up/down 12 steps activity did not occur: Safety/medical concerns      Pick up small objects from floor Pick up small object from the floor (from standing position) activity did not occur: Safety/medical concerns      Wheelchair   Type of Wheelchair: Manual   Wheelchair assist level: Total Assistance - Patient < 25% Max wheelchair distance: 150 ft  Wheel 50 feet with 2 turns activity   Assist Level: Total Assistance - Patient < 25%  Wheel 150 feet activity   Assist Level: Total Assistance - Patient < 25%    Refer to Care Plan for Long Term Goals  SHORT TERM GOAL WEEK 1 PT Short Term Goal 1 (Week 1): Patient will perform bed mobility with supervision. PT Short Term Goal 2 (Week 1): Patient will perform basic transfers with supervision using LRAD. PT Short Term Goal 3 (Week 1): Patient will ambulate >100 feet with CGA using LRAD.  Recommendations for other services: None   Skilled Therapeutic Intervention Evaluation completed (see  details above and below) with education on PT POC and goals and individual treatment initiated with focus on functional mobility/transfers, LE strength, dynamic standing balance/coordination, ambulation, stair navigation, simulated car transfers, and improved endurance with activity Patient provided with 16" X16" wheelchair with air cell cushion and adjustments made to promote optimal seating posture and pressure distribution. Patient also provided with RW for use in room and therapist adjusted to proper height for patient.  1015: Patient in bed asleep upon PT arrival. Patient aroused to verbal stimulus and agreeable to PT session. Patient denied pain during session.  Vitals: BP 184/88 RN made aware and called MD. Plan for med admin and PT to return in 30 min to reinitiate evaluation.   1045: Upon PT return patient in bed asleep. Patient easily aroused and agreeable to continued PT eval.   Vitals: BP 158/76, HR 95, RN made aware.  Therapeutic Activity: Bed Mobility: Patient performed rolling R/L and supine to sit with min A for trunk and R arm management.  Transfers: Patient performed sit to/from stand, stand pivot bed>BSC and BSC>w/c with min A with HHA. Patient was continent of bladder using BSC. Performed peri-care and lower body clothing management with total A due to balance deficits in standing, min A for balance.   SLP arrived with patient in w/c to initiate evaluation. PT will attempt to see patient this afternoon due to schedule adjustments secondary to elevated BP this morning. Patient in w/c handed off to SLP at end of session.  1515: Patient in bed upon PT arrival. Patient alert and agreeable to PT session. Patient denied pain during session.  Therapeutic Activity: Bed Mobility: Patient performed supine to/from sit with min A for trunk to come to sitting and for R lower extremity management to return to supine in a flat bed without use of bed rails. Provided verbal cues for pushing  through elbows to sit up rather than pulling up on PT. Transfers: Patient performed sit to/from stand x2 and stand pivot bed<>w/c and bed<>BSC with CGA using RW. Provided verbal cues for hand placement on RW,R hand undershooting placement, and reaching back to sit to control descent. Patient was continent of bladder during session, performed peri-care and lower body clothing management as above. Encouraged patient and staff to have patient ambulate to/from the bathroom for increased mobility during the day, safety plan updated.   Gait  Training:  Patient ambulated 55 feet using RW with min A. Ambulated with shuffling gait and decreased R weight shift. Distance limited by patient fatigue, reported, "I am tired."  Wheelchair Mobility:  Patient propelled wheelchair 10 feet with mod A for R hand placement with each stroke due to poor motor planning and neglect, patient then transported with dependent assist >150 ft during session.   Patient in bed at end of session with breaks locked, bed alarm set, and all needs within reach.   Instructed pt in results of PT evaluation as detailed above, PT POC, rehab potential, rehab goals, and discharge recommendations. Additionally discussed CIR's policies regarding fall safety and use of chair alarm and/or quick release belt. Pt verbalized understanding and in agreement. Will update pt's family members as they become available.    Discharge Criteria: Patient will be discharged from PT if patient refuses treatment 3 consecutive times without medical reason, if treatment goals not met, if there is a change in medical status, if patient makes no progress towards goals or if patient is discharged from hospital.  The above assessment, treatment plan, treatment alternatives and goals were discussed and mutually agreed upon: No family available/patient unable  Bryony Kaman L Nivin Braniff PT, DPT  06/11/2021, 4:04 PM

## 2021-06-11 NOTE — Progress Notes (Signed)
BP 184/88. MD notified new orders noted

## 2021-06-11 NOTE — Plan of Care (Signed)
  Problem: RH Balance Goal: LTG Patient will maintain dynamic standing with ADLs (OT) Description: LTG:  Patient will maintain dynamic standing balance with assist during activities of daily living (OT)  Flowsheets (Taken 06/11/2021 1624) LTG: Pt will maintain dynamic standing balance during ADLs with: Supervision/Verbal cueing   Problem: Sit to Stand Goal: LTG:  Patient will perform sit to stand in prep for activites of daily living with assistance level (OT) Description: LTG:  Patient will perform sit to stand in prep for activites of daily living with assistance level (OT) Flowsheets (Taken 06/11/2021 1624) LTG: PT will perform sit to stand in prep for activites of daily living with assistance level: Supervision/Verbal cueing   Problem: RH Grooming Goal: LTG Patient will perform grooming w/assist,cues/equip (OT) Description: LTG: Patient will perform grooming with assist, with/without cues using equipment (OT) Flowsheets (Taken 06/11/2021 1624) LTG: Pt will perform grooming with assistance level of: Supervision/Verbal cueing   Problem: RH Bathing Goal: LTG Patient will bathe all body parts with assist levels (OT) Description: LTG: Patient will bathe all body parts with assist levels (OT) Flowsheets (Taken 06/11/2021 1624) LTG: Pt will perform bathing with assistance level/cueing: Supervision/Verbal cueing   Problem: RH Dressing Goal: LTG Patient will perform upper body dressing (OT) Description: LTG Patient will perform upper body dressing with assist, with/without cues (OT). Flowsheets (Taken 06/11/2021 1624) LTG: Pt will perform upper body dressing with assistance level of: Supervision/Verbal cueing Goal: LTG Patient will perform lower body dressing w/assist (OT) Description: LTG: Patient will perform lower body dressing with assist, with/without cues in positioning using equipment (OT) Flowsheets (Taken 06/11/2021 1624) LTG: Pt will perform lower body dressing with assistance level  of: Supervision/Verbal cueing   Problem: RH Toileting Goal: LTG Patient will perform toileting task (3/3 steps) with assistance level (OT) Description: LTG: Patient will perform toileting task (3/3 steps) with assistance level (OT)  Flowsheets (Taken 06/11/2021 1624) LTG: Pt will perform toileting task (3/3 steps) with assistance level: Supervision/Verbal cueing   Problem: RH Toilet Transfers Goal: LTG Patient will perform toilet transfers w/assist (OT) Description: LTG: Patient will perform toilet transfers with assist, with/without cues using equipment (OT) Flowsheets (Taken 06/11/2021 1624) LTG: Pt will perform toilet transfers with assistance level of: Supervision/Verbal cueing   Problem: RH Tub/Shower Transfers Goal: LTG Patient will perform tub/shower transfers w/assist (OT) Description: LTG: Patient will perform tub/shower transfers with assist, with/without cues using equipment (OT) Flowsheets (Taken 06/11/2021 1624) LTG: Pt will perform tub/shower stall transfers with assistance level of: Supervision/Verbal cueing

## 2021-06-11 NOTE — Progress Notes (Signed)
Self cath education started. Provided hand outs.

## 2021-06-11 NOTE — Evaluation (Addendum)
Speech Language Pathology Assessment and Plan  Patient Details  Name: Denise Leon MRN: [id number] Date of Birth: [birth year]  SLP Diagnosis: Cognitive Impairments  Rehab Potential: Good ELOS: 10-12 days    Today's Date: 06/11/2021 SLP Individual Time: 3557-3220 SLP Individual Time Calculation (min): 55 min   Hospital Problem: Principal Problem:   CVA (cerebral vascular accident) ([place]) Active Problems:   Acute ischemic stroke ([place]) patchy L frontal infarcts s/p tPA   CKD (chronic kidney disease), stage IIIa   Occlusion of carotid stent, subsequent encounter  Past Medical History:  Past Medical History:  Diagnosis Date   Acute ischemic stroke ([place]) patchy L frontal infarcts s/p tPA 11/14/2019   Asthma    CKD (chronic kidney disease), stage IIIb 11/16/2019   Hyperlipidemia 11/16/2019   Hypertension    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   Palpitations 11/16/2019   Stroke ([place]) 11/14/2019   Thyroid nodule, L 11/16/2019   Past Surgical History:  Past Surgical History:  Procedure Laterality Date   HIP SURGERY     NECK SURGERY     TRANSCAROTID ARTERY REVASCULARIZATION  Left 06/08/2021   Procedure: LEFT TRANSCAROTID ARTERY REVASCULARIZATION;  Surgeon: Waynetta Sandy, MD;  Location: [facility];  Service: Vascular;  Laterality: Left;   ULTRASOUND GUIDANCE FOR VASCULAR ACCESS Right 06/08/2021   Procedure: ULTRASOUND GUIDANCE FOR VASCULAR ACCESS, RIGHT FEMORAL VEIN;  Surgeon: Waynetta Sandy, MD;  Location: [facility] OR;  Service: Vascular;  Laterality: Right;    Assessment / Plan / Recommendation Clinical Impression   Patient is a 75 y.o. year old non-English-speaking right-handed female.  Her first language is TAGALOG.History of hearing loss, asthma, CKD stage III, hypertension as well as patchy infarct posterior left frontal region status post tPA/loop recorder with right side residual weakness maintained on full-strength aspirin and received CIR 11/16/2019 to  12/04/2019 and discharged to home ambulating with a rolling walker close supervision.  Per chart review patient lives with her daughter and son-in-law two-level home bed and bath main level.  Daughter works with a Community education officer at [employer].  Family works split shifts and can provide assistance as needed.  Presented 06/04/2021 with right side numbness and increasing weakness.  CT of the head showed chronic ischemic microangiopathy and generalized atrophy without acute abnormality.  CT angiogram head and neck showed aortic atherosclerosis and left ICA mixed plaque but no significant stenosis.  There was a 2 cm left thyroid nodule recommend thyroid ultrasound as outpatient.  MRI identified patchy acute ischemic infarct involving the left parieto-occipital region associated petechial hemorrhage without frank intraparenchymal hematoma.  No significant mass-effect.  Vascular surgery consulted in regards to carotid atherosclerosis due to the fact patient was symptomatic underwent transcarotid placement and stenting 06/08/2021 per Dr. Donzetta Matters.  Patient is currently maintained on aspirin 325 mg daily as well as Plavix 75 mg daily for CVA prophylaxis.  Tolerating a regular consistency diet.  Therapy evaluations completed due to patient decreased functional mobility right side weakness was admitted for a comprehensive rehab program.   Patient transferred to CIR on 06/10/2021 .     Skilled Therapeutic Interventions          Pt was seen for cognitive-communication evaluation in room. Pt was up in wheelchair, passed off from PT. Pt appeared to be fatigued, but was agreeable to complete SLP evaluation. Initially, pt declined translator; however, appeared to have difficulty with initial questions on assessment. SLP explained that it may be beneficial to hear questions in primary and secondary  languages - pt agreed to using STRATUS.  Auditory Comprehension was observed to be impaired; however, SLP suspects this is due to  a combination of hearing loss and language barrier. Pt appeared to follow directions and answer all questions from translator without any difficulty.   Expressive language was noted to be [facility] for all tasks assessed.    Cognition was noted to be impaired. Pt was assessed using SLUMS. Pt scored a 4/30 with indications for impairments in the following areas: orientation, problem solving, memory, and executive functioning skills (planning, organization etc.). Throughout assessment, pt would appear to be thinking as indicated by a ~5 sec pause and then answer "I don't know". On STM task, given semantic cues, pt was able to recall 4/5 objects. Pt was unable to recall any of the questions from paragraph with or without cueing from SLP. She was unable to complete the clock drawing, but did place 12 and 6 respectively from top to bottom. She was unable to complete any further. She also demonstrated difficulty with identifying the triangle on assessment, reporting "there is no triangle". When asked about medication/financial management, pt reported her daughter was responsible. Sustained attention appeared to be [facility].  SLP rec skilled speech services to address cognitive-communication impairment. Pt was left in bed, with bed alarm activated. All items within reach.      SLP Assessment  Patient will need skilled Speech Lanaguage Pathology Services during CIR admission    Recommendations  SLP Diet Recommendations: Thin Liquid Administration via: Straw Medication Administration: Whole meds with liquid Supervision: Patient able to self feed Postural Changes and/or Swallow Maneuvers: Seated upright 90 degrees;Upright 30-60 min after meal Oral Care Recommendations: Oral care BID Patient destination: Home Follow up Recommendations: Outpatient SLP Equipment Recommended: None recommended by SLP    SLP Frequency 3 to 5 out of 7 days   SLP Duration  SLP Intensity  SLP Treatment/Interventions 10-12  days  Minumum of 1-2 x/day, 30 to 90 minutes  Cognitive remediation/compensation;Environmental controls;Internal/external aids;Multimodal communication approach;Therapeutic Exercise;Cueing hierarchy;Functional tasks;Patient/family education;Therapeutic Activities    Pain Pain Assessment Pain Scale: 0-10 Pain Score: 0-No pain Faces Pain Scale: No hurt Multiple Pain Sites: No  Prior Functioning Cognitive/Linguistic Baseline: Baseline deficits Baseline deficit details: complex problem solving and short term memory Type of Home: House  Lives With: Daughter Available Help at Discharge: Family;Available 24 hours/day Vocation: Retired  Programmer, systems Overall Cognitive Status: No family/caregiver present to determine baseline cognitive functioning Arousal/Alertness: Awake/alert Orientation Level: Oriented to person;Disoriented to place;Oriented to time Day of Week: Incorrect Attention: Sustained Sustained Attention: Appears intact Memory: Impaired Memory Impairment: Decreased short term memory Decreased Short Term Memory: Verbal basic Awareness: Appears intact Problem Solving: Impaired Problem Solving Impairment: Verbal complex Executive Function: Sequencing;Organizing;Self Correcting Sequencing: Impaired Sequencing Impairment: Verbal complex;Verbal basic Organizing: Impaired Organizing Impairment: Verbal basic;Verbal complex Self Correcting: Impaired Self Correcting Impairment: Verbal basic;Verbal complex Safety/Judgment: Appears intact [employer] Scales of Cognitive Functioning: Confused/appropriate  Comprehension Auditory Comprehension Overall Auditory Comprehension: Appears within functional limits for tasks assessed (Requires repetition - suspect this is due to hearing impairment) Yes/No Questions: Within Functional Limits Commands: Within Functional Limits Interfering Components: Attention;Hearing;Processing speed;Working Field seismologist:  Extra processing time;Increased volume;Pausing;Repetition;Visual/Gestural cues Visual Recognition/Discrimination Discrimination: Not tested Reading Comprehension Reading Status: Not tested Expression Expression Primary Mode of Expression: Verbal Verbal Expression Overall Verbal Expression: Appears within functional limits for tasks assessed (Conversed minimally) Level of Generative/Spontaneous Verbalization: Conversation Repetition: No impairment Naming: No impairment Written Expression Dominant Hand: Right Written Expression:  Not tested Oral Motor    Care Tool Care Tool Cognition Ability to hear (with hearing aid or hearing appliances if normally used Ability to hear (with hearing aid or hearing appliances if normally used): 2. Moderate difficulty - speaker has to increase volume and speak distinctly   Expression of Ideas and Wants Expression of Ideas and Wants: 3. Some difficulty - exhibits some difficulty with expressing needs and ideas (e.g, some words or finishing thoughts) or speech is not clear   Understanding Verbal and Non-Verbal Content Understanding Verbal and Non-Verbal Content: 2. Sometimes understands - understands only basic conversations or simple, direct phrases. Frequently requires cues to understand  Memory/Recall Ability Memory/Recall Ability : That he or she is in a hospital/hospital unit;Current season    Short Term Goals: Week 1: SLP Short Term Goal 1 (Week 1): Patient will demonstrate orientation (to place) given min verbal and visual cues. SLP Short Term Goal 2 (Week 1): Patient will complete mildly complex problem solving given modA verbal cues. SLP Short Term Goal 3 (Week 1): Patient will demonstrate recall of novel information with mod verbal cues with use of compensatory memory strategies  Refer to Care Plan for Long Term Goals  Recommendations for other services: None   Discharge Criteria: Patient will be discharged from SLP if patient refuses  treatment 3 consecutive times without medical reason, if treatment goals not met, if there is a change in medical status, if patient makes no progress towards goals or if patient is discharged from hospital.  The above assessment, treatment plan, treatment alternatives and goals were discussed and mutually agreed upon: by patient. No family available.   [employer] MS, [place], CBIS  06/11/2021, 3:14 PM

## 2021-06-12 ENCOUNTER — Ambulatory Visit (INDEPENDENT_AMBULATORY_CARE_PROVIDER_SITE_OTHER): Payer: Medicare Other

## 2021-06-12 DIAGNOSIS — I63412 Cerebral infarction due to embolism of left middle cerebral artery: Secondary | ICD-10-CM | POA: Diagnosis not present

## 2021-06-12 DIAGNOSIS — D62 Acute posthemorrhagic anemia: Secondary | ICD-10-CM | POA: Diagnosis not present

## 2021-06-12 DIAGNOSIS — I63232 Cerebral infarction due to unspecified occlusion or stenosis of left carotid arteries: Secondary | ICD-10-CM | POA: Diagnosis not present

## 2021-06-12 DIAGNOSIS — N1831 Chronic kidney disease, stage 3a: Secondary | ICD-10-CM | POA: Diagnosis not present

## 2021-06-12 LAB — CBC WITH DIFFERENTIAL/PLATELET
Abs Immature Granulocytes: 0.07 10*3/uL (ref 0.00–0.07)
Basophils Absolute: 0 10*3/uL (ref 0.0–0.1)
Basophils Relative: 0 %
Eosinophils Absolute: 0.4 10*3/uL (ref 0.0–0.5)
Eosinophils Relative: 4 %
HCT: 26.8 % — ABNORMAL LOW (ref 36.0–46.0)
Hemoglobin: 9 g/dL — ABNORMAL LOW (ref 12.0–15.0)
Immature Granulocytes: 1 %
Lymphocytes Relative: 28 %
Lymphs Abs: 2.5 10*3/uL (ref 0.7–4.0)
MCH: 30.8 pg (ref 26.0–34.0)
MCHC: 33.6 g/dL (ref 30.0–36.0)
MCV: 91.8 fL (ref 80.0–100.0)
Monocytes Absolute: 0.9 10*3/uL (ref 0.1–1.0)
Monocytes Relative: 10 %
Neutro Abs: 5 10*3/uL (ref 1.7–7.7)
Neutrophils Relative %: 57 %
Platelets: 302 10*3/uL (ref 150–400)
RBC: 2.92 MIL/uL — ABNORMAL LOW (ref 3.87–5.11)
RDW: 12.9 % (ref 11.5–15.5)
WBC: 8.9 10*3/uL (ref 4.0–10.5)
nRBC: 0 % (ref 0.0–0.2)

## 2021-06-12 LAB — COMPREHENSIVE METABOLIC PANEL
ALT: 21 U/L (ref 0–44)
AST: 25 U/L (ref 15–41)
Albumin: 2.8 g/dL — ABNORMAL LOW (ref 3.5–5.0)
Alkaline Phosphatase: 38 U/L (ref 38–126)
Anion gap: 5 (ref 5–15)
BUN: 18 mg/dL (ref 8–23)
CO2: 27 mmol/L (ref 22–32)
Calcium: 8.4 mg/dL — ABNORMAL LOW (ref 8.9–10.3)
Chloride: 105 mmol/L (ref 98–111)
Creatinine, Ser: 1.03 mg/dL — ABNORMAL HIGH (ref 0.44–1.00)
GFR, Estimated: 57 mL/min — ABNORMAL LOW (ref >60)
Glucose, Bld: 108 mg/dL — ABNORMAL HIGH (ref 70–99)
Potassium: 3.3 mmol/L — ABNORMAL LOW (ref 3.5–5.1)
Sodium: 137 mmol/L (ref 135–145)
Total Bilirubin: 1.5 mg/dL — ABNORMAL HIGH (ref 0.3–1.2)
Total Protein: 5.7 g/dL — ABNORMAL LOW (ref 6.5–8.1)

## 2021-06-12 LAB — CUP PACEART REMOTE DEVICE CHECK
Date Time Interrogation Session: 20220820230806
Implantable Pulse Generator Implant Date: 20210405

## 2021-06-12 LAB — URINE CULTURE: Culture: NO GROWTH

## 2021-06-12 MED ORDER — FERROUS SULFATE 220 (44 FE) MG/5ML PO ELIX
220.0000 mg | ORAL_SOLUTION | Freq: Every day | ORAL | Status: DC
  Administered 2021-06-13 – 2021-06-24 (×12): 220 mg via ORAL
  Filled 2021-06-12 (×12): qty 5

## 2021-06-12 MED ORDER — POTASSIUM CHLORIDE CRYS ER 20 MEQ PO TBCR
20.0000 meq | EXTENDED_RELEASE_TABLET | Freq: Every day | ORAL | Status: DC
  Administered 2021-06-12 – 2021-06-24 (×13): 20 meq via ORAL
  Filled 2021-06-12 (×13): qty 1

## 2021-06-12 NOTE — Progress Notes (Signed)
Speech Language Pathology Daily Session Note  Patient Details  Name: Denise Leon MRN: [id number] Date of Birth: Dec 21, 1945  Today's Date: 06/12/2021 SLP Individual Time: 7591-6384 SLP Individual Time Calculation (min): 31 min  Short Term Goals: Week 1: SLP Short Term Goal 1 (Week 1): Patient will demonstrate orientation (to place) given min verbal and visual cues. SLP Short Term Goal 2 (Week 1): Patient will complete mildly complex problem solving given modA verbal cues. SLP Short Term Goal 3 (Week 1): Patient will demonstrate recall of novel information with mod verbal cues with use of compensatory memory strategies  Skilled Therapeutic Interventions: Patient agreeable to skilled ST intervention with focus on cognitive goals. SLP facilitated session by providing overall mod A for working memory, and max A for short-term recall of <2 minute duration by visually recalling the sequential order of 4 novel items. Patient requested to use the bathroom at end of session in which she ambulated to bathroom with sup A for safety awareness. Patient was left in bed  with alarm activated and immediate needs within reach at end of session. Continue per current plan of care.      Pain Pain Assessment Pain Scale: 0-10 Pain Score: 0-No pain  Therapy/Group: Individual Therapy  Tamala Ser 06/12/2021, 3:49 PM

## 2021-06-12 NOTE — Progress Notes (Signed)
Physical Therapy Session Note  Patient Details  Name: Denise Leon MRN: [id number] Date of Birth: [birth year]  Today's Date: 06/12/2021 PT Individual Time: 0930-1001 and 1110-1200 PT Individual Time Calculation (min): 31 min and50 min. Missed tim of 10 min. Short Term Goals: Week 1:  PT Short Term Goal 1 (Week 1): Patient will perform bed mobility with supervision. PT Short Term Goal 2 (Week 1): Patient will perform basic transfers with supervision using LRAD. PT Short Term Goal 3 (Week 1): Patient will ambulate >100 feet with CGA using LRAD.  Skilled Therapeutic Interventions/Progress Updates:   First session:  Pt presents supine in bed and agrees to participate w/ therapy.  Pt transfers sup to sit w/ min A although will reach for external support.  Pt sat EOB and pants threaded over feet.  Pt pulled pants over feet and then transfers sit to stand w/ min A and required mod A to complete donning of pants.  Pt amb x 4' w/ RW and min A to w/c, verbal cues for posture and foot clearance.  Pt transfers sit to stand multiple times at sink to brush hair and teeth.  Pt requires verbal and visual cues for hand placement.  Pt required mod A to don scrub top and doff gown.  Pt transferred w/c > BSC, w/ min A and mod A for clothing management, continent of urine in commode, supervision for pericare, NT to chart.  Pt the transferred to recliner w/ min A and verbal cues and remained in recliner w/ seat alarm on and all needs in reach.  Second session:  Pt presents in recliner and c/o fatigue.  PT attempted to rearrange schedule, but unable.  Pt did agree to therapy.  Pt performed LE there ex of 2 x 10 LAQ, hip flexion and isometric add.  Pt wheeled to main gym for time and energy conservation.  Pt transferred multiple trials sit to stand w/ min to CGA, but visual cues for hand placement.  Pt performed toe-taps to large purple cone, but states pain in R leg.  Pt performed standing hooking horseshoes over lower  hoop, using LUE crossing midline and then right hand to pick up shoe, but L hand to hook.  Pt then reversed procedure after seated rest.  Pt amb x 65' w/ RW and min A, verbal cues for step length and foot clearance, esp w/ turns and safe approach to seat.  Pt amb x 40' into room and to bed, but then requests use of BSC.  Pt transfers bed <> BSC w/ min A, mod A for clothing management.  Pt supervision for pericare.  Pt transferred sit to supine w/ min A for RLE only.  Bed alarm on and all needs in reach.     Therapy Documentation Precautions:  Precautions Precautions: Fall Precaution Comments: HoH better hearing in her Rt ear, speaks Tagalog and some English Restrictions Weight Bearing Restrictions: No General:   Vital Signs:  Pain:0/10 Pain Assessment Pain Scale: 0-10 Pain Score: 0-No pain    Therapy/Group: Individual Therapy  Lucio Edward 06/12/2021, 10:57 AM

## 2021-06-12 NOTE — Progress Notes (Signed)
Occupational Therapy Note  Patient Details  Name: Denise Leon MRN: [id number] Date of Birth: 12/06/1945  Today's Date: 06/12/2021 OT Missed Time: 30 Minutes Missed Time Reason: Other (comment) (scheduling error)  Pt scheduled for OT at 830am but due to scheduling error unable to be seen at that time. Spoke with pt later in morning and told her I would try to see her after lunch, but at 1315 pt still eating lunch.  Will see pt for 30 minutes later this week.    Tannor Pyon 06/12/2021, 1:18 PM

## 2021-06-12 NOTE — Progress Notes (Signed)
PROGRESS NOTE   Subjective/Complaints: Doing fairly well. Neck still sore. Ready for therapy today. Commented that her right side weak and numb, particularly UE.  ROS: limited due to language/communication    Objective:   No results found. Recent Labs    06/10/21 0246 06/12/21 0558  WBC 10.6* 8.9  HGB 9.3* 9.0*  HCT 27.5* 26.8*  PLT 240 302   Recent Labs    06/10/21 0246 06/12/21 0558  NA 139 137  K 3.8 3.3*  CL 108 105  CO2 25 27  GLUCOSE 109* 108*  BUN 26* 18  CREATININE 1.32* 1.03*  CALCIUM 8.2* 8.4*    Intake/Output Summary (Last 24 hours) at 06/12/2021 2706 Last data filed at 06/11/2021 1630 Gross per 24 hour  Intake 120 ml  Output 575 ml  Net -455 ml        Physical Exam: Vital Signs Blood pressure (!) 164/75, pulse 79, temperature 99.7 F (37.6 C), temperature source Oral, resp. rate 18, height 5\' 4"  (1.626 m), weight 65.2 kg, SpO2 96 %.  General: Alert and oriented x 3, No apparent distress HEENT: Head is normocephalic, atraumatic, PERRLA, EOMI, sclera anicteric, oral mucosa pink and moist, dentition intact, ext ear canals clear,  Neck: Supple without JVD or lymphadenopathy Heart: Reg rate and rhythm. No murmurs rubs or gallops Chest: CTA bilaterally without wheezes, rales, or rhonchi; no distress Abdomen: Soft, non-tender, non-distended, bowel sounds positive. Extremities: No clubbing, cyanosis, or edema. Pulses are 2+ Psych: Pt's affect is appropriate. Pt is cooperative Skin: left neck incision with scab, granulation, swelling decreased, area sl tender Neuro:  Alert and oriented to person, place. Right central 7, HOH, speech sl dysarthric. Follows commands, fair insight and awareness. Some word finding deficits. RUE 3-4/5 prox to 3 to 3+ distally. RLE 4-/5 prox to 4/5 distally Musculoskeletal: normal PROM, posture fair   Assessment/Plan: 1. Functional deficits which require 3+ hours per day  of interdisciplinary therapy in a comprehensive inpatient rehab setting. Physiatrist is providing close team supervision and 24 hour management of active medical problems listed below. Physiatrist and rehab team continue to assess barriers to discharge/monitor patient progress toward functional and medical goals  Care Tool:  Bathing    Body parts bathed by patient: Chest, Abdomen, Front perineal area, Right upper leg, Left upper leg, Face   Body parts bathed by helper: Right arm, Left arm, Buttocks, Right lower leg, Left lower leg     Bathing assist Assist Level: Moderate Assistance - Patient 50 - 74%     Upper Body Dressing/Undressing Upper body dressing   What is the patient wearing?: Pull over shirt    Upper body assist Assist Level: Moderate Assistance - Patient 50 - 74%    Lower Body Dressing/Undressing Lower body dressing      What is the patient wearing?: Pants, Underwear/pull up     Lower body assist Assist for lower body dressing: Maximal Assistance - Patient 25 - 49%     Toileting Toileting    Toileting assist Assist for toileting: Moderate Assistance - Patient 50 - 74%     Transfers Chair/bed transfer  Transfers assist     Chair/bed transfer assist level:  Minimal Assistance - Patient > 75%     Locomotion Ambulation   Ambulation assist      Assist level: Minimal Assistance - Patient > 75% Assistive device: Walker-rolling Max distance: 55 ft   Walk 10 feet activity   Assist     Assist level: Minimal Assistance - Patient > 75% Assistive device: Walker-rolling   Walk 50 feet activity   Assist    Assist level: Minimal Assistance - Patient > 75% Assistive device: Walker-rolling    Walk 150 feet activity   Assist Walk 150 feet activity did not occur: Safety/medical concerns         Walk 10 feet on uneven surface  activity   Assist Walk 10 feet on uneven surfaces activity did not occur: Safety/medical concerns          Wheelchair     Assist   Type of Wheelchair: Manual    Wheelchair assist level: Total Assistance - Patient < 25% Max wheelchair distance: 150 ft    Wheelchair 50 feet with 2 turns activity    Assist        Assist Level: Total Assistance - Patient < 25%   Wheelchair 150 feet activity     Assist      Assist Level: Total Assistance - Patient < 25%   Blood pressure (!) 164/75, pulse 79, temperature 99.7 F (37.6 C), temperature source Oral, resp. rate 18, height 5\' 4"  (1.626 m), weight 65.2 kg, SpO2 96 %.  Medical Problem List and Plan: 1.  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  Antithrombotics: -DVT/anticoagulation:  Mechanical: Sequential compression devices, below knee Bilateral lower extremities             -antiplatelet therapy: aspirin 325 mg daily and Plavix 75 mg daily 3. Pain Management: Oxycodone as needed 4. Mood: Cymbalta 30 mg daily             -antipsychotic agents: N/A 5. Neuropsych: This patient is capable of making decisions on her own behalf. 6. Skin/Wound Care: Routine skin checks 7. Fluids/Electrolytes/Nutrition: encourage PO  -8/29 Hypokalemia 3.3--> supplement with kdur 9/29 daily 8.  Hyperlipidemia.  Lipitor 9.  CKD stage III.Creatinine baseline 1.10-1.22---stable 8/29 10.  Incidental 2 cm left thyroid nodule.  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 Will also start hydralazine 25 mg q8 hours prn for  SBP >180 and DBP >100.   8/29 bp in range 12. Urinary frequency-  U/A negative. and Cx (was (-)--monitor patterns 13. Constipation-   Sorbitol  60cc x1 yesterday with results 14. ABLA: hgb trending down post-op  -iron supplement  -recheck Wednesday  -surgical site appears improved    LOS: 2 days A FACE TO FACE EVALUATION WAS PERFORMED  9/29 06/12/2021, 9:22 AM

## 2021-06-12 NOTE — Progress Notes (Signed)
Inpatient Rehabilitation Center Individual Statement of Services  Patient Name:  Denise Leon  Date:  06/12/2021  Welcome to the Inpatient Rehabilitation Center.  Our goal is to provide you with an individualized program based on your diagnosis and situation, designed to meet your specific needs.  With this comprehensive rehabilitation program, you will be expected to participate in at least 3 hours of rehabilitation therapies Monday-Friday, with modified therapy programming on the weekends.  Your rehabilitation program will include the following services:  Physical Therapy (PT), Occupational Therapy (OT), Speech Therapy (ST), 24 hour per day rehabilitation nursing, Therapeutic Recreaction (TR), Neuropsychology, Care Coordinator, Rehabilitation Medicine, Nutrition Services, Pharmacy Services, and Other  Weekly team conferences will be held on Wednesdays  to discuss your progress.  Your Inpatient Rehabilitation Care Coordinator will talk with you frequently to get your input and to update you on team discussions.  Team conferences with you and your family in attendance may also be held.  Expected length of stay:  10- 12 Days  Overall anticipated outcome: Supervision   Depending on your progress and recovery, your program may change. Your Inpatient Rehabilitation Care Coordinator will coordinate services and will keep you informed of any changes. Your Inpatient Rehabilitation Care Coordinator's name and contact numbers are listed  below.  The following services may also be recommended but are not provided by the Inpatient Rehabilitation Center:   Home Health Rehabiltiation Services Outpatient Rehabilitation Services    Arrangements will be made to provide these services after discharge if needed.  Arrangements include referral to agencies that provide these services.  Your insurance has been verified to be:  Medicare A & B Your primary doctor is:  NO PCP   Pertinent information will be shared  with your doctor and your insurance company.  Inpatient Rehabilitation Care Coordinator:  Lavera Guise, [place] [phone] or [phone]  Information discussed with and copy given to patient by: [name], 06/12/2021, 11:46 AM

## 2021-06-12 NOTE — Progress Notes (Signed)
Speech Language Pathology Daily Session Note  Patient Details  Name: Denise Leon MRN: [id number] Date of Birth: 27-Jul-1946  Today's Date: 06/12/2021 SLP Individual Time: 1031-1102 SLP Individual Time Calculation (min): 31 min  Short Term Goals: Week 1: SLP Short Term Goal 1 (Week 1): Patient will demonstrate orientation (to place) given min verbal and visual cues. SLP Short Term Goal 2 (Week 1): Patient will complete mildly complex problem solving given modA verbal cues. SLP Short Term Goal 3 (Week 1): Patient will demonstrate recall of novel information with mod verbal cues with use of compensatory memory strategies  Skilled Therapeutic Interventions:  Pt seen for skilled ST with focus on cognitive goals. Pt can communicate in basic English, however is HOH and benefits from [place] interpretor for more complex communication needs. Pt agreeable to further cognitive assessment via portions of the ALFA: "Telling Time" - 50%, "Using a Calendar" - 60%. Attempted Counting Money, Solving Daily Math Problems and Addressing an Envelope, however pt not able or willing to complete for various reasons (language barrier, disinterest, affected R hand, etc). Pt will benefit from ongoing cognitive assessment to determine current function, daughter will be helpful in corroborating baseline needs/deficits. Pt left in recliner with alarm set and all needs met via translator service. Cont ST POC.    Pain Pain Assessment Pain Scale: 0-10 Pain Score: 0-No pain  Therapy/Group: Individual Therapy  Dewaine Conger 06/12/2021, 10:56 AM

## 2021-06-12 NOTE — Progress Notes (Signed)
Inpatient Rehabilitation  Patient information reviewed and entered into eRehab system by Daymen Hassebrock M. Berk Pilot, M.A., CCC/SLP, PPS Coordinator.  Information including medical coding, functional ability and quality indicators will be reviewed and updated through discharge.    

## 2021-06-12 NOTE — Progress Notes (Addendum)
Patient ID: Denise Leon, female   DOB: [birth year], 75 y.o.   MRN: [id number] Met with the patient to introduce self and review role of the nurse CM. Discussed secondary stroke risks. Patient asked if information could be reviewed with her daughter scheduled to come in later today. Notebook at bedside with resources. Continue to follow along to discharge to address educational needs. Patient reports she does not need an interpreter however recommend interpreter if reviewing other than basic need information. Hervey Ard, Dalbert Batman

## 2021-06-12 NOTE — Progress Notes (Addendum)
Denise Leon [facility] 06/11/2021 10:44   Inpatient Rehabilitation Medication Review by a Pharmacist   A complete drug regimen review was completed for this patient to identify any potential clinically significant medication issues.   High Risk Drug Classes Is patient taking? Indication by Medication  Antipsychotic No    Anticoagulant No    Antibiotic No    Opioid Yes Oxycodone, moderate pain  Antiplatelet Yes Aspirin and clopidogrel, secondary prevention  Hypoglycemics/insulin No    Vasoactive Medication No Amlodipine 5 mg daily, carvedilol 6.25 mg BID, atorvastatin 80 mg   Chemotherapy No    Other No Vitamin A 2400 mcg/day, vitamin c 500 mg/day Duloxetine 30 mg        Type of Medication Issue Identified Description of Issue Recommendation(s)  Drug Interaction(s) (clinically significant)        Duplicate Therapy        Allergy        No Medication Administration End Date        Incorrect Dose        Additional Drug Therapy Needed        Significant med changes from prior encounter (inform family/care partners about these prior to discharge).      Other            Clinically significant medication issues were identified that warrant physician communication and completion of prescribed/recommended actions by midnight of the next day:  Yes, amlodipine and carvedilol not restarted   Name of provider notified for urgent issues identified: [facility]   Provider Method of Notification: Phone       Pharmacist comments:    Time spent performing this drug regimen review (minutes):  20     [name] 06/11/2021 7:03 AM   8/29 15:00 pm -- Carvedilol, amlodipine, and duloxetine have been resumed. Thank you Okey Regal, PharmD

## 2021-06-13 DIAGNOSIS — I63232 Cerebral infarction due to unspecified occlusion or stenosis of left carotid arteries: Secondary | ICD-10-CM | POA: Diagnosis not present

## 2021-06-13 NOTE — Progress Notes (Signed)
Occupational Therapy Session Note  Patient Details  Name: Denise Leon MRN: [id number] Date of Birth: 1946/06/18  Today's Date: 06/13/2021 OT Individual Time: 0729-0809 OT Individual Time Calculation (min): 40 min    Short Term Goals: Week 1:  OT Short Term Goal 1 (Week 1): Pt will complete LB dressing with Mod A OT Short Term Goal 2 (Week 1): Pt will complete ambulatory toilet transfer using RW with no more than CGA OT Short Term Goal 3 (Week 1): Pt will complete a shower transfer with CGA using LRAD   Skilled Therapeutic Interventions/Progress Updates:    Pt greeted at time of session sitting up in reclined agreeable to OT session, no pain at rest but did have pain in RUE when trying to functionally use during ADL, rest breaks PRN and physical assist when needed. Set up in recliner at sink for oral hygiene and face washing with Min A for facilitation at R elbow (pt stating she is R handed) and has good use of LUE. Pt needing to toilet at this time and saying walking to bathroom was "too far" stand pivot recliner <> BSC with Min/CGA and RW. 3/3 toileting tasks with Min A for clothing management only. Provided sponge for hand strengthening and performed 1x15 for practice. Given facial gestures of pain while using RUE, therapist performed 1x10 of the following AAROM: shoulder flex/extension, shoulder circles, elbow flex/ext, forearm sup/pro, wrist flex/ext. Note communicating via gestures and english throughout as pt stated at beginning of session that english is "fine" for the session. Up in chair alarm on call bell in reach.   Therapy Documentation Precautions:  Precautions Precautions: Fall Precaution Comments: [facility] better hearing in her Rt ear, speaks Tagalog and some English Restrictions Weight Bearing Restrictions: No    Therapy/Group: Individual Therapy  Erasmo Score 06/13/2021, 7:13 AM

## 2021-06-13 NOTE — IPOC Note (Signed)
Overall Plan of Care [facility]) Patient Details Name: Denise Leon MRN: [id number] DOB: 25-Aug-1946  Admitting Diagnosis: CVA (cerebral vascular accident) [facility])  Hospital Problems: Principal Problem:   CVA (cerebral vascular accident) (HCC) Active Problems:   Acute ischemic stroke (HCC) patchy L frontal infarcts s/p tPA   CKD (chronic kidney disease), stage IIIa   Occlusion of carotid stent, subsequent encounter     Functional Problem List: Nursing Safety, Skin Integrity, Medication Management, Endurance, Pain  PT Balance, Behavior, Edema, Endurance, Motor, Nutrition, Pain, Perception, Safety, Skin Integrity, Sensory  OT Balance, Cognition, Edema, Endurance, Motor, Safety, Vision  SLP Cognition  TR         Basic ADL's: OT Grooming, Bathing, Dressing, Toileting     Advanced  ADL's: OT Simple Meal Preparation     Transfers: PT Bed Mobility, Bed to Chair, Car, Occupational psychologist, Research scientist (life sciences): PT Ambulation, Psychologist, prison and probation services, Stairs     Additional Impairments: OT Fuctional Use of Upper Extremity  SLP Social Cognition   Problem Solving, Memory, Attention, Awareness  TR      Anticipated Outcomes Item Anticipated Outcome  Self Feeding No goal  Swallowing      Basic self-care  Supervision  Toileting  Supervision   Bathroom Transfers Supervision  Bowel/Bladder  To remain continent of bowel/bladder with mod I while in rehab  Transfers  mod I using LRAD  Locomotion  Supervision using LRAD >150 ft  Communication     Cognition  minA  Pain  <2  Safety/Judgment  able to call for help and follow safety plan   Therapy Plan: PT Intensity: Minimum of 1-2 x/day ,45 to 90 minutes PT Frequency: 5 out of 7 days PT Duration Estimated Length of Stay: 10-14 days OT Intensity: Minimum of 1-2 x/day, 45 to 90 minutes OT Frequency: 5 out of 7 days OT Duration/Estimated Length of Stay: 10-14 days SLP Intensity: Minumum of 1-2 x/day, 30 to 90 minutes SLP  Frequency: 3 to 5 out of 7 days SLP Duration/Estimated Length of Stay: 10-12 days   Due to the current state of emergency, patients may not be receiving their 3-hours of Medicare-mandated therapy.   Team Interventions: Nursing Interventions Patient/Family Education, Pain Management, Medication Management, Skin Care/Wound Management, Disease Management/Prevention, Discharge Planning  PT interventions Ambulation/gait training, Discharge planning, Functional mobility training, Psychosocial support, Therapeutic Activities, Visual/perceptual remediation/compensation, Wheelchair propulsion/positioning, Therapeutic Exercise, Skin care/wound management, Neuromuscular re-education, Disease management/prevention, Warden/ranger, Cognitive remediation/compensation, DME/adaptive equipment instruction, Pain management, Splinting/orthotics, UE/LE Strength taining/ROM, UE/LE Coordination activities, Stair training, Patient/family education, Functional electrical stimulation, Community reintegration  OT Interventions Warden/ranger, Disease mangement/prevention, Neuromuscular re-education, Self Care/advanced ADL retraining, Therapeutic Exercise, Cognitive remediation/compensation, Pain management, DME/adaptive equipment instruction, Skin care/wound managment, UE/LE Strength taining/ROM, UE/LE Coordination activities, Patient/family education, Community reintegration, Functional electrical stimulation, Discharge planning, Functional mobility training, Psychosocial support, Therapeutic Activities, Visual/perceptual remediation/compensation  SLP Interventions Cognitive remediation/compensation, Environmental controls, Internal/external aids, Multimodal communication approach, Therapeutic Exercise, Cueing hierarchy, Functional tasks, Patient/family education, Therapeutic Activities  TR Interventions    SW/CM Interventions Discharge Planning, Psychosocial Support, Patient/Family Education, Disease  Management/Prevention   Barriers to Discharge MD  Medical stability  Nursing      PT Neurogenic Bowel & Bladder, Home environment access/layout, Behavior    OT Lack of/limited family support    SLP      SW       Team Discharge Planning: Destination: PT-Home ,OT- Home , SLP-Home Projected Follow-up: PT-Outpatient PT, OT-  Home health OT, SLP-Outpatient  SLP Projected Equipment Needs: PT- , OT- To be determined, SLP-None recommended by SLP Equipment Details: PT-patient reports that she has RW, need to confirm with family due to cognitive deficits, OT-  Patient/family involved in discharge planning: PT- Patient unable/family or caregiver not available,  OT-Patient, SLP-Patient  MD ELOS: 10-14 days minA Medical Rehab Prognosis:  Excellent Assessment: Denise Leon is admitted to CIR with  increasing right side weakness secondary to left parietal occipital 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 Medications are being managed, and labs and vitals are being monitored regularly.     See Team Conference Notes for weekly updates to the plan of care

## 2021-06-13 NOTE — Progress Notes (Signed)
PMR Admission Coordinator Pre-Admission Assessment   Patient: Denise Leon is an 75 y.o., female MRN: [id number] DOB: 22-Jan-1946 Height: 5' 4"  (162.6 cm) Weight: 61.5 kg   Insurance Information HMO:     PPO:      PCP:      IPA:      80/20:      OTHER:  PRIMARY: Medicare a and b      Policy#: [id number]      Subscriber: pt Benefits:  Phone #: passport one source online     Name: 8/23 Eff. Date: A 11/16/2019 and B 08/16/2019     Deduct: $1556      Out of Pocket Max: none      Life Max: none CIR: 100%      SNF: 20 full days Outpatient: 80%     Co-Pay: 20% Home Health: 100%      Co-Pay: none DME: 80%     Co-Pay: 20% Providers: pt choice  SECONDARY: Medicaid of [place]      Policy#: [id number] m    8/23 active MAAQN per passport one source online   Financial Counselor:       Phone#:    The "Data Collection Information Summary" for patients in Inpatient Rehabilitation Facilities with attached "Privacy Act [place] Records" was provided and verbally reviewed with: Family   Emergency Contact Information Contact Information       Name Relation Home Work [place], [place] Daughter     [place], [place] Other     [phone]           Current Medical History  Patient Admitting Diagnosis: CVA   History of Present Illness:  75 year old non-English-speaking right-handed female.  Her first language is TAGALOG.History of hearing loss, asthma, CKD stage III, hypertension as well as patchy infarct posterior left frontal region status post TPA/loop recorder with right side residual weakness maintained on full-strength aspirin and received CIR 11/16/2019 to 12/04/2019 and discharged to home ambulating with a rolling walker close supervision.  Presented 06/04/2021 with right side numbness and increasing weakness.  CT of the head showed chronic ischemic microangiopathy and generalized atrophy without acute abnormality.  CT angiogram head and neck showed aortic  atherosclerosis and left ICA mixed plaque but no significant stenosis.  There was a 2 cm left thyroid nodule recommend thyroid ultrasound as outpatient.  MRI identified patchy acute ischemic infarct involving the left parieto-occipital region associated petechial hemorrhage without frank intraparenchymal hematoma.  No significant mass-effect.  Vascular surgery consulted in regards to carotid atherosclerosis due to the fact patient was symptomatic underwent trans carotid placement and stenting 06/08/2021 per Dr. Donzetta Matters.  Patient is currently maintained on aspirin 325 mg daily as well as Plavix 75 mg daily for CVA prophylaxis.  Tolerating a regular consistency diet.  Therapy evaluations completed due to patient decreased functional mobility right side weakness was admitted    Complete NIHSS TOTAL: 4   Patient's medical record from [facility] has been reviewed by the rehabilitation admission coordinator and physician.   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 No   Family History   family history includes Lung disease in her mother.   Current Medications   Current Facility-Administered Medications:    0.9 %  sodium chloride infusion, , Intravenous, Continuous, Baglia, Corrina, PA-C, Last Rate: 75 mL/hr at 06/10/21 0040, Infusion Verify at 06/10/21 0040   acetaminophen (TYLENOL) tablet 650 mg, 650 mg, Oral, Q4H PRN, 650 mg at 06/09/21 1403 **OR** acetaminophen (TYLENOL) 160 MG/5ML solution 650 mg, 650 mg, Per Tube, Q4H PRN **OR** acetaminophen (TYLENOL) suppository 650 mg, 650 mg,  Rectal, Q4H PRN, Baglia, Corrina, PA-C   acetaminophen (TYLENOL) tablet 650 mg, 650 mg, Oral, TID AC & HS, Lavina Hamman, MD, 650 mg at 06/10/21 0858   alum & mag hydroxide-simeth (MAALOX/MYLANTA) 200-200-20 MG/5ML suspension 15-30 mL, 15-30 mL, Oral, Q2H PRN, Baglia, Corrina, PA-C   aspirin suppository 300 mg, 300 mg, Rectal, Daily **OR** aspirin tablet 325 mg, 325 mg, Oral, Daily, Baglia, Corrina, PA-C, 325 mg at 06/10/21 0858   atorvastatin (LIPITOR) tablet 80 mg, 80 mg, Oral, Daily, Baglia, Corrina, PA-C, 80 mg at 06/10/21 0859   clopidogrel (PLAVIX) tablet 75 mg, 75 mg, Oral, Daily, Baglia, Corrina, PA-C, 75 mg at 06/10/21 0858   docusate sodium (COLACE) capsule 100 mg, 100 mg, Oral, BID, Lavina Hamman, MD, 100 mg at 06/10/21 0859   DULoxetine (CYMBALTA) DR capsule 30 mg, 30 mg, Oral, Daily, Baglia, Corrina, PA-C, 30 mg at 06/10/21 0859   guaiFENesin-dextromethorphan (ROBITUSSIN DM) 100-10 MG/5ML syrup 15 mL, 15 mL, Oral, Q4H PRN, Baglia, Corrina, PA-C   hydrALAZINE (APRESOLINE) injection 10 mg, 10 mg, Intravenous, Q4H PRN, Baglia, Corrina, PA-C   HYDROmorphone (DILAUDID) injection 0.5-1 mg, 0.5-1 mg, Intravenous, Q2H PRN, Baglia, Corrina, PA-C, 1 mg at 06/08/21 1959   latanoprost (XALATAN) 0.005 % ophthalmic solution 1 drop, 1 drop, Both Eyes, QHS, Baglia, Corrina, PA-C, 1 drop at 06/09/21 2031   ondansetron (ZOFRAN) injection 4 mg, 4 mg, Intravenous, Q6H PRN, Baglia, Corrina, PA-C, 4 mg at 06/08/21 2324   oxyCODONE (Oxy IR/ROXICODONE) immediate release tablet 5 mg, 5 mg, Oral, Q4H PRN, Lavina Hamman, MD   pantoprazole (PROTONIX) EC tablet 40 mg, 40 mg, Oral, Daily, Baglia, Corrina, PA-C, 40 mg at 06/10/21 0858   phenol (CHLORASEPTIC) mouth spray 1 spray, 1 spray, Mouth/Throat, PRN, Baglia, Corrina, PA-C   senna-docusate (Senokot-S) tablet 1 tablet, 1 tablet, Oral, QHS PRN, Baglia, Corrina, PA-C   vitamin B-12 (CYANOCOBALAMIN) tablet 1,000 mcg, 1,000 mcg, Oral, Daily, Baglia, Corrina,  PA-C, 1,000 mcg at 06/10/21 0900   Patients Current Diet:  Diet Order                  Diet Heart Room service appropriate? Yes; Fluid consistency: Thin  Diet effective now                       Precautions / Restrictions Precautions Precautions: Fall Precaution Comments: HoH, speaks Tagalog and some English Restrictions Weight Bearing Restrictions: No    Has the patient had 2 or more falls or a fall with injury in the past year? No   Prior Activity Level Limited Community (1-2x/wk): Mod I with RW and adls   Prior Functional Level Self Care: Did the patient need help bathing, dressing, using the toilet or eating? Needed some help   Indoor Mobility: Did the patient need assistance with walking from  room to room (with or without device)? Independent   Stairs: Did the patient need assistance with internal or external stairs (with or without device)? Independent   Functional Cognition: Did the patient need help planning regular tasks such as shopping or remembering to take medications? Needed some help   Patient Information Are you of Hispanic, Latino/a,or Spanish origin?: A. No, not of Hispanic, Latino/a, or Spanish origin What is  your race?: F. Filipino Do you need an interpreter to communicate with a doctor or health care staff: 1. Yes   Patient's Response To:  Health Literacy and Transportation Is the patient able to respond to health literacy and transportation needs?: Yes Health Literacy-How often do you need to have someone help you when you read instructions, pamphlets, other written materials from your doctor or pharmacy?: Often In the past 12 months, has lack of transportation kept you from medical appointments or from getting medications?: No In the past 12 months, has lack of transportation kept you from meetings, work, or from getting things needed for daily living?: No    Development worker, international aid / [place] Devices/Equipment: None   Prior  Device Use: Indicate devices/aids used by the patient prior to current illness, exacerbation or injury? Walker   Current Functional Level Cognition   Arousal/Alertness: Awake/alert Overall Cognitive Status: No family/caregiver present to determine baseline cognitive functioning Orientation Level: Oriented to person, Oriented to situation, Disoriented to place, Disoriented to time General Comments: requires multimodal cueing. Patient having difficulty hearing Stratus Interpreter. Able to understand and respond appropriately to basic commands in [place]. Pt with noted short term memory deficit and asking "what happened to me?" about every 2-5 minutes (RN notified I'm not sure what her memory baseline was prior to [place]). Pt also c/o facial numbness on both sides and still has some R facial droop. Attention: Sustained Sustained Attention: Appears intact Awareness: Appears intact Problem Solving: Appears intact Safety/Judgment: Appears intact    Extremity Assessment (includes Sensation/Coordination)   Upper Extremity Assessment: RUE deficits/detail RUE Deficits / Details: Pt demonstrates ~55* shoulder flexion, mass grasp/release of right hand. pt reports she "has been like this" RUE Coordination: decreased fine motor, decreased gross motor LUE Deficits / Details: Per chart review, pt with h/o frozen shoulder.  Pt demonstrates ~60* Shoulder flexion.  Elbow distally grossly WFL  Lower Extremity Assessment: Defer to PT evaluation RLE Deficits / Details: AAROM hip/ knee and ankle, hip grossly 2/5, knee 3/5 and ankle3+/5 LLE Deficits / Details: AROM WFL, strength grossly 4/5     ADLs   Overall ADL's : Needs assistance/impaired Eating/Feeding: Minimal assistance, Bed level Grooming: Wash/dry hands, Wash/dry face, Oral care, Brushing hair, Minimal assistance, Standing Grooming Details (indicate cue type and reason): completed while standing at sink level Upper Body Bathing: Moderate assistance,  Sitting Lower Body Bathing: Moderate assistance, Sit to/from stand Upper Body Dressing : Moderate assistance, Sitting Lower Body Dressing: Moderate assistance, Sit to/from stand Lower Body Dressing Details (indicate cue type and reason): requires mod A to don socks EOB Toilet Transfer: Minimal assistance, RW Toilet Transfer Details (indicate cue type and reason): pt voided, provided BSC over toilet, pt with increased steadiness and control Toileting- Clothing Manipulation and Hygiene: Minimal assistance, Sit to/from stand Toileting - Clothing Manipulation Details (indicate cue type and reason): for assistance with mesh briefs Functional mobility during ADLs: Minimal assistance, Rolling walker General ADL Comments: cues for safe use of RW, stability, cues for sequencing.     Mobility   Overal bed mobility:  Needs Assistance Bed Mobility: Supine to Sit, Sit to Supine Supine to sit: Min assist Sit to supine: Supervision General bed mobility comments: cues for self-assist, use of bed rails, HOB elevated     Transfers   Overall transfer level: Needs assistance Equipment used: Rolling walker (2 wheeled) Transfers: Sit to/from Stand Sit to Stand: Min assist Stand pivot transfers: Min assist General transfer comment: minA to rise and steady. Unsteady upon standing     Ambulation / Gait / Stairs / Wheelchair Mobility   Ambulation/Gait Ambulation/Gait assistance: Herbalist (Feet): 18 Feet (x2 with seated break) Assistive device: Rolling walker (2 wheeled) Gait Pattern/deviations: Step-through pattern, Decreased stride length, Decreased weight shift to right, Decreased stance time - right, Trunk flexed General Gait Details: decreased stance time on R and short step length bilaterally. MinA for balance and RW management and needs manual assist at times to advance RLE when stepping. Up to [place] when turning and just prior to sitting due to posterior lean Gait velocity:  decreased Gait velocity interpretation: <1.31 ft/sec, indicative of household ambulator     Posture / Balance Balance Overall balance assessment: Needs assistance Sitting-balance support: Feet supported Sitting balance-Leahy Scale: Good Standing balance support: Bilateral upper extremity supported Standing balance-Leahy Scale: Poor Standing balance comment: Requiring UE and external support     Special needs/care consideration Tagalog interpreter needed HOH    Previous Home Environment  Living Arrangements: Children  Lives With: Daughter Available Help at Discharge: Family, Available 24 hours/day (daughter works days and son in law works nights so has 24/7 supervision) Type of Home: [employer] Layout: Two level, Able to live on main level with bedroom/bathroom Home Access: Level entry Bathroom Shower/Tub: Multimedia programmer: Standard Home Care Services: No Additional Comments: info gleaned from previous admission.  Per chart review and pt input.  She lives with her daughter and reports her daughter works as a PT at [employer] for Discharge Living Setting: Lives with (comment) (daughter and son in law) Type of Home at Discharge: House Discharge Home Layout: Two level, Able to live on main level with bedroom/bathroom Discharge Home Access: Level entry Discharge Bathroom Shower/Tub: Walk-in shower Discharge Bathroom Toilet: Standard Discharge Bathroom Accessibility: Yes How Accessible: Accessible via walker Does the patient have any problems obtaining your medications?: No   Social/Family/Support Systems Contact Information: daughter, [name], speaks English Anticipated Caregiver: daughter and son in law Anticipated Caregiver's Contact Information: see above Ability/Limitations of Caregiver: daughter works days Caregiver Availability: 24/7 Discharge Plan Discussed with Primary Caregiver: Yes Is Caregiver In Agreement with  Plan?: Yes Does Caregiver/Family have Issues with Lodging/Transportation while Pt is in Rehab?: No   Goals Patient/Family Goal for Rehab: supervision PT, supervision to min OT, supervision SLP Expected length of stay: ELOS 10 to 12 days Cultural Considerations: Tagalog interpreter; does speak ENglish but very [place] Pt/Family Agrees to Admission and willing to participate: Yes Program Orientation Provided & Reviewed with Pt/Caregiver Including Roles  & Responsibilities: Yes   Decrease burden of Care through IP rehab admission: n/a   Possible need for SNF placement upon discharge: not anticipated   Patient Condition: I have reviewed medical records from [facility] , spoken with  patient and daughter. I met with patient at the bedside for inpatient rehabilitation assessment.  Patient will benefit from ongoing PT, OT, and SLP, can actively participate in 3 hours of therapy a day 5 days of the week, and can make measurable  gains during the admission.  Patient will also benefit from the coordinated team approach during an Inpatient Acute Rehabilitation admission.  The patient will receive intensive therapy as well as Rehabilitation physician, nursing, social worker, and care management interventions.  Due to bladder management, bowel management, safety, skin/wound care, disease management, medication administration, pain management, and patient education the patient requires 24 hour a day rehabilitation nursing.  The patient is currently min assist overall with mobility and basic ADLs.  Discharge setting and therapy post discharge at home with home health is anticipated.  Patient has agreed to participate in the Acute Inpatient Rehabilitation Program and will admit today.   Preadmission Screen Completed By: Danne Baxter RN MSN, 06/10/2021 10:21 AM ______________________________________________________________________   Discussed status with Dr. Dagoberto Ligas on 06/10/21  at 10:21 AM and received  approval for admission today.   Admission Coordinator: Danne Baxter RN MSN, time 10:21 AM/Date 06/10/21     Assessment/Plan: Diagnosis: Does the need for close, 24 hr/day Medical supervision in concert with the patient's rehab needs make it unreasonable for this patient to be served in a less intensive setting? Yes Co-Morbidities requiring supervision/potential complications: demenita?; R hemi dfue to L pareital-occipital stroke s/p Carotid stending on L; HOH; CKD3, and HTN; previous stroke Due to bladder management, bowel management, safety, skin/wound care, disease management, medication administration, pain management, and patient education, does the patient require 24 hr/day rehab nursing? Yes Does the patient require coordinated care of a physician, rehab nurse, PT, OT, and SLP to address physical and functional deficits in the context of the above medical diagnosis(es)? Yes Addressing deficits in the following areas: balance, endurance, locomotion, strength, transferring, bowel/bladder control, bathing, dressing, feeding, grooming, toileting, cognition, speech, and language Can the patient actively participate in an intensive therapy program of at least 3 hrs of therapy 5 days a week? Yes The potential for patient to make measurable gains while on inpatient rehab is good and fair Anticipated functional outcomes upon discharge from inpatient rehab: supervision and min assist PT, supervision and min assist OT, supervision and min assist SLP Estimated rehab length of stay to reach the above functional goals is: 10-14 days Anticipated discharge destination: Home 10. Overall Rehab/Functional Prognosis: good and fair     MD Signature:           Revision History                          Note Details  Earl Lites, CCC-SLP File Time 06/12/2021 10:05 AM  Author Type Rehab Admission Coordinator Status Addendum  Last Editor Bethel Born, CCC-SLP Service  Physical Medicine and [place] # [id number] Admit Date 06/10/2021

## 2021-06-13 NOTE — Progress Notes (Signed)
Occupational Therapy Session Note  Patient Details  Name: Denise Leon MRN: [id number] Date of Birth: Sep 08, 1946  Today's Date: 06/13/2021 OT Individual Time: 1103-1594 OT Individual Time Calculation (min): 30 min + 30 min   Short Term Goals: Week 1:  OT Short Term Goal 1 (Week 1): Pt will complete LB dressing with Mod A OT Short Term Goal 2 (Week 1): Pt will complete ambulatory toilet transfer using RW with no more than CGA OT Short Term Goal 3 (Week 1): Pt will complete a shower transfer with CGA using LRAD  Skilled Therapeutic Interventions/Progress Updates:    Session 1 (5859-2924): Pt received semi-reclined in bed, agreeable to therapy. Session focus on self-care retraining, activity tolerance, RUE NMR, func transfers in prep for improved ADL/IADL/func mobility performance + decreased caregiver burden. Came to siting EOB with CGA to prevent posterior LOB. STS and short amb transfer > w/c with min A for safe RW use. Stratus interpreter utilized throughout session. Seated, pt completed 1x10 of B shoulder flexion + hold, chest press, and forward/backward circles with 1 lb dowel rod. Difficulty achieving R shoulder flexion and "keeping elbow away from body." C/o pain in RUE with activity ,but did not rate. Issued medium soft theraputty and practiced rolling putty back and forth + pinch/pull on elevated table to elicit improved Shoulder/elbow flexion/extension. In room, ambulatory transfer to toilet with CGA + RW. Min A to pull-up pants in back during toileting tasks. Amb transfer back to recliner same manner as before.    Pt left seated in recliner with chair alarm engaged, call bell in reach, and all immediate needs met.    Session 2 (1403-1433):Pt received seated in recliner, no c/o pain, agreeable to therapy. Stratus interpreter utilized throughout. Session focus on self-care retraining, activity tolerance, RUE NMR, func transfers in prep for improved ADL/IADL/func mobility performance +  decreased caregiver burden. STS with S and stand-pivot with CGA + RW > w/c. Seated, removed 10 beads from medium resistive theraputty with B hands, max cues to utilize R hand, but question language barrier as pt cont to primarily use L hand. Additionally completed R digit flexion into putty. Retrieved and removed level 1 and 2 resistive clothes pins from trunk height and placed on horizontal bar. Req assist to prevent trunk compensation and pt with difficulty extending R elbow. In room, ambulatory transfer to toilet with CGA + RW. Min A to pull-up pants in back during toileting tasks. Amb transfer back to bed and returned to supine with min A to progress LLE onto bed.  Pt left semi-reclined in bed with bed alarm engaged, call bell in reach, and all immediate needs met.    Therapy Documentation Precautions:  Precautions Precautions: Fall Precaution Comments: HoH better hearing in her Rt ear, speaks Tagalog and some English Restrictions Weight Bearing Restrictions: No  Pain: intermittent RUE pain, did not rate   ADL: See Care Tool for more details.  Therapy/Group: Individual Therapy  Volanda Napoleon MS, OTR/L  06/13/2021, 6:38 AM

## 2021-06-13 NOTE — Anesthesia Postprocedure Evaluation (Signed)
Anesthesia Post Note  Patient: Eudelia Hiltunen  Procedure(s) Performed: LEFT TRANSCAROTID ARTERY REVASCULARIZATION (Left: Neck) ULTRASOUND GUIDANCE FOR VASCULAR ACCESS, RIGHT FEMORAL VEIN (Right: Groin)     Patient location during evaluation: PACU Anesthesia Type: General Level of consciousness: awake and alert Pain management: pain level controlled Vital Signs Assessment: post-procedure vital signs reviewed and stable Respiratory status: spontaneous breathing, nonlabored ventilation, respiratory function stable and patient connected to nasal cannula oxygen Cardiovascular status: blood pressure returned to baseline and stable Postop Assessment: no apparent nausea or vomiting Anesthetic complications: no   No notable events documented.  Last Vitals:  Vitals:   06/10/21 0732 06/10/21 1113  BP: (!) 168/74 (!) 158/88  Pulse: 77 99  Resp: 18 17  Temp: 37.1 C 36.9 C  SpO2: 98% 97%    Last Pain:  Vitals:   06/10/21 1113  TempSrc: Oral  PainSc:                  Anaeli Cornwall

## 2021-06-13 NOTE — Progress Notes (Signed)
Speech Language Pathology Daily Session Note  Patient Details  Name: Denise Leon MRN: [id number] Date of Birth: September 09, 1946  Today's Date: 06/13/2021 SLP Individual Time: 0900-1000 SLP Individual Time Calculation (min): 60 min  Short Term Goals: Week 1: SLP Short Term Goal 1 (Week 1): Patient will demonstrate orientation (to place) given min verbal and visual cues. SLP Short Term Goal 2 (Week 1): Patient will complete mildly complex problem solving given modA verbal cues. SLP Short Term Goal 3 (Week 1): Patient will demonstrate recall of novel information with mod verbal cues with use of compensatory memory strategies  Skilled Therapeutic Interventions: Patient agreeable to skilled ST intervention with focus on cognitive goals. ST initially utilized video interpretation however patient requested to discontinue as she was unable to hear interpreter well due to HL and preferred for ST to communicate in [place]. Rec amplification for video interpreter device moving forward and per patient's wishes. Patient engaged in conversation regarding PLOF and interests. She reported her daughter assisted with grocery shopping, all driving, paying bills, and cooking. She was unable to recall if she took medicine. 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 She did not report use of any compensations with the exception of direct verbal reminders. As an example, she expressed that she would set out patient's medication and provide verbal cue to take it. Unless someone was physically there to watch her swallow her medication, she would likely forget to take it.  Patient utilized a walker and required at least standby assist from family for ADLs. Although patient enjoys cooking, daughter stated they discourage her from doing that on her own due to safety concerns 2/2 memory loss. Daughter reports her cognition  is near baseline from prior CVA however she noticed increased symptoms of anxiety and difficulty sleeping at night. Will report to team. Daughter had some questions regarding discharge planning and encouraged her to speak with SW. Daughter has number. She had no further questions for ST at this time. Patient requested to go to the bathroom to urinate. SLP assisted to bathroom with sup A for ambulation using standard walker, and mod A to initiate retrieving toilet paper to wipe, pulling up pants, and reminder to flush toilet. Patient was returned to bed at end of session with alarm activated and immediate needs within reach. Continue per current plan of care.      Pain Pain Assessment Pain Scale: 0-10 Pain Score: 0-No pain  Therapy/Group: Individual Therapy  Tamala Ser 06/13/2021, 9:21 AM

## 2021-06-13 NOTE — Progress Notes (Signed)
PROGRESS NOTE   Subjective/Complaints: Resting, no complaints noted Tolerated OT today- pain noted in RUE during session  ROS: limited due to language/communication    Objective:   No results found. Recent Labs    06/12/21 0558  WBC 8.9  HGB 9.0*  HCT 26.8*  PLT 302   Recent Labs    06/12/21 0558  NA 137  K 3.3*  CL 105  CO2 27  GLUCOSE 108*  BUN 18  CREATININE 1.03*  CALCIUM 8.4*    Intake/Output Summary (Last 24 hours) at 06/13/2021 0956 Last data filed at 06/13/2021 0725 Gross per 24 hour  Intake 440 ml  Output 1575 ml  Net -1135 ml        Physical Exam: Vital Signs Blood pressure (!) 157/78, pulse 82, temperature 98.5 F (36.9 C), resp. rate 19, height 5\' 4"  (1.626 m), weight 65.2 kg, SpO2 99 %. Gen: no distress, normal appearing HEENT: oral mucosa pink and moist, NCAT Cardio: Reg rate Chest: normal effort, normal rate of breathing Abd: soft, non-distended Ext: no edema Psych: Pt's affect is appropriate. Pt is cooperative Skin: left neck incision with scab, granulation, swelling decreased, area sl tender Neuro:  Alert and oriented to person, place. Right central 7, HOH, speech sl dysarthric. Follows commands, fair insight and awareness. Some word finding deficits. RUE 3-4/5 prox to 3 to 3+ distally. RLE 4-/5 prox to 4/5 distally Musculoskeletal: normal PROM, posture fair   Assessment/Plan: 1. Functional deficits which require 3+ hours per day of interdisciplinary therapy in a comprehensive inpatient rehab setting. Physiatrist is providing close team supervision and 24 hour management of active medical problems listed below. Physiatrist and rehab team continue to assess barriers to discharge/monitor patient progress toward functional and medical goals  Care Tool:  Bathing    Body parts bathed by patient: Chest, Abdomen, Front perineal area, Right upper leg, Left upper leg, Face   Body parts  bathed by helper: Right arm, Left arm, Buttocks, Right lower leg, Left lower leg     Bathing assist Assist Level: Moderate Assistance - Patient 50 - 74%     Upper Body Dressing/Undressing Upper body dressing   What is the patient wearing?: Pull over shirt    Upper body assist Assist Level: Moderate Assistance - Patient 50 - 74%    Lower Body Dressing/Undressing Lower body dressing      What is the patient wearing?: Pants     Lower body assist Assist for lower body dressing: Moderate Assistance - Patient 50 - 74%     Toileting Toileting    Toileting assist Assist for toileting: Minimal Assistance - Patient > 75%     Transfers Chair/bed transfer  Transfers assist     Chair/bed transfer assist level: Minimal Assistance - Patient > 75%     Locomotion Ambulation   Ambulation assist      Assist level: Minimal Assistance - Patient > 75% Assistive device: Walker-rolling Max distance: 65   Walk 10 feet activity   Assist     Assist level: Minimal Assistance - Patient > 75% Assistive device: Walker-rolling   Walk 50 feet activity   Assist    Assist level: Minimal  Assistance - Patient > 75% Assistive device: Walker-rolling    Walk 150 feet activity   Assist Walk 150 feet activity did not occur: Safety/medical concerns         Walk 10 feet on uneven surface  activity   Assist Walk 10 feet on uneven surfaces activity did not occur: Safety/medical concerns         Wheelchair     Assist   Type of Wheelchair: Manual    Wheelchair assist level: Total Assistance - Patient < 25% Max wheelchair distance: 150 ft    Wheelchair 50 feet with 2 turns activity    Assist        Assist Level: Total Assistance - Patient < 25%   Wheelchair 150 feet activity     Assist      Assist Level: Total Assistance - Patient < 25%   Blood pressure (!) 157/78, pulse 82, temperature 98.5 F (36.9 C), resp. rate 19, height 5\' 4"  (1.626 m),  weight 65.2 kg, SpO2 99 %.  Medical Problem List and Plan: 1.  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  Impaired mobility: -DVT/anticoagulation:  Mechanical: Sequential compression devices, below knee Bilateral lower extremities             -antiplatelet therapy: continue aspirin 325 mg daily and Plavix 75 mg daily 3. Pain: Continue Oxycodone as needed 4. Depression: Continue Cymbalta 30 mg daily             -antipsychotic agents: N/A 5. Neuropsych: This patient is capable of making decisions on her own behalf. 6. Skin/Wound Care: Routine skin checks 7. Fluids/Electrolytes/Nutrition: encourage PO  -8/29 Hypokalemia 3.3--> supplement with kdur 9/29 daily 8.  Hyperlipidemia.  Lipitor 9.  CKD stage III.Creatinine baseline 1.10-1.22---stable 8/29 10.  Incidental 2 cm left thyroid nodule.  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 Will also start hydralazine 25 mg q8 hours prn for SBP >180 and DBP >100.   8/30 bp in range 12. Urinary frequency-  U/A negative. and Cx (was (-)--monitor patterns 13. Constipation-   Sorbitol  60cc x1 yesterday with results 14. ABLA: hgb trending down post-op  -iron supplement  -recheck Wednesday  -surgical site appears improved    LOS: 3 days A FACE TO FACE EVALUATION WAS PERFORMED  9/30 Sheela Mcculley 06/13/2021, 9:56 AM

## 2021-06-13 NOTE — Progress Notes (Signed)
Physical Therapy Session Note  Patient Details  Name: Denise Leon MRN: [id number] Date of Birth: 22-Oct-1945  Today's Date: 06/13/2021 PT Individual Time: 1000-1110  PT individual Time Calculation (min): 70 min    Short Term Goals: Week 1:  PT Short Term Goal 1 (Week 1): Patient will perform bed mobility with supervision. PT Short Term Goal 2 (Week 1): Patient will perform basic transfers with supervision using LRAD. PT Short Term Goal 3 (Week 1): Patient will ambulate >100 feet with CGA using LRAD.  Skilled Therapeutic Interventions/Progress Updates:  Patient supine in bed asleep on entrance to room. Patient alert and agreeable to PT session. Patient denied pain during session.  No w/c available in pt's room, w/c attained for ease of pt transport. [employer] interpreter used for complete understanding outside of pt's understanding of English. Pt with flat affect and relates not having left room yet. Pt taken on brief tour of hall and therapy gyms with explanations for expectations of therapy prior to d/c home. All for improved therapeutic alliance in presence of cultural differences.   Therapeutic Activity: Bed Mobility: Patient performed supine <> sit with supervision. VC/ tc required for technique/ effort. Transfers: Patient performed STS and SPVT transfers initially with Min A and improving to CGA/ supervision with vc/ tc throughout session. Toilet transfer performed with CGA and pt performing self pare with supervision, LB dressing with CGA/ light Min A for re-donning pants.  Gait Training:  Patient ambulated across room as well as 39' x1/ 9' x1 using RW with Min A/ CGA and w/c follow for fatigue. Demonstrated slow pace with decreased RLE step height/ length. Provided vc/ tc for upright posture, leading steps with increased knee flexion.  Neuromuscular Re-ed: NMR facilitated during session with focus on sitting/ standing balance, muscle activation, propriocpetion. Pt guided in resisted  sitting balance challenges with good ability to maintain upright position. Lateral scooting performed with min A initially for forward lean and transition of balance/ unweighting of bottom. Minisquats performed with good quad activation in RLE and tc/ vc for midline orientation for equal WB.  NMR performed for improvements in motor control and coordination, balance, sequencing, judgement, and self confidence/ efficacy in performing all aspects of mobility at highest level of independence.   Patient supine  in bed at end of session with brakes locked, bed alarm set, and all needs within reach. Oriented to current time and time of next therapy session.     Therapy Documentation Precautions:  Precautions Precautions: Fall Precaution Comments: HoH better hearing in her Rt ear, speaks Tagalog and some English Restrictions Weight Bearing Restrictions: No  Therapy/Group: Individual Therapy  Loel Dubonnet PT, DPT 06/13/2021, 8:51 AM

## 2021-06-14 DIAGNOSIS — E876 Hypokalemia: Secondary | ICD-10-CM

## 2021-06-14 DIAGNOSIS — I639 Cerebral infarction, unspecified: Secondary | ICD-10-CM | POA: Diagnosis not present

## 2021-06-14 DIAGNOSIS — I63232 Cerebral infarction due to unspecified occlusion or stenosis of left carotid arteries: Secondary | ICD-10-CM | POA: Diagnosis not present

## 2021-06-14 DIAGNOSIS — D62 Acute posthemorrhagic anemia: Secondary | ICD-10-CM

## 2021-06-14 DIAGNOSIS — N1831 Chronic kidney disease, stage 3a: Secondary | ICD-10-CM | POA: Diagnosis not present

## 2021-06-14 DIAGNOSIS — I10 Essential (primary) hypertension: Secondary | ICD-10-CM

## 2021-06-14 LAB — CBC
HCT: 29.1 % — ABNORMAL LOW (ref 36.0–46.0)
Hemoglobin: 9.7 g/dL — ABNORMAL LOW (ref 12.0–15.0)
MCH: 30.9 pg (ref 26.0–34.0)
MCHC: 33.3 g/dL (ref 30.0–36.0)
MCV: 92.7 fL (ref 80.0–100.0)
Platelets: 363 10*3/uL (ref 150–400)
RBC: 3.14 MIL/uL — ABNORMAL LOW (ref 3.87–5.11)
RDW: 13 % (ref 11.5–15.5)
WBC: 9.5 10*3/uL (ref 4.0–10.5)
nRBC: 0 % (ref 0.0–0.2)

## 2021-06-14 MED ORDER — AMLODIPINE BESYLATE 5 MG PO TABS
7.5000 mg | ORAL_TABLET | Freq: Every day | ORAL | Status: DC
  Administered 2021-06-15: 7.5 mg via ORAL
  Filled 2021-06-14: qty 1

## 2021-06-14 MED ORDER — AMLODIPINE BESYLATE 2.5 MG PO TABS
2.5000 mg | ORAL_TABLET | Freq: Once | ORAL | Status: AC
  Administered 2021-06-14: 2.5 mg via ORAL
  Filled 2021-06-14: qty 1

## 2021-06-14 NOTE — Progress Notes (Signed)
Occupational Therapy Session Note  Patient Details  Name: Denise Leon MRN: [id number] Date of Birth: 04/26/1946  Today's Date: 06/14/2021 OT Individual Time: 1001-1041 OT Individual Time Calculation (min): 40 min  and Today's Date: 06/14/2021 OT Missed Time: 20 Minutes + 45 min Missed Time Reason: Pain;Patient fatigue   Short Term Goals: Week 1:  OT Short Term Goal 1 (Week 1): Pt will complete LB dressing with Mod A OT Short Term Goal 2 (Week 1): Pt will complete ambulatory toilet transfer using RW with no more than CGA OT Short Term Goal 3 (Week 1): Pt will complete a shower transfer with CGA using LRAD  Skilled Therapeutic Interventions/Progress Updates:    Session 1 (4034-7425): Pt received semi-reclined in bed, c/o ongoing RUE pain and reports fatigue. Declines offer to shower/change and indicates preference to communicate in [place] vs [place] interpreter. With encouragement, agreeable to room level therex. Session focus on self-care retraining, activity tolerance, func transfers, RUE NMR in prep for improved ADL/IADL/func mobility performance + decreased caregiver burden. Came to sitting EOB with min A to lift trunk. STS and amb to and from toilet with RW + CGA, occasional cues to keep R hand placed on RW. Min A to pull up pants in back. Amb to sink with CGA + RW to wash hands. Req cues to wash hands vs face when handed soap. Pt with questions how to clean neck incision from surgery, will f/u with MD. Per H&P, must cover during shower. Short distance amb > recliner same manner as before. Provided markers and coloring sheets, placed sheet in front of pt outside pt BOS to encourage R shoulder flexion/extension, but pt with significant difficulty completing without mod physical assist. Completed 1x10 of the following with level 1 resistive theraband: crossbody and forward punches, biceps curls, shoulder flexion with RUE. Indicating increase in RUE pain so activity ceased. Assisted with  therapeutic positioning. Declining further therapy 2/2 RUE pain. Missed 20 min of scheduled OT.  Pt left seated in w/c with chair alarm engaged, call bell in reach, and all immediate needs met.     Session 2 : Attempted to see pt for scheduled OT session, received asleep in bed. Awakens to moderate Vcs. Declining therapy at this time , stating "I'm tired." Shook head no when offered assistance to bathroom, room level exercises. Pt missed 45 min of scheduled OT. Pt left semi-reclined in bed with bed alarm engaged, call bell in reach, and all immediate needs met.    Therapy Documentation Precautions:  Precautions Precautions: Fall Precaution Comments: HoH better hearing in her Rt ear, speaks Tag[place]og and some English Restrictions Weight Bearing Restrictions: No  Pain: ongoing RUE pain, did not rate   ADL: See Care Tool for more details.   Therapy/Group: Individual Therapy  Volanda Napoleon MS, OTR/L  06/14/2021, 6:45 AM

## 2021-06-14 NOTE — Progress Notes (Signed)
Patient ID: Denise Leon, female   DOB: [birth year], 75 y.o.   MRN: [id number]  SW made attempt to call pt daughter to provide team conference updates. Left VM. SW will cont to follow up  Lavera Guise, [place] [phone]

## 2021-06-14 NOTE — Discharge Summary (Signed)
[facility] Discharge Summary   Patient: Denise Leon ZOX:[id number]  PCP: Patient, No Pcp Per (Inactive)  Date of admission: 06/03/2021   Date of discharge: 06/10/2021      Discharge Diagnoses:  Principal diagnosis Acute CVA  Active Problems:   Hyperlipidemia   CKD (chronic kidney disease), stage IIIa   Thyroid nodule, L   Acute right-sided weakness   Hypocalcemia   Hypertensive urgency   History of CVA (cerebrovascular accident)   Leukocytosis   Stroke (cerebrum) (HCC)   Stroke-like symptoms   Admitted From: Home Disposition:  CIR   Recommendations for Outpatient Follow-up:  PCP: Follow-up with PCP, follow-up with vascular surgery, follow-up with neurology as recommended.   Follow-up Information     [facility] Follow up in 4 week(s).   Specialty: Vascular Surgery Why: Office will call you to arrange your appt (sent) Contact information: [address] [place] [address] [phone]                 Diet recommendation: Cardiac diet  Activity: The patient is advised to gradually reintroduce usual activities, as tolerated  Discharge Condition: stable  Code Status: Full code   History of present illness: As per the H and P dictated on admission, " Denise Leon is a 75 y.o. female with medical history significant of hypertension, hyperlipidemia, CVA, palpitations s/p recorder, hearing loss and asthma presents with complaints of right-sided numbness and weakness starting last night around 2100.  History is somewhat limited.  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  Despite trying to use the interpreter history seem to be limited due to her hearing.  She reported having numbness  to the right side of her face and right arm, but reported that the numbness had resolved at this time in her face.  Patient also had noted some weakness in her right upper extremity.  This seems  since the patient had been transferred to [facility] she reports numbness sensations in her bilateral lower extremities.  When asked patient reports that she had no weakness as it relates to her previous stroke and that her symptoms were new.  Denies having any headache, change in vision, chest pain, palpitations, nausea, vomiting, diarrhea, or dysuria. She had been still taking ASA.Marland Kitchen   A few days prior the patient was anxious which her daughter notes having during her previous stroke.  Talks with her daughter over the phone she also denies that the patient had any symptoms of weakness or is it related to her prior stroke.   ED Course: Upon admission into the emergency department patient was seen to be afebrile, respiration 15-24, blood pressures elevated up to 196/99, and all other vital signs maintained.  Code stroke was activated and due to findings of mild right arm drift with endorsement of change in sensation of her right upper extremity.  CT scan of the brain showed no acute abnormalities and CTA of the head neck noted no signs of large vessel occlusion.  Case has been discussed with telemetry neurology who evaluated patient and recommended no tPA.  Labs significant for WBC 11.3, BUN 30, creatinine 1.27, and calcium 8.5.  Alcohol level was undetectable and UDS negative for any substances.  Influenza and COVID-19 screening were negative.  Teleneurology recommended admission for stroke work-up with MRI of the brain, but felt symptoms most likely new stroke related with baseline deficits.  "  Hospital Course:  Summary of her  active problems in the hospital is as following.   1.  Acute left parietal occipital infarct secondary to left ICA high risk plaque. Prior history of CVA requiring tPA on the left frontal area. CT head Chronic ischemic microangiopathy. CTA showed R ICA Plaque without significant stenosis. MRI shows acute infarct in the left parietal occipital region. LDL 131, now 1 Lipitor 80  mg. Loop recorder reportedly showed no A. fib. Echocardiogram showed EF of 55 to 60% no cardiac source of embolization.  PT OT recommends CIR. Currently on 325 mg aspirin and 75 mg Plavix.  Outpatient follow-up with neurology and vascular surgery recommended. Hemoglobin A1c 5.8.   2.  Carotid atherosclerosis. Patient with recurrent stroke on the left side along with left ICA stenosis. 8/25 underwent transcatheter placement of left ICA stent. No significant high-grade stenosis. Postprocedure developed left neck hematoma, currently asymptomatic. Monitor.  Per vascular surgery patient can be discharged to CIR.   3.  HTN Blood pressure stable. Currently on hydralazine and labetalol. Monitor.   4.  HLD LDL 131. Continue Lipitor 80 mg.   5.  AKI Baseline appears to be 0.9. On presentation serum creatinine 1.27 with elevated BUN. Improved with IV hydration. Monitor postoperatively.   6.  Undiagnosed dementia Patient appears to be forgetful. Potentially has undiagnosed dementia from vascular insult. Monitor for now. Body mass index is 23.27 kg/m.    Patient was seen by physical therapy, who recommended CIR, . On the day of the discharge the patient's vitals were stable, and no other new acute medical condition were reported. The patient was felt safe to be discharge at [facility] with Therapy.  Consultants: Neurology, vascular surgery Procedures: Transcatheter left ICA stenting  DISCHARGE MEDICATION: Allergies as of 06/10/2021   No Known Allergies      Medication List     ASK your doctor about these medications    amLODipine 5 MG tablet Commonly known as: NORVASC TAKE 1 TABLET(5 MG) BY MOUTH DAILY   aspirin 325 MG EC tablet Take 1 tablet (325 mg total) by mouth daily.   atorvastatin 80 MG tablet Commonly known as: LIPITOR Take 1 tablet (80 mg total) by mouth daily.   carvedilol 6.25 MG tablet Commonly known as: COREG Take 1 tablet (6.25 mg total) by mouth 2 (two)  times daily.   DULoxetine 30 MG capsule Commonly known as: CYMBALTA Take 1 capsule (30 mg total) by mouth daily.   latanoprost 0.005 % ophthalmic solution Commonly known as: XALATAN Place 1 drop into both eyes at bedtime.   Vitamin A 2400 MCG (8000 UT) Caps Take 2,400 mcg by mouth daily.   vitamin C 500 MG tablet Commonly known as: ASCORBIC ACID Take 500 mg by mouth daily.        Discharge Exam: Filed Weights   06/03/21 2222 06/04/21 1100 06/08/21 1551  Weight: 62.5 kg 62.5 kg 61.5 kg   Vitals:   06/10/21 0732 06/10/21 1113  BP: (!) 168/74 (!) 158/88  Pulse: 77 99  Resp: 18 17  Temp: 98.7 F (37.1 C) 98.4 F (36.9 C)  SpO2: 98% 97%   General: Appear in mild distress, no Rash; Oral Mucosa Clear, moist. no improving left neck hematoma, Conjunctiva normal  Cardiovascular: S1 and S2 Present, no Murmur, Respiratory: good respiratory effort, Bilateral Air entry present and CTA, no Crackles, no wheezes Abdomen: Bowel Sound present, Soft and no tenderness Extremities: no Pedal edema Neurology: alert and oriented to time, place, and person affect appropriate. no new focal deficit  Gait not checked due to patient safety concerns    The results of significant diagnostics from this hospitalization (including imaging, microbiology, ancillary and laboratory) are listed below for reference.    Significant Diagnostic Studies: CT Angio Head W or Wo Contrast  Result Date: 06/03/2021 CLINICAL DATA:  Right-sided weakness EXAM: CT ANGIOGRAPHY HEAD AND NECK TECHNIQUE: Multidetector CT imaging of the head and neck was performed using the standard protocol during bolus administration of intravenous contrast. Multiplanar CT image reconstructions and MIPs were obtained to evaluate the vascular anatomy. Carotid stenosis measurements (when applicable) are obtained utilizing NASCET criteria, using the distal internal carotid diameter as the denominator. CONTRAST:  OMNIPAQUE IOHEXOL 350  MG/ML SOLN COMPARISON:  11/14/2019 CTA head neck FINDINGS: CTA NECK FINDINGS SKELETON: There is no bony spinal canal stenosis. No lytic or blastic lesion. OTHER NECK: 2.0 cm left thyroid nodule. UPPER CHEST: No pneumothorax or pleural effusion. No nodules or masses. AORTIC ARCH: There is soft and calcific atherosclerosis of the aortic arch. There is no aneurysm, dissection or hemodynamically significant stenosis of the visualized portion of the aorta. Conventional 3 vessel aortic branching pattern. The visualized proximal subclavian arteries are widely patent. RIGHT CAROTID SYSTEM: Normal without aneurysm, dissection or stenosis. LEFT CAROTID SYSTEM: No dissection, occlusion or aneurysm. Mild atherosclerotic calcification at the carotid bifurcation without hemodynamically significant stenosis. VERTEBRAL ARTERIES: Left dominant configuration. Both origins are clearly patent. There is no dissection, occlusion or flow-limiting stenosis to the skull base (V1-V3 segments). CTA HEAD FINDINGS POSTERIOR CIRCULATION: --Vertebral arteries: Normal V4 segments. --Inferior cerebellar arteries: Normal. --Basilar artery: Normal. --Superior cerebellar arteries: Normal. --Posterior cerebral arteries (PCA): Normal. ANTERIOR CIRCULATION: --Intracranial internal carotid arteries: Normal. --Anterior cerebral arteries (ACA): Normal. Both A1 segments are present. Patent anterior communicating artery (a-comm). --Middle cerebral arteries (MCA): Normal. VENOUS SINUSES: As permitted by contrast timing, patent. ANATOMIC VARIANTS: None Review of the MIP images confirms the above findings. IMPRESSION: 1. No emergent large vessel occlusion or high-grade stenosis of the intracranial or cervical arteries. 2. 2 cm left thyroid nodule. Recommend thyroid US (ref: J Am Coll Radiol. 2015 Feb;12(2): 143-50). Aortic Atherosclerosis (ICD10-I70.0). Electronically Signed   By: Deatra Robinson M.D.   On: 06/03/2021 22:33   MR BRAIN WO CONTRAST  Result Date:  06/05/2021 CLINICAL DATA:  Follow-up examination for acute stroke. EXAM: MRI HEAD WITHOUT CONTRAST TECHNIQUE: Multiplanar, multiecho pulse sequences of the brain and surrounding structures were obtained without intravenous contrast. COMPARISON:  Prior CT from 06/03/2021. FINDINGS: Brain: Diffuse prominence of the CSF containing spaces compatible generalized age-related cerebral atrophy. Patchy and confluent T2/FLAIR hyperintensity within the periventricular and deep white matter both cerebral hemispheres most consistent with chronic small vessel ischemic disease, moderate to advanced in nature. Few scattered superimposed remote lacunar infarcts noted about the left hemispheric cerebral white matter. Patchy areas of restricted diffusion seen involving the cortical and subcortical aspect of the left parieto-occipital region, consistent with acute ischemic infarcts (series 3, images 42-30). Extension to involve the left periatrial white matter. Associated susceptibility artifact consistent with petechial hemorrhage without frank intraparenchymal hematoma or hemorrhagic transformation. No significant regional mass effect. No other evidence for acute or subacute ischemia. Gray-white matter differentiation otherwise maintained. No other areas of remote cortical infarction. Few additional chronic micro hemorrhages noted, nonspecific, but likely small vessel related/hypertensive in nature. No mass lesion, midline shift or mass effect. No hydrocephalus or extra-axial fluid collection. Partially empty sella noted. Suprasellar region normal. Midline structures intact. Vascular: Major intracranial vascular flow voids are maintained. Skull  and upper cervical spine: Craniocervical junction within normal limits. Bone marrow signal intensity normal. No scalp soft tissue abnormality. Sinuses/Orbits: Patient status post bilateral ocular lens replacement. Paranasal sinuses are clear. Chronic appearing left mastoid effusion noted.  Other: None. IMPRESSION: 1. Patchy acute ischemic infarcts involving the left parieto-occipital region as above. Associated petechial hemorrhage without frank intraparenchymal hematoma. No significant regional mass effect. 2. Underlying age-related cerebral atrophy with moderate to advanced chronic microvascular ischemic disease. Electronically Signed   By: Rise MuBenjamin  McClintock M.D.   On: 06/05/2021 00:03   DG CHEST PORT 1 VIEW  Result Date: 06/04/2021 CLINICAL DATA:  TIA EXAM: PORTABLE CHEST 1 VIEW COMPARISON:  11/03/2019 FINDINGS: Heart size within normal limits. Implanted cardiac device is noted overlying the left chest. No pulmonary vascular congestion. Lungs are clear. Advanced degenerative changes of the shoulder. IMPRESSION: No acute cardiopulmonary process. Electronically Signed   By: Acquanetta BellingFarhaan  Mir M.D.   On: 06/04/2021 12:21   ECHOCARDIOGRAM COMPLETE  Result Date: 06/05/2021    ECHOCARDIOGRAM REPORT   Patient Name:   Denise BailiffARLYN Maharaj Date of Exam: 06/05/2021 Medical Rec #:  [id number]    Height:       62.0 in Accession #:    [id number]   Weight:       137.8 lb Date of Birth:  [birth year]   BSA:          1.632 m Patient Age:    74 years     BP:           161/91 mmHg Patient Gender: F            HR:           78 bpm. Exam Location:  Inpatient Procedure: 2D Echo, Color Doppler and Cardiac Doppler Indications:    TIA  History:        Patient has prior history of Echocardiogram examinations, most                 recent 11/14/2019. Risk Factors:Hypertension and Dyslipidemia.  Sonographer:    Irving BurtonEmily Senior RDCS Referring Phys: [phone] RONDELL A SMITH  Sonographer Comments: Technically difficult study, patient unable to follow commands to turn or breath hold. IMPRESSIONS  1. Left ventricular ejection fraction, by estimation, is 55 to 60%. The left ventricle has normal function. The left ventricle has no regional wall motion abnormalities. There is mild left ventricular hypertrophy. Left ventricular diastolic  parameters are consistent with Grade I diastolic dysfunction (impaired relaxation).  2. Right ventricular systolic function is normal. The right ventricular size is normal. There is normal pulmonary artery systolic pressure. The estimated right ventricular systolic pressure is 20.3 mmHg.  3. The mitral valve is normal in structure. No evidence of mitral valve regurgitation. No evidence of mitral stenosis.  4. The aortic valve is tricuspid. Aortic valve regurgitation is not visualized. No aortic stenosis is present.  5. Aortic dilatation noted. 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  6. The inferior vena cava is normal in size with greater than 50% respiratory variability, suggesting right atrial pressure of 3 mmHg. FINDINGS  Left Ventricle: Left ventricular ejection fraction, by estimation, is 55 to 60%. The left ventricle has normal function. The left ventricle has no regional wall motion abnormalities. The left ventricular internal cavity size was normal in size. There is  mild left ventricular hypertrophy. Left ventricular diastolic parameters are consistent with Grade I diastolic dysfunction (impaired relaxation). Right Ventricle: The right ventricular size is normal. No  increase in right ventricular wall thickness. Right ventricular systolic function is normal. There is normal pulmonary artery systolic pressure. The tricuspid regurgitant velocity is 2.08 m/s, and  with an assumed right atrial pressure of 3 mmHg, the estimated right ventricular systolic pressure is 20.3 mmHg. Left Atrium: Left atrial size was normal in size. Right Atrium: Right atrial size was normal in size. Pericardium: There is no evidence of pericardial effusion. Presence of pericardial fat pad. Mitral Valve: The mitral valve is normal in structure. No evidence of mitral valve regurgitation. No evidence of mitral valve stenosis. Tricuspid Valve: The tricuspid valve is normal in structure. Tricuspid valve  regurgitation is trivial. Aortic Valve: The aortic valve is tricuspid. Aortic valve regurgitation is not visualized. No aortic stenosis is present. Pulmonic Valve: The pulmonic valve was not well visualized. Pulmonic valve regurgitation is trivial. Aorta: The aortic root is normal in size and structure and aortic dilatation noted. 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 Venous: The inferior vena cava is normal in size with greater than 50% respiratory variability, suggesting right atrial pressure of 3 mmHg. IAS/Shunts: The interatrial septum was not well visualized.  LEFT VENTRICLE PLAX 2D LVIDd:         4.40 cm  Diastology LVIDs:         2.90 cm  LV e' medial:    4.57 cm/s LV PW:         1.10 cm  LV E/e' medial:  12.1 LV IVS:        0.90 cm  LV e' lateral:   6.42 cm/s LVOT diam:     2.00 cm  LV E/e' lateral: 8.6 LV SV:         46 LV SV Index:   28 LVOT Area:     3.14 cm  RIGHT VENTRICLE RV S prime:     16.10 cm/s LEFT ATRIUM             Index       RIGHT ATRIUM           Index LA diam:        2.80 cm 1.72 cm/m  RA Area:     11.10 cm LA Vol (A2C):   34.9 ml 21.39 ml/m RA Volume:   22.90 ml  14.03 ml/m LA Vol (A4C):   50.0 ml 30.64 ml/m LA Biplane Vol: 44.7 ml 27.39 ml/m  AORTIC VALVE LVOT Vmax:   77.70 cm/s LVOT Vmean:  63.300 cm/s LVOT VTI:    0.147 m  AORTA Ao Root diam: 3.50 cm Ao Asc diam:  3.70 cm MITRAL VALVE               TRICUSPID VALVE MV Area (PHT): 3.93 cm    TR Peak grad:   17.3 mmHg MV Decel Time: 193 msec    TR Vmax:        208.00 cm/s MV E velocity: 55.30 cm/s MV A velocity: 81.80 cm/s  SHUNTS MV E/A ratio:  0.68        Systemic VTI:  0.15 m                            Systemic Diam: 2.00 cm Epifanio Lesches MD Electronically signed by Epifanio Lesches MD Signature Date/Time: 06/05/2021/10:01:05 AM    Final     CT HEAD CODE STROKE WO CONTRAST  Result Date: 06/03/2021 CLINICAL DATA:  Code stroke.  Right-sided weakness  EXAM: CT HEAD WITHOUT CONTRAST  TECHNIQUE: Contiguous axial images were obtained from the base of the skull through the vertex without intravenous contrast. COMPARISON:  None. FINDINGS: Brain: There is no mass, hemorrhage or extra-axial collection. There is generalized atrophy without lobar predilection. There is hypoattenuation of the periventricular white matter, most commonly indicating chronic ischemic microangiopathy. Vascular: No abnormal hyperdensity of the major intracranial arteries or dural venous sinuses. No intracranial atherosclerosis. Skull: The visualized skull base, calvarium and extracranial soft tissues are normal. Sinuses/Orbits: No fluid levels or advanced mucosal thickening of the visualized paranasal sinuses. No mastoid or middle ear effusion. The orbits are normal. ASPECTS [facility] Stroke Program Early CT Score) - Ganglionic level infarction (caudate, lentiform nuclei, internal capsule, insula, M1-M3 cortex): 7 - Supraganglionic infarction (M4-M6 cortex): 3 Total score (0-10 with 10 being normal): 10 IMPRESSION: 1. Chronic ischemic microangiopathy and generalized atrophy without acute intracranial abnormality. 2. ASPECTS is 10. These results were called by telephone at the time of interpretation on 06/03/2021 at 10:07 pm to provider [facility] , who verbally acknowledged these results. Electronically Signed   By: Deatra Robinson M.D.   On: 06/03/2021 22:07   Structural Heart Procedure  Result Date: 06/08/2021 See surgical note for result.  CT ANGIO NECK CODE STROKE  Result Date: 06/03/2021 CLINICAL DATA:  Right-sided weakness EXAM: CT ANGIOGRAPHY HEAD AND NECK TECHNIQUE: Multidetector CT imaging of the head and neck was performed using the standard protocol during bolus administration of intravenous contrast. Multiplanar CT image reconstructions and MIPs were obtained to evaluate the vascular anatomy. Carotid stenosis measurements (when applicable) are obtained utilizing NASCET criteria, using the distal internal  carotid diameter as the denominator. CONTRAST:  OMNIPAQUE IOHEXOL 350 MG/ML SOLN COMPARISON:  11/14/2019 CTA head neck FINDINGS: CTA NECK FINDINGS SKELETON: There is no bony spinal canal stenosis. No lytic or blastic lesion. OTHER NECK: 2.0 cm left thyroid nodule. UPPER CHEST: No pneumothorax or pleural effusion. No nodules or masses. AORTIC ARCH: There is soft and calcific atherosclerosis of the aortic arch. There is no aneurysm, dissection or hemodynamically significant stenosis of the visualized portion of the aorta. Conventional 3 vessel aortic branching pattern. The visualized proximal subclavian arteries are widely patent. RIGHT CAROTID SYSTEM: Normal without aneurysm, dissection or stenosis. LEFT CAROTID SYSTEM: No dissection, occlusion or aneurysm. Mild atherosclerotic calcification at the carotid bifurcation without hemodynamically significant stenosis. VERTEBRAL ARTERIES: Left dominant configuration. Both origins are clearly patent. There is no dissection, occlusion or flow-limiting stenosis to the skull base (V1-V3 segments). CTA HEAD FINDINGS POSTERIOR CIRCULATION: --Vertebral arteries: Normal V4 segments. --Inferior cerebellar arteries: Normal. --Basilar artery: Normal. --Superior cerebellar arteries: Normal. --Posterior cerebral arteries (PCA): Normal. ANTERIOR CIRCULATION: --Intracranial internal carotid arteries: Normal. --Anterior cerebral arteries (ACA): Normal. Both A1 segments are present. Patent anterior communicating artery (a-comm). --Middle cerebral arteries (MCA): Normal. VENOUS SINUSES: As permitted by contrast timing, patent. ANATOMIC VARIANTS: None Review of the MIP images confirms the above findings. IMPRESSION: 1. No emergent large vessel occlusion or high-grade stenosis of the intracranial or cervical arteries. 2. 2 cm left thyroid nodule. Recommend thyroid US (ref: J Am Coll Radiol. 2015 Feb;12(2): 143-50). Aortic Atherosclerosis (ICD10-I70.0). Electronically Signed   By: Deatra Robinson M.D.   On: 06/03/2021 22:33   HYBRID OR IMAGING ([facility] ONLY)  Result Date: 06/08/2021 There is no interpretation for this exam.  This order is for images obtained during a surgical procedure.  Please See "Surgeries" Tab for more information regarding the procedure.    Microbiology: Recent  Results (from the past 240 hour(s))  Surgical pcr screen     Status: None   Collection Time: 06/08/21  7:22 AM   Specimen: Nasal Mucosa; Nasal Swab  Result Value Ref Range Status   MRSA, PCR NEGATIVE NEGATIVE Final   Staphylococcus aureus NEGATIVE NEGATIVE Final    Comment: (NOTE) The Xpert SA Assay (FDA approved for NASAL specimens in patients 15 years of age and older), is one component of a comprehensive surveillance program. It is not intended to diagnose infection nor to guide or monitor treatment. Performed at [facility], [address] [place], [place] [address]   Urine Culture     Status: None   Collection Time: 06/11/21  8:09 AM   Specimen: Urine, Clean Catch  Result Value Ref Range Status   Specimen Description URINE, CLEAN CATCH  Final   Special Requests NONE  Final   Culture   Final    NO GROWTH Performed at [facility], [address], [place], [place] [address]    Report Status 06/12/2021 FINAL  Final     Labs: CBC: Recent Labs  Lab 06/07/21 0236 06/08/21 0433 06/09/21 0240 06/10/21 0246  WBC 8.5 8.4 14.8* 10.6*  NEUTROABS  --   --   --   --   HGB 12.0 12.1 12.0 9.3*  HCT 35.0* 35.4* 35.3* 27.5*  MCV 90.0 90.1 91.9 92.6  PLT 279 264 247 240   Basic Metabolic Panel: Recent Labs  Lab 06/07/21 0236 06/08/21 0433 06/09/21 0240 06/10/21 0246  NA 139 138 137 139  K 3.6 3.5 4.8 3.8  CL 104 108 107 108  CO2 26 23 20* 25  GLUCOSE 95 103* 135* 109*  BUN 17 17 22  26*  CREATININE 1.12* 0.97 1.22* 1.32*  CALCIUM 8.8* 8.5* 8.2* 8.2*   Liver Function Tests: Recent Labs  Lab 06/07/21 0236 06/09/21 0240  AST 23 38  ALT 15 24  ALKPHOS 44 43   BILITOT 1.1 0.9  PROT 5.8* 5.6*  ALBUMIN 3.0* 2.9*   CBG: No results for input(s): GLUCAP in the last 168 hours.  Time spent: 35 minutes  Signed:  06/11/21  [facility] 06/10/2021

## 2021-06-14 NOTE — Progress Notes (Signed)
Physical Therapy Session Note  Patient Details  Name: Denise Leon MRN: [id number] Date of Birth: November 24, 1945  Today's Date: 06/14/2021   Short Term Goals: Week 1:  PT Short Term Goal 1 (Week 1): Patient will perform bed mobility with supervision. PT Short Term Goal 2 (Week 1): Patient will perform basic transfers with supervision using LRAD. PT Short Term Goal 3 (Week 1): Patient will ambulate >100 feet with CGA using LRAD.   Skilled Therapeutic Interventions/Progress Updates:   Pt received supine in bed with head covered by blanket. PT attempted to arouse pt for treatment, but pt did not engage to verbal of physical stimulation for PT treatment. No interpreter present. PT left supine in bed with call bell in reach and all needs met.      Therapy Documentation Precautions:  Precautions Precautions: Fall Precaution Comments: HoH better hearing in her Rt ear, speaks Tagalog and some English Restrictions Weight Bearing Restrictions: No General: PT Amount of Missed Time (min): 45 Minutes PT Missed Treatment Reason: Patient fatigue Vital Signs: Therapy Vitals Temp: 98.8 F (37.1 C) Temp Source: Oral Pulse Rate: 81 Resp: 19 BP: 119/75 Patient Position (if appropriate): Lying Oxygen Therapy SpO2: 100 % O2 Device: Room Air     Therapy/Group: Individual Therapy  Lorie Phenix 06/14/2021, 5:52 PM

## 2021-06-14 NOTE — Patient Care Conference (Signed)
Inpatient RehabilitationTeam Conference and Plan of Care Update Date: 06/14/2021   Time: 11:20 AM    Patient Name: Denise Leon      Medical Record Number: [id number]  Date of Birth: 1946-05-08 Sex: Female         Room/Bed: 4M04C/4M04C-01 Payor Info: Payor: MEDICARE / Plan: MEDICARE PART A AND B / Product Type: *No Product type* /    Admit Date/Time:  06/10/2021  1:55 PM  Primary Diagnosis:  CVA (cerebral vascular accident) [facility])  Hospital Problems: Principal Problem:   CVA (cerebral vascular accident) (HCC) Active Problems:   Acute ischemic stroke (HCC) patchy L frontal infarcts s/p tPA   CKD (chronic kidney disease), stage IIIa   Occlusion of carotid stent, subsequent encounter   Acute blood loss anemia   Hypokalemia    Expected Discharge Date: Expected Discharge Date: 06/24/21  Team Members Present: Physician leading conference: Dr. Maryla Morrow Social Worker Present: Lavera Guise, BSW Nurse Present: Chana Bode, RN PT Present: Grier Rocher, PT OT Present: Annye English, OT SLP Present: Eilene Ghazi, SLP PPS Coordinator present : Fae Pippin, SLP     Current Status/Progress Goal Weekly Team Focus  Bowel/Bladder   Continent of B/B, Retaining urine, in and out cath for PVR >300. LBM 8/28  Remain continent, Fully empty bladder when voiding.  Continue to bladder scan post void. Qshift and PRN   Swallow/Nutrition/ Hydration   not addressing         ADL's   CGA for ambulatory toilet transfers, min A for toileting tasks, bathing, mod A LBD and UBD, Brunstrum RUE II to III, hand IV  S for ADL and transfers  RUE NMR, self-care/transfer/balance retraining, pt/family/AE/DME education, activity tolerance   Mobility   Bed mobility = supervision/ CGA, Transfers = Min A/ CGA, gait = ~50 feet with RW and CGA/ Min A  Mod I for bed mobility and transfers; supervision for gait; CGA for stairs  activity tolerance, NMR for overall balance, transfers, gait, stairs    Communication             Safety/Cognition/ Behavioral Observations  Mod A, max for memory. Cognitive deficits at baseline per daughter s/p CVA in Feb 2022. Increased anxiety and difficulty sleeping reported by daughter.  sup-to-min A  orientation, basic problem solving, recall, compensatory strategies   Pain   c/o pain to left neck incision, refuses prn pain medication  Pain <3  Assess Qshift and PRN   Skin   Left neck closed incision. Right groin closed incision.  Prevention of infection. Promotion of healing.  Assess Qshift and PRN     Discharge Planning:  Patient lives with daughter and son in law. Daugher works during the day, son able to provide 24/7   Team Discussion: Patient post left frontal CVA/Left parietal/occipital IPH with hx of CVA February 2021. MD adjusting BP medications and added potassium supplement to correct deficiency. ABLA; labs trending down; monitoring. Reports right upper extremity pain after ROM. Daughter reoprts increased anxiety however patient has no issues with sleeping. Appears near baseline for cognition.  Patient on target to meet rehab goals: yes, currently CGA for toilet and shower transfers with cues for safety. Needs min assist with a RW due to right inattention and assistance to advance right LE. Completed toileting and lower body care with min assist. Goals for discharge set at mod I overall with supervision for cognition and gait.  *See Care Plan and progress notes for long and short-term goals.   Revisions to  Treatment Plan:  Working on [employer], cognitive tasks, attention, orientation, complex memory strategies.    Teaching Needs: Safety, medications, secondary risk management, etc.   Current Barriers to Discharge: Decreased caregiver support and Home enviroment access/layout  Possible Resolutions to Barriers: Family education     Medical Summary Current Status: Increasing right side weakness secondary to left parietal occipital infarct  secondary to left ICA high risk plaque as well as history of CVA 2021 with right side weakness.  Barriers to Discharge: Medical stability   Possible Resolutions to Barriers/Weekly Focus: Therapies, follow labs- Hb, Cr, K+, bowel movements, bladder function, BP   Continued Need for Acute Rehabilitation Level of Care: The patient requires daily medical management by a physician with specialized training in physical medicine and rehabilitation for the following reasons: Direction of a multidisciplinary physical rehabilitation program to maximize functional independence : Yes Medical management of patient stability for increased activity during participation in an intensive rehabilitation regime.: Yes Analysis of laboratory values and/or radiology reports with any subsequent need for medication adjustment and/or medical intervention. : Yes   I attest that I was present, lead the team conference, and concur with the assessment and plan of the team.   Chana Bode B 06/14/2021, 1:45 PM

## 2021-06-14 NOTE — Progress Notes (Addendum)
Speech Language Pathology Daily Session Note  Patient Details  Name: Denise Leon MRN: [id number] Date of Birth: July 21, 1946  Today's Date: 06/14/2021 SLP Individual Time: 0830-0930 SLP Individual Time Calculation (min): 60 min  Short Term Goals: Week 1: SLP Short Term Goal 1 (Week 1): Patient will demonstrate orientation (to place) given min verbal and visual cues. SLP Short Term Goal 2 (Week 1): Patient will complete mildly complex problem solving given modA verbal cues. SLP Short Term Goal 3 (Week 1): Patient will demonstrate recall of novel information with mod verbal cues with use of compensatory memory strategies  Skilled Therapeutic Interventions: Patient agreeable to skilled ST intervention with focus on cognitive goals. Patient was sitting EOB consuming her morning meal upon arrival. Patient expressed preference to speak in English instead of using video interpreter today. She comprehended well with occasional repetition, which appeared to attribute to hearing loss. Facilitated ADLs at sink including brushing teeth and combing hair. Patient completed with CGA and set-up or oral care. Facilitated education on compensatory memory strategies. At prior level, pt expressed she would occasionally write things down such as birthdays or important dates. Provided patient with pen and paper in attempt to write her name and utilize note taking as compensation for memory, however this was difficult secondary to weakness which resulted in poor legibility. Facilitated basic verbal reasoning task in which patient completed with sup A verbal cues. Facilitated working memory task in which patient completed with min-to-mod A verbal or visual cues. Patient was oriented to person, month, year, and general location (hospital) this date, although was unsure of town and name of hospital. Provided patient with visual containing "you are at [facility] in [place]" across from her bed to aid orientation to  place. Patient was left in bed with alarm activated and immediate needs within reach at end of session. Continue per current plan of care.      Pain Pain Assessment Pain Scale: 0-10 Pain Score: 0-No pain  Therapy/Group: Individual Therapy  Tamala Ser 06/14/2021, 8:41 AM

## 2021-06-14 NOTE — Progress Notes (Signed)
PROGRESS NOTE   Subjective/Complaints: Patient seen standing up, working with therapy this morning.  She states she slept well overnight.  She denies complaints.  ROS: limited due to language  Objective:   No results found. Recent Labs    06/12/21 0558 06/14/21 0516  WBC 8.9 9.5  HGB 9.0* 9.7*  HCT 26.8* 29.1*  PLT 302 363    Recent Labs    06/12/21 0558  NA 137  K 3.3*  CL 105  CO2 27  GLUCOSE 108*  BUN 18  CREATININE 1.03*  CALCIUM 8.4*     Intake/Output Summary (Last 24 hours) at 06/14/2021 1053 Last data filed at 06/14/2021 0342 Gross per 24 hour  Intake 1300 ml  Output 2025 ml  Net -725 ml         Physical Exam: Vital Signs Blood pressure (!) 155/90, pulse 88, temperature 98.1 F (36.7 C), resp. rate 18, height 5\' 4"  (1.626 m), weight 65.2 kg, SpO2 98 %. Constitutional: No distress . Vital signs reviewed. HENT: Normocephalic.  Atraumatic. Eyes: EOMI. No discharge. Cardiovascular: No JVD.  RRR. Respiratory: Normal effort.  No stridor.  Bilateral clear to auscultation. GI: Non-distended.  BS +. Skin: Warm and dry.  Left neck incision with scabbing Psych: Normal mood.  Normal behavior. Musc: No edema in extremities.  No tenderness in extremities. Neuro: Alert Some dysarthria RUE 3-4/5 prox to 3 to 3+ distally. RLE 4-/5 prox to 4/5 distally  Assessment/Plan: 1. Functional deficits which require 3+ hours per day of interdisciplinary therapy in a comprehensive inpatient rehab setting. Physiatrist is providing close team supervision and 24 hour management of active medical problems listed below. Physiatrist and rehab team continue to assess barriers to discharge/monitor patient progress toward functional and medical goals  Care Tool:  Bathing    Body parts bathed by patient: Chest, Abdomen, Front perineal area, Right upper leg, Left upper leg, Face   Body parts bathed by helper: Right arm,  Left arm, Buttocks, Right lower leg, Left lower leg     Bathing assist Assist Level: Moderate Assistance - Patient 50 - 74%     Upper Body Dressing/Undressing Upper body dressing   What is the patient wearing?: Pull over shirt    Upper body assist Assist Level: Moderate Assistance - Patient 50 - 74%    Lower Body Dressing/Undressing Lower body dressing      What is the patient wearing?: Pants     Lower body assist Assist for lower body dressing: Moderate Assistance - Patient 50 - 74%     Toileting Toileting    Toileting assist Assist for toileting: Minimal Assistance - Patient > 75%     Transfers Chair/bed transfer  Transfers assist     Chair/bed transfer assist level: Minimal Assistance - Patient > 75%     Locomotion Ambulation   Ambulation assist      Assist level: Minimal Assistance - Patient > 75% Assistive device: Walker-rolling Max distance: 65   Walk 10 feet activity   Assist     Assist level: Minimal Assistance - Patient > 75% Assistive device: Walker-rolling   Walk 50 feet activity   Assist    Assist level:  Minimal Assistance - Patient > 75% Assistive device: Walker-rolling    Walk 150 feet activity   Assist Walk 150 feet activity did not occur: Safety/medical concerns         Walk 10 feet on uneven surface  activity   Assist Walk 10 feet on uneven surfaces activity did not occur: Safety/medical concerns         Wheelchair     Assist Is the patient using a wheelchair?: Yes Type of Wheelchair: Manual    Wheelchair assist level: Total Assistance - Patient < 25% Max wheelchair distance: 150 ft    Wheelchair 50 feet with 2 turns activity    Assist        Assist Level: Total Assistance - Patient < 25%   Wheelchair 150 feet activity     Assist      Assist Level: Total Assistance - Patient < 25%   Blood pressure (!) 155/90, pulse 88, temperature 98.1 F (36.7 C), resp. rate 18, height 5\' 4"   (1.626 m), weight 65.2 kg, SpO2 98 %.  Medical Problem List and Plan: 1.  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  Impaired mobility: -DVT/anticoagulation:  Mechanical: Sequential compression devices, below knee Bilateral lower extremities             -antiplatelet therapy: continue aspirin 325 mg daily and Plavix 75 mg daily 3. Pain: Continue Oxycodone as needed 4. Depression: Continue Cymbalta 30 mg daily             -antipsychotic agents: N/A 5. Neuropsych: This patient is capable of making decisions on her own behalf. 6. Skin/Wound Care: Routine skin checks 7. Fluids/Electrolytes/Nutrition: encourage PO  Potassium 3.3 on 8/29  Supplement with kdur 9/29 daily 8.  Hyperlipidemia.  Lipitor 9.  CKD stage III.Creatinine baseline 1.10-1.22---stable 8/29 10.  Incidental 2 cm left thyroid nodule.  Follow-up outpatient 11. Hypertension.Norvasc 5mg  daily and Coreg 6.25.mg daily PTA and resume as needed.-  Norvasc restarted, increased to 7.5 on 8/31 Coreg restarted Hydralazine 25 mg q8 hours prn for SBP >180 and DBP >100.  12. Urinary frequency-  U/A negative. and Cx (was (-)--monitor patterns 13. Constipation  Improving 14. ABLA: hgb trending down post-op  -iron supplement  -surgical site appears improved  Hemoglobin 9.7 on 8/31, improving  Continue to monitor   LOS: 4 days A FACE TO FACE EVALUATION WAS PERFORMED  Julie Paolini 9/31 06/14/2021, 10:53 AM

## 2021-06-15 DIAGNOSIS — I63232 Cerebral infarction due to unspecified occlusion or stenosis of left carotid arteries: Secondary | ICD-10-CM | POA: Diagnosis not present

## 2021-06-15 LAB — URINALYSIS, ROUTINE W REFLEX MICROSCOPIC
Bilirubin Urine: NEGATIVE
Glucose, UA: NEGATIVE mg/dL
Ketones, ur: NEGATIVE mg/dL
Nitrite: NEGATIVE
Protein, ur: NEGATIVE mg/dL
Specific Gravity, Urine: 1.009 (ref 1.005–1.030)
pH: 7 (ref 5.0–8.0)

## 2021-06-15 MED ORDER — OXYBUTYNIN CHLORIDE 5 MG PO TABS
5.0000 mg | ORAL_TABLET | Freq: Three times a day (TID) | ORAL | Status: DC
  Administered 2021-06-15 – 2021-06-18 (×10): 5 mg via ORAL
  Filled 2021-06-15 (×11): qty 1

## 2021-06-15 MED ORDER — CEPHALEXIN 250 MG PO CAPS
500.0000 mg | ORAL_CAPSULE | Freq: Two times a day (BID) | ORAL | Status: AC
  Administered 2021-06-15 – 2021-06-21 (×14): 500 mg via ORAL
  Filled 2021-06-15 (×14): qty 2

## 2021-06-15 MED ORDER — AMLODIPINE BESYLATE 10 MG PO TABS
10.0000 mg | ORAL_TABLET | Freq: Every day | ORAL | Status: DC
  Administered 2021-06-16 – 2021-06-24 (×9): 10 mg via ORAL
  Filled 2021-06-15 (×9): qty 1

## 2021-06-15 MED ORDER — OXYCODONE HCL 5 MG PO TABS: 5.0000 mg | ORAL_TABLET | Freq: Four times a day (QID) | ORAL | Status: DC | PRN

## 2021-06-15 NOTE — Progress Notes (Signed)
PROGRESS NOTE   Subjective/Complaints: Urinating very frequently and has high volume bladder scans- UA checked and is positive, UC pending- started Keflex. BMP ordered for tomorrow given AKI.   ROS: Limited due to language  Objective:   No results found. Recent Labs    06/14/21 0516  WBC 9.5  HGB 9.7*  HCT 29.1*  PLT 363   No results for input(s): NA, K, CL, CO2, GLUCOSE, BUN, CREATININE, CALCIUM in the last 72 hours.  Intake/Output Summary (Last 24 hours) at 06/15/2021 1303 Last data filed at 06/15/2021 1244 Gross per 24 hour  Intake 730 ml  Output 1410 ml  Net -680 ml        Physical Exam: Vital Signs Blood pressure (!) 155/92, pulse 85, temperature 98.3 F (36.8 C), resp. rate 18, height 5\' 4"  (1.626 m), weight 65.2 kg, SpO2 97 %. Gen: no distress, normal appearing HEENT: oral mucosa pink and moist, NCAT Cardio: Reg rate Chest: normal effort, normal rate of breathing Abd: soft, non-distended Ext: no edema Psych: pleasant, normal affect Skin: Warm and dry.  Left neck incision with scabbing Psych: Normal mood.  Normal behavior. Musc: No edema in extremities.  No tenderness in extremities. Neuro: Alert Some dysarthria RUE 3-4/5 prox to 3 to 3+ distally. RLE 4-/5 prox to 4/5 distally  Assessment/Plan: 1. Functional deficits which require 3+ hours per day of interdisciplinary therapy in a comprehensive inpatient rehab setting. Physiatrist is providing close team supervision and 24 hour management of active medical problems listed below. Physiatrist and rehab team continue to assess barriers to discharge/monitor patient progress toward functional and medical goals  Care Tool:  Bathing    Body parts bathed by patient: Chest, Abdomen, Front perineal area, Right upper leg, Left upper leg, Face   Body parts bathed by helper: Right arm, Left arm, Buttocks, Right lower leg, Left lower leg     Bathing assist  Assist Level: Moderate Assistance - Patient 50 - 74%     Upper Body Dressing/Undressing Upper body dressing   What is the patient wearing?: Pull over shirt    Upper body assist Assist Level: Moderate Assistance - Patient 50 - 74%    Lower Body Dressing/Undressing Lower body dressing      What is the patient wearing?: Pants     Lower body assist Assist for lower body dressing: Moderate Assistance - Patient 50 - 74%     Toileting Toileting    Toileting assist Assist for toileting: Minimal Assistance - Patient > 75%     Transfers Chair/bed transfer  Transfers assist     Chair/bed transfer assist level: Minimal Assistance - Patient > 75%     Locomotion Ambulation   Ambulation assist      Assist level: Minimal Assistance - Patient > 75% Assistive device: Walker-rolling Max distance: 65   Walk 10 feet activity   Assist     Assist level: Minimal Assistance - Patient > 75% Assistive device: Walker-rolling   Walk 50 feet activity   Assist    Assist level: Minimal Assistance - Patient > 75% Assistive device: Walker-rolling    Walk 150 feet activity   Assist Walk 150 feet activity did  not occur: Safety/medical concerns         Walk 10 feet on uneven surface  activity   Assist Walk 10 feet on uneven surfaces activity did not occur: Safety/medical concerns         Wheelchair     Assist Is the patient using a wheelchair?: Yes Type of Wheelchair: Manual    Wheelchair assist level: Total Assistance - Patient < 25% Max wheelchair distance: 150 ft    Wheelchair 50 feet with 2 turns activity    Assist        Assist Level: Total Assistance - Patient < 25%   Wheelchair 150 feet activity     Assist      Assist Level: Total Assistance - Patient < 25%   Blood pressure (!) 155/92, pulse 85, temperature 98.3 F (36.8 C), resp. rate 18, height 5\' 4"  (1.626 m), weight 65.2 kg, SpO2 97 %.  Medical Problem List and Plan: 1.   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  Impaired mobility: -DVT/anticoagulation:  Mechanical: Sequential compression devices, below knee Bilateral lower extremities             -antiplatelet therapy: Continue aspirin 325 mg daily and Plavix 75 mg daily 3. Pain: Decrease Oxycodone to 5mg  q6H as needed 4. Depression: Continue Cymbalta 30 mg daily             -antipsychotic agents: N/A 5. Neuropsych: This patient is capable of making decisions on her own behalf. 6. Skin/Wound Care: Routine skin checks 7. Fluids/Electrolytes/Nutrition: encourage PO  Potassium 3.3 on 8/29  Supplement with kdur daily 8.  Hyperlipidemia.  Lipitor 9.  CKD stage III.Creatinine baseline 1.10-1.22---stable 8/29 10.  Incidental 2 cm left thyroid nodule.  Follow-up outpatient 11. Hypertension.Norvasc 5mg  daily and Coreg 6.25.mg daily PTA and resume as needed.-  Norvasc restarted, increased to 7.5 on 8/31 Coreg restarted Hydralazine 25 mg q8 hours prn for SBP >180 and DBP >100.  12. Urinary frequency-  UA positive- start Keflex. F/u UC 13. Constipation  Improving 14. ABLA: hgb trending down post-op  -iron supplement  -surgical site appears improved  Hemoglobin 9.7 on 8/31, improving  Continue to monitor   LOS: 5 days A FACE TO FACE EVALUATION WAS PERFORMED  Tyrica Afzal P Danil Wedge 06/15/2021, 1:03 PM

## 2021-06-15 NOTE — Progress Notes (Signed)
Inpatient Rehabilitation Care Coordinator Assessment and Plan Patient Details  Name: Denise Leon MRN: [id number] Date of Birth: December 13, 1945  Today's Date: 06/15/2021  Hospital Problems: Principal Problem:   CVA (cerebral vascular accident) [facility]) Active Problems:   Acute ischemic stroke ([place]) patchy L frontal infarcts s/p tPA   CKD (chronic kidney disease), stage IIIa   Occlusion of carotid stent, subsequent encounter   Acute blood loss anemia   Hypokalemia  Past Medical History:  Past Medical History:  Diagnosis Date   Acute ischemic stroke ([place]) patchy L frontal infarcts s/p tPA 11/14/2019   Asthma    CKD (chronic kidney disease), stage IIIb 11/16/2019   Hyperlipidemia 11/16/2019   Hypertension    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   Palpitations 11/16/2019   Stroke ([place]) 11/14/2019   Thyroid nodule, L 11/16/2019   Past Surgical History:  Past Surgical History:  Procedure Laterality Date   HIP SURGERY     NECK SURGERY     TRANSCAROTID ARTERY REVASCULARIZATION  Left 06/08/2021   Procedure: LEFT TRANSCAROTID ARTERY REVASCULARIZATION;  Surgeon: Waynetta Sandy, MD;  Location: [place];  Service: Vascular;  Laterality: Left;   ULTRASOUND GUIDANCE FOR VASCULAR ACCESS Right 06/08/2021   Procedure: ULTRASOUND GUIDANCE FOR VASCULAR ACCESS, RIGHT FEMORAL VEIN;  Surgeon: Waynetta Sandy, MD;  Location: [place];  Service: Vascular;  Laterality: Right;   Social History:  reports that she has never smoked. She has never used smokeless tobacco. She reports that she does not drink alcohol and does not use drugs.  Family / Support Systems Marital Status: Single Children: Flordeluna Morrell-Gotting Other Supports: daughter and son in law Anticipated Caregiver: Daughter and son in law Ability/Limitations of Caregiver: Daughter works during the day, son works nights Caregiver Availability: 24/7 Family Dynamics: some support from children  Social History Preferred  language: English Religion:  Cultural Background: TAGALOG, some [place] - How often do you need to have someone help you when you read instructions, pamphlets, or other written material from your doctor or pharmacy?: Patient unable to respond Legal History/Current Legal Issues: n/a Guardian/Conservator: n/a   Abuse/Neglect Abuse/Neglect Assessment Can Be Completed: Unable to assess, patient is non-responsive or altered mental status Physical Abuse: Denies Verbal Abuse: Denies Sexual Abuse: Denies Exploitation of patient/patient's resources: Denies Self-Neglect: Denies  Patient response to: Social Isolation - How often do you feel lonely or isolated from those around you?: Patient unable to respond  Emotional Status Pt's affect, behavior and adjustment status: n/a Recent Psychosocial Issues: n/a Psychiatric History: n/a Substance Abuse History: n/a  Patient / Family Perceptions, Expectations & Goals Pt/Family understanding of illness & functional limitations: yes Premorbid pt/family roles/activities: ambulating with RW with close supervision. Some assistance with ADLS Anticipated changes in roles/activities/participation: Familyto provide some assistance Pt/family expectations/goals: Supervision  [place] Agencies: None Premorbid Home Care/DME Agencies: Other (Comment) Librarian, academic) Transportation available at discharge: Family able to transport Is the patient able to respond to transportation needs?: Yes In the past 12 months, has lack of transportation kept you from medical appointments or from getting medications?: No In the past 12 months, has lack of transportation kept you from meetings, work, or from getting things needed for daily living?: No  Discharge Planning Living Arrangements: Children Support Systems: Children Type of Residence: Private residence (2 level home, able to maintain on main level) Administrator, sports:  Information systems manager, [employer] (specify county) Museum/gallery curator Resources: Family Support Financial Screen Referred: No Living Expenses: Lives with family Money Management: Patient  Does the patient have any problems obtaining your medications?: No Home Management: Some assistance Patient/Family Preliminary Plans: Family to continue Care Coordinator Anticipated Follow Up Needs: HH/OP Expected length of stay: 10-12 Days  Clinical Impression SW met with patient, introduced self, explained role and addressed questions and concerns. Pt will return home with daughter. No additional questions or concerns  Dyanne Iha 06/15/2021, 11:35 AM

## 2021-06-15 NOTE — Progress Notes (Signed)
Physical Therapy Session Note  Patient Details  Name: Denise Leon MRN: [id number] Date of Birth: 1946-06-22  Today's Date: 06/15/2021 PT Individual Time: 6015-6153 PT Individual Time Calculation (min): 53 min   Short Term Goals: Week 1:  PT Short Term Goal 1 (Week 1): Patient will perform bed mobility with supervision. PT Short Term Goal 2 (Week 1): Patient will perform basic transfers with supervision using LRAD. PT Short Term Goal 3 (Week 1): Patient will ambulate >100 feet with CGA using LRAD. Week 2:     Skilled Therapeutic Interventions/Progress Updates:   Pt received supine in bed and agreeable to PT. Supine>sit transfer with mod assist and cues for use of rail as needed. Stand pivot transfer to [facility] with min assist and RW. Pt transported to entrance of [place].   Gait training over cement sidewalk with RW 2 x 31f with min assist and cues for increased step length on the RLE as well as improved posture when fatigued.   [facility] and [facility] for use of BUE to push from arm rest, but pt continued to push from RW.   Seated NMR: SLR, hip flexion, hip abduction, ankle PF/DF, all performed x 10-12 AROM with cues for attention to the RLE to improve ROM into hip adbduction and flexion. Pt unable perform against manual resistance due to language barrier.   Pt reports need for urnination. [facility] Pt able to void once sitting on toilet. Sit>stand from toilet with mod assist and UE supported on rail. Pt performed peri care with min assist for balance. Clothing management performed by PT. Pt returned to room and performed ambulatory transfer to bed with RW and min A. Sit>supine completed with min A on the RLE, and left supine in bed with call bell in reach and all needs met.       Therapy Documentation Precautions:  Precautions Precautions: Fall Precaution Comments: HoH better hearing in her Rt ear,  speaks Tagalog and some English Restrictions Weight Bearing Restrictions: No General: PT Amount of Missed Time (min): 22 Minutes Vital Signs: Therapy Vitals Pulse Rate: 71 BP: 127/62 Pain:  Denies    Therapy/Group: Individual Therapy  ALorie Phenix9/10/2020, 5:41 PM

## 2021-06-15 NOTE — Progress Notes (Signed)
Occupational Therapy Session Note  Patient Details  Name: Denise Leon MRN: [id number] Date of Birth: 13-May-1946  Today's Date: 06/15/2021 OT Individual Time: 1004-1057 OT Individual Time Calculation (min): 53 min    Short Term Goals: Week 1:  OT Short Term Goal 1 (Week 1): Pt will complete LB dressing with Mod A OT Short Term Goal 2 (Week 1): Pt will complete ambulatory toilet transfer using RW with no more than CGA OT Short Term Goal 3 (Week 1): Pt will complete a shower transfer with CGA using LRAD  Skilled Therapeutic Interventions/Progress Updates:    Pt received seated in w/c with SLP present, agreeable to therapy, but declines changing clothes/shower/need to toilet. Session focus on self-care retraining, activity tolerance, RUE NMR, func transfers in prep for improved ADL/IADL/func mobility performance + decreased caregiver burden. Total A w/c transport to and from gym 2/2 time management and energy conservation. Seated at [facility], completed 2 rounds of Single target game with overall 6.23 sec reaction time + tracing shapes with overall 71.45% accuracy, req mod A from therapist to support RUE + facilitate R shoulder flexion/elbow extension. Compensates with trunk flexion to reach screen despite multimodal cuing. C/o ongoing RUE pain, especially when palpated at R humeral head, noted inferior subluxation. May trial estim in future sessions to approximate joint and facilitate pain relief. Additionally, pt able to copy 2 level 2 difficulty peg designs using RUE, req min A from therapist to support RUE and achieve grasp. Req table be placed at knee level in order to reach + max A from therapist to accurately complete designs. Amb ~ 30 ft back towards room with CGA + RW before req to sit down. Short ambulatory transfer > bed and mod A to return to supine.    Pt left semi-reclined in bed, hemi side protected, with bed alarm engaged, call bell in reach, and all immediate needs met.    Therapy  Documentation Precautions:  Precautions Precautions: Fall Precaution Comments: HoH better hearing in her Rt ear, speaks Tagalog and some English Restrictions Weight Bearing Restrictions: No   Pain: ongoing RUE pain, reports worse with active use/ at shoulder   ADL: See Care Tool for more details. Therapy/Group: Individual Therapy  Volanda Napoleon MS, OTR/L  06/15/2021, 6:38 AM

## 2021-06-15 NOTE — Progress Notes (Signed)
Patient ID: Denise Leon, female   DOB: 1946-02-23, 75 y.o.   MRN: [id number] Team Conference Report to Patient/Family  Team Conference discussion was reviewed with the patient and caregiver, including goals, any changes in plan of care and target discharge date.  Patient and caregiver express understanding and are in agreement.  The patient has a target discharge date of 06/24/21.   SW provided pt daughter with conference updates. Daughter requesting updates about pt wound care. No additional questions or concerns, sw will continue to follow up [name] 06/15/2021, 10:35 AM

## 2021-06-15 NOTE — Progress Notes (Signed)
Speech Language Pathology Daily Session Note  Patient Details  Name: Denise Leon MRN: [id number] Date of Birth: 01-16-1946  Today's Date: 06/15/2021 SLP Individual Time: 0900-1000 SLP Individual Time Calculation (min): 60 min  Short Term Goals: Week 1: SLP Short Term Goal 1 (Week 1): Patient will demonstrate orientation (to place) given min verbal and visual cues. SLP Short Term Goal 2 (Week 1): Patient will complete mildly complex problem solving given modA verbal cues. SLP Short Term Goal 3 (Week 1): Patient will demonstrate recall of novel information with mod verbal cues with use of compensatory memory strategies  Skilled Therapeutic Interventions: Patient agreeable to skilled ST intervention with focus on cognitive goals. SLP facilitated oral care at sink with CGA and set-up assist. Pt required increased cueing for initiation today although important to consider the possible influence of language differences. Facilitated orientation in which patient was oriented to person, and situation at independent level, mod I for place and time by referring to orientation visual in room. Facilitated working memory and mildly complex problem solving skills with novel card game "UNO". Patient completed task initially with max A cues fading to mod A verbal and visual cues including consistent repetition of task instructions, explicit verbal cues to aid in processing, and use of visual reminder containing task instructions in which patient was eventually able to refer to with sup A verbal cues. Facilitated completion of task during 4 occasions considering this was a new task/concept for patient. She displayed limited carry over until the 4th presentation. Facilitated 5 trials of spaced retrieval therapy (SRT) involving recall of 3 common objects that were placed in front of patient following 30 second delay. Trial 1: 0/3 recalled (progressed to 1/3 w/ semantic cue) trial 2: 0/3 recalled (progressed to 3/3 w/  semantic cues), trial 3: 2/3 recalled (progressed to 3/3 w/ semantic cue), trial 4: 2/3 recalled (progressed to 3/3 w/ semantic cue), and trial 5: 3/3 recalled. Patient continues to exhibit limited attention working memory and unclear if this is different from baseline (daughter endorsed short-term recall deficits at baseline but unable to comment on the extent of impairment. Patient was passed off to OT at end of session. Continue per current plan of care.      Pain Pain Assessment Pain Scale: 0-10 Pain Score: 0-No pain  Therapy/Group: Individual Therapy  Tamala Ser 06/15/2021, 9:59 AM

## 2021-06-16 DIAGNOSIS — I63232 Cerebral infarction due to unspecified occlusion or stenosis of left carotid arteries: Secondary | ICD-10-CM | POA: Diagnosis not present

## 2021-06-16 DIAGNOSIS — N390 Urinary tract infection, site not specified: Secondary | ICD-10-CM

## 2021-06-16 DIAGNOSIS — E876 Hypokalemia: Secondary | ICD-10-CM | POA: Diagnosis not present

## 2021-06-16 DIAGNOSIS — N1831 Chronic kidney disease, stage 3a: Secondary | ICD-10-CM | POA: Diagnosis not present

## 2021-06-16 DIAGNOSIS — I639 Cerebral infarction, unspecified: Secondary | ICD-10-CM | POA: Diagnosis not present

## 2021-06-16 LAB — BASIC METABOLIC PANEL
Anion gap: 8 (ref 5–15)
BUN: 20 mg/dL (ref 8–23)
CO2: 28 mmol/L (ref 22–32)
Calcium: 9 mg/dL (ref 8.9–10.3)
Chloride: 101 mmol/L (ref 98–111)
Creatinine, Ser: 1.2 mg/dL — ABNORMAL HIGH (ref 0.44–1.00)
GFR, Estimated: 48 mL/min — ABNORMAL LOW (ref >60)
Glucose, Bld: 112 mg/dL — ABNORMAL HIGH (ref 70–99)
Potassium: 3.9 mmol/L (ref 3.5–5.1)
Sodium: 137 mmol/L (ref 135–145)

## 2021-06-16 NOTE — Progress Notes (Signed)
PROGRESS NOTE   Subjective/Complaints: Patient seen standing up, ambulating with therapy this morning with rolling walker.  She states she slept well overnight.  She denies complaints.  ROS: Limited due to language  Objective:   No results found. Recent Labs    06/14/21 0516  WBC 9.5  HGB 9.7*  HCT 29.1*  PLT 363    Recent Labs    06/16/21 0452  NA 137  K 3.9  CL 101  CO2 28  GLUCOSE 112*  BUN 20  CREATININE 1.20*  CALCIUM 9.0    Intake/Output Summary (Last 24 hours) at 06/16/2021 1027 Last data filed at 06/16/2021 0826 Gross per 24 hour  Intake 600 ml  Output 1255 ml  Net -655 ml         Physical Exam: Vital Signs Blood pressure 136/90, pulse 72, temperature 97.8 F (36.6 C), temperature source Oral, resp. rate 18, height 5\' 4"  (1.626 m), weight 65.2 kg, SpO2 99 %. Constitutional: No distress . Vital signs reviewed. HENT: Normocephalic.  Left neck incision with dried blood. Eyes: EOMI. No discharge. Cardiovascular: No JVD.  RRR. Respiratory: Normal effort.  No stridor.  Bilateral clear to auscultation. GI: Non-distended.  BS +. Skin: Warm and dry.  Intact.  Left neck incision healing Psych: Normal mood.  Normal behavior. Musc: No edema in extremities.  No tenderness in extremities. Neuro: Alert Some dysarthria, stable RUE 3-4/5 prox to 3 to 3+ distally. RLE 4-/5 prox to 4/5 distally  Assessment/Plan: 1. Functional deficits which require 3+ hours per day of interdisciplinary therapy in a comprehensive inpatient rehab setting. Physiatrist is providing close team supervision and 24 hour management of active medical problems listed below. Physiatrist and rehab team continue to assess barriers to discharge/monitor patient progress toward functional and medical goals  Care Tool:  Bathing    Body parts bathed by patient: Chest, Abdomen, Front perineal area, Right upper leg, Left upper leg, Face    Body parts bathed by helper: Right arm, Left arm, Buttocks, Right lower leg, Left lower leg     Bathing assist Assist Level: Moderate Assistance - Patient 50 - 74%     Upper Body Dressing/Undressing Upper body dressing   What is the patient wearing?: Pull over shirt    Upper body assist Assist Level: Moderate Assistance - Patient 50 - 74%    Lower Body Dressing/Undressing Lower body dressing      What is the patient wearing?: Pants     Lower body assist Assist for lower body dressing: Moderate Assistance - Patient 50 - 74%     Toileting Toileting    Toileting assist Assist for toileting: Minimal Assistance - Patient > 75%     Transfers Chair/bed transfer  Transfers assist     Chair/bed transfer assist level: Minimal Assistance - Patient > 75%     Locomotion Ambulation   Ambulation assist      Assist level: Minimal Assistance - Patient > 75% Assistive device: Walker-rolling Max distance: 65   Walk 10 feet activity   Assist     Assist level: Minimal Assistance - Patient > 75% Assistive device: Walker-rolling   Walk 50 feet activity   Assist  Assist level: Minimal Assistance - Patient > 75% Assistive device: Walker-rolling    Walk 150 feet activity   Assist Walk 150 feet activity did not occur: Safety/medical concerns         Walk 10 feet on uneven surface  activity   Assist Walk 10 feet on uneven surfaces activity did not occur: Safety/medical concerns         Wheelchair     Assist Is the patient using a wheelchair?: Yes Type of Wheelchair: Manual    Wheelchair assist level: Total Assistance - Patient < 25% Max wheelchair distance: 150 ft    Wheelchair 50 feet with 2 turns activity    Assist        Assist Level: Total Assistance - Patient < 25%   Wheelchair 150 feet activity     Assist      Assist Level: Total Assistance - Patient < 25%   Blood pressure 136/90, pulse 72, temperature 97.8 F (36.6  C), temperature source Oral, resp. rate 18, height 5\' 4"  (1.626 m), weight 65.2 kg, SpO2 99 %.  Medical Problem List and Plan: 1.  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  Impaired mobility: -DVT/anticoagulation:  Mechanical: Sequential compression devices, below knee Bilateral lower extremities             -antiplatelet therapy: Continue aspirin 325 mg daily and Plavix 75 mg daily 3. Pain: Decrease Oxycodone to 5mg  q6H as needed 4. Depression: Continue Cymbalta 30 mg daily             -antipsychotic agents: N/A 5. Neuropsych: This patient is capable of making decisions on her own behalf. 6. Skin/Wound Care: Routine skin checks 7. Fluids/Electrolytes/Nutrition: encourage PO  Potassium 3.9 on 9/2 Supplement with kdur daily 8.  Hyperlipidemia.  Lipitor 9.  CKD stage III.Creatinine baseline 1.10-1.22  Creatinine 1.20 on 9/2 10.  Incidental 2 cm left thyroid nodule.  Follow-up outpatient 11. Hypertension.Norvasc 5mg  daily and Coreg 6.25.mg daily PTA and resume as needed.-  Norvasc restarted, increased to 7.5 on 8/31 Coreg restarted Hydralazine 25 mg q8 hours prn for SBP >180 and DBP >100.  Controlled on 9/2 12.  Acute lower UTI-  UA positive- start empiric Keflex.  Urine culture showing greater than 100,000 E. coli, sensitivities pending 13. Constipation  Improving 14. ABLA: hgb trending down post-op  -iron supplement  -surgical site appears improved  Hemoglobin 9.7 on 8/31, improving  Continue to monitor   LOS: 6 days A FACE TO FACE EVALUATION WAS PERFORMED  Eisa Conaway 9/31 06/16/2021, 10:27 AM

## 2021-06-16 NOTE — Progress Notes (Signed)
Physical Therapy Session Note  Patient Details  Name: Denise Leon MRN: [id number] Date of Birth: [birth year]  Today's Date: 06/16/2021 PT Individual Time: 4259-5638 PT Individual Time Calculation (min): 42 min   Short Term Goals: Week 1:  PT Short Term Goal 1 (Week 1): Patient will perform bed mobility with supervision. PT Short Term Goal 2 (Week 1): Patient will perform basic transfers with supervision using LRAD. PT Short Term Goal 3 (Week 1): Patient will ambulate >100 feet with CGA using LRAD.  Skilled Therapeutic Interventions/Progress Updates:  Patient sidelying toward window in bed on entrance to room. Patient asleep and requires minimal time to become alert and agreeable to PT session.  Therapeutic Activity: Bed Mobility: Patient performed supine --> sit with HHA to RUE to complete with CGA. VC/ tc required for effort and technique. For return to supine, pt is able to perform with close supervision and setup of bed linens. Transfers: Ambulatory transfer to toilet from EOB with CGA and minimal vc for managing RW up floor transition to bathroom and completing pivot turn. Pt requires Min A for clothing mgmt this session.   Gait Training:  Patient ambulated 9' x1/ 59' x1/ 25' x1 feet using RW with CGA and w/c follow for fatigue. Demonstrated decreased step height/ length bilaterally and requires vc for increasing heel strike with R foot to improve overall quality of gait. Final two bouts with focus on high knee production with each step along with heel strike. Pt is able to produce high knee and requires continuous vc for heel strike. Rest breaks between bouts for fatigue and pt c/o of numbness.  Neuromuscular Re-ed: NMR facilitated during session with focus on LE muscle activation, midline orientation during transfers, increased proprioception of bodily positioning/ weight shifting. Pt guided in transfer training with focus on sit <>stand and stand pvt transfer training with decreased  use of BUE. NDT techniques used with tc facilitation at pelvis and ribcage for upright posture and appropriate forward lean for weight shift and rise to stand using BLE only with Bil hands on thighs. Improved posture in rise to stand. Pt continues to demo fear in descent to sit without one hand reaching back for armrest. Continued NDT facilitation for forward lean and rotation of pelvis to improve technique for descent to sit. Pivot transfers performed with no AD and support provided at ribcage for upright posture and improved stepping with reduced grabbing for UE support. W/c cushion removed for performing transfers from lower seat height with good results overall. NMR performed for improvements in motor control and coordination, balance, sequencing, judgement, and self confidence/ efficacy in performing all aspects of mobility at highest level of independence.   Some crepitus felt in L hip with transfers and pt relates hx of past hip replacement surgery.  Patient supine  in bed at end of session with brakes locked, bed alarm set, and all needs within reach. Oriented to current time and time of next therapy session.      Therapy Documentation Precautions:  Precautions Precautions: Fall Precaution Comments: R hemi with c/o n/t; HOH with R better than L, speaks some English Restrictions Weight Bearing Restrictions: No  Vital Signs: Therapy Vitals Pulse Rate: 72 Resp: 18 BP: 136/90 Patient Position (if appropriate): Sitting Oxygen Therapy SpO2: 99 % O2 Device: Room Air  Pain: Patient with no pain complaint throughout session, but does c/o numbness in RUE and RLE at start of session and then Bil UE and LE by end of session. MD notified.  Therapy/Group: Individual Therapy  Loel Dubonnet 06/16/2021, 7:54 AM

## 2021-06-16 NOTE — Progress Notes (Signed)
Occupational Therapy Session Note  Patient Details  Name: Denise Leon MRN: [id number] Date of Birth: Mar 04, 1946  Today's Date: 06/16/2021 OT Individual Time: 9784-7841 OT Individual Time Calculation (min): 53 min    Short Term Goals: Week 1:  OT Short Term Goal 1 (Week 1): Pt will complete LB dressing with Mod A OT Short Term Goal 2 (Week 1): Pt will complete ambulatory toilet transfer using RW with no more than CGA OT Short Term Goal 3 (Week 1): Pt will complete a shower transfer with CGA using LRAD  Skilled Therapeutic Interventions/Progress Updates:    Pt received semi-reclined in bed, reports RUE pain is "ok", agreeable to therapy. Session focus on self-care retraining, activity tolerance, RUE NMR, func transfers, dynamic standing balance in prep for improved ADL/IADL/func mobility performance + decreased caregiver burden. Pt cont to decline shower, but agreeable to change clothes as she had a stain on her shirt. Came to sitting EOB with min A to progress LLE off bed + lift trunk. Doffed shirt with close S + cues for initiation. Donned shirt with min A to thread RUE. Doffed pants with CGA for STS. Donned new pants with min A to initiate LLE threading + pull up pants in the back. Total A to don shoes. Amb > toilet with CGA to min A, poor R foot clearance this date. Req mod A to pull pants down over hips, cont void of urine, close S for seated pericare. STS with min A from low height toilet and amb > sink with CGA. Completed oral care with CGA, noted difficulty bringing R knee into extension and relying on BUE support on counter for balance. PA present momentarily, confirms that pt does not have to cover L neck incision in shower anymore, will confirm with neuro team if pt is able to use estim on R shoulder sublux in setting of recently placed loop recorder. Applied K tape to R shoulder to support sublux and facilitate pain decrease. Denies skin irritation at time of application. Attempted to place  push pins with RUE into cork board design, pt reports increase in R thumb pain so activity ceased. Pt is however, able to remove pins with RUE. Short ambulatory transfer > recliner with CGA  + RW.   Pt left seated in recliner with NT present, chair alarm engaged, call bell in reach, and all immediate needs met.    Therapy Documentation Precautions:  Precautions Precautions: Fall Precaution Comments: HoH better hearing in her Rt ear, speaks Tagalog and some English Restrictions Weight Bearing Restrictions: No  Pain:  Ongoing RUE pain, R thumb pain with activity, did not rate ADL: See Care Tool for more details.   Therapy/Group: Individual Therapy  Volanda Napoleon MS, OTR/L  06/16/2021, 6:38 AM

## 2021-06-16 NOTE — Progress Notes (Signed)
Speech Language Pathology Daily Session Note  Patient Details  Name: Denise Leon MRN: [id number] Date of Birth: December 31, 1945  Today's Date: 06/16/2021 SLP Individual Time: 1455-1540 SLP Individual Time Calculation (min): 45 min  Short Term Goals: Week 1: SLP Short Term Goal 1 (Week 1): Patient will demonstrate orientation (to place) given min verbal and visual cues. SLP Short Term Goal 2 (Week 1): Patient will complete mildly complex problem solving given modA verbal cues. SLP Short Term Goal 3 (Week 1): Patient will demonstrate recall of novel information with mod verbal cues with use of compensatory memory strategies  Skilled Therapeutic Interventions:   Patient seen for skilled ST session focusing on cognitive goals. Patient demonstrated awareness to fact that "I had a stroke", that she was in the hospital. When asked about date, she looked at information board in front of her and read the discharge date "September 10th". SLP provided calendar and reviewed with patient. She was 75% accurate for delayed recall of picture card, with 2-choice cue. After SLP demonstrated novel task of determining words when several letters missing but with category cue, patient then answered 5/10 without assistance and answered all others when SLP provided cues. When asked, patient did report that she had weakness on right side from stroke.She continues to benefit from skilled SLP intervention to maximize cognitive-linguistic goals prior to discharge.  Pain Pain Assessment Pain Scale: 0-10 Pain Score: 0-No pain  Therapy/Group: Individual Therapy  Angela Nevin, MA, CCC-SLP Speech Therapy

## 2021-06-16 NOTE — Progress Notes (Signed)
Physical Therapy Session Note  Patient Details  Name: Denise Leon MRN: [id number] Date of Birth: 05-03-1946  Today's Date: 06/16/2021 PT Individual Time:  -      Short Term Goals: Week 1:  PT Short Term Goal 1 (Week 1): Patient will perform bed mobility with supervision. PT Short Term Goal 2 (Week 1): Patient will perform basic transfers with supervision using LRAD. PT Short Term Goal 3 (Week 1): Patient will ambulate >100 feet with CGA using LRAD.  Skilled Therapeutic Interventions/Progress Updates:   Pt received sitting in WC and agreeable to PT. Stand pivot transfer to [facility] with CGA and RW  Nustep reciprocal movement training 4 min and 2 min with cues for attention to the RUE to complete full ROM   Transfers performed for sit<>stand and stand pivot with min assist throughout session and cues for improved step length on the R  Forward/lateral foot taps on 2 inch step in parallel bars 2 x 6 BLE for each with min  assist and multimodal cues for improved posture and increased step height on the R  Gait training in hall with RW and CGA x 50f. Cues for direction and improved step length intermittently on the R.   Patient returned to room and performed stand pivot to recliner with CGA. Pt left sitting in recliner with call bell in reach and all needs met.           Therapy Documentation Precautions:  Precautions Precautions: Fall Precaution Comments: HoH better hearing in her Rt ear, speaks Tagalog and some English Restrictions Weight Bearing Restrictions: No    Pain: Pain Assessment Pain Scale: 0-10 Pain Score: 0-No pain     Therapy/Group: Individual Therapy  ALorie Phenix9/11/2020, 6:24 PM

## 2021-06-17 DIAGNOSIS — I63232 Cerebral infarction due to unspecified occlusion or stenosis of left carotid arteries: Secondary | ICD-10-CM | POA: Diagnosis not present

## 2021-06-17 LAB — URINE CULTURE: Culture: >=100000 — AB

## 2021-06-17 MED ORDER — OXYCODONE HCL 5 MG PO TABS: 5.0000 mg | ORAL_TABLET | Freq: Three times a day (TID) | ORAL | Status: DC | PRN

## 2021-06-17 NOTE — Progress Notes (Signed)
PROGRESS NOTE   Subjective/Complaints: No complaints  ROS: Limited due to language  Objective:   No results found. No results for input(s): WBC, HGB, HCT, PLT in the last 72 hours. Recent Labs    06/16/21 0452  NA 137  K 3.9  CL 101  CO2 28  GLUCOSE 112*  BUN 20  CREATININE 1.20*  CALCIUM 9.0    Intake/Output Summary (Last 24 hours) at 06/17/2021 1640 Last data filed at 06/17/2021 0745 Gross per 24 hour  Intake 440 ml  Output --  Net 440 ml        Physical Exam: Vital Signs Blood pressure 114/71, pulse 63, temperature 98.9 F (37.2 C), temperature source Oral, resp. rate 17, height 5\' 4"  (1.626 m), weight 65.2 kg, SpO2 97 %. Constitutional: No distress . Vital signs reviewed. HENT: Normocephalic.  Left neck incision with dried blood. Eyes: EOMI. No discharge. Cardiovascular: No JVD.  RRR. Respiratory: Normal effort.  No stridor.  Bilateral clear to auscultation. GI: Non-distended.  BS +. Skin: Warm and dry.  Intact.  Left neck incision healing Psych: Normal mood.  Normal behavior. Musc: No edema in extremities.  No tenderness in extremities. Neuro: Alert, impaired recall Some dysarthria, stable RUE 3-4/5 prox to 3 to 3+ distally. RLE 4-/5 prox to 4/5 distally  Assessment/Plan: 1. Functional deficits which require 3+ hours per day of interdisciplinary therapy in a comprehensive inpatient rehab setting. Physiatrist is providing close team supervision and 24 hour management of active medical problems listed below. Physiatrist and rehab team continue to assess barriers to discharge/monitor patient progress toward functional and medical goals  Care Tool:  Bathing    Body parts bathed by patient: Chest, Abdomen, Front perineal area, Right upper leg, Left upper leg, Face   Body parts bathed by helper: Right arm, Left arm, Buttocks, Right lower leg, Left lower leg     Bathing assist Assist Level: Moderate  Assistance - Patient 50 - 74%     Upper Body Dressing/Undressing Upper body dressing   What is the patient wearing?: Pull over shirt    Upper body assist Assist Level: Minimal Assistance - Patient > 75%    Lower Body Dressing/Undressing Lower body dressing      What is the patient wearing?: Pants     Lower body assist Assist for lower body dressing: Moderate Assistance - Patient 50 - 74%     Toileting Toileting    Toileting assist Assist for toileting: Minimal Assistance - Patient > 75%     Transfers Chair/bed transfer  Transfers assist     Chair/bed transfer assist level: Minimal Assistance - Patient > 75%     Locomotion Ambulation   Ambulation assist      Assist level: Minimal Assistance - Patient > 75% Assistive device: Walker-rolling Max distance: 65   Walk 10 feet activity   Assist     Assist level: Minimal Assistance - Patient > 75% Assistive device: Walker-rolling   Walk 50 feet activity   Assist    Assist level: Minimal Assistance - Patient > 75% Assistive device: Walker-rolling    Walk 150 feet activity   Assist Walk 150 feet activity did not occur:  Safety/medical concerns         Walk 10 feet on uneven surface  activity   Assist Walk 10 feet on uneven surfaces activity did not occur: Safety/medical concerns         Wheelchair     Assist Is the patient using a wheelchair?: Yes Type of Wheelchair: Manual    Wheelchair assist level: Total Assistance - Patient < 25% Max wheelchair distance: 150 ft    Wheelchair 50 feet with 2 turns activity    Assist        Assist Level: Total Assistance - Patient < 25%   Wheelchair 150 feet activity     Assist      Assist Level: Total Assistance - Patient < 25%   Blood pressure 114/71, pulse 63, temperature 98.9 F (37.2 C), temperature source Oral, resp. rate 17, height 5\' 4"  (1.626 m), weight 65.2 kg, SpO2 97 %.  Medical Problem List and Plan: 1.   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  Impaired mobility: -DVT/anticoagulation:  Mechanical: Sequential compression devices, below knee Bilateral lower extremities             -antiplatelet therapy: Continue aspirin 325 mg daily and Plavix 75 mg daily 3. Pain: Decrease Oxycodone to 5mg  q8H as needed 4. Depression: Continue Cymbalta 30 mg daily             -antipsychotic agents: N/A 5. Neuropsych: This patient is capable of making decisions on her own behalf. 6. Skin/Wound Care: Routine skin checks 7. Fluids/Electrolytes/Nutrition: encourage PO  Potassium 3.9 on 9/2 Supplement with kdur daily 8.  Hyperlipidemia.  Lipitor 9.  CKD stage III.Creatinine baseline 1.10-1.22  Creatinine 1.20 on 9/2 10.  Incidental 2 cm left thyroid nodule.  Follow-up outpatient 11. Hypertension.Norvasc 5mg  daily and Coreg 6.25.mg daily PTA and resume as needed.-  Norvasc restarted, increased to 7.5 on 8/31 Coreg restarted Hydralazine 25 mg q8 hours prn for SBP >180 and DBP >100.  Controlled on 9/3 12.  Acute lower UTI-  UA positive- start empiric Keflex.  Urine culture showing greater than 100,000 E. coli, sensitivities pending 13. Constipation  Improving 14. ABLA: hgb trending down post-op  -iron supplement  -surgical site appears improved  Hemoglobin 9.7 on 8/31, improving  Continue to monitor   LOS: 7 days A FACE TO FACE EVALUATION WAS PERFORMED  9/31 P Hurbert Duran 06/17/2021, 4:40 PM

## 2021-06-17 NOTE — Progress Notes (Signed)
Occupational Therapy Session Note  Patient Details  Name: Denise Leon MRN: [id number] Date of Birth: 07-30-1946  Today's Date: 06/17/2021 OT Missed Time: 75 Minutes Missed Time Reason: Patient fatigue   Short Term Goals: Week 1:  OT Short Term Goal 1 (Week 1): Pt will complete LB dressing with Mod A OT Short Term Goal 2 (Week 1): Pt will complete ambulatory toilet transfer using RW with no more than CGA OT Short Term Goal 3 (Week 1): Pt will complete a shower transfer with CGA using LRAD  Skilled Therapeutic Interventions/Progress Updates:    Attempted to see pt for scheduled OT session, pt received in bed, passed off from SLP. Adamantly declined OOB activity at this time, offered ADL/therex at bed level. Pt cont to decline, stating " I am tired, I want to rest." Pt missed 75 min of scheduled OT, will reattempt as able.   Claudie Revering MS, OTR/L  06/17/2021, 6:45 AM

## 2021-06-17 NOTE — Progress Notes (Signed)
Speech Language Pathology Weekly Progress and Session Note  Patient Details  Name: Denise Leon MRN: [id number] Date of Birth: Mar 08, 1946  Beginning of progress report period: June 11, 2021 End of progress report period: June 17, 2021  Today's Date: 06/17/2021 SLP Individual Time: 0900-1000 SLP Individual Time Calculation (min): 60 min  Short Term Goals: Week 1: SLP Short Term Goal 1 (Week 1): Patient will demonstrate orientation (to place) given min verbal and visual cues. SLP Short Term Goal 1 - Progress (Week 1): Met SLP Short Term Goal 2 (Week 1): Patient will complete mildly complex problem solving given modA verbal cues. SLP Short Term Goal 2 - Progress (Week 1): Met SLP Short Term Goal 3 (Week 1): Patient will demonstrate recall of novel information with mod verbal cues with use of compensatory memory strategies SLP Short Term Goal 3 - Progress (Week 1): Met  New Short Term Goals: Week 2: SLP Short Term Goal 1 (Week 2): STG=LTG due to ELOS  Weekly Progress Updates: Patient has met 3 of 3 short term goals this reporting period. Patient has made slow yet functional progress with ST over the past week. Per phone conversation with daughter, pt exhibits baseline deficits from prior CVA in February 2021, however it is unclear of the severity of baseline deficits as compared to current presentation. Patient is currently completing basic cognitive-linguistic tasks mod A verbal and visual cues for problem solving and short-term recall of novel information. She requires sup A verbal cues to orient (mainly to day of week and place) and is successful with using orientation visuals placed in room. Patient/family education is ongoing. Recommend continued skilled ST intervention to maximize cognitive function and functional independence prior to discharge. Continue progression toward established LTGs set at min assist level overall.    Intensity: Minumum of 1-2 x/day, 30 to 90  minutes Frequency: 3 to 5 out of 7 days Duration/Length of Stay: 9/10 Treatment/Interventions: Cognitive remediation/compensation;Environmental controls;Internal/external aids;Multimodal communication approach;Therapeutic Exercise;Cueing hierarchy;Functional tasks;Patient/family education;Therapeutic Activities  Daily Session Skilled Therapeutic Interventions: Patient agreeable to skilled ST intervention with focus on cognitive goals. Upon arrival patient expressed she was hungry and stated she did not eat breakfast this morning. Nurse was present and reported patient did in fact consume breakfast and patient was unable to recall this. Provided patient with additional oatmeal per request. Patient requested to use bathroom in which she required CGA for ambulation and min A for toileting tasks. Took patient to therapy gym in [facility] where SLP facilitated sustained attention and short-term recall using BITS. Patient was presented with 2-3 written words and asked to identify corresponding pictures in the sequential they were presented. Patient initially completed task with 2 word recall with max A verbal/visual cues and eventually progressed to min A verbal cues and 87% accuracy. She completed 3 word recall with mod A and overall 57% accuracy. Increased errors attributed to decreased sustained attention were noted after approximately 10 minute duration. Patient transferred to bed and was left with alarm activated and immediate needs within reach at end of session. Continue per current plan of care.      General    Pain Pain Assessment Pain Scale: 0-10 Faces Pain Scale: No hurt  Therapy/Group: Individual Therapy  Weber Monnier T Demetrias Goodbar 06/17/2021, 12:30 PM

## 2021-06-17 NOTE — Plan of Care (Signed)
  Problem: RH KNOWLEDGE DEFICIT Goal: RH STG INCREASE KNOWLEDGE OF DIABETES Description: Patient will be able to manage DM with medications and dietary modifications using handouts and educational tools with cues Outcome: Not Progressing; forgetful   Problem: RH KNOWLEDGE DEFICIT Goal: RH STG INCREASE KNOWLEDGE OF HYPERTENSION Description: Patient will be able to manage HTN with medications and dietary modifications using handouts and educational tools with cues Outcome: Not Progressing; forgetful

## 2021-06-18 DIAGNOSIS — I63232 Cerebral infarction due to unspecified occlusion or stenosis of left carotid arteries: Secondary | ICD-10-CM | POA: Diagnosis not present

## 2021-06-18 MED ORDER — OXYCODONE HCL 5 MG PO TABS: 5.0000 mg | ORAL_TABLET | Freq: Two times a day (BID) | ORAL | Status: DC | PRN

## 2021-06-18 NOTE — Progress Notes (Signed)
Physical Therapy Session Note  Patient Details  Name: Denise Leon MRN: [id number] Date of Birth: Dec 29, 1945  Today's Date: 06/18/2021 PT Individual Time: 1300-1357 PT Individual Time Calculation (min): 57 min   Short Term Goals: Week 1:  PT Short Term Goal 1 (Week 1): Patient will perform bed mobility with supervision. PT Short Term Goal 2 (Week 1): Patient will perform basic transfers with supervision using LRAD. PT Short Term Goal 3 (Week 1): Patient will ambulate >100 feet with CGA using LRAD.  Skilled Therapeutic Interventions/Progress Updates:     Pt received seated at EOB with RN present. Session focused on increased ambulation endurance and independence, endurance, and dynamic balance activities. Pt transferred to [facility] at start of session with CGA + RW, and was transferred to therapy gym for time conservation. In gym, pt completed 4 rounds of 7ft amb with CGA + RW. Pt noted to have short step length and slight shuffle. Pt then practiced single step up/down with B hand rail support and MinA for 3 rounds with seated rest breaks. Pt then completed standing balance practice by arranging cards on wall in standing with CGA + RW. Pt was then transferred to second gym to completed seated resisted marching for 4 rounds of 1 min with 45 sec breaks. Pt then requested to use the restroom, and was transferred to room for toileting. Pt transferred to toilet and then recliner with CGA + RW. At end of session, pt was left seated in recliner with alarm engaged, call bell, and all needs in reach.  Therapy Documentation Precautions:  Precautions Precautions: Fall Precaution Comments: R hemi with c/o n/t; HOH with R better than L, speaks some English Restrictions Weight Bearing Restrictions: No    Therapy/Group: Individual Therapy  Perrin Maltese, PT, DPT 06/18/2021, 4:29 PM

## 2021-06-18 NOTE — Progress Notes (Signed)
PROGRESS NOTE   Subjective/Complaints: Patient's chart reviewed- No issues reported overnight Systolic blood pressure elevated, other vitals stable  ROS: Limited due to language  Objective:   No results found. No results for input(s): WBC, HGB, HCT, PLT in the last 72 hours. Recent Labs    06/16/21 0452  NA 137  K 3.9  CL 101  CO2 28  GLUCOSE 112*  BUN 20  CREATININE 1.20*  CALCIUM 9.0    Intake/Output Summary (Last 24 hours) at 06/18/2021 0847 Last data filed at 06/18/2021 0351 Gross per 24 hour  Intake 120 ml  Output 400 ml  Net -280 ml        Physical Exam: Vital Signs Blood pressure (!) 148/68, pulse 72, temperature 98.6 F (37 C), temperature source Oral, resp. rate 16, height 5\' 4"  (1.626 m), weight 65.2 kg, SpO2 98 %. Constitutional: No distress . Vital signs reviewed. HENT: Normocephalic.  Left neck incision with dried blood. Eyes: EOMI. No discharge. Cardiovascular: No JVD.  RRR. Respiratory: Normal effort.  No stridor.  Bilateral clear to auscultation. GI: Non-distended.  BS +. Skin: Warm and dry.  Intact.  Left neck incision healing Psych: Normal mood.  Normal behavior. Musc: No edema in extremities.  No tenderness in extremities. Neuro: Alert, impaired recall Some dysarthria, stable RUE 3-4/5 prox to 3 to 3+ distally. RLE 4-/5 prox to 4/5 distally Functional mobility: CG ambulation  Assessment/Plan: 1. Functional deficits which require 3+ hours per day of interdisciplinary therapy in a comprehensive inpatient rehab setting. Physiatrist is providing close team supervision and 24 hour management of active medical problems listed below. Physiatrist and rehab team continue to assess barriers to discharge/monitor patient progress toward functional and medical goals  Care Tool:  Bathing    Body parts bathed by patient: Chest, Abdomen, Front perineal area, Right upper leg, Left upper leg, Face    Body parts bathed by helper: Right arm, Left arm, Buttocks, Right lower leg, Left lower leg     Bathing assist Assist Level: Moderate Assistance - Patient 50 - 74%     Upper Body Dressing/Undressing Upper body dressing   What is the patient wearing?: Pull over shirt    Upper body assist Assist Level: Minimal Assistance - Patient > 75%    Lower Body Dressing/Undressing Lower body dressing      What is the patient wearing?: Pants     Lower body assist Assist for lower body dressing: Moderate Assistance - Patient 50 - 74%     Toileting Toileting    Toileting assist Assist for toileting: Minimal Assistance - Patient > 75%     Transfers Chair/bed transfer  Transfers assist     Chair/bed transfer assist level: Minimal Assistance - Patient > 75%     Locomotion Ambulation   Ambulation assist      Assist level: Minimal Assistance - Patient > 75% Assistive device: Walker-rolling Max distance: 65   Walk 10 feet activity   Assist     Assist level: Minimal Assistance - Patient > 75% Assistive device: Walker-rolling   Walk 50 feet activity   Assist    Assist level: Minimal Assistance - Patient > 75% Assistive device:  Walker-rolling    Walk 150 feet activity   Assist Walk 150 feet activity did not occur: Safety/medical concerns         Walk 10 feet on uneven surface  activity   Assist Walk 10 feet on uneven surfaces activity did not occur: Safety/medical concerns         Wheelchair     Assist Is the patient using a wheelchair?: Yes Type of Wheelchair: Manual    Wheelchair assist level: Total Assistance - Patient < 25% Max wheelchair distance: 150 ft    Wheelchair 50 feet with 2 turns activity    Assist        Assist Level: Total Assistance - Patient < 25%   Wheelchair 150 feet activity     Assist      Assist Level: Total Assistance - Patient < 25%   Blood pressure (!) 148/68, pulse 72, temperature 98.6 F (37  C), temperature source Oral, resp. rate 16, height 5\' 4"  (1.626 m), weight 65.2 kg, SpO2 98 %.  Medical Problem List and Plan: 1.  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  Impaired mobility: -DVT/anticoagulation:  Mechanical: Sequential compression devices, below knee Bilateral lower extremities             -antiplatelet therapy: Continue aspirin 325 mg daily and Plavix 75 mg daily 3. Pain: Decrease Oxycodone to 5mg  q12H as needed 4. Depression: Continue Cymbalta 30 mg daily             -antipsychotic agents: N/A 5. Neuropsych: This patient is capable of making decisions on her own behalf. 6. Skin/Wound Care: Routine skin checks 7. Fluids/Electrolytes/Nutrition: encourage PO  Potassium 3.9 on 9/2 Supplement with kdur daily 8.  Hyperlipidemia.  Continue Lipitor 9.  CKD stage III.Creatinine baseline 1.10-1.22  Creatinine 1.20 on 9/2 10.  Incidental 2 cm left thyroid nodule.  Follow-up outpatient 11. Hypertension. Continue Norvasc 5mg  daily and Coreg 6.25.mg daily PTA and resume as needed. Norvasc restarted, increased to 7.5 on 8/31 Coreg restarted Hydralazine 25 mg q8 hours prn for SBP >180 and DBP >100.  SBP elevated 9/4- Provided list of foods to help with blood pressure.  12.  Acute lower UTI-  UA positive- start empiric Keflex.  Urine culture showing greater than 100,000 E. Coli- sensitive to Keflex.  13. Constipation  Improving 14. ABLA: hgb trending down post-op  -iron supplement  -surgical site appears improved  Hemoglobin 9.7 on 8/31, improving  Continue to monitor  15. Urinary frequency and urgency: likely secondary to UTI. Also on Ditropan 5mg  TID. Bladder scan with 400 9/4 AM  LOS: 8 days A FACE TO FACE EVALUATION WAS PERFORMED  11/4 Josie Mesa 06/18/2021, 8:47  AM

## 2021-06-19 DIAGNOSIS — I6300 Cerebral infarction due to thrombosis of unspecified precerebral artery: Secondary | ICD-10-CM | POA: Diagnosis not present

## 2021-06-19 MED ORDER — OXYBUTYNIN CHLORIDE 5 MG PO TABS
2.5000 mg | ORAL_TABLET | Freq: Three times a day (TID) | ORAL | Status: DC
  Administered 2021-06-19 (×3): 2.5 mg via ORAL
  Filled 2021-06-19 (×2): qty 1

## 2021-06-19 NOTE — Progress Notes (Signed)
PROGRESS NOTE   Subjective/Complaints:  Remains aphasic , has difficulty with Y/N questions  ROS: Limited due to language  Objective:   No results found. No results for input(s): WBC, HGB, HCT, PLT in the last 72 hours. No results for input(s): NA, K, CL, CO2, GLUCOSE, BUN, CREATININE, CALCIUM in the last 72 hours.   Intake/Output Summary (Last 24 hours) at 06/19/2021 0748 Last data filed at 06/19/2021 0700 Gross per 24 hour  Intake 586 ml  Output 900 ml  Net -314 ml         Physical Exam: Vital Signs Blood pressure (!) 143/74, pulse 71, temperature 98.5 F (36.9 C), temperature source Oral, resp. rate 16, height 5\' 4"  (1.626 m), weight 65.2 kg, SpO2 97 %.  General: No acute distress Mood and affect are appropriate Heart: Regular rate and rhythm no rubs murmurs or extra sounds Lungs: Clear to auscultation, breathing unlabored, no rales or wheezes Abdomen: Positive bowel sounds, soft nontender to palpation, nondistended Extremities: No clubbing, cyanosis, or edema Skin: No evidence of breakdown, no evidence of rash  Musculoskeletal: Full range of motion in all 4 extremities. No joint swelling  Neuro: Alert, impaired recall Some dysarthria, stable RUE 3-4/5 prox to 3 to 3+ distally. RLE 4-/5 prox to 4/5 distally Functional mobility: CG ambulation  Assessment/Plan: 1. Functional deficits which require 3+ hours per day of interdisciplinary therapy in a comprehensive inpatient rehab setting. Physiatrist is providing close team supervision and 24 hour management of active medical problems listed below. Physiatrist and rehab team continue to assess barriers to discharge/monitor patient progress toward functional and medical goals  Care Tool:  Bathing    Body parts bathed by patient: Chest, Abdomen, Front perineal area, Right upper leg, Left upper leg, Face   Body parts bathed by helper: Right arm, Left arm,  Buttocks, Right lower leg, Left lower leg     Bathing assist Assist Level: Moderate Assistance - Patient 50 - 74%     Upper Body Dressing/Undressing Upper body dressing   What is the patient wearing?: Pull over shirt    Upper body assist Assist Level: Minimal Assistance - Patient > 75%    Lower Body Dressing/Undressing Lower body dressing      What is the patient wearing?: Pants     Lower body assist Assist for lower body dressing: Moderate Assistance - Patient 50 - 74%     Toileting Toileting    Toileting assist Assist for toileting: Minimal Assistance - Patient > 75%     Transfers Chair/bed transfer  Transfers assist     Chair/bed transfer assist level: Minimal Assistance - Patient > 75%     Locomotion Ambulation   Ambulation assist      Assist level: Minimal Assistance - Patient > 75% Assistive device: Walker-rolling Max distance: 65   Walk 10 feet activity   Assist     Assist level: Minimal Assistance - Patient > 75% Assistive device: Walker-rolling   Walk 50 feet activity   Assist    Assist level: Minimal Assistance - Patient > 75% Assistive device: Walker-rolling    Walk 150 feet activity   Assist Walk 150 feet activity did not occur:  Safety/medical concerns         Walk 10 feet on uneven surface  activity   Assist Walk 10 feet on uneven surfaces activity did not occur: Safety/medical concerns         Wheelchair     Assist Is the patient using a wheelchair?: Yes Type of Wheelchair: Manual    Wheelchair assist level: Total Assistance - Patient < 25% Max wheelchair distance: 150 ft    Wheelchair 50 feet with 2 turns activity    Assist        Assist Level: Total Assistance - Patient < 25%   Wheelchair 150 feet activity     Assist      Assist Level: Total Assistance - Patient < 25%   Blood pressure (!) 143/74, pulse 71, temperature 98.5 F (36.9 C), temperature source Oral, resp. rate 16,  height 5\' 4"  (1.626 m), weight 65.2 kg, SpO2 97 %.  Medical Problem List and Plan: 1.  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  Impaired mobility: -DVT/anticoagulation:  Mechanical: Sequential compression devices, below knee Bilateral lower extremities             -antiplatelet therapy: Continue aspirin 325 mg daily and Plavix 75 mg daily 3. Pain: Decrease Oxycodone to 5mg  q12H as needed 4. Depression: Continue Cymbalta 30 mg daily             -antipsychotic agents: N/A 5. Neuropsych: This patient is capable of making decisions on her own behalf. 6. Skin/Wound Care: Routine skin checks 7. Fluids/Electrolytes/Nutrition: encourage PO  Potassium 3.9 on 9/2 Supplement with kdur daily, recheck BMET in am  8.  Hyperlipidemia.  Continue Lipitor 9.  CKD stage III.Creatinine baseline 1.10-1.22  Creatinine 1.20 on 9/2 recheck in am  10.  Incidental 2 cm left thyroid nodule.  Follow-up outpatient 11. Hypertension. Continue Norvasc 5mg  daily and Coreg 6.25.mg daily PTA and resume as needed. Norvasc restarted, increased to 7.5 on 8/31 Coreg restarted Hydralazine 25 mg q8 hours prn for SBP >180 and DBP >100.  SBP elevated 9/4- Provided list of foods to help with blood pressure.  12.  Acute lower UTI-  UA positive- start empiric Keflex.  Urine culture showing greater than 100,000 E. Coli- sensitive to Keflex.  13. Constipation  Improving 14. ABLA: hgb trending down post-op  -iron supplement  -surgical site appears improved  Hemoglobin 9.7 on 8/31, improving  Continue to monitor  15. Urinary frequency and urgency: likely secondary to UTI. Also on Ditropan 5mg  TID. Bladder scan with 400 9/4 AM, will reduce ditropan to 2.5mg   LOS: 9 days A FACE TO FACE EVALUATION WAS  PERFORMED  11/4 06/19/2021, 7:48 AM

## 2021-06-19 NOTE — Progress Notes (Signed)
Attempted to in and out cath due to pvr of 430, was unsuccessful, will give patient another chance to void then reattempt cath if needed post void

## 2021-06-19 NOTE — Progress Notes (Signed)
Speech Language Pathology Daily Session Note  Patient Details  Name: Denise Leon MRN: [id number] Date of Birth: [birth year]  Today's Date: 06/19/2021 SLP Individual Time: 1400-1500 SLP Individual Time Calculation (min): 60 min  Short Term Goals: Week 2: SLP Short Term Goal 1 (Week 2): STG=LTG due to ELOS  Skilled Therapeutic Interventions: Patient agreeable to skilled ST intervention with focus on cognitive goals. Patient was sleeping in bed on arrival with lunch tray untouched on bedside table. Pt easy to arouse and agreeable to consume her lunch. Required set-up assist to cut meat and no further assistance necessary. Patient requested to use bathroom where she exited bed and ambulated with walker and CGA, and required min A for cues to initiate retrieval for toilet paper and pulling up her pants. Patient agreeable to return to EOB for remainder of session where she participated in spot the difference type activity where she searched for differences among two contrasting photos. Patient completed task with min A verbal/visual cues. She often pointed to the same object she had previously identified and required verbal reinforcement for recall on how to complete the task. Encouraged patient to transfer to [facility] in preparation of her following session, however she preferred to lay down in bed where she was left with alarm activated and immediate needs within reach at end of session. Continue per current plan of care.      Pain Pain Assessment Pain Scale: 0-10 Pain Score: 0-No pain  Therapy/Group: Individual Therapy  Tamala Ser 06/19/2021, 2:52 PM

## 2021-06-19 NOTE — Progress Notes (Signed)
Physical Therapy Weekly Progress Note  Patient Details  Name: Denise Leon MRN: [id number] Date of Birth: November 27, 1945  Beginning of progress report period: June 11, 2021 End of progress report period: June 19, 2021  Today's Date: 06/19/2021 PT Individual Time: 0805-0900 PT Individual Time Calculation (min): 55 min   Patient has met 2 of 3 short term goals.  Pt is making steady progress towards LTG. Continues to be limited by fatigue but has progressed to min assist bed mobility, CGA transfers and gait up to 119f with RW. Family has not been present for education; will attempt to perform prior to d/c.   Patient continues to demonstrate the following deficits muscle weakness, muscle joint tightness, and muscle paralysis, decreased cardiorespiratoy endurance, abnormal tone, unbalanced muscle activation, and decreased coordination, decreased attention to right, and decreased sitting balance, decreased standing balance, decreased postural control, hemiplegia, and decreased balance strategies and therefore will continue to benefit from skilled PT intervention to increase functional independence with mobility.  Patient progressing toward long term goals..  Continue plan of care.  PT Short Term Goals Week 1:  PT Short Term Goal 1 (Week 1): Patient will perform bed mobility with supervision. PT Short Term Goal 1 - Progress (Week 1): Progressing toward goal PT Short Term Goal 2 (Week 1): Patient will perform basic transfers with supervision using LRAD. PT Short Term Goal 2 - Progress (Week 1): Met PT Short Term Goal 3 (Week 1): Patient will ambulate >100 feet with CGA using LRAD. PT Short Term Goal 3 - Progress (Week 1): Met Week 2:  PT Short Term Goal 1 (Week 2): STG=LTG due to ELOS  Skilled Therapeutic Interventions/Progress Updates:   Pt received supine in bed and agreeable to PT. Supine>sit transfer with min assist and min for cues log rolll technique but poor understanding of  instruction.   Supervision assist transfers with RW throughout session with min cues for safety and proper use of AD in turn to sit in [facility] Gait with supervision assist fading to CGA with fatigue x 1073f Instruction for safety to improve step length and height on BLE, RLE>LLE.  Mild trendelenbreg on the R with fatigue  Seated NMR to performed hip abduction x 10 BLE with level 1 tband, x 5 with AROM and x 5 with manual resistance RLE. LAQ with level 1 tband x 10, hip extension from flexed position x 10 with level 1 tband. Ankle PF/DF x 15 AROM. Instruction for full ROM on the RLE for all exercises as well as decreased eccentric speed.   Standing balance while engaged in gross motor task of pipe tree puzzle x 3 with mod assist for proplem solving and cues to prevent use of LUE.   Pt returned to room and performed stand pivot transfer to bed with RW. Sit>supine completed with supervision assist, and left supine in bed with call bell in reach and all needs met.         Therapy Documentation Precautions:  Precautions Precautions: Fall Precaution Comments: R hemi with c/o n/t; HOH with R better than L, speaks some English Restrictions Weight Bearing Restrictions: No   Pain: Pain Assessment Pain Scale: 0-10 Pain Score: 0-No pain    Therapy/Group: Individual Therapy  [name]/02/2021, 9:17 AM

## 2021-06-19 NOTE — Progress Notes (Signed)
Physical Therapy Session Note  Patient Details  Name: Denise Leon MRN: [id number] Date of Birth: 1946/05/31  Today's Date: 06/19/2021 PT Individual Time: 1030-1055 PT Individual Time Calculation (min): 25 min  General: PT Amount of Missed Time (min): 20 Minutes PT Missed Treatment Reason: Patient fatigue  Short Term Goals: Week 1:  PT Short Term Goal 1 (Week 1): Patient will perform bed mobility with supervision. PT Short Term Goal 1 - Progress (Week 1): Progressing toward goal PT Short Term Goal 2 (Week 1): Patient will perform basic transfers with supervision using LRAD. PT Short Term Goal 2 - Progress (Week 1): Met PT Short Term Goal 3 (Week 1): Patient will ambulate >100 feet with CGA using LRAD. PT Short Term Goal 3 - Progress (Week 1): Met Week 2:  PT Short Term Goal 1 (Week 2): STG=LTG due to ELOS  Skilled Therapeutic Interventions/Progress Updates:  Patient supine in bed on entrance to room. Patient initially asleep with covers over head and takes time to fully wake. On waking and orienting pt to therapist, pt states "no" to therapy. When asks why, she shakes her head and pulls covers over head again. Relates she is "tired" and puts he hand over her head.. Pt asked if she needs to visit bathroom and pt states yes.  No complaint of pain throughout session.   Therapeutic Activity: Bed Mobility: Patient performed supine <> sit with CGA and HHA as she reaches out for help. Min cues required for technique. Transfers: Patient performed sit<>stand and stand pivot transfers throughout session with supervision. Toilet transfer performed with supervision. Provided verbal cues for overall positioning. Pt able to doff pants with supervision and requires Min A with donning brief then pants. Pericare requires setup to initiate.   Gait Training:  Patient ambulated in room distances using RW with close supervision/ light CGA. Demonstrated slow pace with decreased step height/ length. Provided  vc/ tc for increasing knee flexion and step height bilaterally.  Requested pt to sit in w/c, however pt returns to bed and supine in bed with supervision. With covers, pt visibly shivering. Warmed blanket provided with pt demonstrating appreciation. Patient supine  in bed at end of session with brakes locked, bed alarm set, and all needs within reach. RN notified as to pt's state.      Therapy Documentation Precautions:  Precautions Precautions: Fall Precaution Comments: R hemi with c/o n/t; HOH with R better than L, speaks some English Restrictions Weight Bearing Restrictions: No  Pain: Pain Assessment Pain Scale: 0-10 Pain Score: 0-No pain  Therapy/Group: Individual Therapy  Alger Simons PT, DPT 06/19/2021, 10:38 AM

## 2021-06-19 NOTE — Progress Notes (Addendum)
Physical Therapy Session Note  Patient Details  Name: Genesia Caslin MRN: [id number] Date of Birth: 05/06/1946  Today's Date: 06/19/2021 PT Missed Time: 30 Minutes Missed Time Reason:   Refused    Pt pleasantly refusing her PT treatment - requesting to rest due to fatigue from busy day of therapies. She missed 30 minutes of skilled therapy.  Therapy Documentation Precautions:  Precautions Precautions: Fall Precaution Comments: R hemi with c/o n/t; HOH with R better than L, speaks some English Restrictions Weight Bearing Restrictions: No General:     Therapy/Group: Individual Therapy  Orrin Brigham 06/19/2021, 7:51 AM

## 2021-06-20 DIAGNOSIS — I6300 Cerebral infarction due to thrombosis of unspecified precerebral artery: Secondary | ICD-10-CM | POA: Diagnosis not present

## 2021-06-20 LAB — BASIC METABOLIC PANEL
Anion gap: 6 (ref 5–15)
BUN: 26 mg/dL — ABNORMAL HIGH (ref 8–23)
CO2: 26 mmol/L (ref 22–32)
Calcium: 8.8 mg/dL — ABNORMAL LOW (ref 8.9–10.3)
Chloride: 106 mmol/L (ref 98–111)
Creatinine, Ser: 1.26 mg/dL — ABNORMAL HIGH (ref 0.44–1.00)
GFR, Estimated: 45 mL/min — ABNORMAL LOW (ref >60)
Glucose, Bld: 120 mg/dL — ABNORMAL HIGH (ref 70–99)
Potassium: 4.3 mmol/L (ref 3.5–5.1)
Sodium: 138 mmol/L (ref 135–145)

## 2021-06-20 LAB — GLUCOSE, CAPILLARY: Glucose-Capillary: 99 mg/dL (ref 70–99)

## 2021-06-20 NOTE — Progress Notes (Signed)
Physical Therapy Session Note  Patient Details  Name: Denise Leon MRN: [id number] Date of Birth: [birth year]  Today's Date: 06/20/2021 PT Individual Time: 1130-1200 and 2330-0762 PT Individual Time Calculation (min): 30 min and 32 min  Session 2: PT Amount of Missed Time (min): 13 Minutes PT Missed Treatment Reason: Other (Comment) (Pt toileting during lunch and requesting to complete lunch at start of session)  Short Term Goals: Week 1:  PT Short Term Goal 1 (Week 1): Patient will perform bed mobility with supervision. PT Short Term Goal 1 - Progress (Week 1): Progressing toward goal PT Short Term Goal 2 (Week 1): Patient will perform basic transfers with supervision using LRAD. PT Short Term Goal 2 - Progress (Week 1): Met PT Short Term Goal 3 (Week 1): Patient will ambulate >100 feet with CGA using LRAD. PT Short Term Goal 3 - Progress (Week 1): Met Week 2:  PT Short Term Goal 1 (Week 2): STG=LTG due to ELOS  Skilled Therapeutic Interventions/Progress Updates:  Session 1: Patient supine in bed on entrance to room. Patient initially asleep and attempting to remain in bed again this session. alert and agreeable to PT session.   Patient with no pain complaint throughout session.  Therapeutic Activity: Bed Mobility: Patient performed supine <> sit with HHA and CGA. VC/ tc required for supine-->sidelying technique. Transfers: Patient performed sit<>stand and stand pivot transfers throughout session to RW with close supervision. Provided vc/tc for proper hand progression. Blocked practice for rise/ descent with no UE assist with pt able to perform with supervision until c/o fatigue.   Gait Training:  Patient ambulated 70 ft with 2 brief standing rest breaks using RW with close supervision. Demonstrated very slow pace with decreased stride length bilaterally and decreased knee flexion during swing through with RLE worse than LLE. Provided vc/ tc for improving noted impairments.  Patient  seated upright in recliner at end of session with brakes locked, seat alarm set, and all needs within reach.    Session 2:   Patient returning from bathroom with NT on entrance to room. Patient alert and requesting to complete lunch prior to PT session.   Patient with no pain complaint throughout session.  Therapeutic Activity: Bed Mobility: Patient performed  sit-->supine with Mod I and use of bedrail. Transfers: Patient performed sit<>stand and stand pivot transfers throughout session with supervision to RW. Provided vc for positioning during pivot stepping.   Gait Training:  Patient ambulated 45 ft using RW with CGA. Requests to sit d/t fatigue. Provided vc/ tc for increased bil step height.  Neuromuscular Re-ed: NMR facilitated during session with focus on static standing balance. Pt guided in forward reach to targets placed outside of BOS requiring pt to challenge ankle strategy. No LOB noted throughout and pt able to complete reaches to ipsi and contralateral positions from overhead to knee height. NMR performed for improvements in motor control and coordination, balance, sequencing, judgement, and self confidence/ efficacy in performing all aspects of mobility at highest level of independence.   Patient supine  in bed at end of session with brakes locked, bed alarm set, and all needs within reach.   Therapy Documentation Precautions:  Precautions Precautions: Fall Precaution Comments: R hemi with c/o n/t; HOH with R better than L, speaks some English Restrictions Weight Bearing Restrictions: No  Vital Signs: Therapy Vitals Temp: 97.8 F (36.6 C) Temp Source: Oral Pulse Rate: 72 Resp: 20 BP: 131/75 Patient Position (if appropriate): Sitting Oxygen Therapy SpO2: 97 % O2 Device:  Room Air Pain:  Patient with no pain complaint throughout session.  Therapy/Group: Individual Therapy  Alger Simons PT, DPT 06/20/2021, 4:58 PM

## 2021-06-20 NOTE — Progress Notes (Signed)
PROGRESS NOTE   Subjective/Complaints:  Remains aphasic , has difficulty with Y/N questions  ROS: Limited due to language  Objective:   No results found. No results for input(s): WBC, HGB, HCT, PLT in the last 72 hours. Recent Labs    06/20/21 0515  NA 138  K 4.3  CL 106  CO2 26  GLUCOSE 120*  BUN 26*  CREATININE 1.26*  CALCIUM 8.8*     Intake/Output Summary (Last 24 hours) at 06/20/2021 0735 Last data filed at 06/20/2021 0630 Gross per 24 hour  Intake 110 ml  Output 1480 ml  Net -1370 ml         Physical Exam: Vital Signs Blood pressure (!) 149/73, pulse 60, temperature 98.4 F (36.9 C), temperature source Oral, resp. rate 17, height 5\' 4"  (1.626 m), weight 65.2 kg, SpO2 98 %.  General: No acute distress Mood and affect are appropriate Heart: Regular rate and rhythm no rubs murmurs or extra sounds Lungs: Clear to auscultation, breathing unlabored, no rales or wheezes Abdomen: Positive bowel sounds, soft nontender to palpation, nondistended Extremities: No clubbing, cyanosis, or edema Skin: No evidence of breakdown, no evidence of rash    Musculoskeletal: Full range of motion in all 4 extremities. No joint swelling  Neuro: Alert, impaired recall Some dysarthria, stable RUE 3-4/5 prox to 3 to 3+ distally. RLE 4-/5 prox to 4/5 distally Functional mobility: CG ambulation  Assessment/Plan: 1. Functional deficits which require 3+ hours per day of interdisciplinary therapy in a comprehensive inpatient rehab setting. Physiatrist is providing close team supervision and 24 hour management of active medical problems listed below. Physiatrist and rehab team continue to assess barriers to discharge/monitor patient progress toward functional and medical goals  Care Tool:  Bathing    Body parts bathed by patient: Chest, Abdomen, Front perineal area, Right upper leg, Left upper leg, Face   Body parts bathed by  helper: Right arm, Left arm, Buttocks, Right lower leg, Left lower leg     Bathing assist Assist Level: Moderate Assistance - Patient 50 - 74%     Upper Body Dressing/Undressing Upper body dressing   What is the patient wearing?: Pull over shirt    Upper body assist Assist Level: Minimal Assistance - Patient > 75%    Lower Body Dressing/Undressing Lower body dressing      What is the patient wearing?: Pants     Lower body assist Assist for lower body dressing: Moderate Assistance - Patient 50 - 74%     Toileting Toileting    Toileting assist Assist for toileting: Minimal Assistance - Patient > 75%     Transfers Chair/bed transfer  Transfers assist     Chair/bed transfer assist level: Minimal Assistance - Patient > 75%     Locomotion Ambulation   Ambulation assist      Assist level: Minimal Assistance - Patient > 75% Assistive device: Walker-rolling Max distance: 65   Walk 10 feet activity   Assist     Assist level: Minimal Assistance - Patient > 75% Assistive device: Walker-rolling   Walk 50 feet activity   Assist    Assist level: Minimal Assistance - Patient > 75% Assistive device:  Walker-rolling    Walk 150 feet activity   Assist Walk 150 feet activity did not occur: Safety/medical concerns         Walk 10 feet on uneven surface  activity   Assist Walk 10 feet on uneven surfaces activity did not occur: Safety/medical concerns         Wheelchair     Assist Is the patient using a wheelchair?: Yes Type of Wheelchair: Manual    Wheelchair assist level: Total Assistance - Patient < 25% Max wheelchair distance: 150 ft    Wheelchair 50 feet with 2 turns activity    Assist        Assist Level: Total Assistance - Patient < 25%   Wheelchair 150 feet activity     Assist      Assist Level: Total Assistance - Patient < 25%   Blood pressure (!) 149/73, pulse 60, temperature 98.4 F (36.9 C), temperature  source Oral, resp. rate 17, height 5\' 4"  (1.626 m), weight 65.2 kg, SpO2 98 %.  Medical Problem List and Plan: 1.  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  Impaired mobility: -DVT/anticoagulation:  Mechanical: Sequential compression devices, below knee Bilateral lower extremities             -antiplatelet therapy: Continue aspirin 325 mg daily and Plavix 75 mg daily 3. Pain: Decrease Oxycodone to 5mg  q12H as needed 4. Depression: Continue Cymbalta 30 mg daily             -antipsychotic agents: N/A 5. Neuropsych: This patient is capable of making decisions on her own behalf. 6. Skin/Wound Care: Routine skin checks 7. Fluids/Electrolytes/Nutrition: encourage PO  Potassium 3.9 on 9/2 Supplement with kdur daily, recheck BMET in am  8.  Hyperlipidemia.  Continue Lipitor 9.  CKD stage III.Creatinine baseline 1.10-1.22  Creatinine 1.20 on 9/2 recheck in am  10.  Incidental 2 cm left thyroid nodule.  Follow-up outpatient 11. Hypertension. Continue Norvasc 5mg  daily and Coreg 6.25.mg daily PTA and resume as needed. Norvasc restarted, increased to 7.5 on 8/31 Coreg restarted Hydralazine 25 mg q8 hours prn for SBP >180 and DBP >100.  SBP elevated 9/4- Provided list of foods to help with blood pressure.  12.  Acute lower UTI-  UA positive- start empiric Keflex.  Urine culture showing greater than 100,000 E. Coli- sensitive to Keflex. Cont for 7d rx  13. Constipation  Improving 14. ABLA: hgb trending down post-op  -iron supplement  -surgical site appears improved  Hemoglobin 9.7 on 8/31, improving  Continue to monitor  15. Urinary retention likely due to oxybutnin Bladder scan with 400 9/4 AM, will d/c ditropan ,   LOS: 10 days A FACE TO FACE EVALUATION WAS  PERFORMED  11/4 06/20/2021, 7:35 AM

## 2021-06-20 NOTE — Progress Notes (Signed)
Educated pt on importance of drinking fluids. Pt in agreement. Mylo Red, LPN

## 2021-06-20 NOTE — Progress Notes (Signed)
Occupational Therapy Session Note  Patient Details  Name: Denise Leon MRN: [id number] Date of Birth: 05/15/1946  Today's Date: 06/20/2021 OT Missed Time: 60 Minutes Missed Time Reason: Patient fatigue   Short Term Goals: Week 1:  OT Short Term Goal 1 (Week 1): Pt will complete LB dressing with Mod A OT Short Term Goal 2 (Week 1): Pt will complete ambulatory toilet transfer using RW with no more than CGA OT Short Term Goal 3 (Week 1): Pt will complete a shower transfer with CGA using LRAD  Skilled Therapeutic Interventions/Progress Updates:    Attempt 1: Pt received semi-reclined in bed. Offered ADL/OOB activity, pt reporting "later, I need to rest." Cont to adamantly decline OOB activity despite encouragement. Pt agreeable to therapist attempting back later. Pt left semi-reclined in bed with bed alarm engaged, call bell in reach, and all immediate needs met.   Attempt 2: Checked back with pt 30 min later, pt asleep in bed. Did not awaken to moderate Vcs. Pt left semi-reclined in bed with bed alarm engaged, call bell in reach.    Therapy Documentation Precautions:  Precautions Precautions: Fall Precaution Comments: R hemi with c/o n/t; HOH with R better than L, speaks some English Restrictions Weight Bearing Restrictions: No  Pain: no c/o   ADL: See Care Tool for more details.   Therapy/Group: Individual Therapy   A  MS, OTR/L  06/20/2021, 6:34 AM 

## 2021-06-20 NOTE — Progress Notes (Signed)
Speech Language Pathology Daily Session Note  Patient Details  Name: Denise Leon MRN: [id number] Date of Birth: 1946-02-01  Today's Date: 06/20/2021 SLP Individual Time: 7517-0017 SLP Individual Time Calculation (min): 35 min and Today's Date: 06/20/2021 SLP Missed Time: 25 Minutes Missed Time Reason: Patient unwilling to participate;Patient fatigue  Short Term Goals: Week 2: SLP Short Term Goal 1 (Week 2): STG=LTG due to ELOS  Skilled Therapeutic Interventions: Patient received sitting EOB taking her medications with nurse at bedside. After she finished taking her medication, Pt requested to use bathroom where she required min A to stand, and CGA for ambulation using walker. Required min A verbal cues to initiate toileting tasks including wiping, flushing toilet, and mod A for pulling up pants. She required additional assist today as compared to previous sessions and appeared slightly SOB with ambulation. Vitals taken: SP02 99, oral temp 97.7, BP 131/71, pulse 71. Patient denied any s/sx of discomfort, stated "I'm tired." Patient preferred to return to bed vs. chair and denied consideration in putting on pants, or completing any ADLs such as brushing teeth or combing hair at sink. When SLP asked verbal confirmation to proceed further with planned therapy session with focus on cognitive goals, she expressed "no, I want to sleep." She continued to deny consideration of further therapy with all further attempts. Pt missed 25 minutes of treatment. Patient was left in bed with alarm activated and immediate needs within reach at end of session. Continue per current plan of care.      Pain Pain Assessment Pain Scale: 0-10 Pain Score: 0-No pain  Therapy/Group: Individual Therapy  Ilee Randleman T Tori Cupps 06/20/2021, 8:07 AM

## 2021-06-21 DIAGNOSIS — I6300 Cerebral infarction due to thrombosis of unspecified precerebral artery: Secondary | ICD-10-CM | POA: Diagnosis not present

## 2021-06-21 MED ORDER — BETHANECHOL CHLORIDE 10 MG PO TABS
10.0000 mg | ORAL_TABLET | Freq: Three times a day (TID) | ORAL | Status: DC
  Administered 2021-06-21 – 2021-06-24 (×11): 10 mg via ORAL
  Filled 2021-06-21 (×11): qty 1

## 2021-06-21 NOTE — Progress Notes (Signed)
Speech Language Pathology Daily Session Note  Patient Details  Name: Denise Leon MRN: [id number] Date of Birth: [birth year]  Today's Date: 06/21/2021 SLP Individual Time: 1130-1200 SLP Individual Time Calculation (min): 30 min  Short Term Goals: Week 2: SLP Short Term Goal 1 (Week 2): STG=LTG due to ELOS  Skilled Therapeutic Interventions: Patient agreeable to skilled ST intervention with focus on cognitive goals. Upon arrival patient verbalized urgent need to get to the bathroom to urinate although no attempt to press call button which was placed within reach next to her in bed. SLP reinforced education on call bell and uses. Patient did not recall how to use call button and required max A verbal instruction to press button with enough force to activate. After 10 minute delay, pt required max A verbal reminders to use call bell when given scenarios in various ways to facilitate comprehension (i.e., what can you do to get help? what do you press to get help? What do you do if you need to pee?). By end of session, patient was eventually able to successfully press call button and verbalized how to use however retention of this may be limited 2/2 memory deficits. SLP facilitated orientation with mod A verbal and visual cues to orient to time and place. SLP referred to calendar in room however patient expressed "I forget how to use it." Prior to addressing, SLP confirmed that patient utilized a calendar at prior level and cab read numbers and words English while acknowledging language differenced. Prompted her to read days of week and numbers aloud for further confirmation. Patient did this with 100% accuracy. SLP facilitated identification of information on calendar with max A verbal cues progressing to mod A verbal/visual cues to identify dates and days of week. (E.g., what day of the week is the 10th?). Patient was left in bed with alarm activated and immediate needs within reach at end of session.  Continue per current plan of care.      Pain Pain Assessment Pain Scale: 0-10 Pain Score: 0-No pain  Therapy/Group: Individual Therapy  Tamala Ser 06/21/2021, 11:37 AM

## 2021-06-21 NOTE — Plan of Care (Signed)
  Problem: RH Ambulation Goal: LTG Patient will ambulate in controlled environment (PT) Description: LTG: Patient will ambulate in a controlled environment, # of feet with assistance (PT). Flowsheets Taken 06/21/2021 0803 by Golden Pop, PT LTG: Ambulation distance in controlled environment: 115 Taken 06/11/2021 1628 by Serina Cowper L, PT LTG: Pt will ambulate in controlled environ  assist needed:: Supervision/Verbal cueing Note: Downgraded due to continued fatigue limiting improvements.  Goal: LTG Patient will ambulate in home environment (PT) Description: LTG: Patient will ambulate in home environment, # of feet with assistance (PT). Flowsheets Taken 06/21/2021 0803 by Golden Pop, PT LTG: Pt will ambulate in home environ  assist needed:: Supervision/Verbal cueing Taken 06/11/2021 1628 by Grunenberg, Cherie L, PT LTG: Ambulation distance in home environment: 50 ft using LRAD

## 2021-06-21 NOTE — Progress Notes (Signed)
Encouraged pt to drink fluids.

## 2021-06-21 NOTE — Progress Notes (Signed)
PROGRESS NOTE   Subjective/Complaints:  Remains aphasic , has difficulty with Y/N questions  ROS: Limited due to language  Objective:   No results found. No results for input(s): WBC, HGB, HCT, PLT in the last 72 hours. Recent Labs    06/20/21 0515  NA 138  K 4.3  CL 106  CO2 26  GLUCOSE 120*  BUN 26*  CREATININE 1.26*  CALCIUM 8.8*     Intake/Output Summary (Last 24 hours) at 06/21/2021 0727 Last data filed at 06/21/2021 0435 Gross per 24 hour  Intake 210 ml  Output 289 ml  Net -79 ml         Physical Exam: Vital Signs Blood pressure (!) 148/81, pulse 70, temperature 97.7 F (36.5 C), resp. rate 18, height 5' 4"  (1.626 m), weight 65.2 kg, SpO2 99 %.    General: No acute distress Mood and affect are appropriate Heart: Regular rate and rhythm no rubs murmurs or extra sounds Lungs: Clear to auscultation, breathing unlabored, no rales or wheezes Abdomen: Positive bowel sounds, soft nontender to palpation, nondistended Extremities: No clubbing, cyanosis, or edema Skin: No evidence of breakdown, no evidence of rash  Musculoskeletal: Full range of motion in all 4 extremities. No joint swelling  Neuro: Alert, impaired recall Some dysarthria, stable RUE 3-4/5 prox to 3 to 3+ distally. RLE 4-/5 prox to 4/5 distally Functional mobility: CG ambulation  Assessment/Plan: 1. Functional deficits which require 3+ hours per day of interdisciplinary therapy in a comprehensive inpatient rehab setting. Physiatrist is providing close team supervision and 24 hour management of active medical problems listed below. Physiatrist and rehab team continue to assess barriers to discharge/monitor patient progress toward functional and medical goals  Care Tool:  Bathing    Body parts bathed by patient: Chest, Abdomen, Front perineal area, Right upper leg, Left upper leg, Face   Body parts bathed by helper: Right arm, Left arm,  Buttocks, Right lower leg, Left lower leg     Bathing assist Assist Level: Moderate Assistance - Patient 50 - 74%     Upper Body Dressing/Undressing Upper body dressing   What is the patient wearing?: Pull over shirt    Upper body assist Assist Level: Minimal Assistance - Patient > 75%    Lower Body Dressing/Undressing Lower body dressing      What is the patient wearing?: Pants     Lower body assist Assist for lower body dressing: Moderate Assistance - Patient 50 - 74%     Toileting Toileting    Toileting assist Assist for toileting: Minimal Assistance - Patient > 75%     Transfers Chair/bed transfer  Transfers assist     Chair/bed transfer assist level: Minimal Assistance - Patient > 75%     Locomotion Ambulation   Ambulation assist      Assist level: Minimal Assistance - Patient > 75% Assistive device: Walker-rolling Max distance: 65   Walk 10 feet activity   Assist     Assist level: Minimal Assistance - Patient > 75% Assistive device: Walker-rolling   Walk 50 feet activity   Assist    Assist level: Minimal Assistance - Patient > 75% Assistive device: Walker-rolling  Walk 150 feet activity   Assist Walk 150 feet activity did not occur: Safety/medical concerns         Walk 10 feet on uneven surface  activity   Assist Walk 10 feet on uneven surfaces activity did not occur: Safety/medical concerns         Wheelchair     Assist Is the patient using a wheelchair?: Yes Type of Wheelchair: Manual    Wheelchair assist level: Total Assistance - Patient < 25% Max wheelchair distance: 150 ft    Wheelchair 50 feet with 2 turns activity    Assist        Assist Level: Total Assistance - Patient < 25%   Wheelchair 150 feet activity     Assist      Assist Level: Total Assistance - Patient < 25%   Blood pressure (!) 148/81, pulse 70, temperature 97.7 F (36.5 C), resp. rate 18, height 5' 4"  (1.626 m),  weight 65.2 kg, SpO2 99 %.  Medical Problem List and Plan: 1.  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 Formulized individual treatment plan based on medical history, underlying problem and comorbidities.  2.  Impaired mobility: -DVT/anticoagulation:  Mechanical: Sequential compression devices, below knee Bilateral lower extremities             -antiplatelet therapy: Continue aspirin 325 mg daily and Plavix 75 mg daily 3. Pain: Decrease Oxycodone to 16m q12H as needed 4. Depression: Continue Cymbalta 30 mg daily             -antipsychotic agents: N/A 5. Neuropsych: This patient is capable of making decisions on her own behalf. 6. Skin/Wound Care: Routine skin checks 7. Fluids/Electrolytes/Nutrition: encourage PO  Potassium 3.9 on 9/2, 4.3 on 9/6 nl Supplement with kdur 267m daily, cont current dose  8.  Hyperlipidemia.  Continue Lipitor 9.  CKD stage III.Creatinine baseline 1.10-1.22  Creatinine 1.20 on 9/2 , stable at 1.26 on 9/6 10.  Incidental 2 cm left thyroid nodule.  Follow-up outpatient 11. Hypertension. Continue Norvasc 72m81maily and Coreg 6.25.mg daily PTA and resume as needed. Norvasc restarted, increased to 7.5 on 8/31 Coreg restarted Hydralazine 25 mg q8 hours prn for SBP >180 and DBP >100.  Vitals:   06/20/21 1955 06/21/21 0416  BP: 125/77 (!) 148/81  Pulse: 70 70  Resp: 16 18  Temp: 99.1 F (37.3 C) 97.7 F (36.5 C)  SpO2: 96% 99%   Fair control 9/7 cont current meds  12.  Acute lower UTI-  UA positive- start empiric Keflex.  Urine culture showing greater than 100,000 E. Coli- sensitive to Keflex. Cont for 7d rx   13. Constipation  Improving 14. ABLA: hgb trending down post-op  -iron supplement  -surgical site appears improved  Hemoglobin 9.7 on 8/31, improving  Continue to monitor  15. Urinary retention likely due to oxybutnin Bladder scan with 400 9/4 AM, will d/c ditropan ,   LOS: 11 days A FACE TO FACBoazKirsteins 06/21/2021, 7:27 AM

## 2021-06-21 NOTE — Patient Care Conference (Signed)
Inpatient RehabilitationTeam Conference and Plan of Care Update Date: 06/21/2021   Time: 10:15 AM    Patient Name: Denise Leon      Medical Record Number: [id number]  Date of Birth: 02-15-1946 Sex: Female         Room/Bed: 4M04C/4M04C-01 Payor Info: Payor: MEDICARE / Plan: MEDICARE PART A AND B / Product Type: *No Product type* /    Admit Date/Time:  06/10/2021  1:55 PM  Primary Diagnosis:  CVA (cerebral vascular accident) [facility])  Hospital Problems: Principal Problem:   CVA (cerebral vascular accident) (HCC) Active Problems:   Acute ischemic stroke (HCC) patchy L frontal infarcts s/p tPA   CKD (chronic kidney disease), stage IIIa   Occlusion of carotid stent, subsequent encounter   Acute blood loss anemia   Hypokalemia   Acute lower UTI    Expected Discharge Date: Expected Discharge Date: 06/24/21  Team Members Present: Physician leading conference: Dr. Claudette Laws Social Worker Present: Lavera Guise, BSW Nurse Present: Chana Bode, RN PT Present: Grier Rocher, PT OT Present: Annye English, OT SLP Present: Eilene Ghazi, SLP PPS Coordinator present : Fae Pippin, SLP     Current Status/Progress Goal Weekly Team Focus  Bowel/Bladder   Patient is continent of bowel and bladder. PVR bladder scans continue q6h with in and out cath for > 300. LBM 06/20/21.  Patient will fully empty bladder when voiding, No in and out cath needed.  Offer toileting q2h and prn. Continue bladder scans per order.   Swallow/Nutrition/ Hydration   SLP not addressing. WFL         ADL's   CGA to close S ambulatory toilet transfers, min A for toileting tasks, UBD, mod A LBD, Brunstrum RUE II to III, hand IV; declining OT frequently 2/2 fatigue/RUE pain, frequently declines ADL practice  S for ADL and transfers  RUE NMR, self-care/transfer/balance retraining, pt/family/AE/DME education, activity tolerance   Mobility   Bed mobility = supervision/ Mod I, Transfers = CGA/ supervision,  gait = ~50 feet with RW and CGA  Mod I for bed mobility and transfers; supervision for gait; CGA for stairs  Continued focus on activity tolerance, NMR for overall balance, transfers, gait, stairs, family education   Communication   SLP not addressing         Safety/Cognition/ Behavioral Observations  min-to-mod A, mod-to-max for memory (likely baseline per daughter). Patient appears less interested/motivated for therapy  sup-to-min A  orientation using external aids, basic problem solving   Pain   Patient denies pain at this time.  Pain level < 2  Assess patient's pain level q shift and prn. Administer prn medication for pain as ordered.   Skin   Closed incision to left neck and closed incision to right groin.  Patient will remain free from s/s of infection to incision sites.  Assess incision sites for s/s of infection. Assess skin q shift and prn for any open areas/redness.     Discharge Planning:  Patient lives with daughter and son in law. 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 UTI treated with Keflex through 06/22/21. Hgb stable but may be a factor in fatigue level noted. Patient on target to meet rehab goals: Currently needs min assist for toileting tasks. Little progressing due to fatigue and right UE pain. Needs min assist for upper body bathing and dressing and mod assist for lower body care. CGA for bed mobility and supervision for  transfers. Able to ambulate up to 100' max with rest break afterwards. Needs min - mod assist for basic cognitive tasks. Need significant assist for memory at baseline per daughter. Struggles with poor retention and recall after 30 seconds. Focus on orientation using external aides. Goals for discharge set at supervision overall  *See Care Plan and progress notes for long and short-term goals.   Revisions to Treatment Plan:  Downgraded goals to min assist and distance for PT   Teaching Needs: Safety, secondary risk management, medications, toileting, etc  Current Barriers to Discharge: Decreased caregiver support and Home enviroment access/layout  Possible Resolutions to Barriers: Family education with daughter and son in-law     Medical Summary Current Status: non fluent aphasia , int cath  Barriers to Discharge: Medical stability   Possible Resolutions to [employer] Focus: work on toileting and bladder emptying   Continued Need for Acute Rehabilitation Level of Care: The patient requires daily medical management by a physician with specialized training in physical medicine and rehabilitation for the following reasons: Direction of a multidisciplinary physical rehabilitation program to maximize functional independence : Yes Medical management of patient stability for increased activity during participation in an intensive rehabilitation regime.: Yes Analysis of laboratory values and/or radiology reports with any subsequent need for medication adjustment and/or medical intervention. : Yes   I attest that I was present, lead the team conference, and concur with the assessment and plan of the team.   Chana Bode B 06/21/2021, 12:02 PM

## 2021-06-21 NOTE — Progress Notes (Signed)
Physical Therapy Session Note  Patient Details  Name: Denise Leon MRN: [id number] Date of Birth: 1946-08-30  Today's Date: 06/21/2021 PT Individual Time: 6924-9324 [phone] PT Individual Time Calculation (min): 53 min and 10 min   Short Term Goals:  Week 2:  PT Short Term Goal 1 (Week 2): STG=LTG due to ELOS   Skilled Therapeutic Interventions/Progress Updates:  Session 1  Pt received sitting in recliner and agreeable to PT. Denies any pain. Stand pivot and sit<>stand transfers with supervision assist and RW for safety. Mild shuffle but improved with instruction. Pt transported to entrance of [place]. Gait training with RW on cement sidewalk with supervision assist x 38f +447fwith min cues for improved Posture, use for RUE on RW, and increased step length on the RLE.   Seated NMR to performed in WC. Hip flexion x 10 BLE, hip abbuction x 10BLE, and LAQ x 10 BLE cues for attention to the RLE and improved ROM on the R with abduction.   Nustep reciprocal movement training with min assist to stabilize the RUE. Performed x 4 min on level 3 with and BLE only x 2 min on level 1. Cues for full ROM on the RLE with LE only.   Patient returned to room and performed stand pivot to recliner with RW and supervision assist. Pt left sitting in recliner with call bell in reach and all needs met.    Session 2.  Pt received sitting in recliner and reports fatigue and wanting to get into bed, but denies pain. [facility] and supervision assist. PT assisted pt to remove shoes with max assist; supervision assist for balance sitting EOB. Supervision assist for sit>supine with cues for RUE placement. Pt left supine in bed with call bell in reach and all needs met.       Therapy Documentation Precautions:  Precautions Precautions: Fall Precaution Comments: R hemi with c/o n/t; HOH with R better than L, speaks some English Restrictions Weight Bearing Restrictions: No General: PT Amount  of Missed Time (min): 35 Minutes PT Missed Treatment Reason: Patient fatigue Vital Signs: Therapy Vitals Temp: 98.3 F (36.8 C) Pulse Rate: 70 Resp: 20 BP: 119/70 Patient Position (if appropriate): Sitting Oxygen Therapy SpO2: 98 % O2 Device: Room Air    Therapy/Group: Individual Therapy  [name]/04/2021, 2:05 PM

## 2021-06-21 NOTE — Plan of Care (Signed)
  Problem: RH Bathing Goal: LTG Patient will bathe all body parts with assist levels (OT) Description: LTG: Patient will bathe all body parts with assist levels (OT) Flowsheets (Taken 06/21/2021 1455) LTG: Pt will perform bathing with assistance level/cueing: (Goal downgraded 2/2 slower than anticipated progress.) Minimal Assistance - Patient > 75% Note: Goal downgraded 2/2 slower than anticipated progress.   Problem: RH Dressing Goal: LTG Patient will perform lower body dressing w/assist (OT) Description: LTG: Patient will perform lower body dressing with assist, with/without cues in positioning using equipment (OT) Flowsheets (Taken 06/21/2021 1455) LTG: Pt will perform lower body dressing with assistance level of: (Goal downgraded 2/2 slower than anticipated progress.) Minimal Assistance - Patient > 75% Note: Goal downgraded 2/2 slower than anticipated progress.   Problem: RH Toileting Goal: LTG Patient will perform toileting task (3/3 steps) with assistance level (OT) Description: LTG: Patient will perform toileting task (3/3 steps) with assistance level (OT)  Flowsheets (Taken 06/21/2021 1455) LTG: Pt will perform toileting task (3/3 steps) with assistance level: (Goal downgraded 2/2 slower than anticipated progress.) Minimal Assistance - Patient > 75%   Problem: RH Tub/Shower Transfers Goal: LTG Patient will perform tub/shower transfers w/assist (OT) Description: LTG: Patient will perform tub/shower transfers with assist, with/without cues using equipment (OT) Flowsheets (Taken 06/21/2021 1455) LTG: Pt will perform tub/shower stall transfers with assistance level of: (Goal downgraded 2/2 slower than anticipated progress.) Contact Guard/Touching assist Note: Goal downgraded 2/2 slower than anticipated progress.

## 2021-06-21 NOTE — Progress Notes (Signed)
Occupational Therapy Weekly Progress Note  Patient Details  Name: Denise Leon MRN: [id number] Date of Birth: June 15, 1946  Beginning of progress report period: June 12, 2021 End of progress report period: June 21, 2021  Today's Date: 06/21/2021 OT Individual Time: 2952-8413 OT Individual Time Calculation (min): 53 min    Patient has met 3 of 4 short term goals.  Pt has made moderate progress this week in OT. Pt has demonstrated improved functional use of RUE/ability to compensate for deficits, dynamic standing balance, activity tolerance to presently complete UBD at min A level, grooming at S level, LBD, and toileting at min A level. Pt cont to be primarily limited by R hemiplegia, RUE pain, decreased activity tolerance. Pt frequently declining therapy throughout week 2/2 fatigue and ongoing RUE pain. RUE and hand currently assessed at Brummstrom level III and IV respectively. Anticipate intermittent S and CGA physical assist required upon DC.   Patient continues to demonstrate the following deficits: muscle weakness, decreased cardiorespiratoy endurance, impaired timing and sequencing, abnormal tone, unbalanced muscle activation, decreased coordination, and decreased motor planning, decreased initiation, decreased awareness, and decreased safety awareness, and decreased standing balance, decreased postural control, hemiplegia, and decreased balance strategies and therefore will continue to benefit from skilled OT intervention to enhance overall performance with BADL and Reduce care partner burden.  Patient not progressing toward long term goals.  See goal revision..  Plan of care revisions: LBD and toileting downgraded to min A based on slower than anticipated progress.  OT Short Term Goals Week 2:  OT Short Term Goal 1 (Week 2): STG = LTG 2/2 ELOS  Skilled Therapeutic Interventions/Progress Updates:    Pt received semi-reclined in bed finishing breakfast, denies pain but c/o ongoing RUE  numbness, agreeable to therapy. Session focus on self-care retraining, activity tolerance, LUE NMR, func transfers in prep for improved ADL/IADL/func mobility performance + decreased caregiver burden. Came to sitting EOB with min A to progress BLE off bed. Declines shower. Doffed shirt with min A to pull over head. Bathed UB with washcloth with S, bathed upper LE with S. Would req assistance to reach feet. Donned shirt with min A to thread RUE/pull over R shoulder. Donned brief x2 with mod A to thread BLE/adjust over hips 2/2 being too large. Donned pants with light min A to pull up in the back. Donned B shoes with max A, able to doff with S. STS and amb to sink  > bathroom > w/c with CGA + RW + min Vcs for RUE placement on RW. Poor R foot clearance noted. Able to complete oral care with S in standing and assist to apply toothpaste 2/2 R hand weakness. Completed toileting tasks with min A to pull up pants in back + cues to retrieve toilet paper. Noted to be incontinent of bowel and req max A to change brief.    Seated in w/c, completed 2x15 of the following with 1 lb barbell: forward towel slides, biceps curls, scapular elevation, forearm pronation/supination.    Amb transfer back to bed with CGA + RW, min A to progress LLE back onto bed.   Pt left semi-reclined in bed with bed alarm engaged, call bell in reach, and all immediate needs met.    Therapy Documentation Precautions:  Precautions Precautions: Fall Precaution Comments: R hemi with c/o n/t; HOH with R better than L, speaks some English Restrictions Weight Bearing Restrictions: No  Pain:  See session note ADL: See Care Tool for more details.   Therapy/Group:  Individual Therapy  Volanda Napoleon MS, OTR/L  06/21/2021, 6:39 AM

## 2021-06-21 NOTE — Progress Notes (Signed)
Patient ID: Denise Leon, female   DOB: 01-13-1946, 75 y.o.   MRN: [id number] Team Conference Report to Patient/Family  Team Conference discussion was reviewed with the patient and caregiver, including goals, any changes in plan of care and target discharge date.  Patient and caregiver express understanding and are in agreement.  The patient has a target discharge date of 06/24/21.  SW called pt daughter to provide conference updates. Daughter wanting more updates about pt wound.Sw ill have RN follow up. Family education scheduled on Friday 9/9 8-12. No additional questions or concerns, sw will continue to follow up.  [name] 06/21/2021, 11:31 AM

## 2021-06-21 NOTE — Progress Notes (Signed)
Occupational Therapy Session Note  Patient Details  Name: Denise Leon MRN: [id number] Date of Birth: [birth year]  Today's Date: 06/21/2021 OT Individual Time: 3382-5053 OT Individual Time Calculation (min): 40 min    Short Term Goals: Week 1:  OT Short Term Goal 1 (Week 1): Pt will complete LB dressing with Mod A OT Short Term Goal 2 (Week 1): Pt will complete ambulatory toilet transfer using RW with no more than CGA OT Short Term Goal 3 (Week 1): Pt will complete a shower transfer with CGA using LRAD   Skilled Therapeutic Interventions/Progress Updates:    Pt greeted at time of session sitting up in recliner agreeable to OT session with encouragement, pt communicating she is tired. Agreeable to attempt toileting, ambulated chair <> bathroom CGA/close supervision with RW and short shuffling gait noted, clothing management Min A when pulling brief back over hips needed help to fully pull up in the back and R side. Back in recliner, pt agreeable to in room activity and declined leaving room. BUE ROM/strengthening with 2# dowel and therapist facilitation at elbow as needed for the following 2x10: bicep curl, chest press, FWD circles and backward circles with cues for symmetry. 2x15 foam squeezes per hand as well with encouragement for translation but pt fatigued and needed therapist to switch hands. Pt needing frequent rest breaks and communicated with short phrases and gestures throughout session. Up in recliner alarm on call bell in reach.   Therapy Documentation Precautions:  Precautions Precautions: Fall Precaution Comments: R hemi with c/o n/t; HOH with R better than L, speaks some English Restrictions Weight Bearing Restrictions: No    Therapy/Group: Individual Therapy  Erasmo Score 06/21/2021, 7:25 AM

## 2021-06-22 DIAGNOSIS — N1831 Chronic kidney disease, stage 3a: Secondary | ICD-10-CM | POA: Diagnosis not present

## 2021-06-22 DIAGNOSIS — E876 Hypokalemia: Secondary | ICD-10-CM | POA: Diagnosis not present

## 2021-06-22 DIAGNOSIS — I6300 Cerebral infarction due to thrombosis of unspecified precerebral artery: Secondary | ICD-10-CM | POA: Diagnosis not present

## 2021-06-22 NOTE — Progress Notes (Signed)
Speech Language Pathology Daily Session Note  Patient Details  Name: Denise Leon MRN: [id number] Date of Birth: Jan 03, 1946  Today's Date: 06/22/2021 SLP Individual Time: 1275-1700 SLP Individual Time Calculation (min): 35 min and Today's Date: 06/22/2021 SLP Missed Time: 10 Minutes Missed Time Reason: Patient unwilling to participate  Short Term Goals: Week 2: SLP Short Term Goal 1 (Week 2): STG=LTG due to ELOS  Skilled Therapeutic Interventions: Patient was sleeping on arrival and easy to arouse with verbal stimuli. Breakfast tray was untouched on bedside table. Pt was not initially agreeable to skilled ST intervention and requested for ST to come back later. ST attempted again 10 minutes later and patient was agreeable at this time where she was repositioned, HOB elevated, and set-up to consume breakfast. Nurse arrived during session to administer meds. Pt required applesauce to take a larger pill in which she reported pharyngeal retention. She eventually denied symptoms with additional applesauce and liquid rinse. Exhibited on overt s/sx of aspiration throughout meal. SLP facilitated orientation with min A verbal cues for orientation to place and time (month, yr, day of week). Patient required field of 3 choices and cues to refer to external aids. SLP facilitated comprehension of monthly calendar with sup A verbal cues to identify month at the top, and mod A verbal cues to identify day of week when given a specific date, similar to task completed during previous session. Patient exhibited minimal carryover/recall with calendar from yesterday's session, however successfully explained and demonstrated use of call bell with sup A verbal cues which was also discussed yesterday in which pt required max A. Patient was left in bed with alarm activated and immediate needs within reach at end of session. Continue per current plan of care.      Pain Pain Assessment Pain Scale: 0-10 Pain Score: 0-No  pain  Therapy/Group: Individual Therapy  Mischell Branford T Adryan Druckenmiller 06/22/2021, 9:10 AM

## 2021-06-22 NOTE — Discharge Summary (Signed)
Physician Discharge Summary  Patient ID: Denise Leon MRN: [id number] DOB/AGE: August 09, 1946 75 y.o.  Admit date: 06/10/2021 Discharge date: 06/24/2021  Discharge Diagnoses:  Principal Problem:   CVA (cerebral vascular accident) [facility]) Active Problems:   Acute ischemic stroke (HCC) patchy L frontal infarcts s/p tPA   CKD (chronic kidney disease), stage IIIa   Occlusion of carotid stent, subsequent encounter   Acute blood loss anemia   Hypokalemia   Acute lower UTI Mood stabilization Hypertension Incidental 2 cm left thyroid nodule Hearing loss  Discharged Condition: Stable  Significant Diagnostic Studies: CT Angio Head W or Wo Contrast  Result Date: 06/03/2021 CLINICAL DATA:  Right-sided weakness EXAM: CT ANGIOGRAPHY HEAD AND NECK TECHNIQUE: Multidetector CT imaging of the head and neck was performed using the standard protocol during bolus administration of intravenous contrast. Multiplanar CT image reconstructions and MIPs were obtained to evaluate the vascular anatomy. Carotid stenosis measurements (when applicable) are obtained utilizing NASCET criteria, using the distal internal carotid diameter as the denominator. CONTRAST:  OMNIPAQUE IOHEXOL 350 MG/ML SOLN COMPARISON:  11/14/2019 CTA head neck FINDINGS: CTA NECK FINDINGS SKELETON: There is no bony spinal canal stenosis. No lytic or blastic lesion. OTHER NECK: 2.0 cm left thyroid nodule. UPPER CHEST: No pneumothorax or pleural effusion. No nodules or masses. AORTIC ARCH: There is soft and calcific atherosclerosis of the aortic arch. There is no aneurysm, dissection or hemodynamically significant stenosis of the visualized portion of the aorta. Conventional 3 vessel aortic branching pattern. The visualized proximal subclavian arteries are widely patent. RIGHT CAROTID SYSTEM: Normal without aneurysm, dissection or stenosis. LEFT CAROTID SYSTEM: No dissection, occlusion or aneurysm. Mild atherosclerotic calcification at the carotid  bifurcation without hemodynamically significant stenosis. VERTEBRAL ARTERIES: Left dominant configuration. Both origins are clearly patent. There is no dissection, occlusion or flow-limiting stenosis to the skull base (V1-V3 segments). CTA HEAD FINDINGS POSTERIOR CIRCULATION: --Vertebral arteries: Normal V4 segments. --Inferior cerebellar arteries: Normal. --Basilar artery: Normal. --Superior cerebellar arteries: Normal. --Posterior cerebral arteries (PCA): Normal. ANTERIOR CIRCULATION: --Intracranial internal carotid arteries: Normal. --Anterior cerebral arteries (ACA): Normal. Both A1 segments are present. Patent anterior communicating artery (a-comm). --Middle cerebral arteries (MCA): Normal. VENOUS SINUSES: As permitted by contrast timing, patent. ANATOMIC VARIANTS: None Review of the MIP images confirms the above findings. IMPRESSION: 1. No emergent large vessel occlusion or high-grade stenosis of the intracranial or cervical arteries. 2. 2 cm left thyroid nodule. Recommend thyroid US (ref: J Am Coll Radiol. 2015 Feb;12(2): 143-50). Aortic Atherosclerosis (ICD10-I70.0). Electronically Signed   By: Deatra Robinson M.D.   On: 06/03/2021 22:33   MR BRAIN WO CONTRAST  Result Date: 06/05/2021 CLINICAL DATA:  Follow-up examination for acute stroke. EXAM: MRI HEAD WITHOUT CONTRAST TECHNIQUE: Multiplanar, multiecho pulse sequences of the brain and surrounding structures were obtained without intravenous contrast. COMPARISON:  Prior CT from 06/03/2021. FINDINGS: Brain: Diffuse prominence of the CSF containing spaces compatible generalized age-related cerebral atrophy. Patchy and confluent T2/FLAIR hyperintensity within the periventricular and deep white matter both cerebral hemispheres most consistent with chronic small vessel ischemic disease, moderate to advanced in nature. Few scattered superimposed remote lacunar infarcts noted about the left hemispheric cerebral white matter. Patchy areas of restricted diffusion  seen involving the cortical and subcortical aspect of the left parieto-occipital region, consistent with acute ischemic infarcts (series 3, images 42-30). Extension to involve the left periatrial white matter. Associated susceptibility artifact consistent with petechial hemorrhage without frank intraparenchymal hematoma or hemorrhagic transformation. No significant regional mass effect. No other evidence for acute or  subacute ischemia. Gray-white matter differentiation otherwise maintained. No other areas of remote cortical infarction. Few additional chronic micro hemorrhages noted, nonspecific, but likely small vessel related/hypertensive in nature. No mass lesion, midline shift or mass effect. No hydrocephalus or extra-axial fluid collection. Partially empty sella noted. Suprasellar region normal. Midline structures intact. Vascular: Major intracranial vascular flow voids are maintained. Skull and upper cervical spine: Craniocervical junction within normal limits. Bone marrow signal intensity normal. No scalp soft tissue abnormality. Sinuses/Orbits: Patient status post bilateral ocular lens replacement. Paranasal sinuses are clear. Chronic appearing left mastoid effusion noted. Other: None. IMPRESSION: 1. Patchy acute ischemic infarcts involving the left parieto-occipital region as above. Associated petechial hemorrhage without frank intraparenchymal hematoma. No significant regional mass effect. 2. Underlying age-related cerebral atrophy with moderate to advanced chronic microvascular ischemic disease. Electronically Signed   By: Rise MuBenjamin  McClintock M.D.   On: 06/05/2021 00:03   DG CHEST PORT 1 VIEW  Result Date: 06/04/2021 CLINICAL DATA:  TIA EXAM: PORTABLE CHEST 1 VIEW COMPARISON:  11/03/2019 FINDINGS: Heart size within normal limits. Implanted cardiac device is noted overlying the left chest. No pulmonary vascular congestion. Lungs are clear. Advanced degenerative changes of the shoulder. IMPRESSION: No  acute cardiopulmonary process. Electronically Signed   By: Acquanetta BellingFarhaan  Mir M.D.   On: 06/04/2021 12:21   ECHOCARDIOGRAM COMPLETE  Result Date: 06/05/2021    ECHOCARDIOGRAM REPORT   Patient Name:   Sammuel BailiffARLYN Shewell Date of Exam: 06/05/2021 Medical Rec #:  [id number]    Height:       62.0 in Accession #:    [id number]   Weight:       137.8 lb Date of Birth:  [birth year]   BSA:          1.632 m Patient Age:    74 years     BP:           161/91 mmHg Patient Gender: F            HR:           78 bpm. Exam Location:  Inpatient Procedure: 2D Echo, Color Doppler and Cardiac Doppler Indications:    TIA  History:        Patient has prior history of Echocardiogram examinations, most                 recent 11/14/2019. Risk Factors:Hypertension and Dyslipidemia.  Sonographer:    Irving BurtonEmily Senior RDCS Referring Phys: [phone] RONDELL A SMITH  Sonographer Comments: Technically difficult study, patient unable to follow commands to turn or breath hold. IMPRESSIONS  1. Left ventricular ejection fraction, by estimation, is 55 to 60%. The left ventricle has normal function. The left ventricle has no regional wall motion abnormalities. There is mild left ventricular hypertrophy. Left ventricular diastolic parameters are consistent with Grade I diastolic dysfunction (impaired relaxation).  2. Right ventricular systolic function is normal. The right ventricular size is normal. There is normal pulmonary artery systolic pressure. The estimated right ventricular systolic pressure is 20.3 mmHg.  3. The mitral valve is normal in structure. No evidence of mitral valve regurgitation. No evidence of mitral stenosis.  4. The aortic valve is tricuspid. Aortic valve regurgitation is not visualized. No aortic stenosis is present.  5. Aortic dilatation noted. 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  6. The inferior vena cava is normal in size with greater than 50% respiratory variability, suggesting right atrial pressure of 3 mmHg.  FINDINGS  Left Ventricle: Left ventricular ejection  fraction, by estimation, is 55 to 60%. The left ventricle has normal function. The left ventricle has no regional wall motion abnormalities. The left ventricular internal cavity size was normal in size. There is  mild left ventricular hypertrophy. Left ventricular diastolic parameters are consistent with Grade I diastolic dysfunction (impaired relaxation). Right Ventricle: The right ventricular size is normal. No increase in right ventricular wall thickness. Right ventricular systolic function is normal. There is normal pulmonary artery systolic pressure. The tricuspid regurgitant velocity is 2.08 m/s, and  with an assumed right atrial pressure of 3 mmHg, the estimated right ventricular systolic pressure is 20.3 mmHg. Left Atrium: Left atrial size was normal in size. Right Atrium: Right atrial size was normal in size. Pericardium: There is no evidence of pericardial effusion. Presence of pericardial fat pad. Mitral Valve: The mitral valve is normal in structure. No evidence of mitral valve regurgitation. No evidence of mitral valve stenosis. Tricuspid Valve: The tricuspid valve is normal in structure. Tricuspid valve regurgitation is trivial. Aortic Valve: The aortic valve is tricuspid. Aortic valve regurgitation is not visualized. No aortic stenosis is present. Pulmonic Valve: The pulmonic valve was not well visualized. Pulmonic valve regurgitation is trivial. Aorta: The aortic root is normal in size and structure and aortic dilatation noted. 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 Venous: The inferior vena cava is normal in size with greater than 50% respiratory variability, suggesting right atrial pressure of 3 mmHg. IAS/Shunts: The interatrial septum was not well visualized.  LEFT VENTRICLE PLAX 2D LVIDd:         4.40 cm  Diastology LVIDs:         2.90 cm  LV e' medial:    4.57 cm/s LV PW:         1.10 cm  LV E/e' medial:  12.1 LV IVS:         0.90 cm  LV e' lateral:   6.42 cm/s LVOT diam:     2.00 cm  LV E/e' lateral: 8.6 LV SV:         46 LV SV Index:   28 LVOT Area:     3.14 cm  RIGHT VENTRICLE RV S prime:     16.10 cm/s LEFT ATRIUM             Index       RIGHT ATRIUM           Index LA diam:        2.80 cm 1.72 cm/m  RA Area:     11.10 cm LA Vol (A2C):   34.9 ml 21.39 ml/m RA Volume:   22.90 ml  14.03 ml/m LA Vol (A4C):   50.0 ml 30.64 ml/m LA Biplane Vol: 44.7 ml 27.39 ml/m  AORTIC VALVE LVOT Vmax:   77.70 cm/s LVOT Vmean:  63.300 cm/s LVOT VTI:    0.147 m  AORTA Ao Root diam: 3.50 cm Ao Asc diam:  3.70 cm MITRAL VALVE               TRICUSPID VALVE MV Area (PHT): 3.93 cm    TR Peak grad:   17.3 mmHg MV Decel Time: 193 msec    TR Vmax:        208.00 cm/s MV E velocity: 55.30 cm/s MV A velocity: 81.80 cm/s  SHUNTS MV E/A ratio:  0.68        Systemic VTI:  0.15 m  Systemic Diam: 2.00 cm Epifanio Lesches MD Electronically signed by Epifanio Lesches MD Signature Date/Time: 06/05/2021/10:01:05 AM    Final    CUP PACEART REMOTE DEVICE CHECK  Result Date: 06/12/2021 ILR summary report received. Battery status OK. Normal device function. No new symptom, tachy, brady, or pause episodes. No new AF episodes. Monthly summary reports and ROV/PRN LR  CT HEAD CODE STROKE WO CONTRAST  Result Date: 06/03/2021 CLINICAL DATA:  Code stroke.  Right-sided weakness EXAM: CT HEAD WITHOUT CONTRAST TECHNIQUE: Contiguous axial images were obtained from the base of the skull through the vertex without intravenous contrast. COMPARISON:  None. FINDINGS: Brain: There is no mass, hemorrhage or extra-axial collection. There is generalized atrophy without lobar predilection. There is hypoattenuation of the periventricular white matter, most commonly indicating chronic ischemic microangiopathy. Vascular: No abnormal hyperdensity of the major intracranial arteries or dural venous sinuses. No intracranial atherosclerosis. Skull:  The visualized skull base, calvarium and extracranial soft tissues are normal. Sinuses/Orbits: No fluid levels or advanced mucosal thickening of the visualized paranasal sinuses. No mastoid or middle ear effusion. The orbits are normal. ASPECTS [facility] Stroke Program Early CT Score) - Ganglionic level infarction (caudate, lentiform nuclei, internal capsule, insula, M1-M3 cortex): 7 - Supraganglionic infarction (M4-M6 cortex): 3 Total score (0-10 with 10 being normal): 10 IMPRESSION: 1. Chronic ischemic microangiopathy and generalized atrophy without acute intracranial abnormality. 2. ASPECTS is 10. These results were called by telephone at the time of interpretation on 06/03/2021 at 10:07 pm to provider [facility] , who verbally acknowledged these results. Electronically Signed   By: Deatra Robinson M.D.   On: 06/03/2021 22:07   Structural Heart Procedure  Result Date: 06/08/2021 See surgical note for result.  CT ANGIO NECK CODE STROKE  Result Date: 06/03/2021 CLINICAL DATA:  Right-sided weakness EXAM: CT ANGIOGRAPHY HEAD AND NECK TECHNIQUE: Multidetector CT imaging of the head and neck was performed using the standard protocol during bolus administration of intravenous contrast. Multiplanar CT image reconstructions and MIPs were obtained to evaluate the vascular anatomy. Carotid stenosis measurements (when applicable) are obtained utilizing NASCET criteria, using the distal internal carotid diameter as the denominator. CONTRAST:  OMNIPAQUE IOHEXOL 350 MG/ML SOLN COMPARISON:  11/14/2019 CTA head neck FINDINGS: CTA NECK FINDINGS SKELETON: There is no bony spinal canal stenosis. No lytic or blastic lesion. OTHER NECK: 2.0 cm left thyroid nodule. UPPER CHEST: No pneumothorax or pleural effusion. No nodules or masses. AORTIC ARCH: There is soft and calcific atherosclerosis of the aortic arch. There is no aneurysm, dissection or hemodynamically significant stenosis of the visualized portion of the aorta.  Conventional 3 vessel aortic branching pattern. The visualized proximal subclavian arteries are widely patent. RIGHT CAROTID SYSTEM: Normal without aneurysm, dissection or stenosis. LEFT CAROTID SYSTEM: No dissection, occlusion or aneurysm. Mild atherosclerotic calcification at the carotid bifurcation without hemodynamically significant stenosis. VERTEBRAL ARTERIES: Left dominant configuration. Both origins are clearly patent. There is no dissection, occlusion or flow-limiting stenosis to the skull base (V1-V3 segments). CTA HEAD FINDINGS POSTERIOR CIRCULATION: --Vertebral arteries: Normal V4 segments. --Inferior cerebellar arteries: Normal. --Basilar artery: Normal. --Superior cerebellar arteries: Normal. --Posterior cerebral arteries (PCA): Normal. ANTERIOR CIRCULATION: --Intracranial internal carotid arteries: Normal. --Anterior cerebral arteries (ACA): Normal. Both A1 segments are present. Patent anterior communicating artery (a-comm). --Middle cerebral arteries (MCA): Normal. VENOUS SINUSES: As permitted by contrast timing, patent. ANATOMIC VARIANTS: None Review of the MIP images confirms the above findings. IMPRESSION: 1. No emergent large vessel occlusion or high-grade stenosis of the intracranial or cervical arteries.  2. 2 cm left thyroid nodule. Recommend thyroid US (ref: J Am Coll Radiol. 2015 Feb;12(2): 143-50). Aortic Atherosclerosis (ICD10-I70.0). Electronically Signed   By: Deatra Robinson M.D.   On: 06/03/2021 22:33   HYBRID OR IMAGING ([facility] ONLY)  Result Date: 06/08/2021 There is no interpretation for this exam.  This order is for images obtained during a surgical procedure.  Please See "Surgeries" Tab for more information regarding the procedure.    Labs:  Basic Metabolic Panel: Recent Labs  Lab 06/20/21 0515  NA 138  K 4.3  CL 106  CO2 26  GLUCOSE 120*  BUN 26*  CREATININE 1.26*  CALCIUM 8.8*    CBC: No results for input(s): WBC, NEUTROABS, HGB, HCT, MCV, PLT in the last 168  hours.  CBG: Recent Labs  Lab 06/20/21 2103  GLUCAP 99   Family history.  Mother with lung disease.  Denies any colon cancer esophageal cancer or rectal cancer  Brief HPI:   Denise Leon is a 75 y.o. limited English speaking right-handed female with history of asthma CKD stage III hypertension as well as patchy infarct posterior left frontal region status post tPA/loop recorder with right side residual weakness maintained on full-strength aspirin received inpatient rehab services 11/16/2019 to 12/04/2019 and discharged home ambulating with a rolling walker.  Lives with her daughter and son-in-law.  Daughter is a physical therapist.  Presented 06/04/2021 with right side numbness increasing weakness.  CT of the head showed chronic ischemic microangiopathy and generalized atrophy without acute abnormality.  CT angiogram head and neck showed aortic atherosclerosis and left ICA mixed plaque but no significant stenosis.  There was a 2 cm left thyroid nodule recommended thyroid ultrasound as outpatient.  MRI identified patchy acute ischemic infarct involving the left parieto-occipital region associated with petechial hemorrhage without frank intraparenchymal hematoma.  No significant mass-effect.  Vascular surgery consulted in regards to carotid atherosclerosis due to fact patient was symptomatic underwent transcarotid placement and stenting 06/08/2021 per Dr. Randie Heinz.  Maintained on aspirin 325 mg daily and Plavix 75 mg daily for CVA prophylaxis.  Therapy evaluations completed due to patient decreased functional mobility was admitted for a comprehensive rehab program.   Hospital Course: Tawn Fitzner was admitted to rehab 06/10/2021 for inpatient therapies to consist of PT, ST and OT at least three hours five days a week. Past admission physiatrist, therapy team and rehab RN have worked together to provide customized collaborative inpatient rehab.  Pertaining to patient's left parieto-occipital infarct secondary to  left ICA high risk plaque as well as history of CVA.  She had undergone transcarotid placement stenting 06/08/2021 per vascular surgery.  She remained on aspirin and Plavix therapy per neurology services with plan follow-up outpatient.  Pain managed with use of limited oxycodone.  Mood stabilization with Cymbalta emotional support provided she was attending full therapies.  CKD stage III creatinine 1.20-1.26.  Incidental finding of 2 cm left thyroid nodule follow-up outpatient ultrasound.  Blood pressure controlled and monitored with Norvasc adjusted will need outpatient follow-up.  Hospital course acute lower UTI started on empiric Keflex urine culture showing greater 100,000 E. coli.  Acute blood loss anemia latest hemoglobin 9.7 no bleeding episodes.   Blood pressures were monitored on TID basis and controlled     Rehab course: During patient's stay in rehab weekly team conferences were held to monitor patient's progress, set goals and discuss barriers to discharge. At admission, patient required minimal assist 50 feet rolling walker minimal assist sit to stand minimal assist  stand pivot transfers  Physical exam.  Blood pressure 127/68 pulse 69 temperature 98.2 respirations 13 oxygen saturations 100% room air Constitutional.  No acute distress.  Very hard of hearing HEENT Head.  Normocephalic and atraumatic Eyes.  Pupils round and reactive to light no discharge.nystagmus Neck.  Supple nontender no JVD without thyromegaly Cardiac regular rate rhythm not any extra sounds or murmur heard Abdomen.  Soft nontender positive bowel sounds without rebound Respiratory effort normal no respiratory distress without wheeze Skin.  Left anterior neck incision with some bruising no drainage. Musculoskeletal Comments.  Right upper extremity biceps 4 -/5 triceps 4 -/5 grip 3+/5 FA 2+/5 Left upper extremity 5 -/5 in same muscles Right lower extremity hip flexors 4 -/5 knee extension 4 -/5 dorsi and plantar  flexion 4/5 Left lower extremity 5 -/5 same muscles Neurologic.  Alert makes eye contact with examiner very hard of hearing there was a language barrier follows simple commands.  He/She  has had improvement in activity tolerance, balance, postural control as well as ability to compensate for deficits. He/She has had improvement in functional use RUE/LUE  and RLE/LLE as well as improvement in awareness.  Stand pivot and sit to stand transfers supervision ambulating with a rolling walker mild shuffling gait.  Ambulates to the chair bathroom contact-guard close supervision rolling walker clothing management minimal assist.  Speech therapy follow-up patient did need some max cues at times for recall for safety.  All issues discussed with family need for 24-hour supervision for safety.  Full family teaching completed plan discharged to home       Disposition: Discharged to home Discharge disposition: 01-Home or Self Care       Diet: Regular  Special Instructions: No driving smoking or alcohol  Medications at discharge 1.  Tylenol as needed 2.  Norvasc 10 mg p.o. daily 3.  Aspirin 325 mg p.o. daily 4.  Lipitor 80 mg p.o. daily 5.  Urecholine 10 mg p.o. 3 times daily x1 week 6.  Coreg 6.25 mg p.o. twice daily 7.  Plavix 75 mg p.o. daily 8.  Cymbalta 30 mg p.o. daily 9.Xalatan ophthalmic solution 0.005% 1 drop both eyes bedtime 10.  Oxycodone 5 mg every 12 hours as needed moderate pain 11  Protonix 40 mg p.o. daily 12.  Vitamin B12 1000 mcg p.o. daily  30-35 minutes were spent completing discharge summary and discharge planning  Discharge Instructions     Ambulatory referral to Neurology   Complete by: As directed    An appointment is requested in approximately: 4 weeks left parietal occipital CVA secondary to left ICA plaque   Ambulatory referral to Physical Medicine Rehab   Complete by: As directed    Moderate complexity follow-up 1 to 2 weeks left parietal occipital infarction  secondary to left ICA plaque        Follow-up Information     Kirsteins, Victorino Sparrow, MD Follow up.   Specialty: Physical Medicine and Rehabilitation Why: Office to call for appointment Contact information: [address] [place] [address] [place] [address] [phone]         Maeola Harman, MD Follow up.   Specialties: Vascular Surgery, Cardiology Why: Call for appointment Contact information: [address] [place] [address] [phone]                 Signed: Mcarthur Rossetti Cosandra Plouffe 06/23/2021, 5:19 AM

## 2021-06-22 NOTE — Progress Notes (Signed)
PROGRESS NOTE   Subjective/Complaints: No new issues. Appears comfortable  ROS: limited due to language/communication   Objective:   No results found. No results for input(s): WBC, HGB, HCT, PLT in the last 72 hours. Recent Labs    06/20/21 0515  NA 138  K 4.3  CL 106  CO2 26  GLUCOSE 120*  BUN 26*  CREATININE 1.26*  CALCIUM 8.8*    Intake/Output Summary (Last 24 hours) at 06/22/2021 1023 Last data filed at 06/22/2021 0945 Gross per 24 hour  Intake 360 ml  Output --  Net 360 ml        Physical Exam: Vital Signs Blood pressure 138/86, pulse 79, temperature 97.6 F (36.4 C), temperature source Oral, resp. rate 18, height 5\' 4"  (1.626 m), weight 65.2 kg, SpO2 99 %.  Constitutional: No distress . Vital signs reviewed. HEENT: NCAT, EOMI, oral membranes moist Neck: supple Cardiovascular: RRR without murmur. No JVD    Respiratory/Chest: CTA Bilaterally without wheezes or rales. Normal effort    GI/Abdomen: BS +, non-tender, non-distended Ext: no clubbing, cyanosis, or edema Psych: pleasant and cooperative  Skin: No evidence of breakdown, no evidence of rash Musculoskeletal: Full range of motion in all 4 extremities. No joint swelling Neuro: Alert, word finding deficits. Able to communicate thoughts with extra time. Some dysarthria  RUE 3-4/5 prox to 3 to 3+ distally. RLE 4-/5 prox to 4/5 distally    Assessment/Plan: 1. Functional deficits which require 3+ hours per day of interdisciplinary therapy in a comprehensive inpatient rehab setting. Physiatrist is providing close team supervision and 24 hour management of active medical problems listed below. Physiatrist and rehab team continue to assess barriers to discharge/monitor patient progress toward functional and medical goals  Care Tool:  Bathing    Body parts bathed by patient: Chest, Abdomen, Right upper leg, Left upper leg, Face, Left arm, Right arm    Body parts bathed by helper: Right arm, Left arm, Buttocks, Right lower leg, Left lower leg     Bathing assist Assist Level: Supervision/Verbal cueing     Upper Body Dressing/Undressing Upper body dressing   What is the patient wearing?: Pull over shirt    Upper body assist Assist Level: Minimal Assistance - Patient > 75%    Lower Body Dressing/Undressing Lower body dressing      What is the patient wearing?: Pants     Lower body assist Assist for lower body dressing: Minimal Assistance - Patient > 75%     Toileting Toileting    Toileting assist Assist for toileting: Minimal Assistance - Patient > 75%     Transfers Chair/bed transfer  Transfers assist     Chair/bed transfer assist level: Minimal Assistance - Patient > 75%     Locomotion Ambulation   Ambulation assist      Assist level: Minimal Assistance - Patient > 75% Assistive device: Walker-rolling Max distance: 65   Walk 10 feet activity   Assist     Assist level: Minimal Assistance - Patient > 75% Assistive device: Walker-rolling   Walk 50 feet activity   Assist    Assist level: Minimal Assistance - Patient > 75% Assistive device: Walker-rolling  Walk 150 feet activity   Assist Walk 150 feet activity did not occur: Safety/medical concerns         Walk 10 feet on uneven surface  activity   Assist Walk 10 feet on uneven surfaces activity did not occur: Safety/medical concerns         Wheelchair     Assist Is the patient using a wheelchair?: Yes Type of Wheelchair: Manual    Wheelchair assist level: Total Assistance - Patient < 25% Max wheelchair distance: 150 ft    Wheelchair 50 feet with 2 turns activity    Assist        Assist Level: Total Assistance - Patient < 25%   Wheelchair 150 feet activity     Assist      Assist Level: Total Assistance - Patient < 25%   Blood pressure 138/86, pulse 79, temperature 97.6 F (36.4 C), temperature  source Oral, resp. rate 18, height 5\' 4"  (1.626 m), weight 65.2 kg, SpO2 99 %.  Medical Problem List and Plan: 1.  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  Impaired mobility: -DVT/anticoagulation:  Mechanical: Sequential compression devices, below knee Bilateral lower extremities             -antiplatelet therapy: Continue aspirin 325 mg daily and Plavix 75 mg daily 3. Pain: Decrease Oxycodone to 5mg  q12H as needed 4. Depression: Continue Cymbalta 30 mg daily             -antipsychotic agents: N/A 5. Neuropsych: This patient is capable of making decisions on her own behalf. 6. Skin/Wound Care: Routine skin checks 7. Fluids/Electrolytes/Nutrition: encourage PO  Potassium 3.9 on 9/2, 4.3 on 9/6 nl Supplement with kdur daily, cont current dose  8.  Hyperlipidemia.  Continue Lipitor 9.  CKD stage III.Creatinine baseline 1.10-1.22  Creatinine 1.20 on 9/2 , stable at 1.26 on 9/6 10.  Incidental 2 cm left thyroid nodule.  Follow-up outpatient 11. Hypertension. Continue Norvasc 5mg  daily and Coreg 6.25.mg daily PTA and resume as needed. Norvasc restarted, increased to 7.5 on 8/31 Coreg restarted Hydralazine 25 mg q8 hours prn for SBP >180 and DBP >100.  Vitals:   06/22/21 0554 06/22/21 0906  BP: (!) 144/82 138/86  Pulse: 79 79  Resp: 18   Temp: 97.6 F (36.4 C)   SpO2: 99%    Fair control 9/8 cont current meds  12.  Acute lower UTI-  UA positive- start empiric Keflex.  Urine culture showing greater than 100,000 E. Coli- sensitive to Keflex. Cont for 7d rx  13. Constipation  Improving 14. ABLA: hgb trending down post-op  -iron supplement  -surgical site appears improved  Hemoglobin 9.7 on 8/31, improving  Continue to monitor  15.  Urinary retention likely due to oxybutnin --resolved Bladder scan with 400 9/4 AM, off ditropan ,   LOS: 12 days A FACE TO FACE EVALUATION WAS PERFORMED  11/8 06/22/2021, 10:23 AM

## 2021-06-22 NOTE — Progress Notes (Signed)
Occupational Therapy Session Note  Patient Details  Name: Denise Leon MRN: [id number] Date of Birth: Apr 13, 1946  Today's Date: 06/22/2021 OT Individual Time: 9191-6606 OT Individual Time Calculation (min): 68 min    Short Term Goals: Week 2:  OT Short Term Goal 1 (Week 2): STG = LTG 2/2 ELOS  Skilled Therapeutic Interventions/Progress Updates:    Pt received semi-reclined in bed, denies pain, agreeable to therapy. Session focus on self-care retraining, activity tolerance, RUE NMR, func transfers in prep for improved ADL/IADL/func mobility performance + decreased caregiver burden. Came to sitting EOB with mod A to lift trunk. Short amb transfer > w/c with CGA + RW and min tactile cues for R hand placement on walker. Stand-pivot throughout session same manner as just described x2 w/c <>mat table. Seated with RUE, completed 1x20 forward and lateral towel slides, shoulder flexion against tilted stool, and retrieving/placing velcro cards from various heights. Required mod physical assist to facilitate full R shoulder flexion/elbow extension and to prevent trunk compensation. Req min to mod visual cues for matching cards accurately.   Practiced stepping over/backwards 4 inch wooden barrier x5 in prep for walk-in shower transfer, good clearance of R foot and req only CGA for balance.   Amb ~20 ft back to room and able to complete oral care and grooming in standing with CGA + RW.  Completed 1x10 putty squeezes, practiced rolling log shapes, and removed 10 beads from medium resistive theraputty with R hand, req frequent cues to only use R vs L hand.  Returned to supine with min A to progress BLE onto bed.   Pt left semi-reclined in bed with bed alarm engaged, call bell in reach, and all immediate needs met.    Therapy Documentation Precautions:  Precautions Precautions: Fall Precaution Comments: R hemi with c/o n/t; HOH with R better than L, speaks some English Restrictions Weight Bearing  Restrictions: No  Pain: denies, ongoing RUE "numbness"   ADL: See Care Tool for more details.  Therapy/Group: Individual Therapy  Volanda Napoleon MS, OTR/L  06/22/2021, 6:40 AM

## 2021-06-22 NOTE — Progress Notes (Signed)
Speech Language Pathology Discharge Summary  Patient Details  Name: Denise Leon MRN: [id number] Date of Birth: May 26, 1946  Today's Date: 06/23/2021 SLP Individual Time: 0900-1000 SLP Individual Time Calculation (min): 60 min  Skilled Therapeutic Interventions: Patient agreeable to skilled ST intervention with focus on cognitive goals. Daughter was not present for scheduled education session. SLP facilitated orientation with sup A verbal cues to use external orientation aids to orient to time and place. Patient was oriented to person and situation independent of cues. Facilitated spaced retrieval therapy involving recall of 3 novel words. Patient most successful following 30 second delay by recalling 2/3 words during 4th repetition and progressed to 3/3 words given mod A cues - field of 2 choices. Unable to recall any words following 1 minute delay despite previous repetitions. Patient benefited from increased processing time for cognitive tasks and responding to conversation regarding her family and personal interests. She occasionally responded "I can't remember." Follow up SLP services in [facility] setting may be beneficial to further assess patient in her home environment and in the presence of her family to corroborate baseline. Patient was left in bed  with alarm activated and immediate needs within reach at end of session. Continue per current plan of care.    Patient has met 2 of 4 long term goals.  Patient to discharge at overall Min;Supervision level.  Reasons goals not met: Baseline deficits, inconsistent participation   Clinical Impression/Discharge Summary:  Patient has made slow gains and has met 1 of 3 long-term goals this admission, however demonstrated functional progress toward short-term goals throughout admission. Progress limited secondary to deficits from prior CVA and patient being near baseline level function from a cognitive perspective per daughter report near beginning of this  admission. Patient is currently an overall sup-to-min A for basic cognitive tasks and requires sup A verbal cues for utilization of external orientation and memory aids. Patient requires increased support (mod-to-max) for working memory, sustained attention, short-term recall, and higher level problem solving. She benefits from extended processing time and repetition of questions and commands, although this may attribute to hearing impairment and language differences. Patient preferred to speak English during most sessions and denied interest in using [employer] video interpretation in native language of Tagalog. Per phone conversation with pt's daughter, she endorsed memory as her most significant challenge at baseline which is consistent with presentation this admission. Daughter was not present during scheduled family education session with ST, however present during with PT. Patient's care partner is independent to provide the necessary physical and cognitive assistance at discharge. F/u [facility] SLP services may be beneficial to further assess patient upon discharge to home environment while accompanied by family to corroborate baseline.   Care Partner:  Caregiver Able to Provide Assistance: Yes  Type of Caregiver Assistance: Cognitive;Physical  Recommendation:  24 hour supervision/assistance;Home Health SLP  Rationale for SLP Follow Up: Maximize cognitive function and independence;Reduce caregiver burden   Equipment: NA   Reasons for discharge: Discharged from hospital;Treatment goals met   Patient/Family Agrees with Progress Made and Goals Achieved: Yes    Sherrel Shafer T Kahiau Schewe 06/23/2021, 4:02 PM

## 2021-06-22 NOTE — Progress Notes (Signed)
Physical Therapy Session Note  Patient Details  Name: Anaia Frith MRN: [id number] Date of Birth: 1946/03/24  Today's Date: 06/22/2021 PT Individual Time: 1502-1556 PT Individual Time Calculation (min): 54 min   Short Term Goals: Week 2:  PT Short Term Goal 1 (Week 2): STG=LTG due to ELOS  Skilled Therapeutic Interventions/Progress Updates:    Patient in supine and seen without interpreter.  Use of gestures, demonstration and repeated multimodal cues used throughout session.  Patient supine to sit with min A and cues for technique.  Transferred to w/c min A stand pivot.  Patient assisted in w/c to therapy gym.  Performed sit to stand to RW with CGA and ambulated to mat x 7' with CGA.  Performed Berg balance assessment as noted below with score 19/56.  Patient requesting to toilet so assisted in w/c to room to bathroom.  Stand pivot with min A using grabbar to toilet with A for balance while pt doffed pants.  Patient performed hygiene seated, sit to stand min A and standing with A for balance while placing clean brief min A overall for toileting.  Patient pivot to w/c with min A. Patient assisted in w/c to therapy gym to complete balance assessment.   Patient ambulated with RW x 50' with increased time, shuffling steps and A for R hand placement on walker.  Performed forward then side stepping over cones in parallel bars with UE support and min A cues for foot clearance knocking over cones about 30% of the time.  Patient c/o fatigue so ambulated to w/c x 30' with min A with RW.  Assisted in w/c to room and patient stepped to bed with RW and min A.  Returned to supine with min A for L LE.  Left with call bell and needs in reach and bed alarm active.   Therapy Documentation Precautions:  Precautions Precautions: Fall Precaution Comments: R hemi with c/o n/t; HOH with R better than L, speaks some English Restrictions Weight Bearing Restrictions: No  Pain: Pain Assessment Pain Score: 0-No  pain  Balance: Standardized Balance Assessment Standardized Balance Assessment: Berg Balance Test Berg Balance Test Sit to Stand: Needs minimal aid to stand or to stabilize Standing Unsupported: Able to stand 2 minutes with supervision Sitting with Back Unsupported but Feet Supported on Floor or Stool: Able to sit safely and securely 2 minutes Stand to Sit: Controls descent by using hands Transfers: Able to transfer with verbal cueing and /or supervision Standing Unsupported with Eyes Closed: Able to stand 3 seconds Standing Ubsupported with Feet Together: Needs help to attain position but able to stand for 30 seconds with feet together From Standing, Reach Forward with Outstretched Arm: Reaches forward but needs supervision From Standing Position, Pick up Object from Floor: Unable to try/needs assist to keep balance From Standing Position, Turn to Look Behind Over each Shoulder: Needs supervision when turning Turn 360 Degrees: Needs assistance while turning Standing Unsupported, Alternately Place Feet on Step/Stool: Needs assistance to keep from falling or unable to try Standing Unsupported, One Foot in Front: Needs help to step but can hold 15 seconds Standing on One Leg: Unable to try or needs assist to prevent fall Total Score: 19   Therapy/Group: Individual Therapy  Elray Mcgregor Krupp, PT 06/22/2021, 5:04 PM

## 2021-06-23 DIAGNOSIS — I6300 Cerebral infarction due to thrombosis of unspecified precerebral artery: Secondary | ICD-10-CM | POA: Diagnosis not present

## 2021-06-23 DIAGNOSIS — N1831 Chronic kidney disease, stage 3a: Secondary | ICD-10-CM | POA: Diagnosis not present

## 2021-06-23 MED ORDER — CLOPIDOGREL BISULFATE 75 MG PO TABS: 75.0000 mg | ORAL_TABLET | Freq: Every day | ORAL | 0 refills | Status: DC

## 2021-06-23 MED ORDER — OXYCODONE HCL 5 MG PO TABS: 5.0000 mg | ORAL_TABLET | Freq: Two times a day (BID) | ORAL | 0 refills | Status: DC | PRN

## 2021-06-23 MED ORDER — ACETAMINOPHEN 325 MG PO TABS: 650.0000 mg | ORAL_TABLET | ORAL | Status: AC | PRN

## 2021-06-23 MED ORDER — CARVEDILOL 6.25 MG PO TABS: 6.2500 mg | ORAL_TABLET | Freq: Two times a day (BID) | ORAL | 3 refills | Status: DC

## 2021-06-23 MED ORDER — DOCUSATE SODIUM 100 MG PO CAPS: 100.0000 mg | ORAL_CAPSULE | Freq: Two times a day (BID) | ORAL | 0 refills | Status: AC

## 2021-06-23 MED ORDER — ATORVASTATIN CALCIUM 80 MG PO TABS: 80.0000 mg | ORAL_TABLET | Freq: Every day | ORAL | 0 refills | Status: DC

## 2021-06-23 MED ORDER — BETHANECHOL CHLORIDE 10 MG PO TABS: 10.0000 mg | ORAL_TABLET | Freq: Three times a day (TID) | ORAL | 0 refills | Status: DC

## 2021-06-23 MED ORDER — PANTOPRAZOLE SODIUM 40 MG PO TBEC
40.0000 mg | DELAYED_RELEASE_TABLET | Freq: Every day | ORAL | 0 refills | Status: DC
Start: 2021-06-23 — End: 2021-08-17

## 2021-06-23 MED ORDER — AMLODIPINE BESYLATE 10 MG PO TABS: 10.0000 mg | ORAL_TABLET | Freq: Every day | ORAL | 0 refills | Status: AC

## 2021-06-23 MED ORDER — VITAMIN C 500 MG PO TABS: 500.0000 mg | ORAL_TABLET | Freq: Every day | ORAL | 0 refills | Status: AC

## 2021-06-23 MED ORDER — CYANOCOBALAMIN 1000 MCG PO TABS: 1000.0000 ug | ORAL_TABLET | Freq: Every day | ORAL | 0 refills | Status: AC

## 2021-06-23 MED ORDER — DULOXETINE HCL 30 MG PO CPEP: 30.0000 mg | ORAL_CAPSULE | Freq: Every day | ORAL | 3 refills | Status: AC

## 2021-06-23 MED ORDER — VITAMIN A 2400 MCG (8000 UT) PO CAPS: 2400.0000 ug | ORAL_CAPSULE | Freq: Every day | ORAL | 0 refills | Status: AC

## 2021-06-23 NOTE — Plan of Care (Signed)

## 2021-06-23 NOTE — Progress Notes (Signed)
Occupational Therapy Discharge Summary  Patient Details  Name: Denise Leon MRN: [id number] Date of Birth: [birth year]  Today's Date: 06/23/2021 OT Individual Time: 1572-6203 OT Individual Time Calculation (min): 24 min + 24 min   Patient has met 9 of 9 long term goals due to improved activity tolerance, improved balance, postural control, ability to compensate for deficits, functional use of  RIGHT upper extremity, improved awareness, and improved coordination.  Patient to discharge at [facility] Assist level.  Patient's care partner unavailable to provide the necessary physical and cognitive assistance at discharge. Per SW, family available to provide 24/7 S, did not attend scheduled family education so unable to confirm.    Reasons goals not met: NA  Recommendation:  Patient will benefit from ongoing skilled OT services in home health setting to continue to advance functional skills in the area of BADL, iADL, and Reduce care partner burden.  Equipment: No equipment provided  Reasons for discharge: treatment goals met and discharge from hospital  Patient/family agrees with progress made and goals achieved: Yes  OT Discharge Precautions/Restrictions  Precautions Precautions: Fall Precaution Comments: R hemi with c/o n/t; HOH with R better than L, speaks some English Restrictions Weight Bearing Restrictions: No General   Vital Signs Therapy Vitals Temp: 98.7 F (37.1 C) Temp Source: Oral Pulse Rate: 78 Resp: 16 BP: 114/65 Patient Position (if appropriate): Lying Oxygen Therapy SpO2: 100 % O2 Device: Room Air Pain Pain Assessment Pain Scale: Faces Pain Score: 0-No pain ADL ADL Eating: Independent Where Assessed-Eating: Wheelchair, Chair, Bed level Grooming: Supervision/safety Where Assessed-Grooming: Standing at sink Upper Body Bathing: Supervision/safety Where Assessed-Upper Body Bathing: Edge of bed Lower Body Bathing: Minimal assistance Where Assessed-Lower  Body Bathing: Edge of bed Upper Body Dressing: Supervision/safety Where Assessed-Upper Body Dressing: Edge of bed Lower Body Dressing: Minimal assistance Where Assessed-Lower Body Dressing: Edge of bed Toileting: Minimal assistance Where Assessed-Toileting: Glass blower/designer: Close supervision Toilet Transfer Method: Counselling psychologist: Energy manager: Not assessed Social research officer, government: Curator Method: Ambulating Vision Baseline Vision/History: 1 Wears glasses Patient Visual Report: No change from baseline Vision Assessment?: Yes Eye Alignment: Within Functional Limits Alignment/Gaze Preference: Within Defined Limits Tracking/Visual Pursuits: Decreased smoothness of eye movement to LEFT superior field;Decreased smoothness of eye movement to LEFT inferior field Saccades: Decreased speed of saccadic movement Convergence: Within functional limits Visual Fields: No apparent deficits Additional Comments: Difficult to fully assess 2/2 expressive langauge deficits/language barrier. Perception  Perception: Impaired Inattention/Neglect: Does not attend to right side of body (cont to req cues for R hand placement on RW) Praxis Praxis: Intact Cognition Overall Cognitive Status: No family/caregiver present to determine baseline cognitive functioning Arousal/Alertness: Awake/alert Orientation Level: Oriented X4 Year: 2022 Month: August (given choice of two) Day of Week: Incorrect Memory: Impaired Immediate Memory Recall: Sock;Blue;Bed (req step by step repetition) Memory Recall Sock: Not able to recall Memory Recall Blue: Not able to recall Memory Recall Bed: Not able to recall Problem Solving: Impaired Safety/Judgment: Appears intact Sensation Sensation Light Touch: Appears Intact Hot/Cold: Appears Intact Proprioception: Impaired Detail Proprioception Impaired Details: Absent RUE;Impaired RLE Stereognosis:  Appears Intact Coordination Gross Motor Movements are Fluid and Coordinated: No Fine Motor Movements are Fluid and Coordinated: No Coordination and Movement Description: R hemi UE>LE Finger Nose Finger Test: decreased speed and accuracy on the Rt side compared to the Lt side Motor  Motor Motor: Hemiplegia;Abnormal tone Mobility  Bed Mobility Bed Mobility: Sit to Supine;Supine to Sit Supine to Sit:  Supervision/Verbal cueing Sit to Supine: Supervision/Verbal cueing Transfers Sit to Stand: Supervision/Verbal cueing Stand to Sit: Supervision/Verbal cueing  Trunk/Postural Assessment  Cervical Assessment Cervical Assessment: Within Functional Limits Thoracic Assessment Thoracic Assessment: Exceptions to [facility] (mild kyphosis) Lumbar Assessment Lumbar Assessment: Exceptions to [facility] (posterior pelvic tilt) Postural Control Postural Control: Within Functional Limits  Balance Balance Balance Assessed: Yes Static Sitting Balance Static Sitting - Balance Support: Feet supported Static Sitting - Level of Assistance: 6: Modified independent (Device/Increase time) Dynamic Sitting Balance Dynamic Sitting - Balance Support: No upper extremity supported;Feet unsupported Dynamic Sitting - Level of Assistance: 5: Stand by assistance Dynamic Sitting Balance - Compensations: S Static Standing Balance Static Standing - Balance Support: Right upper extremity supported;During functional activity Static Standing - Level of Assistance: 5: Stand by assistance Static Standing - Comment/# of Minutes: S Dynamic Standing Balance Dynamic Standing - Balance Support: Bilateral upper extremity supported;During functional activity Dynamic Standing - Level of Assistance: 5: Stand by assistance Dynamic Standing - Comments: S Extremity/Trunk Assessment RUE Assessment RUE Assessment: Exceptions to [facility] Active Range of Motion (AROM) Comments: Active shoulder ROM <90 degrees, limited internal rotation wit pt unable  to use dominant Rt hand for posterior hygiene, also uses the nondominant left hand at dominant level during oral care + grooming tasks (also eating per pt report) RUE Body System: Neuro Brunstrum levels for arm and hand: Arm;Hand Brunstrum level for arm: Stage III Synergy is performed voluntarily Brunstrum level for hand: Stage V Independence from basic synergies LUE Assessment LUE Assessment: Within Functional Limits Session 1 ([id number]): Pt received side-lying in bed, denies pain, agreeable to therapy. Session focus on self-care retraining, activity tolerance, RUE NMR, func transfers in prep for improved ADL/IADL/func mobility performance + decreased caregiver burden. Family not present throughout for scheduled family education and pt unaware of DC tomorrow. Came to sitting EOB with S and increased time with use of bed features. Doffed shirt with cues to pull over head + donned shirt with min A to thread LUE x2 2/2 spilling toothpaste on first shirt. Doffed pants with S and cues for initiation, donned brief and pants with light min A to pull up in back 2/2 bulkiness of brief. Donned B shoes with mod A to don R shoe. Amb throughout session with S and RW + min Vcs for safe RW use/R hand placement. Completed oral care and grooming standing at sink with S. Completed toilet transfer with S and RW, light min A to pull up pants in back and cues to initiate pericare post continent void of bladder. Amb to sink to wash hands same manner as before. Completed DC reassessments as documented above. Amb to and from RN station with close S and RW, noted increase in fatigue upon return.   Seated, completed 2x10 of the following with RUE using a full water bottle as hand weight: biceps curls, shoulder flexion, hand taps on elevated surface, scapular elevation and retraction.   Pt left seated in w/c with EVS present, awaiting following SLP session, call bell in reach, and all immediate needs met.    Session 2  [phone]): Pt received semi-reclined in bed, no c/o pain throughout session, agreeable to therapy. Session focus on func transfers, RUE NMR, activity tolerance in prep for improved ADL/IADL/func mobility performance + decreased caregiver burden. Pt came to sitting EOB with increased time and multiple attempts with S. Donned B shoes with max A. Sit>stand and short ambulatory transfer with mod cuing for safe RW use as pt tends to  not place BUE onto RW before amb + S. Standing at high-low table for ~5 min, pt utilized RUE to slide rings on ring arc above shoulder height from various distances to encourage increased shoulder flexion. Pt cont to compensate with trunk to reach above chest height. Req to sit down. Seated, retrieved and removed medium sized pegs and placed into peg board at chest height with increased time and attempts to achieve pincer grasp/place. Short amb transfer > bed and returned to supine with S. Doffed shoes with S and cuing to initiate.   Pt left semi-reclined in bed with bed alarm engaged, call bell in reach, and all immediate needs met.    Volanda Napoleon MS, OTR/L  06/23/2021, 2:51 PM

## 2021-06-23 NOTE — Progress Notes (Signed)
Patient ID: Denise Leon, female   DOB: [birth year], 75 y.o.   MRN: [id number]  Pt daughter running behind. Will arrive about 10:15 AM for family EDU  Lavera Guise, [place] [phone]

## 2021-06-23 NOTE — Plan of Care (Signed)
  Problem: RH Problem Solving Goal: LTG Patient will demonstrate problem solving for (SLP) Description: LTG:  Patient will demonstrate problem solving for basic/complex daily situations with cues  (SLP) Outcome: Adequate for Discharge   Problem: RH Memory Goal: LTG Patient will demonstrate ability for day to day (SLP) Description: LTG:   Patient will demonstrate ability for day to day recall/carryover during cognitive/linguistic activities with assist  (SLP) Outcome: Adequate for Discharge   Problem: RH Cognition - SLP Goal: RH LTG Patient will demonstrate orientation with cues Description:  LTG:  Patient will demonstrate orientation to person/place/time/situation with cues (SLP)   Outcome: Completed/Met   Problem: RH Memory Goal: LTG Patient will use memory compensatory aids to (SLP) Description: LTG:  Patient will use memory compensatory aids to recall biographical/new, daily complex information with cues (SLP) Outcome: Completed/Met

## 2021-06-23 NOTE — Progress Notes (Signed)
Carelink Summary Report / Loop Recorder 

## 2021-06-23 NOTE — Progress Notes (Signed)
Inpatient Rehabilitation Care Coordinator Discharge Note   Patient Details  Name: Denise Leon MRN: [id number] Date of Birth: 12-15-1945   Discharge location: Home  Length of Stay: 13 Days  Discharge activity level:    Home/community participation: Daughter able to assist 24/7  Patient response DV:VOHYWV Literacy - How often do you need to have someone help you when you read instructions, pamphlets, or other written material from your doctor or pharmacy?: Often  Patient response PX:TGGYIR Isolation - How often do you feel lonely or isolated from those around you?: Never  Services provided included: MD, RD, PT, OT, SLP, RN, CM, TR, Pharmacy, Neuropsych, SW  Financial Services:  Field seismologist Utilized: Medicare    Choices offered to/list presented to: pt daughter (no preferance)  Follow-up services arranged:  Home Health Home Health Agency: [name]         Patient response to transportation need: Is the patient able to respond to transportation needs?: Yes In the past 12 months, has lack of transportation kept you from medical appointments or from getting medications?: No In the past 12 months, has lack of transportation kept you from meetings, work, or from getting things needed for daily living?: No    Comments (or additional information):  Patient/Family verbalized understanding of follow-up arrangements:  Yes  Individual responsible for coordination of the follow-up planChristy Sartorius, [phone]  Confirmed correct DME delivered: [name] 06/23/2021    [name]

## 2021-06-23 NOTE — Progress Notes (Addendum)
PROGRESS NOTE   Subjective/Complaints: Up in bed. No problems overnight. Comfortable ROS: limited due to language/communication   Objective:   No results found. No results for input(s): WBC, HGB, HCT, PLT in the last 72 hours. No results for input(s): NA, K, CL, CO2, GLUCOSE, BUN, CREATININE, CALCIUM in the last 72 hours.   Intake/Output Summary (Last 24 hours) at 06/23/2021 0912 Last data filed at 06/23/2021 0735 Gross per 24 hour  Intake 600 ml  Output --  Net 600 ml        Physical Exam: Vital Signs Blood pressure 106/65, pulse 83, temperature 98.3 F (36.8 C), temperature source Oral, resp. rate 14, height 5\' 4"  (1.626 m), weight 65.2 kg, SpO2 98 %.  Constitutional: No distress . Vital signs reviewed. HEENT: NCAT, EOMI, oral membranes moist Neck: supple Cardiovascular: RRR without murmur. No JVD    Respiratory/Chest: CTA Bilaterally without wheezes or rales. Normal effort    GI/Abdomen: BS +, non-tender, non-distended Ext: no clubbing, cyanosis, or edema Psych: pleasant and cooperative  Skin: No evidence of breakdown, no evidence of rash Musculoskeletal: Full range of motion in all 4 extremities. No joint swelling Neuro: Alert, ongoing word finding deficits. Able to communicate thoughts with extra time. Some dysarthria  RUE 3-4/5 prox to 3 to 3+ distally. RLE 4-/5 prox to 4/5 distally    Assessment/Plan: 1. Functional deficits which require 3+ hours per day of interdisciplinary therapy in a comprehensive inpatient rehab setting. Physiatrist is providing close team supervision and 24 hour management of active medical problems listed below. Physiatrist and rehab team continue to assess barriers to discharge/monitor patient progress toward functional and medical goals  Care Tool:  Bathing    Body parts bathed by patient: Chest, Abdomen, Right upper leg, Left upper leg, Face, Left arm, Right arm   Body parts  bathed by helper: Right arm, Left arm, Buttocks, Right lower leg, Left lower leg     Bathing assist Assist Level: Supervision/Verbal cueing     Upper Body Dressing/Undressing Upper body dressing   What is the patient wearing?: Pull over shirt    Upper body assist Assist Level: Minimal Assistance - Patient > 75%    Lower Body Dressing/Undressing Lower body dressing      What is the patient wearing?: Pants     Lower body assist Assist for lower body dressing: Minimal Assistance - Patient > 75%     Toileting Toileting    Toileting assist Assist for toileting: Minimal Assistance - Patient > 75%     Transfers Chair/bed transfer  Transfers assist     Chair/bed transfer assist level: Minimal Assistance - Patient > 75%     Locomotion Ambulation   Ambulation assist      Assist level: Minimal Assistance - Patient > 75% Assistive device: Walker-rolling Max distance: 65   Walk 10 feet activity   Assist     Assist level: Minimal Assistance - Patient > 75% Assistive device: Walker-rolling   Walk 50 feet activity   Assist    Assist level: Minimal Assistance - Patient > 75% Assistive device: Walker-rolling    Walk 150 feet activity   Assist Walk 150 feet activity did  not occur: Safety/medical concerns         Walk 10 feet on uneven surface  activity   Assist Walk 10 feet on uneven surfaces activity did not occur: Safety/medical concerns         Wheelchair     Assist Is the patient using a wheelchair?: Yes Type of Wheelchair: Manual    Wheelchair assist level: Total Assistance - Patient < 25% Max wheelchair distance: 150 ft    Wheelchair 50 feet with 2 turns activity    Assist        Assist Level: Total Assistance - Patient < 25%   Wheelchair 150 feet activity     Assist      Assist Level: Total Assistance - Patient < 25%   Blood pressure 106/65, pulse 83, temperature 98.3 F (36.8 C), temperature source Oral,  resp. rate 14, height 5\' 4"  (1.626 m), weight 65.2 kg, SpO2 98 %.  Medical Problem List and Plan: 1.  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  Impaired mobility: -DVT/anticoagulation:  Mechanical: Sequential compression devices, below knee Bilateral lower extremities             -antiplatelet therapy: Continue aspirin 325 mg daily and Plavix 75 mg daily 3. Pain:  Oxycodone 5mg  q12H as needed 4. Depression: Continue Cymbalta 30 mg daily             -antipsychotic agents: N/A 5. Neuropsych: This patient is capable of making decisions on her own behalf. 6. Skin/Wound Care: Routine skin checks 7. Fluids/Electrolytes/Nutrition: encourage PO  Potassium 3.9 on 9/2, 4.3 on 9/6 nl Supplement with kdur daily, cont current dose  8.  Hyperlipidemia.  Continue Lipitor 9.  CKD stage III.Creatinine baseline 1.10-1.22  Creatinine 1.20 on 9/2 , stable at 1.26 on 9/6 10.  Incidental 2 cm left thyroid nodule.  Follow-up outpatient 11. Hypertension. Continue Norvasc 5mg  daily and Coreg 6.25.mg daily PTA and resume as needed. Norvasc restarted, increased to 7.5 on 8/31 Coreg restarted Hydralazine 25 mg q8 hours prn for SBP >180 and DBP >100.  Vitals:   06/23/21 0440 06/23/21 0748  BP: 132/78 106/65  Pulse: 80 83  Resp: 14   Temp: 98.3 F (36.8 C)   SpO2: 98%    Fair control 9/9 cont current meds  12.  Acute lower UTI-  UA positive- start empiric Keflex.  Urine culture showing greater than 100,000 E. Coli- sensitive to Keflex--completed 13. Constipation  Improved 14. ABLA: hgb trending down post-op  -iron supplement  -surgical site appears improved  Hemoglobin 9.7 on 8/31, improving  Continue to monitor  15. Urinary retention likely due to  oxybutnin --resolved Voiding with urecholine, can wean as outpt   LOS: 13 days A FACE TO FACE EVALUATION WAS PERFORMED  08/23/21 06/23/2021, 9:12 AM

## 2021-06-24 DIAGNOSIS — I6300 Cerebral infarction due to thrombosis of unspecified precerebral artery: Secondary | ICD-10-CM | POA: Diagnosis not present

## 2021-06-24 NOTE — Progress Notes (Signed)
Phoned daughter Shon Hale), regarding pt discharge today. Plan for discharge around 1pm. All questions answered appropriately. No further concerns. Mylo Red, LPN

## 2021-06-24 NOTE — Progress Notes (Signed)
Patient slept fairly well throughout the night. No complaints noted. Pt toileted with staff, continent of urine, no in and out caths required.

## 2021-06-24 NOTE — Progress Notes (Signed)
Physical Therapy Discharge Summary  Patient Details  Name: Denise Leon MRN: [id number] Date of Birth: Jun 29, 1946  Today's Date: 06/23/2021 PT Individual Time:  1105-1200  PT Individual Time Calculation (min): 55 min     Patient has met 9 of 9 long term goals due to improved activity tolerance, improved balance, improved postural control, increased strength, and functional use of  right upper extremity and right lower extremity.  Patient to discharge at an ambulatory level Supervision.   Patient's care partner is independent to provide the necessary physical assistance at discharge.  Reasons goals not met: n/a  Recommendation:  Patient will benefit from ongoing skilled PT services in home health setting to continue to advance safe functional mobility, address ongoing impairments in strength, coordination, balance, activity tolerance, cognition, safety awareness, and to minimize fall risk.  Equipment: No equipment provided -- pt has all DME from previous hospitalization  Reasons for discharge: treatment goals met and discharge from hospital  Patient/family agrees with progress made and goals achieved: Yes  PT Discharge Precautions/Restrictions Precautions Precautions: Fall Precaution Comments: R hemi with c/o n/t; HOH with R better than L, speaks tagalog with some ESL Vital Signs Therapy Vitals Temp: 98.6 F (37 C) Pulse Rate: 85 Resp: 20 BP: 119/74 Patient Position (if appropriate): Lying Oxygen Therapy SpO2: 99 % O2 Device: Room Air Pain Pain Assessment Pain Scale: 0-10 Pain Score: 0-No pain Pain Interference Pain Interference Pain Effect on Sleep: 1. Rarely or not at all Pain Interference with Therapy Activities: 1. Rarely or not at all Pain Interference with Day-to-Day Activities: 1. Rarely or not at all Vision/Perception  Vision - History Ability to See in Adequate Light: 1 Impaired Vision - Assessment Eye Alignment: Within Functional Limits Alignment/Gaze  Preference: Within Defined Limits Tracking/Visual Pursuits: Decreased smoothness of eye movement to LEFT superior field;Decreased smoothness of eye movement to LEFT inferior field Saccades: Decreased speed of saccadic movement Convergence: Within functional limits Perception Perception: Impaired Inattention/Neglect: Does not attend to right side of body Praxis Praxis: Intact Praxis Impairment Details: Motor planning  Cognition Overall Cognitive Status: Impaired/Different from baseline Arousal/Alertness: Awake/alert Orientation Level: Oriented to person;Oriented to place;Oriented to situation Sustained Attention: Impaired Memory: Impaired Awareness: Impaired Problem Solving: Impaired Executive Function: Sequencing;Initiating;Organizing Sequencing: Impaired Organizing: Impaired Initiating: Impaired Safety/Judgment: Appears intact Sensation Sensation Light Touch: Appears Intact (does c/o continued n/t thoughimproved from eval) Coordination Gross Motor Movements are Fluid and Coordinated: No Fine Motor Movements are Fluid and Coordinated: No Coordination and Movement Description: R hemi UE>LE Heel Shin Test: initiates and able to perform similar movements bilaterally but unable to correctly perform most likely d/t misunderstanding from language barrier Motor  Motor Motor: Other (comment) (hemipareisis) Motor - Discharge Observations: 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; improved from eval  Mobility Bed Mobility Bed Mobility: Sit to Supine;Supine to Sit Supine to Sit: Independent with assistive device Sit to Supine: Independent with assistive device Transfers Transfers: Sit to Stand;Stand Pivot Transfers;Stand to Sit Sit to Stand: Supervision/Verbal cueing;Independent with assistive device Stand to Sit: Supervision/Verbal cueing;Independent with assistive device Stand Pivot Transfers: Supervision/Verbal cueing;Independent with assistive device Stand Pivot Transfer  Details: Verbal cues for safe use of DME/AE;Verbal cues for precautions/safety Transfer (Assistive device): Rolling walker Locomotion  Gait Ambulation: Yes Gait Assistance: Supervision/Verbal cueing;Independent with assistive device Gait Distance (Feet): 115 Feet Assistive device: Rolling walker Gait Assistance Details: Tactile cues for weight shifting;Verbal cues for technique;Verbal cues for safe use of DME/AE;Verbal cues for gait pattern Gait Gait: Yes Gait Pattern: Step-through  pattern;Decreased step length - right;Decreased step length - left;Decreased stance time - left;Lateral trunk lean to right Gait velocity: decreased Stairs / Additional Locomotion Stairs: Yes Stairs Assistance: Supervision/Verbal cueing;Contact Guard/Touching assist Stair Management Technique: Two rails Number of Stairs: 8 Height of Stairs: 6 Ramp: Supervision/Verbal cueing Curb: Contact Guard/Touching assist Wheelchair Mobility Wheelchair Mobility: No  Trunk/Postural Assessment  Cervical Assessment Cervical Assessment: Within Functional Limits Thoracic Assessment Thoracic Assessment: Exceptions to [facility] (rounded shoulders/ mild kyphotic posture) Lumbar Assessment Lumbar Assessment: Exceptions to [facility] (posterior pelvic tilt) Postural Control Postural Control: Within Functional Limits  Balance Standardized Balance Assessment Standardized Balance Assessment: Berg Balance Test Berg Balance Test Sit to Stand: Needs minimal aid to stand or to stabilize Standing Unsupported: Able to stand 2 minutes with supervision Sitting with Back Unsupported but Feet Supported on Floor or Stool: Able to sit safely and securely 2 minutes Stand to Sit: Controls descent by using hands Transfers: Able to transfer with verbal cueing and /or supervision Standing Unsupported with Eyes Closed: Able to stand 3 seconds Standing Ubsupported with Feet Together: Needs help to attain position but able to stand for 30 seconds with  feet together From Standing, Reach Forward with Outstretched Arm: Reaches forward but needs supervision From Standing Position, Pick up Object from Floor: Unable to try/needs assist to keep balance From Standing Position, Turn to Look Behind Over each Shoulder: Needs supervision when turning Turn 360 Degrees: Needs assistance while turning Standing Unsupported, Alternately Place Feet on Step/Stool: Needs assistance to keep from falling or unable to try Standing Unsupported, One Foot in Front: Needs help to step but can hold 15 seconds Standing on One Leg: Unable to try or needs assist to prevent fall Total Score: 19 Static Sitting Balance Static Sitting - Balance Support: Feet supported Static Sitting - Level of Assistance: 6: Modified independent (Device/Increase time) Dynamic Sitting Balance Dynamic Sitting - Balance Support: No upper extremity supported;Feet unsupported Dynamic Sitting - Level of Assistance: 5: Stand by assistance Dynamic Sitting - Balance Activities: Lateral lean/weight shifting;Forward lean/weight shifting;Reaching for objects;Reaching across midline;Reaching for weighted objects Static Standing Balance Static Standing - Balance Support: Right upper extremity supported;During functional activity Static Standing - Level of Assistance: 5: Stand by assistance Dynamic Standing Balance Dynamic Standing - Balance Support: Bilateral upper extremity supported;During functional activity Dynamic Standing - Level of Assistance: 5: Stand by assistance Dynamic Standing - Balance Activities: Lateral lean/weight shifting;Forward lean/weight shifting;Reaching for objects;Reaching for weighted objects;Reaching across midline Extremity Assessment      RLE Assessment General Strength Comments: Grossly 4/5 proximal to distal LLE Assessment General Strength Comments: Grossly  4+/5 proximal to distal  PT instructed pt in Grad day assessment to measure progress toward goals. See below  for details.  Patient supine in bed on entrance to room. Dtr present. Patient alert and agreeable to PT session. Asked pt re: who the other person present in the room is and pt unable to relate until dtr speaks in [place]. Then pt relating that person is "Flor". And when asked, "Orland Mustard" is her daughter.   Dtr relates that her mother seems altered. Nsg notified although pt does not seem different than baseline presented at eval.   Patient with no pain complaint throughout session.  Therapeutic Activity: Bed Mobility: Patient performed supine <> sit with Mod I only requiring extra time and vc to initiate return to supine. Believed that once pt is back in her own home, she will be in regular environment and will not wait to be encouraged back  to bed.  Transfers: Patient performed sit<>stand and stand pivot transfers throughout session with Mod I using RW. Balance is good and pt only requires intermittent supervision when performing pivot turning to ensure proper RW mgmt.   Car transfer performed with close supervision and vc/ tc for proper technique.   Gait Training:  Patient ambulated 75 feet prior to standing rest break then add'l 40 feet using RW with close supervision/ Mod I with dtr present. Demonstrated need for intermittent cues for increased RLE step height. Asked dtr if she wanted to walk with pt, but dtr states that mother looks as tho she is walking at lower quality of gait than compared to after previous stroke and looks like she is CGA. Discussed with dtr pt's progress with ambulation since [facility] and ability to ambulate up to ~120 feet with encouragement and several standing rest breaks as pt continues to want to sit down d/t feeling "tired"  Pt guided in curb step training. Pt able to perform one 4" curb step using RW. Guided with vc/ tc and CGA for first pass for proper instruction. Progresses to close supervision with intermittent CGA and tc for safe mgmt of RW.   Patient supine  in bed  at end of session with brakes locked, bed alarm set, and all needs within reach.  Alger Simons 06/23/2021, 8:28 PM

## 2021-06-24 NOTE — Progress Notes (Signed)
PROGRESS NOTE   Subjective/Complaints: Rested well. No new complaints.  ROS: limited due to language/communication   Objective:   No results found. No results for input(s): WBC, HGB, HCT, PLT in the last 72 hours. No results for input(s): NA, K, CL, CO2, GLUCOSE, BUN, CREATININE, CALCIUM in the last 72 hours.   Intake/Output Summary (Last 24 hours) at 06/24/2021 0828 Last data filed at 06/23/2021 2109 Gross per 24 hour  Intake 660 ml  Output --  Net 660 ml        Physical Exam: Vital Signs Blood pressure 123/85, pulse 71, temperature 98.5 F (36.9 C), resp. rate 16, height 5\' 4"  (1.626 m), weight 65.2 kg, SpO2 100 %.  Constitutional: No distress . Vital signs reviewed. HEENT: NCAT, EOMI, oral membranes moist Neck: supple Cardiovascular: RRR without murmur. No JVD    Respiratory/Chest: CTA Bilaterally without wheezes or rales. Normal effort    GI/Abdomen: BS +, non-tender, non-distended Ext: no clubbing, cyanosis, or edema Psych: pleasant and cooperative      Skin: No evidence of breakdown, no evidence of rash Musculoskeletal: Full range of motion in all 4 extremities. No joint swelling Neuro: Alert, ongoing word finding deficits. Able to communicate thoughts with extra time. Some dysarthria  RUE 3-4/5 prox to 3 to 3+ distally. RLE 4-/5 prox to 4/5 distally    Assessment/Plan: 1. Functional deficits which require 3+ hours per day of interdisciplinary therapy in a comprehensive inpatient rehab setting. Physiatrist is providing close team supervision and 24 hour management of active medical problems listed below. Physiatrist and rehab team continue to assess barriers to discharge/monitor patient progress toward functional and medical goals  Care Tool:  Bathing    Body parts bathed by patient: Chest, Abdomen, Right upper leg, Left upper leg, Face, Left arm, Right arm   Body parts bathed by helper: Right arm, Left  arm, Buttocks, Right lower leg, Left lower leg     Bathing assist Assist Level: Supervision/Verbal cueing     Upper Body Dressing/Undressing Upper body dressing   What is the patient wearing?: Pull over shirt    Upper body assist Assist Level: Minimal Assistance - Patient > 75%    Lower Body Dressing/Undressing Lower body dressing      What is the patient wearing?: Pants     Lower body assist Assist for lower body dressing: Minimal Assistance - Patient > 75%     Toileting Toileting    Toileting assist Assist for toileting: Minimal Assistance - Patient > 75%     Transfers Chair/bed transfer  Transfers assist     Chair/bed transfer assist level: Minimal Assistance - Patient > 75%     Locomotion Ambulation   Ambulation assist      Assist level: Minimal Assistance - Patient > 75% Assistive device: Walker-rolling Max distance: 65   Walk 10 feet activity   Assist     Assist level: Minimal Assistance - Patient > 75% Assistive device: Walker-rolling   Walk 50 feet activity   Assist    Assist level: Minimal Assistance - Patient > 75% Assistive device: Walker-rolling    Walk 150 feet activity   Assist Walk 150 feet activity did  not occur: Safety/medical concerns         Walk 10 feet on uneven surface  activity   Assist Walk 10 feet on uneven surfaces activity did not occur: Safety/medical concerns         Wheelchair     Assist Is the patient using a wheelchair?: Yes Type of Wheelchair: Manual    Wheelchair assist level: Total Assistance - Patient < 25% Max wheelchair distance: 150 ft    Wheelchair 50 feet with 2 turns activity    Assist        Assist Level: Total Assistance - Patient < 25%   Wheelchair 150 feet activity     Assist      Assist Level: Total Assistance - Patient < 25%   Blood pressure 123/85, pulse 71, temperature 98.5 F (36.9 C), resp. rate 16, height 5\' 4"  (1.626 m), weight 65.2 kg, SpO2  100 %.  Medical Problem List and Plan: 1.  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  Impaired mobility: -DVT/anticoagulation:  Mechanical: Sequential compression devices, below knee Bilateral lower extremities             -antiplatelet therapy: Continue aspirin 325 mg daily and Plavix 75 mg daily 3. Pain:  Oxycodone 5mg  q12H as needed 4. Depression: Continue Cymbalta 30 mg daily             -antipsychotic agents: N/A 5. Neuropsych: This patient is capable of making decisions on her own behalf. 6. Skin/Wound Care: Routine skin checks 7. Fluids/Electrolytes/Nutrition: encourage PO  Potassium 3.9 on 9/2, 4.3 on 9/6 nl Supplement with kdur daily, cont current dose  8.  Hyperlipidemia.  Continue Lipitor 9.  CKD stage III.Creatinine baseline 1.10-1.22  Creatinine 1.20 on 9/2 , stable at 1.26 on 9/6 10.  Incidental 2 cm left thyroid nodule.  Follow-up outpatient 11. Hypertension. Continue Norvasc 5mg  daily and Coreg 6.25.mg daily PTA and resume as needed. Norvasc restarted, increased to 7.5 on 8/31 Coreg restarted Hydralazine 25 mg q8 hours prn for SBP >180 and DBP >100.  Vitals:   06/23/21 1954 06/24/21 0453  BP: 117/72 123/85  Pulse: 86 71  Resp: 16 16  Temp: 98.8 F (37.1 C) 98.5 F (36.9 C)  SpO2: 97% 100%   Fair control 9/10 cont current meds  12.  Acute lower UTI-  UA positive- start empiric Keflex.  Urine culture showing greater than 100,000 E. Coli- sensitive to Keflex--completed 13. Constipation  Improved 14. ABLA: hgb trending down post-op  -iron supplement  -surgical site appears improved  Hemoglobin 9.7 on 8/31, improving  Continue to monitor  15. Urinary retention likely due to oxybutnin --resolved Voiding with  urecholine, can wean as outpt   LOS: 14 days A FACE TO FACE EVALUATION WAS PERFORMED  08/23/21 06/24/2021, 8:28 AM

## 2021-06-24 NOTE — Plan of Care (Signed)
  Problem: RH Balance Goal: LTG Patient will maintain dynamic sitting balance (PT) Description: LTG:  Patient will maintain dynamic sitting balance with assistance during mobility activities (PT) Outcome: Completed/Met Flowsheets (Taken 06/11/2021 1626 by Grunenberg, Cherie L, PT) LTG: Pt will maintain dynamic sitting balance during mobility activities with:: Independent Goal: LTG Patient will maintain dynamic standing balance (PT) Description: LTG:  Patient will maintain dynamic standing balance with assistance during mobility activities (PT) Outcome: Completed/Met Flowsheets (Taken 06/24/2021 1329) LTG: Pt will maintain dynamic standing balance during mobility activities with:: Independent with assistive device    Problem: Sit to Stand Goal: LTG:  Patient will perform sit to stand with assistance level (PT) Description: LTG:  Patient will perform sit to stand with assistance level (PT) Outcome: Completed/Met   Problem: RH Bed Mobility Goal: LTG Patient will perform bed mobility with assist (PT) Description: LTG: Patient will perform bed mobility with assistance, with/without cues (PT). Outcome: Completed/Met Flowsheets (Taken 06/11/2021 1626 by Grunenberg, Cherie L, PT) LTG: Pt will perform bed mobility with assistance level of: Independent   Problem: RH Bed to Chair Transfers Goal: LTG Patient will perform bed/chair transfers w/assist (PT) Description: LTG: Patient will perform bed to chair transfers with assistance (PT). Outcome: Completed/Met Flowsheets (Taken 06/11/2021 1626 by Grunenberg, Cherie L, PT) LTG: Pt will perform Bed to Chair Transfers with assistance level: Supervision/Verbal cueing   Problem: RH Car Transfers Goal: LTG Patient will perform car transfers with assist (PT) Description: LTG: Patient will perform car transfers with assistance (PT). Outcome: Completed/Met Flowsheets (Taken 06/11/2021 1626 by Grunenberg, Cherie L, PT) LTG: Pt will perform car transfers with  assist:: Supervision/Verbal cueing   Problem: RH Stairs Goal: LTG Patient will ambulate up and down stairs w/assist (PT) Description: LTG: Patient will ambulate up and down # of stairs with assistance (PT) Outcome: Completed/Met Flowsheets (Taken 06/11/2021 1628 by Eustace Pen, Cherie L, PT) LTG: Pt will ambulate up/down stairs assist needed:: Contact Guard/Touching assist LTG: Pt will  ambulate up and down number of stairs: 8 steps using B rails for increased strength endurance with functional activity.   Problem: RH Ambulation Goal: LTG Patient will ambulate in controlled environment (PT) Description: LTG: Patient will ambulate in a controlled environment, # of feet with assistance (PT). Outcome: Completed/Met Flowsheets Taken 06/21/2021 0803 by Lorie Phenix, PT LTG: Ambulation distance in controlled environment: 115 Taken 06/11/2021 1628 by Apolinar Junes L, PT LTG: Pt will ambulate in controlled environ  assist needed:: Supervision/Verbal cueing Goal: LTG Patient will ambulate in home environment (PT) Description: LTG: Patient will ambulate in home environment, # of feet with assistance (PT). Outcome: Completed/Met Flowsheets Taken 06/21/2021 0803 by Lorie Phenix, PT LTG: Pt will ambulate in home environ  assist needed:: Supervision/Verbal cueing Taken 06/11/2021 1628 by Grunenberg, Cherie L, PT LTG: Ambulation distance in home environment: 50 ft using LRAD

## 2021-06-24 NOTE — Progress Notes (Signed)
Pt belongings gathered. Pt/family in agreement with discharge instructions. Pt discharged to private vehicle per wheelchair. No complications noted. Mylo Red, LPN

## 2021-06-26 ENCOUNTER — Telehealth: Payer: Self-pay

## 2021-06-26 NOTE — Telephone Encounter (Signed)
Gave Debbie from [facility] verbal okay to consult clinical social work and speech therapy.

## 2021-07-05 ENCOUNTER — Other Ambulatory Visit: Payer: Self-pay

## 2021-07-05 DIAGNOSIS — T82898D Other specified complication of vascular prosthetic devices, implants and grafts, subsequent encounter: Secondary | ICD-10-CM

## 2021-07-06 ENCOUNTER — Telehealth: Payer: Self-pay

## 2021-07-06 NOTE — Telephone Encounter (Signed)
Denise Leon from [facility] verbal orders for patient to have speech therapy 1 week 5.

## 2021-07-10 ENCOUNTER — Encounter (HOSPITAL_COMMUNITY): Payer: Medicare Other

## 2021-07-12 ENCOUNTER — Other Ambulatory Visit: Payer: Self-pay

## 2021-07-12 ENCOUNTER — Ambulatory Visit (INDEPENDENT_AMBULATORY_CARE_PROVIDER_SITE_OTHER): Payer: Medicare Other | Admitting: Physician Assistant

## 2021-07-12 ENCOUNTER — Ambulatory Visit (HOSPITAL_COMMUNITY)
Admission: RE | Admit: 2021-07-12 | Discharge: 2021-07-12 | Disposition: A | Discharge status: Home / Self Care | Payer: Medicare Other | Source: Ambulatory Visit | Attending: Vascular Surgery | Admitting: Vascular Surgery

## 2021-07-12 VITALS — BP 116/69 | HR 67 | Temp 98.0°F | Resp 20 | Ht 64.0 in | Wt 131.0 lb

## 2021-07-12 DIAGNOSIS — T82898D Other specified complication of vascular prosthetic devices, implants and grafts, subsequent encounter: Secondary | ICD-10-CM | POA: Insufficient documentation

## 2021-07-12 DIAGNOSIS — I6522 Occlusion and stenosis of left carotid artery: Secondary | ICD-10-CM

## 2021-07-12 NOTE — Progress Notes (Signed)
POST OPERATIVE OFFICE NOTE    CC:  F/u for surgery  HPI:  This is a 75 y.o. female who is s/p left TCAR on 06/08/2021 by Dr. Randie Heinz for symptomatic carotid artery stenosis.    Pt returns today for follow up accompanied by her daughter.  Pt states that she has done well.  Her right sided weakness has been improving and she continues to work with HHPT/OT.  Her daughter states a [facility] has been coming out to check  her incision.  She has not had any new events.    No Known Allergies  Current Outpatient Medications  Medication Sig Dispense Refill   acetaminophen (TYLENOL) 325 MG tablet Take 2 tablets (650 mg total) by mouth every 4 (four) hours as needed for mild pain (or temp > 37.5 C (99.5 F)).     amLODipine (NORVASC) 10 MG tablet Take 1 tablet (10 mg total) by mouth daily. 30 tablet 0   aspirin EC 325 MG EC tablet Take 1 tablet (325 mg total) by mouth daily. 30 tablet 0   atorvastatin (LIPITOR) 80 MG tablet Take 1 tablet (80 mg total) by mouth daily. 30 tablet 0   bethanechol (URECHOLINE) 10 MG tablet Take 1 tablet (10 mg total) by mouth 3 (three) times daily. 21 tablet 0   carvedilol (COREG) 6.25 MG tablet Take 1 tablet (6.25 mg total) by mouth 2 (two) times daily. 180 tablet 3   clopidogrel (PLAVIX) 75 MG tablet Take 1 tablet (75 mg total) by mouth daily. 30 tablet 0   docusate sodium (COLACE) 100 MG capsule Take 1 capsule (100 mg total) by mouth 2 (two) times daily. 10 capsule 0   DULoxetine (CYMBALTA) 30 MG capsule Take 1 capsule (30 mg total) by mouth daily. 30 capsule 3   latanoprost (XALATAN) 0.005 % ophthalmic solution Place 1 drop into both eyes at bedtime. 2.5 mL 12   oxyCODONE (OXY IR/ROXICODONE) 5 MG immediate release tablet Take 1 tablet (5 mg total) by mouth every 12 (twelve) hours as needed for moderate pain. 30 tablet 0   pantoprazole (PROTONIX) 40 MG tablet Take 1 tablet (40 mg total) by mouth daily. 30 tablet 0   Vitamin A 2400 MCG (8000 UT) CAPS Take 2,400 mcg by mouth  daily. 30 capsule 0   vitamin B-12 1000 MCG tablet Take 1 tablet (1,000 mcg total) by mouth daily. 30 tablet 0   vitamin C (ASCORBIC ACID) 500 MG tablet Take 1 tablet (500 mg total) by mouth daily. 30 tablet 0   No current facility-administered medications for this visit.     ROS:  See HPI  Physical Exam:  Today's Vitals   07/12/21 1139 07/12/21 1140  BP: 121/70 116/69  Pulse: 67   Resp: 20   Temp: 98 F (36.7 C)   TempSrc: Temporal   SpO2: 98%   Weight: 131 lb (59.4 kg)   Height: 5\' 4"  (1.626 m)    Body mass index is 22.49 kg/m.   Incision:  healed nicely.  Excess dermabond removed today.   Right groin is soft without hematoma Extremities:  5/5 grip strength bilaterally.    Carotid duplex 07/12/2021:u Summary:  Right Carotid: There is no evidence of stenosis in the right ICA.   Left Carotid: Widely patent distal common carotid to mid internal carotid  artery stent without evidence of stenosis.   Vertebrals:  Bilateral vertebral arteries demonstrate antegrade flow.  Subclavians: Normal flow hemodynamics were seen in bilateral subclavian arteries.   Assessment/Plan:  This is a 75 y.o. female who is s/p: Left TCAR 06/08/2021 by Dr. Randie Heinz  -pt doing well and strength continues to improve.  Carotid duplex today reveals patent left carotid artery stent.  Discussed with Dr. Randie Heinz and will have pt continue Plavix through January and then she can continue with aspirin only.  Continue statin. -ok for [facility] to be discontinued.   -f/u in 9 months with carotid duplex.   -they know to go to the ER if she has any neurologic symptoms and these were reviewed with the pt and daughter.    Doreatha Massed, [facility] [phone]   Clinic MD:  Randie Heinz

## 2021-07-13 ENCOUNTER — Other Ambulatory Visit: Payer: Self-pay

## 2021-07-13 ENCOUNTER — Telehealth: Payer: Self-pay

## 2021-07-13 DIAGNOSIS — I6522 Occlusion and stenosis of left carotid artery: Secondary | ICD-10-CM

## 2021-07-13 NOTE — Telephone Encounter (Signed)
[facility] speech therapist Almira Coaster calls today to report that patient missed visit today due to prior engagement.

## 2021-07-25 ENCOUNTER — Other Ambulatory Visit: Payer: Self-pay | Admitting: Vascular Surgery

## 2021-07-27 ENCOUNTER — Encounter: Payer: Medicare Other | Admitting: Physical Medicine & Rehabilitation

## 2021-07-28 ENCOUNTER — Telehealth: Payer: Self-pay

## 2021-07-28 NOTE — Telephone Encounter (Signed)
Gave verbal orders to [place] from [facility] for 2 week 3 and 1 week 1.

## 2021-08-17 ENCOUNTER — Encounter: Payer: Self-pay | Admitting: Adult Health

## 2021-08-17 ENCOUNTER — Telehealth: Payer: Self-pay | Admitting: *Deleted

## 2021-08-17 ENCOUNTER — Ambulatory Visit (INDEPENDENT_AMBULATORY_CARE_PROVIDER_SITE_OTHER): Payer: Medicare Other | Admitting: Adult Health

## 2021-08-17 VITALS — BP 122/84 | HR 72 | Ht 65.0 in | Wt 128.2 lb

## 2021-08-17 DIAGNOSIS — I69398 Other sequelae of cerebral infarction: Secondary | ICD-10-CM

## 2021-08-17 DIAGNOSIS — I69998 Other sequelae following unspecified cerebrovascular disease: Secondary | ICD-10-CM

## 2021-08-17 DIAGNOSIS — I63232 Cerebral infarction due to unspecified occlusion or stenosis of left carotid arteries: Secondary | ICD-10-CM

## 2021-08-17 DIAGNOSIS — I10 Essential (primary) hypertension: Secondary | ICD-10-CM | POA: Diagnosis not present

## 2021-08-17 DIAGNOSIS — R269 Unspecified abnormalities of gait and mobility: Secondary | ICD-10-CM

## 2021-08-17 DIAGNOSIS — E781 Pure hyperglyceridemia: Secondary | ICD-10-CM | POA: Diagnosis not present

## 2021-08-17 DIAGNOSIS — R531 Weakness: Secondary | ICD-10-CM

## 2021-08-17 DIAGNOSIS — I69319 Unspecified symptoms and signs involving cognitive functions following cerebral infarction: Secondary | ICD-10-CM

## 2021-08-17 MED ORDER — CARVEDILOL PHOSPHATE ER 10 MG PO CP24: 10.0000 mg | ORAL_CAPSULE | Freq: Every day | ORAL | 5 refills | Status: AC

## 2021-08-17 NOTE — Progress Notes (Signed)
[facility] [address] [place]. [place] [address] [phone]       HOSPITAL FOLLOW UP NOTE  [name] Date of Birth:  06-22-1946 Medical Record Number:  [id number]   Reason for Referral:  hospital stroke follow up    SUBJECTIVE:   CHIEF COMPLAINT:  Chief Complaint  Patient presents with   Follow-up    Rm 3 with daughter. Here for hospital follow up. PT reports she has been doing well, no concerns.     HPI:   Denise Leon is a 75 y.o. 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  Personally reviewed hospitalization pertinent progress notes, lab work and imaging.  Evaluated by Dr. Erlinda Hong for acute left parietal occipital infarct secondary to left ICA high risk s/p left TCAR procedure 8/25.  EF 55 to 60%.  Loop recorder interrogated -no A. fib.  LDL 131.  A1c 5.8.  Initiated DAPT per vascular surgery.  Resumed atorvastatin 80 mg daily.  Incidental finding 2cm left thyroid nodule -advised outpatient follow-up with PCP.  Therapy eval's recommended CIR for decreased functional mobility  Today, 08/17/2021, being seen today for hospital f/u accompanied by her daughter. She has been doing well. Still has some weakness in right arm although improving. Denies residual facial numbness. Short term memory slightly worsened since recent stroke compared to baseline. Recently completed home health therapies - has been continuing HEP with family. Continues to use RW - using prior. No recent falls. Lives with daughter - able to maintain ADLs independently. Denies new stroke/TIA symptoms. She remains on both Asprin and plavix - plans to stop plavix in Jan. per [facility]VS recommendations - denies bleeding or bruising concerns. Remains on  atorvastatin 80mg  daily - denies side effects. Prior LDL (05/2021) 84. Blood pressure today 122/84.  Routinely monitors at home.  Daughter concerned regarding carvedilol twice daily dosing as she will reportedly forget to take evening dose - she questions if this could be taken only once daily.  Has not yet had f/u visit with PCP.  Loop recorder has not shown atrial fibrillation thus far.  No further concerns at this time      PERTINENT IMAGING  CT head Chronic ischemic microangiopathy and generalized atrophy without acute intracranial abnormality. ASPECTS 10.    CTA head & neck aortic atherosclerosis and left ICA mixed plaques but no significant stenosis MRI Patchy acute ischemic infarcts involving the left parieto-occipital region as above. Associated petechial hemorrhage without frank intraparenchymal hematoma. age-related cerebral atrophy with moderate to advanced chronic microvascular ischemic disease. 2D Echo - EF 55 - 60%. No cardiac source of emboli identified.  Loop recorder no A fib LDL 131 HgbA1c 5.8        ROS:   14 system review of systems performed and negative with exception of those listed in HPI  PMH:  Past Medical History:  Diagnosis Date   Acute ischemic stroke (HCC) patchy L frontal infarcts s/p tPA 11/14/2019   Asthma    CKD (chronic kidney disease), stage IIIb 11/16/2019   Hyperlipidemia 11/16/2019   Hypertension    Left middle cerebral artery stroke ([place]) 11/16/2019   Palpitations 11/16/2019   Stroke ([place]) 11/14/2019   Thyroid nodule, L 11/16/2019    PSH:  Past Surgical History:  Procedure Laterality Date  HIP SURGERY     NECK SURGERY     TRANSCAROTID ARTERY REVASCULARIZATION  Left 06/08/2021   Procedure: LEFT TRANSCAROTID ARTERY REVASCULARIZATION;  Surgeon: Maeola Harman, MD;  Location: [facility] OR;  Service: Vascular;  Laterality: Left;   ULTRASOUND GUIDANCE FOR VASCULAR ACCESS Right 06/08/2021   Procedure: ULTRASOUND GUIDANCE FOR VASCULAR ACCESS,  RIGHT FEMORAL VEIN;  Surgeon: Maeola Harman, MD;  Location: [facility] OR;  Service: Vascular;  Laterality: Right;    Social History:  Social History   Socioeconomic History   Marital status: Widowed    Spouse name: Not on file   Number of children: Not on file   Years of education: Not on file   Highest education level: Not on file  Occupational History   Not on file  Tobacco Use   Smoking status: Never   Smokeless tobacco: Never  Vaping Use   Vaping Use: Never used  Substance and Sexual Activity   Alcohol use: Never   Drug use: Never   Sexual activity: Not Currently  Other Topics Concern   Not on file  Social History Narrative   Not on file   Social Determinants of Health   Financial Resource Strain: Not on file  Food Insecurity: Not on file  Transportation Needs: Not on file  Physical Activity: Not on file  Stress: Not on file  Social Connections: Not on file  Intimate Partner Violence: Not on file    Family History:  Family History  Problem Relation Age of Onset   Lung disease Mother     Medications:   Current Outpatient Medications on File Prior to Visit  Medication Sig Dispense Refill   acetaminophen (TYLENOL) 325 MG tablet Take 2 tablets (650 mg total) by mouth every 4 (four) hours as needed for mild pain (or temp > 37.5 C (99.5 F)).     amLODipine (NORVASC) 10 MG tablet Take 1 tablet (10 mg total) by mouth daily. 30 tablet 0   aspirin EC 325 MG EC tablet Take 1 tablet (325 mg total) by mouth daily. 30 tablet 0   atorvastatin (LIPITOR) 80 MG tablet TAKE 1 TABLET(80 MG) BY MOUTH DAILY 90 tablet 3   clopidogrel (PLAVIX) 75 MG tablet TAKE 1 TABLET(75 MG) BY MOUTH DAILY 90 tablet 3   docusate sodium (COLACE) 100 MG capsule Take 1 capsule (100 mg total) by mouth 2 (two) times daily. 10 capsule 0   DULoxetine (CYMBALTA) 30 MG capsule Take 1 capsule (30 mg total) by mouth daily. 30 capsule 3   latanoprost (XALATAN) 0.005 % ophthalmic solution Place 1 drop  into both eyes at bedtime. 2.5 mL 12   Vitamin A 2400 MCG (8000 UT) CAPS Take 2,400 mcg by mouth daily. 30 capsule 0   vitamin B-12 1000 MCG tablet Take 1 tablet (1,000 mcg total) by mouth daily. 30 tablet 0   vitamin C (ASCORBIC ACID) 500 MG tablet Take 1 tablet (500 mg total) by mouth daily. 30 tablet 0   No current facility-administered medications on file prior to visit.    Allergies:  No Known Allergies    OBJECTIVE:  Physical Exam  Vitals:   08/17/21 0916  BP: 122/84  Pulse: 72  SpO2: 97%  Weight: 128 lb 4 oz (58.2 kg)  Height: 5\' 5"  (1.651 m)   Body mass index is 21.34 kg/m. No results found.   Post stroke PHQ 2/9 Depression screen PHQ 2/9 08/17/2021  Decreased Interest 0  Down, Depressed, Hopeless 0  PHQ -  2 Score 0  Altered sleeping -  Tired, decreased energy -  Feeling bad or failure about yourself  -  Trouble concentrating -  Moving slowly or fidgety/restless -  Suicidal thoughts -  PHQ-9 Score -     General: Frail petite very pleasant elderly Asian female, seated, in no evident distress Head: head normocephalic and atraumatic.   Neck: supple with no carotid or supraclavicular bruits Cardiovascular: regular rate and rhythm, no murmurs Musculoskeletal: no deformity Skin:  no rash/petichiae Vascular:  Normal pulses all extremities   Neurologic Exam Mental Status: Awake and fully alert.  No evidence of aphasia although language barrier.  Follows commands without difficulty.  Oriented to place and time. Recent memory impaired and remote memory intact. Attention span, concentration and fund of knowledge impaired with daughter providing majority of history. Mood and affect appropriate.  Cranial Nerves: Fundoscopic exam reveals sharp disc margins. Pupils equal, briskly reactive to light. Extraocular movements full without nystagmus. Visual fields full to confrontation. Hearing intact. Facial sensation intact. Face, tongue, palate moves normally and  symmetrically.  Motor:  RUE: 3+-4/5 with decreased hand dexterity RLE: 4+/5 hip flexor otherwise 5/5 LUE: 4+/5 LLE: 4+/5 Sensory.: intact to touch , pinprick , position and vibratory sensation.  Coordination: Rapid alternating movements normal in all extremities except decreased right hand. Finger-to-nose mild ataxia in proportion to weakness and heel-to-shin performed accurately bilaterally. Gait and Station: Arises from chair without difficulty. Stance is normal. Gait demonstrates short shuffled quick steps with mild unsteadiness and use of rolling walker.  Tandem walk and heel toe not attempted.  Reflexes: 1+ and symmetric. Toes downgoing.     NIHSS  1 (mild RUE pronator drift) Modified Rankin  3      ASSESSMENT: Denise Leon is a 75 y.o. year old female with acute left parietal occipital infarct on 06/03/2021 secondary to left ICA high risk plaque s/p left TCAR 8/25. Vascular risk factors include history of stroke (left frontal infarct s/p tPA and ILR 10/2019), carotid arthrosclerosis, HTN, HLD and advanced age.      PLAN:  Left parietal occipital stroke History of prior stroke Residual deficit: R>L hemiparesis, gait impairment and cognitive impairment. Continue HEP. Advised daughter to let us know if she would like to participate in outpatient therapies Loop recorder has not shown atrial fibrillation thus far Continue aspirin 325 mg daily and clopidogrel 75 mg daily  and atorvastatin for secondary stroke prevention and s/p TCAR.   Discussed secondary stroke prevention measures and importance of close PCP follow up for aggressive stroke risk factor management. I have gone over the pathophysiology of stroke, warning signs and symptoms, risk factors and their management in some detail with instructions to go to the closest emergency room for symptoms of concern. Left ICA plaque:  10/2019 CTA head/neck left ICA mixed plaques but no significant stenosis 05/2021 CTA head/neck no  significant changes S/p TCAR 06/08/2021 by Dr. Donzetta Matters 06/22/2021 carotid duplex patent R ICA stent Plans to stop Plavix in January and then continue aspirin alone Plans to repeat carotid duplex around 03/2022 HTN: BP goal <130/90.  Stable on current regimen per PCP - did change Coreg IR to CR - will start at 10mg  dosing as previously taking 12.5mg  total daily dose although majority of the time would forget evening dose - advised daughter to ensure routine monitoring of blood pressure.  Advised to schedule follow-up visit with PCP for ongoing monitoring and management HLD: LDL goal <70. Recent LDL 84 on atorvastatin 80 mg daily.  Needs to schedule f/u visit with PCP    Follow up in 6 months or call earlier if needed   CC:  [facility] provider: Dr. Pearlean Brownie PCP: Kerin Salen, PA-C    I spent 56 minutes of face-to-face and non-face-to-face time with patient and daughter.  This included previsit chart review including review of recent hospitalization, lab review, study review, order entry, electronic health record documentation, patient and daughter education regarding recent stroke including etiology, secondary stroke prevention measures and importance of managing stroke risk factors, residual deficits and typical recovery time and answered all other questions to patient and daughters satisfaction   Ihor Austin, AGNP-BC  [facility] [address] [place], [place] [address]  Phone [phone] Fax [phone] Note: This document was prepared with digital dictation and possible smart phrase technology. Any transcriptional errors that result from this process are unintentional.

## 2021-08-17 NOTE — Patient Instructions (Addendum)
Continue aspirin 325 mg daily and clopidogrel 75 mg daily  and atorvastatin 80mg  daily  for secondary stroke prevention  We will change Coreg 6.25mg  twice daily to extended release Coreg 10mg  daily - please ensure blood pressure is being monitored - and ensure you schedule follow up visit with PCP  Continue to follow up with PCP regarding cholesterol and blood pressure management  Maintain strict control of hypertension with blood pressure goal below 130/90, diabetes with hemoglobin A1c goal below 6.5% and cholesterol with LDL cholesterol (bad cholesterol) goal below 70 mg/dL.   Continue doing exercises at home - if you would like to do additional therapies - please let me know     Followup in the future with me in 6 months or call earlier if needed       Thank you for coming to see at [facility]. I hope we have been able to provide you high quality care today.  You may receive a patient satisfaction survey over the next few weeks. We would appreciate your feedback and comments so that we may continue to improve ourselves and the health of our patients.

## 2021-08-17 NOTE — Telephone Encounter (Signed)
Received fax from [facility] re: carvedilol ER needs PA, called [facility] pharmacy help line, spoke with PA rep who stated she had to transfer me to medicaid rep, p [phone]. Spoke with Elita Quick, began PA, approved 07/18/21- 08/17/2022. Faxed approval to pharmacy.

## 2021-09-24 ENCOUNTER — Emergency Department (HOSPITAL_BASED_OUTPATIENT_CLINIC_OR_DEPARTMENT_OTHER)
Admission: EM | Admit: 2021-09-24 | Discharge: 2021-09-24 | Disposition: A | Discharge status: Home / Self Care | Payer: Medicare Other | Attending: Emergency Medicine | Admitting: Emergency Medicine

## 2021-09-24 ENCOUNTER — Other Ambulatory Visit: Payer: Self-pay

## 2021-09-24 ENCOUNTER — Emergency Department (HOSPITAL_BASED_OUTPATIENT_CLINIC_OR_DEPARTMENT_OTHER): Payer: Medicare Other

## 2021-09-24 ENCOUNTER — Encounter (HOSPITAL_BASED_OUTPATIENT_CLINIC_OR_DEPARTMENT_OTHER): Payer: Self-pay

## 2021-09-24 ENCOUNTER — Emergency Department (HOSPITAL_COMMUNITY): Payer: Medicare Other

## 2021-09-24 DIAGNOSIS — R531 Weakness: Secondary | ICD-10-CM | POA: Diagnosis not present

## 2021-09-24 DIAGNOSIS — Z7982 Long term (current) use of aspirin: Secondary | ICD-10-CM | POA: Diagnosis not present

## 2021-09-24 DIAGNOSIS — R1110 Vomiting, unspecified: Secondary | ICD-10-CM | POA: Insufficient documentation

## 2021-09-24 DIAGNOSIS — Y900 Blood alcohol level of less than 20 mg/100 ml: Secondary | ICD-10-CM | POA: Diagnosis not present

## 2021-09-24 DIAGNOSIS — N1832 Chronic kidney disease, stage 3b: Secondary | ICD-10-CM | POA: Insufficient documentation

## 2021-09-24 DIAGNOSIS — Z7902 Long term (current) use of antithrombotics/antiplatelets: Secondary | ICD-10-CM | POA: Diagnosis not present

## 2021-09-24 DIAGNOSIS — Z79899 Other long term (current) drug therapy: Secondary | ICD-10-CM | POA: Insufficient documentation

## 2021-09-24 DIAGNOSIS — R200 Anesthesia of skin: Secondary | ICD-10-CM | POA: Insufficient documentation

## 2021-09-24 DIAGNOSIS — Z20822 Contact with and (suspected) exposure to covid-19: Secondary | ICD-10-CM | POA: Insufficient documentation

## 2021-09-24 DIAGNOSIS — I639 Cerebral infarction, unspecified: Secondary | ICD-10-CM | POA: Diagnosis not present

## 2021-09-24 DIAGNOSIS — J45909 Unspecified asthma, uncomplicated: Secondary | ICD-10-CM | POA: Diagnosis not present

## 2021-09-24 DIAGNOSIS — I129 Hypertensive chronic kidney disease with stage 1 through stage 4 chronic kidney disease, or unspecified chronic kidney disease: Secondary | ICD-10-CM | POA: Diagnosis not present

## 2021-09-24 LAB — COMPREHENSIVE METABOLIC PANEL
ALT: 15 U/L (ref 0–44)
AST: 23 U/L (ref 15–41)
Albumin: 4.1 g/dL (ref 3.5–5.0)
Alkaline Phosphatase: 62 U/L (ref 38–126)
Anion gap: 11 (ref 5–15)
BUN: 23 mg/dL (ref 8–23)
CO2: 24 mmol/L (ref 22–32)
Calcium: 9.3 mg/dL (ref 8.9–10.3)
Chloride: 103 mmol/L (ref 98–111)
Creatinine, Ser: 0.95 mg/dL (ref 0.44–1.00)
GFR, Estimated: >60 mL/min (ref >60)
Glucose, Bld: 117 mg/dL — ABNORMAL HIGH (ref 70–99)
Potassium: 3.7 mmol/L (ref 3.5–5.1)
Sodium: 138 mmol/L (ref 135–145)
Total Bilirubin: 1.3 mg/dL — ABNORMAL HIGH (ref 0.3–1.2)
Total Protein: 7.6 g/dL (ref 6.5–8.1)

## 2021-09-24 LAB — DIFFERENTIAL
Abs Immature Granulocytes: 0.04 10*3/uL (ref 0.00–0.07)
Basophils Absolute: 0 10*3/uL (ref 0.0–0.1)
Basophils Relative: 0 %
Eosinophils Absolute: 0.1 10*3/uL (ref 0.0–0.5)
Eosinophils Relative: 1 %
Immature Granulocytes: 0 %
Lymphocytes Relative: 19 %
Lymphs Abs: 1.9 10*3/uL (ref 0.7–4.0)
Monocytes Absolute: 0.4 10*3/uL (ref 0.1–1.0)
Monocytes Relative: 4 %
Neutro Abs: 7.4 10*3/uL (ref 1.7–7.7)
Neutrophils Relative %: 76 %

## 2021-09-24 LAB — RESP PANEL BY RT-PCR (FLU A&B, COVID) ARPGX2
Influenza A by PCR: NEGATIVE
Influenza B by PCR: NEGATIVE
SARS Coronavirus 2 by RT PCR: NEGATIVE

## 2021-09-24 LAB — URINALYSIS, ROUTINE W REFLEX MICROSCOPIC
Bilirubin Urine: NEGATIVE
Glucose, UA: NEGATIVE mg/dL
Ketones, ur: 15 mg/dL — AB
Leukocytes,Ua: NEGATIVE
Nitrite: POSITIVE — AB
Protein, ur: 100 mg/dL — AB
Specific Gravity, Urine: 1.01 (ref 1.005–1.030)
pH: 6.5 (ref 5.0–8.0)

## 2021-09-24 LAB — CBC
HCT: 41.1 % (ref 36.0–46.0)
Hemoglobin: 14.4 g/dL (ref 12.0–15.0)
MCH: 30.8 pg (ref 26.0–34.0)
MCHC: 35 g/dL (ref 30.0–36.0)
MCV: 88 fL (ref 80.0–100.0)
Platelets: 365 10*3/uL (ref 150–400)
RBC: 4.67 MIL/uL (ref 3.87–5.11)
RDW: 12.4 % (ref 11.5–15.5)
WBC: 9.8 10*3/uL (ref 4.0–10.5)
nRBC: 0 % (ref 0.0–0.2)

## 2021-09-24 LAB — URINALYSIS, MICROSCOPIC (REFLEX)

## 2021-09-24 LAB — CBG MONITORING, ED: Glucose-Capillary: 136 mg/dL — ABNORMAL HIGH (ref 70–99)

## 2021-09-24 LAB — RAPID URINE DRUG SCREEN, HOSP PERFORMED
Amphetamines: NOT DETECTED
Barbiturates: NOT DETECTED
Benzodiazepines: NOT DETECTED
Cocaine: NOT DETECTED
Opiates: NOT DETECTED
Tetrahydrocannabinol: NOT DETECTED

## 2021-09-24 LAB — ETHANOL: Alcohol, Ethyl (B): <10 mg/dL (ref <10)

## 2021-09-24 LAB — APTT: aPTT: 31 seconds (ref 24–36)

## 2021-09-24 LAB — PROTIME-INR
INR: 1 (ref 0.8–1.2)
Prothrombin Time: 13.2 seconds (ref 11.4–15.2)

## 2021-09-24 MED ORDER — IOHEXOL 350 MG/ML SOLN
80.0000 mL | Freq: Once | INTRAVENOUS | Status: AC | PRN
  Administered 2021-09-24: 80 mL via INTRAVENOUS

## 2021-09-24 NOTE — ED Notes (Signed)
Patient transported to MRI 

## 2021-09-24 NOTE — ED Notes (Signed)
IV inserted into Left AC area via ultrasound tech, pt tolerated well, IV established, flushed well, blood return very strong. Labs obtained after insertion, CMS wnl post IV insertion

## 2021-09-24 NOTE — ED Provider Notes (Signed)
Care of the patient assumed on transfer from [facility]. History of strokes with R sided weakness, seen for possible worsening of same. CT neg, sent for MRI.  Physical Exam  BP (!) 154/101 (BP Location: Left Arm)   Pulse 89   Temp 97.9 F (36.6 C) (Oral)   Resp 16   Ht 5\' 5"  (1.651 m)   Wt 58.1 kg   SpO2 97%   BMI 21.30 kg/m   Physical Exam  ED Course/Procedures   Clinical Course as of 09/24/21 1725  Sun Sep 24, 2021  1708 MRI report not available in Epic but it is in PACS. Impression:  Negative for acute infarct Generalized atrophy with chronic ischemic changes as above.  [CS]  1724 Discussed MRI results with patient and daughter at bedside. Recommend outpatient neurology follow up. RTED for any other concerns.  [CS]    Clinical Course User Index [CS] Sep 26, 2021, MD    Procedures  MDM         Pollyann Savoy, MD 09/24/21 1725

## 2021-09-24 NOTE — ED Triage Notes (Signed)
Pt had weakness in right hand. Stoke in August. Pt NIH 0

## 2021-09-24 NOTE — ED Notes (Signed)
[facility] arrival for transfer Report called to Asher Muir, RN ED charge nurse

## 2021-09-24 NOTE — ED Provider Notes (Signed)
[facility]PART[facility] Provider Note   CSN: [id number] Arrival date & time: 09/24/21  1023     History Chief Complaint  Patient presents with   Emesis    Numbness right hand   Extremity Weakness    Denise Leon is a 75 y.o. female.  75 yo F with a chief complaints of right arm numbness.  This started this morning about 7 AM.  Happened when she woke up.  Patient is extremely hard of hearing and speaks another language has her first language.  Family is in to help interpret but still has some difficulty.  She had 1 episode of emesis after taking her medications this morning.  Level 5 caveat.  The history is provided by the patient and a relative. The history is limited by a language barrier. A language interpreter was used.  Emesis Associated symptoms: no arthralgias, no chills, no fever, no headaches and no myalgias   Extremity Weakness This is a recurrent problem. The current episode started more than 2 days ago. The problem occurs constantly. The problem has not changed since onset.Pertinent negatives include no chest pain, no headaches and no shortness of breath.      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   CVA (cerebral vascular accident) ([place]) 06/10/2021   Stroke (cerebrum) ([place]) 06/05/2021   Stroke-like symptoms    Hypocalcemia 06/04/2021   Hypertensive urgency 06/04/2021   History of CVA (cerebrovascular accident) 06/04/2021   Leukocytosis 06/04/2021   Acute  right-sided weakness 06/03/2021   Palpitations 11/16/2019   Essential hypertension 11/16/2019   Hyperlipidemia 11/16/2019   CKD (chronic kidney disease), stage IIIa 11/16/2019   Thyroid nodule, L 11/16/2019   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   Stroke ([place]) 11/14/2019   Acute ischemic stroke ([facility]) patchy L frontal infarcts s/p tPA 11/14/2019    Past Surgical History:  Procedure Laterality Date   HIP SURGERY     NECK SURGERY     TRANSCAROTID ARTERY REVASCULARIZATION  Left 06/08/2021   Procedure: LEFT TRANSCAROTID ARTERY REVASCULARIZATION;  Surgeon: Waynetta Sandy, MD;  Location: [facility];  Service: Vascular;  Laterality: Left;   ULTRASOUND GUIDANCE FOR VASCULAR ACCESS Right 06/08/2021   Procedure: ULTRASOUND GUIDANCE FOR VASCULAR ACCESS, RIGHT FEMORAL VEIN;  Surgeon: Waynetta Sandy, MD;  Location: [facility];  Service: Vascular;  Laterality: Right;     OB History   No obstetric history on file.     Family History  Problem Relation Age of Onset   Lung disease Mother     Social History   Tobacco Use   Smoking status: Never   Smokeless tobacco: Never  Vaping Use   Vaping Use: Never used  Substance Use Topics   Alcohol use: Never   Drug use: Never    Home Medications Prior to Admission medications   Medication Sig Start Date End Date Taking? Authorizing Provider  acetaminophen (TYLENOL) 325 MG tablet Take  2 tablets (650 mg total) by mouth every 4 (four) hours as needed for mild pain (or temp > 37.5 C (99.5 F)). 06/23/21   Angiulli, Mcarthur Rossetti, PA-C  amLODipine (NORVASC) 10 MG tablet Take 1 tablet (10 mg total) by mouth daily. 06/23/21   Angiulli, Mcarthur Rossetti, PA-C  aspirin EC 325 MG EC tablet Take 1 tablet (325 mg total) by mouth daily. 11/17/19   Layne Benton, NP  atorvastatin (LIPITOR) 80 MG tablet TAKE 1 TABLET(80 MG) BY MOUTH DAILY 07/25/21   Maeola Harman, MD  carvedilol (COREG CR) 10 MG 24 hr capsule Take 1 capsule (10 mg total) by  mouth daily. 08/17/21   Ihor Austin, NP  clopidogrel (PLAVIX) 75 MG tablet TAKE 1 TABLET(75 MG) BY MOUTH DAILY 07/25/21   Maeola Harman, MD  docusate sodium (COLACE) 100 MG capsule Take 1 capsule (100 mg total) by mouth 2 (two) times daily. 06/23/21   Angiulli, Mcarthur Rossetti, PA-C  DULoxetine (CYMBALTA) 30 MG capsule Take 1 capsule (30 mg total) by mouth daily. 06/23/21   Angiulli, Mcarthur Rossetti, PA-C  latanoprost (XALATAN) 0.005 % ophthalmic solution Place 1 drop into both eyes at bedtime. 12/03/19   Angiulli, Mcarthur Rossetti, PA-C  Vitamin A 2400 MCG (8000 UT) CAPS Take 2,400 mcg by mouth daily. 06/23/21   Angiulli, Mcarthur Rossetti, PA-C  vitamin B-12 1000 MCG tablet Take 1 tablet (1,000 mcg total) by mouth daily. 06/23/21   Angiulli, Mcarthur Rossetti, PA-C  vitamin C (ASCORBIC ACID) 500 MG tablet Take 1 tablet (500 mg total) by mouth daily. 06/23/21   Angiulli, Mcarthur Rossetti, PA-C    Allergies    Patient has no known allergies.  Review of Systems   Review of Systems  Constitutional:  Negative for chills and fever.  HENT:  Negative for congestion and rhinorrhea.   Eyes:  Negative for redness and visual disturbance.  Respiratory:  Negative for shortness of breath and wheezing.   Cardiovascular:  Negative for chest pain and palpitations.  Gastrointestinal:  Positive for vomiting. Negative for nausea.  Genitourinary:  Negative for dysuria and urgency.  Musculoskeletal:  Positive for extremity weakness. Negative for arthralgias and myalgias.  Skin:  Negative for pallor and wound.  Neurological:  Negative for dizziness and headaches.   Physical Exam Updated Vital Signs BP (!) 147/120   Pulse 93   Temp 97.9 F (36.6 C) (Oral)   Resp 14   Ht 5\' 5"  (1.651 m)   Wt 58.1 kg   SpO2 94%   BMI 21.30 kg/m   Physical Exam Vitals and nursing note reviewed.  Constitutional:      General: She is not in acute distress.    Appearance: She is well-developed. She is not diaphoretic.  HENT:     Head: Normocephalic and  atraumatic.  Eyes:     Pupils: Pupils are equal, round, and reactive to light.  Cardiovascular:     Rate and Rhythm: Normal rate and regular rhythm.     Heart sounds: No murmur heard.   No friction rub. No gallop.  Pulmonary:     Effort: Pulmonary effort is normal.     Breath sounds: No wheezing or rales.  Abdominal:     General: There is no distension.     Palpations: Abdomen is soft.     Tenderness: There is no abdominal tenderness.  Musculoskeletal:        General: No tenderness.     Cervical back: Normal range of motion and neck  supple.  Skin:    General: Skin is warm and dry.  Neurological:     Mental Status: She is alert and oriented to person, place, and time.     GCS: GCS eye subscore is 4. GCS verbal subscore is 5. GCS motor subscore is 6.     Cranial Nerves: Cranial nerves 2-12 are intact.     Sensory: Sensation is intact.     Coordination: Coordination is intact.     Comments: Weakness to the right upper extremity compared to left.  Right-sided facial droop.  Psychiatric:        Behavior: Behavior normal.    ED Results / Procedures / Treatments   Labs (all labs ordered are listed, but only abnormal results are displayed) Labs Reviewed  COMPREHENSIVE METABOLIC PANEL - Abnormal; Notable for the following components:      Result Value   Glucose, Bld 117 (*)    Total Bilirubin 1.3 (*)    All other components within normal limits  URINALYSIS, ROUTINE W REFLEX MICROSCOPIC - Abnormal; Notable for the following components:   APPearance CLOUDY (*)    Hgb urine dipstick TRACE (*)    Ketones, ur 15 (*)    Protein, ur 100 (*)    Nitrite POSITIVE (*)    All other components within normal limits  URINALYSIS, MICROSCOPIC (REFLEX) - Abnormal; Notable for the following components:   Bacteria, UA MANY (*)    All other components within normal limits  CBG MONITORING, ED - Abnormal; Notable for the following components:   Glucose-Capillary 136 (*)    All other components  within normal limits  RESP PANEL BY RT-PCR (FLU A&B, COVID) ARPGX2  ETHANOL  CBC  DIFFERENTIAL  RAPID URINE DRUG SCREEN, HOSP PERFORMED  PROTIME-INR  APTT    EKG EKG Interpretation  Date/Time:  Sunday September 24 2021 10:42:37 EST Ventricular Rate:  88 PR Interval:  189 QRS Duration: 89 QT Interval:  351 QTC Calculation: 425 R Axis:   4 Text Interpretation: Sinus rhythm Anterior infarct, old Abnormal T, consider ischemia, lateral leads No significant change since last tracing Confirmed by Deno Etienne [phone]) on 09/24/2021 11:07:51 AM  Radiology CT HEAD WO CONTRAST  Result Date: 09/24/2021 CLINICAL DATA:  75 year old female with acute neurologic deficit. Posterior left MCA infarct in August. EXAM: CT HEAD WITHOUT CONTRAST TECHNIQUE: Contiguous axial images were obtained from the base of the skull through the vertex without intravenous contrast. COMPARISON:  Brain MRI 06/04/2021. head CT 06/03/2021. FINDINGS: Brain: Stable cerebral volume. No midline shift, mass effect, or evidence of intracranial mass lesion. No ventriculomegaly. No acute intracranial hemorrhage identified. Confluent bilateral cerebral white matter hypodensity with scattered small areas of cortical encephalomalacia in the posterior left MCA territory corresponding to the August infarct (series 2, image 26). No new cortically based infarct identified. Deep gray matter nuclei, brainstem and cerebellum appears stable. ASPECTS 10. Vascular: Calcified atherosclerosis at the skull base. No suspicious intracranial vascular hyperdensity. Skull: No acute osseous abnormality identified. Sinuses/Orbits: Stable left mastoid effusion. Other Visualized paranasal sinuses and mastoids are stable and well aerated. Other: Rightward gaze similar to August. No acute orbit or scalp soft tissue finding. IMPRESSION: 1. No acute intracranial abnormality. 2. Expected evolution of the posterior left MCA territory infarct since August. Underlying  advanced chronic small vessel disease. 3. Stable left mastoid effusion. Electronically Signed   By: Genevie Ann M.D.   On: 09/24/2021 11:29    Procedures Procedures   Medications Ordered in ED Medications  iohexol (OMNIPAQUE) 350 MG/ML injection 80 mL (80 mLs Intravenous Contrast Given 09/24/21 1212)    ED Course  I have reviewed the triage vital signs and the nursing notes.  Pertinent labs & imaging results that were available during my care of the patient were reviewed by me and considered in my medical decision making (see chart for details).    MDM Rules/Calculators/A&P                           75 yo F with a significant past medical history of stroke comes in with a chief complaint of right arm weakness that she noticed when she woke up this morning.  Last known well last night.  Has a history of right-sided weakness from her stroke.  Most recent neurology note done about a month ago does demonstrate some right upper and right lower extremity weakness which seems consistent with my exam.  They did not note any facial asymmetry.  Out of the window we will obtain a CT scan discussed with neurology.  I discussed the case with Dr. Quinn Axe, recommended a CT angiogram of the head and neck if no obvious cause of the patient's symptoms identified did recommend transfer for MRI.  If negative she felt that she could likely follow-up as an outpatient.  I discussed case with Dr. Billy Fischer, she accepts the patient in ED to ED transfer.  Patient is requesting [facility].  The patients results and plan were reviewed and discussed.   Any x-rays performed were independently reviewed by myself.   Differential diagnosis were considered with the presenting HPI.  Medications  iohexol (OMNIPAQUE) 350 MG/ML injection 80 mL (80 mLs Intravenous Contrast Given 09/24/21 1212)    Vitals:   09/24/21 1200 09/24/21 1230 09/24/21 1300 09/24/21 1330  BP: (!) 151/96 (!) 145/81 (!) 148/94 (!) 147/120  Pulse: 88 89  92 93  Resp: (!) 25 12 16 14   Temp:      TempSrc:      SpO2: 98% 98% 96% 94%  Weight:      Height:        Final diagnoses:  Right arm numbness     Final Clinical Impression(s) / ED Diagnoses Final diagnoses:  Right arm numbness    Rx / DC Orders ED Discharge Orders     None        Deno Etienne, DO 09/24/21 1359

## 2021-09-24 NOTE — ED Triage Notes (Signed)
Pt awoke with numbness right hand, hx htn, vomited am, awake, alert, oriented. Moves all extremities.

## 2021-09-27 ENCOUNTER — Telehealth: Payer: Self-pay | Admitting: Adult Health

## 2021-09-27 NOTE — Telephone Encounter (Signed)
Patients daughter Denise Leon called in yesterday regarding patient's recent ED visit. ED recommended patient f/u with our office. I spoke with patients provider [name] about this--[name] stated I do not see any reason for sooner f/u visit as I just recently saw her. Imaging unremarkable and per ED note, provider exam consistent with exam completed at recent visit. There would not be anything further that we would do. Would recommend continuing to monitor and to call 911 immediately if this should reoccur.    Called patients daughter back to advise of the above, had to leave message on VM. If daughter calls back please relay the same information.

## 2021-10-15 LAB — CUP PACEART REMOTE DEVICE CHECK
Date Time Interrogation Session: 20221230230544
Implantable Pulse Generator Implant Date: 20210405

## 2021-10-17 ENCOUNTER — Ambulatory Visit (INDEPENDENT_AMBULATORY_CARE_PROVIDER_SITE_OTHER): Payer: Medicare Other

## 2021-10-17 DIAGNOSIS — I63412 Cerebral infarction due to embolism of left middle cerebral artery: Secondary | ICD-10-CM

## 2021-10-26 NOTE — Progress Notes (Signed)
Carelink Summary Report / Loop Recorder 

## 2021-11-28 ENCOUNTER — Ambulatory Visit (INDEPENDENT_AMBULATORY_CARE_PROVIDER_SITE_OTHER): Payer: Medicare Other

## 2021-11-28 DIAGNOSIS — I63412 Cerebral infarction due to embolism of left middle cerebral artery: Secondary | ICD-10-CM | POA: Diagnosis not present

## 2021-11-28 LAB — CUP PACEART REMOTE DEVICE CHECK
Date Time Interrogation Session: 20230213231029
Implantable Pulse Generator Implant Date: 20210405

## 2021-12-04 NOTE — Progress Notes (Signed)
Carelink Summary Report / Loop Recorder 

## 2021-12-05 ENCOUNTER — Telehealth: Payer: Self-pay

## 2021-12-05 NOTE — Telephone Encounter (Signed)
Patient reports of tenderness/redness to site x1 month. Denies fever, chills, drainage or swelling.   Device clinic apt made 12/05/21 @ 9:30. Renee on site. Location, date and time discussed with verbal understanding.

## 2021-12-05 NOTE — Telephone Encounter (Signed)
Attempted to contact patient. Family member who answered phone stated she was not with patient at the moment but will go back to her house shortly and return phone call.

## 2021-12-05 NOTE — Telephone Encounter (Signed)
Patient daughter Flor(DPR) called in stating the patient wound site is now swollen and painful. Patient daughter states her mom says its been about a month. Patient daughter is going to send a picture and I have let her know that a nurse will call her back once we have received it

## 2021-12-05 NOTE — Telephone Encounter (Signed)
Patient daughter called back, I let her know a nurse will call her back, patient verbalized understanding

## 2021-12-06 ENCOUNTER — Other Ambulatory Visit: Payer: Self-pay

## 2021-12-06 ENCOUNTER — Ambulatory Visit: Payer: Medicare Other

## 2021-12-06 DIAGNOSIS — I63412 Cerebral infarction due to embolism of left middle cerebral artery: Secondary | ICD-10-CM

## 2021-12-06 MED ORDER — CEPHALEXIN 500 MG PO CAPS: 500.0000 mg | ORAL_CAPSULE | Freq: Two times a day (BID) | ORAL | 0 refills | Status: DC

## 2021-12-06 NOTE — Progress Notes (Signed)
LINQ Incision site pink/painful to touch patient's daughter stated that the site has not always looked like this, assessed by Tommye Standard patient to start Keflex 500mg  BID x 7 days and to f/u with [facility] next week, appointment made for [facility] 11/16/21 at 9:40AM office as that is the daughter can only bring patient in the mornings to appointments.

## 2021-12-12 ENCOUNTER — Ambulatory Visit (INDEPENDENT_AMBULATORY_CARE_PROVIDER_SITE_OTHER): Payer: Medicare Other | Admitting: Internal Medicine

## 2021-12-12 ENCOUNTER — Encounter: Payer: Self-pay | Admitting: Internal Medicine

## 2021-12-12 ENCOUNTER — Other Ambulatory Visit: Payer: Self-pay

## 2021-12-12 VITALS — BP 150/86 | HR 68 | Ht 65.0 in

## 2021-12-12 DIAGNOSIS — I639 Cerebral infarction, unspecified: Secondary | ICD-10-CM | POA: Diagnosis not present

## 2021-12-12 DIAGNOSIS — Z959 Presence of cardiac and vascular implant and graft, unspecified: Secondary | ICD-10-CM

## 2021-12-12 MED ORDER — ASPIRIN EC 81 MG PO TBEC: 81.0000 mg | DELAYED_RELEASE_TABLET | Freq: Every day | ORAL | Status: AC

## 2021-12-12 MED ORDER — CEPHALEXIN 500 MG PO CAPS: ORAL_CAPSULE | ORAL | 0 refills | Status: DC

## 2021-12-12 MED ORDER — CEPHALEXIN 250 MG/5ML PO SUSR
500.0000 mg | Freq: Three times a day (TID) | ORAL | 0 refills | Status: DC
Start: 2021-12-12 — End: 2023-09-20

## 2021-12-12 NOTE — Progress Notes (Signed)
Patient Care Team: Marinda Elk as PCP - General (Internal Medicine)   HPI  Denise Leon is a 76 y.o. 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  She was monitored following discharge with the event recorder because of high-grade baseline ectopy failing to demonstrate atrial fibrillation.   2/21 echocardiogram EF 55-60%  Seen today because of concerns that her loop recorder is infected.  Over 3 weeks or so there has been progressive erythema over a keloid incision on her left parasternal chest.  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   Palpitations 11/16/2019   Stroke ([place]) 11/14/2019   Thyroid nodule, L 11/16/2019    Past Surgical History:  Procedure Laterality Date   HIP SURGERY     NECK SURGERY     TRANSCAROTID ARTERY REVASCULARIZATION  Left 06/08/2021   Procedure: LEFT TRANSCAROTID ARTERY REVASCULARIZATION;  Surgeon: Waynetta Sandy, MD;  Location: [facility];  Service: Vascular;  Laterality: Left;   ULTRASOUND GUIDANCE FOR VASCULAR ACCESS Right 06/08/2021   Procedure: ULTRASOUND GUIDANCE FOR VASCULAR ACCESS, RIGHT FEMORAL VEIN;  Surgeon: Waynetta Sandy, MD;  Location: [facility];  Service: Vascular;  Laterality: Right;    No outpatient medications have been marked as taking for the 12/12/21 encounter (Office Visit) with Deboraha Sprang, MD.    No Known Allergies    Review of  Systems negative except from HPI and PMH  Physical Exam There were no vitals taken for this visit. Well developed and well nourished in no acute distress HENT normal Neck supple  t Clear Device pocket well healed; without hematoma or erythema.  There is no tethering  Keloid inflamed tender with Perikeloid erythema and some slight fluctuance Regular rate and rhythm, no murmur Abd-soft with active BS No Clubbing cyanosis   edema Skin-warm and dry A & Oriented  Grossly normal sensory and motor function     CrCl cannot be calculated (Patient's most recent lab result is older than the maximum 21 days allowed.).   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  The device is actually about 1.5-2 cm lateral to the inflamed keloid.    It has not improved with his first exposure to antibiotics.  Denise Leon [id number]  [id number]  Preop Dx:? Keloid abscess Postop Dx same/erythema  Procedure: I&D The keloid was infiltrated with lidocaine.  3 small puncture incisions were made longitudinally across its length nothing was expressed.   Cx: None   We will resume  antibiotics.  And evaluate again next week -- cephalexin 500 mg every 8     Virl Axe, MD 12/12/2021 11:02 AM-      Current medicines are reviewed at length with the patient today .  The patient does not  have concerns regarding medicines.

## 2021-12-12 NOTE — Patient Instructions (Addendum)
Medication Instructions:  - Your physician has recommended you make the following change in your medication:   1) START Keflex 500 mg: - take 10 mL by mouth every 8 hours x 7 days  2) DECRASE Aspirin to 81 mg: - take 1 tablet by mouth once daily   3) if you continue to have swallowing issues, please contact your primary care doctor for further evaluation  *If you need a refill on your cardiac medications before your next appointment, please call your pharmacy*   Lab Work: - none ordered  If you have labs (blood work) drawn today and your tests are completely normal, you will receive your results only by: MyChart Message (if you have MyChart) OR A paper copy in the mail If you have any lab test that is abnormal or we need to change your treatment, we will call you to review the results.   Testing/Procedures: - none ordered   Follow-Up: At [facility], you and your health needs are our priority.  As part of our continuing mission to provide you with exceptional heart care, we have created designated Provider Care Teams.  These Care Teams include your primary Cardiologist (physician) and Advanced Practice Providers (APPs -  Physician Assistants and Nurse Practitioners) who all work together to provide you with the care you need, when you need it.  We recommend signing up for the patient portal called "MyChart".  Sign up information is provided on this After Visit Summary.  MyChart is used to connect with patients for Virtual Visits (Telemedicine).  Patients are able to view lab/test results, encounter notes, upcoming appointments, etc.  Non-urgent messages can be sent to your provider as well.   To learn more about what you can do with MyChart, go to [email or web address].    Your next appointment:   Call the Device Clinic on Monday 12/18/21 so they may follow up with how your wound looks  [phone]   The format for your next appointment:   As above  Provider:   As  above     Other Instructions

## 2021-12-18 ENCOUNTER — Telehealth: Payer: Self-pay

## 2021-12-18 NOTE — Telephone Encounter (Signed)
Successful telephone encounter to daughter of patient (on [place]) to advise that Dr. Graciela Husbands had reviewed photos and no other recommendations at this time. Continue to take antibiotics until dosage completed. Follow up as needed. Advised daughter that patient may want to follow up with PCP or dermatology if area worsens again. Daughter appreciative of follow up.  ?

## 2021-12-18 NOTE — Telephone Encounter (Signed)
Patient's daughter (on [place]) calling as directed for telephone assessment of irritated keloid near loop implantation site. Requested daughter send pics. Reviewed pics with Dr. Caryl Comes who advises this is not a loop implant site infection as this is medial to loop implant. No new orders received. Continue antibiotics as prescribed. ? ?At 2:39 pm Unsuccessful telephone encounter to daughter to follow up. [employer] compliant VM message left requesting call back to [phone]. ? ? ? ?

## 2021-12-18 NOTE — Telephone Encounter (Signed)
The patient daughter Charlie Pitter called to follow up about the patient wound site. I let her speak with Portia, rn.

## 2021-12-21 ENCOUNTER — Encounter (HOSPITAL_BASED_OUTPATIENT_CLINIC_OR_DEPARTMENT_OTHER): Payer: Self-pay

## 2021-12-21 ENCOUNTER — Emergency Department (HOSPITAL_BASED_OUTPATIENT_CLINIC_OR_DEPARTMENT_OTHER): Payer: Medicare Other

## 2021-12-21 ENCOUNTER — Emergency Department (HOSPITAL_BASED_OUTPATIENT_CLINIC_OR_DEPARTMENT_OTHER)
Admission: EM | Admit: 2021-12-21 | Discharge: 2021-12-21 | Disposition: A | Discharge status: Home / Self Care | Payer: Medicare Other | Attending: Emergency Medicine | Admitting: Emergency Medicine

## 2021-12-21 ENCOUNTER — Other Ambulatory Visit: Payer: Self-pay

## 2021-12-21 DIAGNOSIS — N189 Chronic kidney disease, unspecified: Secondary | ICD-10-CM | POA: Diagnosis not present

## 2021-12-21 DIAGNOSIS — Z79899 Other long term (current) drug therapy: Secondary | ICD-10-CM | POA: Insufficient documentation

## 2021-12-21 DIAGNOSIS — I129 Hypertensive chronic kidney disease with stage 1 through stage 4 chronic kidney disease, or unspecified chronic kidney disease: Secondary | ICD-10-CM | POA: Insufficient documentation

## 2021-12-21 DIAGNOSIS — R1310 Dysphagia, unspecified: Secondary | ICD-10-CM | POA: Diagnosis not present

## 2021-12-21 DIAGNOSIS — Z7982 Long term (current) use of aspirin: Secondary | ICD-10-CM | POA: Diagnosis not present

## 2021-12-21 DIAGNOSIS — R638 Other symptoms and signs concerning food and fluid intake: Secondary | ICD-10-CM | POA: Diagnosis present

## 2021-12-21 DIAGNOSIS — R0602 Shortness of breath: Secondary | ICD-10-CM | POA: Diagnosis not present

## 2021-12-21 MED ORDER — LACTATED RINGERS IV BOLUS: 1000.0000 mL | Freq: Once | INTRAVENOUS | Status: DC

## 2021-12-21 NOTE — Progress Notes (Signed)
Patient is refusing for RT to establish IV access. ?

## 2021-12-21 NOTE — ED Provider Notes (Signed)
[facility] Provider Note   CSN: [id number] Arrival date & time: 12/21/21  1922     History  Chief Complaint  Patient presents with   Shortness of Breath    Denise Leon is a 76 y.o. female.  Pt is a 76y/o female with hx of HTN, stroke x2, CKD who presents today for complaints of decreased oral intake.  Patient is a very poor historian and does not give much history.  Spoke with her daughter on the phone who gave all the history.  Daughter reports for the past 10 days patient has been complaining of difficulty swallowing.  She describes to her daughter that it feels like food is not going down her throat and gets stuck making her feel short of breath and have pain in her chest.  This is always associated with eating so she has stopped eating per her daughter.  She will drink water and will sometimes drink Ensure but refuses to eat even pured food reporting that it feels like it is getting stuck.  She has an appointment with her PCP next week but in the last 2 days she had refused to eat or drink almost anything.  Her daughter got worried and took her to urgent care today.  Based on patient's prior history of strokes, age and symptoms they sent her here for further evaluation.  Patient denies any chest pain currently, shortness of breath, fever, cough.  She denies any abdominal pain, vomiting or diarrhea.  Her daughter reports that for the past 2 days she will not even take any of her medications.  However she does not seem to choke or have any difficulty swallowing water.  No prior history of similar.  The history is provided by the patient and medical records.  Shortness of Breath     Home Medications Prior to Admission medications   Medication Sig Start Date End Date Taking? Authorizing Provider  acetaminophen (TYLENOL) 325 MG tablet Take 2 tablets (650 mg total) by mouth every 4 (four) hours as needed for mild pain (or temp > 37.5 C (99.5 F)). 06/23/21    Angiulli, Lavon Paganini, PA-C  amLODipine (NORVASC) 10 MG tablet Take 1 tablet (10 mg total) by mouth daily. 06/23/21   Angiulli, Lavon Paganini, PA-C  aspirin EC 81 MG tablet Take 1 tablet (81 mg total) by mouth daily. Swallow whole. 12/12/21   Deboraha Sprang, MD  atorvastatin (LIPITOR) 80 MG tablet TAKE 1 TABLET(80 MG) BY MOUTH DAILY 07/25/21   Waynetta Sandy, MD  carvedilol (COREG CR) 10 MG 24 hr capsule Take 1 capsule (10 mg total) by mouth daily. 08/17/21   Frann Rider, NP  cephALEXin (KEFLEX) 250 MG/5ML suspension Take 10 mLs (500 mg total) by mouth 3 (three) times daily. For 7 days 12/12/21   Deboraha Sprang, MD  clopidogrel (PLAVIX) 75 MG tablet TAKE 1 TABLET(75 MG) BY MOUTH DAILY 07/25/21   Waynetta Sandy, MD  docusate sodium (COLACE) 100 MG capsule Take 1 capsule (100 mg total) by mouth 2 (two) times daily. Patient taking differently: Take 100 mg by mouth daily as needed. 06/23/21   Angiulli, Lavon Paganini, PA-C  DULoxetine (CYMBALTA) 30 MG capsule Take 1 capsule (30 mg total) by mouth daily. 06/23/21   Angiulli, Lavon Paganini, PA-C  latanoprost (XALATAN) 0.005 % ophthalmic solution Place 1 drop into both eyes at bedtime. 12/03/19   Angiulli, Lavon Paganini, PA-C  Vitamin A 2400 MCG (8000 UT) CAPS Take 2,400 mcg  by mouth daily. 06/23/21   Angiulli, Lavon Paganini, PA-C  vitamin B-12 1000 MCG tablet Take 1 tablet (1,000 mcg total) by mouth daily. 06/23/21   Angiulli, Lavon Paganini, PA-C  vitamin C (ASCORBIC ACID) 500 MG tablet Take 1 tablet (500 mg total) by mouth daily. 06/23/21   Angiulli, Lavon Paganini, PA-C      Allergies    Patient has no known allergies.    Review of Systems   Review of Systems  Respiratory:  Positive for shortness of breath.    Physical Exam Updated Vital Signs BP (!) 148/86 (BP Location: Right Arm)    Pulse 95    Temp 98.3 F (36.8 C) (Oral)    Resp 18    SpO2 96%  Physical Exam Vitals and nursing note reviewed.  Constitutional:      General: She is not in acute distress.     Appearance: She is well-developed.  HENT:     Head: Normocephalic and atraumatic.     Right Ear: Tympanic membrane normal.     Left Ear: Tympanic membrane normal.     Nose: Nose normal.     Mouth/Throat:     Mouth: Mucous membranes are dry.  Eyes:     Pupils: Pupils are equal, round, and reactive to light.  Cardiovascular:     Rate and Rhythm: Normal rate and regular rhythm.     Heart sounds: Normal heart sounds. No murmur heard.   No friction rub.  Pulmonary:     Effort: Pulmonary effort is normal.     Breath sounds: Normal breath sounds. No wheezing or rales.  Abdominal:     General: Bowel sounds are normal. There is no distension.     Palpations: Abdomen is soft.     Tenderness: There is no abdominal tenderness. There is no guarding or rebound.  Musculoskeletal:        General: No tenderness. Normal range of motion.     Right lower leg: No edema.     Left lower leg: No edema.     Comments: No edema  Skin:    General: Skin is warm and dry.     Findings: No rash.  Neurological:     Mental Status: She is alert and oriented to person, place, and time.     Cranial Nerves: No cranial nerve deficit.  Psychiatric:        Mood and Affect: Mood normal.        Behavior: Behavior normal.    ED Results / Procedures / Treatments   Labs (all labs ordered are listed, but only abnormal results are displayed) Labs Reviewed  CBC WITH DIFFERENTIAL/PLATELET  COMPREHENSIVE METABOLIC PANEL  URINALYSIS, ROUTINE W REFLEX MICROSCOPIC  TROPONIN I (HIGH SENSITIVITY)  TROPONIN I (HIGH SENSITIVITY)    EKG EKG Interpretation  Date/Time:  Thursday December 21 2021 21:03:37 EST Ventricular Rate:  95 PR Interval:  162 QRS Duration: 96 QT Interval:  350 QTC Calculation: 440 R Axis:   1 Text Interpretation: Sinus rhythm Borderline T abnormalities, lateral leads No significant change since last tracing Confirmed by Blanchie Dessert [id number]) on 12/21/2021 9:59:02 PM  Radiology CT Head Wo  Contrast  Result Date: 12/21/2021 CLINICAL DATA:  Neuro deficit. EXAM: CT HEAD WITHOUT CONTRAST TECHNIQUE: Contiguous axial images were obtained from the base of the skull through the vertex without intravenous contrast. RADIATION DOSE REDUCTION: This exam was performed according to the departmental dose-optimization program which includes automated exposure control, adjustment of the mA and/or  kV according to patient size and/or use of iterative reconstruction technique. COMPARISON:  September 24, 2021. FINDINGS: Brain: Mild diffuse cortical atrophy is noted. Mild chronic ischemic white matter disease is noted. No mass effect or midline shift is noted. Ventricular size is within normal limits. There is no evidence of mass lesion, hemorrhage or acute infarction. Vascular: No hyperdense vessel or unexpected calcification. Skull: Normal. Negative for fracture or focal lesion. Sinuses/Orbits: No acute finding. Other: None. IMPRESSION: No acute intracranial abnormality seen. Electronically Signed   By: Marijo Conception M.D.   On: 12/21/2021 21:34   DG Chest Port 1 View  Result Date: 12/21/2021 CLINICAL DATA:  Chest pain and shortness of breath. EXAM: PORTABLE CHEST 1 VIEW COMPARISON:  Radiograph 3 hours prior FINDINGS: The heart is normal in size. Stable mediastinal contours with aortic atherosclerosis and tortuosity. Implanted loop recorder projects over the left chest wall. The lungs are clear. Pulmonary vasculature is normal. No consolidation, pleural effusion, or pneumothorax. No acute osseous abnormalities are seen. 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 IMPRESSION: No acute chest findings. Electronically Signed   By: Keith Rake M.D.   On: 12/21/2021 20:51    Procedures Procedures    Medications Ordered in ED Medications  lactated ringers bolus 1,000 mL (1,000 mLs Intravenous Patient Refused/Not Given 12/21/21 2215)    ED Course/ Medical Decision Making/ A&P                            Medical Decision Making Amount and/or Complexity of Data Reviewed Labs: ordered. Radiology: ordered and independent interpretation performed. Decision-making details documented in ED Course.   Elderly female presenting today with poor oral intake and difficulty swallowing per her daughter. Patient has multiple medical problems but does not seem to have a functional problem with swallowing and she is not coughing or choking when she swallows.  The way she describes to her daughter concern for possible esophageal stricture.  Patient was able to drink a whole Ensure without any difficulty here.  Her vital signs are within normal limits.  Given the above complaints will ensure normal electrolytes, no new strokes, EKG and chest x-ray.  Patient is in no acute distress at this time.  Low suspicion for ACS, pneumonia, acute abdominal process.  Patient's external medical records from urgent care were reviewed.  Most of the history was obtained by her daughter was a better historian.  11:34 PM I independently visualized and interpreted patient's chest x-ray and head CT which showed no acute process or intracranial hemorrhage.  Radiology reported that both studies were negative.  Labs have been ordered however patient refused EKG and labs and reports she is ready to go home.  She drank an entire Ensure while she was sitting in the room without difficulty.  She does not want any further work-up at this time and wants her daughter to come pick her up.  Patient appears to be in no acute distress at this time feels reasonable to let her go home.  She does have follow-up with her PCP next week where she can have further evaluation at the that time but recommended she continue pured or liquids        Final Clinical Impression(s) / ED Diagnoses Final diagnoses:  Dysphagia, unspecified type    Rx / DC Orders ED Discharge Orders     None         Blanchie Dessert, MD 12/21/21 2335

## 2021-12-21 NOTE — Discharge Instructions (Addendum)
The brain scan today and the x-ray of the chest looked normal.  The EKG was normal.  However you did not want any blood work drawn and it will be important to see your doctor next week as planned.  In the meantime if it feels like it is difficult for you to swallow whole food you can drink boost or Ensure and have pur?ed food or milkshakes.  You may need to see a gastroenterologist in the future to take a camera down and look at your throat to make sure there is no narrowing. ?

## 2021-12-21 NOTE — ED Triage Notes (Signed)
Per daughter pt c/o SOB, difficulty swallowing, decreased po intake x 10 days-pt was seen at [facility] where she had CXR and strep-both were neg-states she was advised to bring pt to the ED due to she was not able to give a urine sample-pt NAD- to triage in w/c-extremely [facility] ?

## 2021-12-21 NOTE — ED Notes (Signed)
PT repeatedly calling out to leave and repeatedly told process by this tech. PT does not seem to accept any information given by this tech ?

## 2021-12-21 NOTE — ED Notes (Signed)
Provider aware of need for ultrasound guided IV

## 2021-12-21 NOTE — ED Notes (Signed)
Patient refused blood work and IV. Provider aware. ?

## 2022-01-01 ENCOUNTER — Ambulatory Visit (INDEPENDENT_AMBULATORY_CARE_PROVIDER_SITE_OTHER): Payer: Medicare Other

## 2022-01-01 DIAGNOSIS — I639 Cerebral infarction, unspecified: Secondary | ICD-10-CM

## 2022-01-01 LAB — CUP PACEART REMOTE DEVICE CHECK
Date Time Interrogation Session: 20230319230951
Implantable Pulse Generator Implant Date: 20210405

## 2022-01-10 NOTE — Progress Notes (Signed)
Carelink Summary Report / Loop Recorder 

## 2022-01-18 ENCOUNTER — Emergency Department (HOSPITAL_BASED_OUTPATIENT_CLINIC_OR_DEPARTMENT_OTHER): Payer: Medicare Other

## 2022-01-18 ENCOUNTER — Emergency Department (HOSPITAL_BASED_OUTPATIENT_CLINIC_OR_DEPARTMENT_OTHER)
Admission: EM | Admit: 2022-01-18 | Discharge: 2022-01-18 | Disposition: A | Discharge status: Home / Self Care | Payer: Medicare Other | Attending: Emergency Medicine | Admitting: Emergency Medicine

## 2022-01-18 ENCOUNTER — Other Ambulatory Visit: Payer: Self-pay

## 2022-01-18 DIAGNOSIS — S0990XA Unspecified injury of head, initial encounter: Secondary | ICD-10-CM | POA: Diagnosis present

## 2022-01-18 DIAGNOSIS — Z7982 Long term (current) use of aspirin: Secondary | ICD-10-CM | POA: Insufficient documentation

## 2022-01-18 DIAGNOSIS — W19XXXA Unspecified fall, initial encounter: Secondary | ICD-10-CM

## 2022-01-18 DIAGNOSIS — W01198A Fall on same level from slipping, tripping and stumbling with subsequent striking against other object, initial encounter: Secondary | ICD-10-CM | POA: Insufficient documentation

## 2022-01-18 DIAGNOSIS — S0093XA Contusion of unspecified part of head, initial encounter: Secondary | ICD-10-CM | POA: Diagnosis not present

## 2022-01-18 DIAGNOSIS — Z7901 Long term (current) use of anticoagulants: Secondary | ICD-10-CM | POA: Diagnosis not present

## 2022-01-18 LAB — CBC WITH DIFFERENTIAL/PLATELET
Abs Immature Granulocytes: 0.03 10*3/uL (ref 0.00–0.07)
Basophils Absolute: 0 10*3/uL (ref 0.0–0.1)
Basophils Relative: 0 %
Eosinophils Absolute: 0.2 10*3/uL (ref 0.0–0.5)
Eosinophils Relative: 2 %
HCT: 39.8 % (ref 36.0–46.0)
Hemoglobin: 13.6 g/dL (ref 12.0–15.0)
Immature Granulocytes: 0 %
Lymphocytes Relative: 20 %
Lymphs Abs: 2.4 10*3/uL (ref 0.7–4.0)
MCH: 30.9 pg (ref 26.0–34.0)
MCHC: 34.2 g/dL (ref 30.0–36.0)
MCV: 90.5 fL (ref 80.0–100.0)
Monocytes Absolute: 1 10*3/uL (ref 0.1–1.0)
Monocytes Relative: 8 %
Neutro Abs: 8.5 10*3/uL — ABNORMAL HIGH (ref 1.7–7.7)
Neutrophils Relative %: 70 %
Platelets: 291 10*3/uL (ref 150–400)
RBC: 4.4 MIL/uL (ref 3.87–5.11)
RDW: 12.9 % (ref 11.5–15.5)
WBC: 12.2 10*3/uL — ABNORMAL HIGH (ref 4.0–10.5)
nRBC: 0 % (ref 0.0–0.2)

## 2022-01-18 LAB — COMPREHENSIVE METABOLIC PANEL
ALT: 13 U/L (ref 0–44)
AST: 25 U/L (ref 15–41)
Albumin: 3.5 g/dL (ref 3.5–5.0)
Alkaline Phosphatase: 40 U/L (ref 38–126)
Anion gap: 10 (ref 5–15)
BUN: 27 mg/dL — ABNORMAL HIGH (ref 8–23)
CO2: 23 mmol/L (ref 22–32)
Calcium: 8.9 mg/dL (ref 8.9–10.3)
Chloride: 107 mmol/L (ref 98–111)
Creatinine, Ser: 0.99 mg/dL (ref 0.44–1.00)
GFR, Estimated: 60 mL/min — ABNORMAL LOW (ref >60)
Glucose, Bld: 90 mg/dL (ref 70–99)
Potassium: 3.8 mmol/L (ref 3.5–5.1)
Sodium: 140 mmol/L (ref 135–145)
Total Bilirubin: 1.6 mg/dL — ABNORMAL HIGH (ref 0.3–1.2)
Total Protein: 6.9 g/dL (ref 6.5–8.1)

## 2022-01-18 NOTE — ED Provider Notes (Signed)
?[facility] ?Provider Note ? ? ?CSN: [id number] ?Arrival date & time: 01/18/22  1800 ? ?  ? ?History ? ?Chief Complaint  ?Patient presents with  ? Fall  ? ? ?Denise Leon is a 76 y.o. female. ? ?Patient per family with mechanical fall about 5 hours ago.  Patient was not using her walker to walk when she fell and hit her head.  Has not been on her Plavix for the past week as she is holding it for planned colonoscopy.  Patient denies any extremity pain.  Denies any headache, nausea, vomiting.  Denies any neck pain.  Has hematoma to the side of her head. ? ?The history is provided by the patient.  ?Fall ?This is a new problem. The current episode started 6 to 12 hours ago. The problem has been resolved. Associated symptoms include headaches. Pertinent negatives include no chest pain, no abdominal pain and no shortness of breath. Nothing aggravates the symptoms. Nothing relieves the symptoms. She has tried nothing for the symptoms. The treatment provided no relief.  ? ?  ? ?Home Medications ?Prior to Admission medications   ?Medication Sig Start Date End Date Taking? Authorizing Provider  ?acetaminophen (TYLENOL) 325 MG tablet Take 2 tablets (650 mg total) by mouth every 4 (four) hours as needed for mild pain (or temp > 37.5 C (99.5 F)). 06/23/21   Angiulli, Mcarthur Rossetti, PA-C  ?amLODipine (NORVASC) 10 MG tablet Take 1 tablet (10 mg total) by mouth daily. 06/23/21   Angiulli, Mcarthur Rossetti, PA-C  ?aspirin EC 81 MG tablet Take 1 tablet (81 mg total) by mouth daily. Swallow whole. 12/12/21   Duke Salvia, MD  ?atorvastatin (LIPITOR) 80 MG tablet TAKE 1 TABLET(80 MG) BY MOUTH DAILY 07/25/21   Maeola Harman, MD  ?carvedilol (COREG CR) 10 MG 24 hr capsule Take 1 capsule (10 mg total) by mouth daily. 08/17/21   Ihor Austin, NP  ?cephALEXin (KEFLEX) 250 MG/5ML suspension Take 10 mLs (500 mg total) by mouth 3 (three) times daily. For 7 days 12/12/21   Duke Salvia, MD  ?clopidogrel (PLAVIX) 75  MG tablet TAKE 1 TABLET(75 MG) BY MOUTH DAILY 07/25/21   Maeola Harman, MD  ?docusate sodium (COLACE) 100 MG capsule Take 1 capsule (100 mg total) by mouth 2 (two) times daily. ?Patient taking differently: Take 100 mg by mouth daily as needed. 06/23/21   Angiulli, Mcarthur Rossetti, PA-C  ?DULoxetine (CYMBALTA) 30 MG capsule Take 1 capsule (30 mg total) by mouth daily. 06/23/21   Angiulli, Mcarthur Rossetti, PA-C  ?latanoprost (XALATAN) 0.005 % ophthalmic solution Place 1 drop into both eyes at bedtime. 12/03/19   Angiulli, Mcarthur Rossetti, PA-C  ?Vitamin A 2400 MCG (8000 UT) CAPS Take 2,400 mcg by mouth daily. 06/23/21   Angiulli, Mcarthur Rossetti, PA-C  ?vitamin B-12 1000 MCG tablet Take 1 tablet (1,000 mcg total) by mouth daily. 06/23/21   Angiulli, Mcarthur Rossetti, PA-C  ?vitamin C (ASCORBIC ACID) 500 MG tablet Take 1 tablet (500 mg total) by mouth daily. 06/23/21   Angiulli, Mcarthur Rossetti, PA-C  ?   ? ?Allergies    ?Patient has no known allergies.   ? ?Review of Systems   ?Review of Systems  ?Respiratory:  Negative for shortness of breath.   ?Cardiovascular:  Negative for chest pain.  ?Gastrointestinal:  Negative for abdominal pain.  ?Neurological:  Positive for headaches.  ? ?Physical Exam ?Updated Vital Signs ?BP (!) 147/78   Pulse 72   Temp 97.9 ?  F (36.6 ?C) (Oral)   Resp 16   SpO2 99%  ?Physical Exam ?Vitals and nursing note reviewed.  ?Constitutional:   ?   General: She is not in acute distress. ?   Appearance: She is well-developed.  ?HENT:  ?   Head:  ?   Comments: Hematoma to the right side of the head ?   Nose: Nose normal.  ?   Mouth/Throat:  ?   Mouth: Mucous membranes are moist.  ?Eyes:  ?   Extraocular Movements: Extraocular movements intact.  ?   Conjunctiva/sclera: Conjunctivae normal.  ?   Pupils: Pupils are equal, round, and reactive to light.  ?Cardiovascular:  ?   Rate and Rhythm: Normal rate and regular rhythm.  ?   Heart sounds: No murmur heard. ?Pulmonary:  ?   Effort: Pulmonary effort is normal. No respiratory distress.  ?    Breath sounds: Normal breath sounds.  ?Abdominal:  ?   Palpations: Abdomen is soft.  ?   Tenderness: There is no abdominal tenderness.  ?Musculoskeletal:     ?   General: No swelling.  ?   Cervical back: Neck supple.  ?Skin: ?   General: Skin is warm and dry.  ?   Capillary Refill: Capillary refill takes less than 2 seconds.  ?Neurological:  ?   General: No focal deficit present.  ?   Mental Status: She is alert and oriented to person, place, and time.  ?   Cranial Nerves: No cranial nerve deficit.  ?   Sensory: No sensory deficit.  ?   Motor: No weakness.  ?   Coordination: Coordination normal.  ?   Comments: 5+ out of 5 strength throughout, normal sensation, no drift, normal finger-nose-finger, normal speech  ?Psychiatric:     ?   Mood and Affect: Mood normal.  ? ? ?ED Results / Procedures / Treatments   ?Labs ?(all labs ordered are listed, but only abnormal results are displayed) ?Labs Reviewed  ?CBC WITH DIFFERENTIAL/PLATELET - Abnormal; Notable for the following components:  ?    Result Value  ? WBC 12.2 (*)   ? Neutro Abs 8.5 (*)   ? All other components within normal limits  ?COMPREHENSIVE METABOLIC PANEL - Abnormal; Notable for the following components:  ? BUN 27 (*)   ? Total Bilirubin 1.6 (*)   ? GFR, Estimated 60 (*)   ? All other components within normal limits  ? ? ?EKG ?None ? ?Radiology ?CT Head Wo Contrast ? ?Result Date: 01/18/2022 ?CLINICAL DATA:  Altered mental status and recent fall, initial encounter EXAM: CT HEAD WITHOUT CONTRAST CT CERVICAL SPINE WITHOUT CONTRAST TECHNIQUE: Multidetector CT imaging of the head and cervical spine was performed following the standard protocol without intravenous contrast. Multiplanar CT image reconstructions of the cervical spine were also generated. RADIATION DOSE REDUCTION: This exam was performed according to the departmental dose-optimization program which includes automated exposure control, adjustment of the mA and/or kV according to patient size and/or use  of iterative reconstruction technique. COMPARISON:  12/21/2021 FINDINGS: CT HEAD FINDINGS Brain: No evidence of acute infarction, hemorrhage, hydrocephalus, extra-axial collection or mass lesion/mass effect. Chronic atrophic and ischemic changes are noted similar to that seen on the prior exam. Vascular: No hyperdense vessel or unexpected calcification. Skull: Normal. Negative for fracture or focal lesion. Sinuses/Orbits: No acute finding. Other: Fluid is noted within the mastoid air cells on the left. CT CERVICAL SPINE FINDINGS Alignment: Loss of the normal cervical lordosis is noted. Skull base  and vertebrae: 7 cervical segments are well visualized. Vertebral body height is well maintained. Multilevel osteophytic changes and facet hypertrophic changes are seen. No acute fracture or acute facet noted. Soft tissues and spinal canal: Surrounding soft tissue structures show multiple thyroid nodules worst on the left. Prior right thyroidectomy is noted. Upper chest: Visualized lung apices are within normal limits. Other: Left carotid stent is seen in satisfactory position. IMPRESSION: CT of the head: Chronic atrophic and ischemic changes without acute abnormality. Mild left mastoid effusion. CT of the cervical spine: Multilevel degenerative changes without acute abnormality. Multiple thyroid nodules. This has been evaluated on previous imaging. (ref: J Am Coll Radiol. 2015 Feb;12(2): 143-50). Electronically Signed   By: Alcide CleverMark  Lukens M.D.   On: 01/18/2022 20:34  ? ?CT Cervical Spine Wo Contrast ? ?Result Date: 01/18/2022 ?CLINICAL DATA:  Altered mental status and recent fall, initial encounter EXAM: CT HEAD WITHOUT CONTRAST CT CERVICAL SPINE WITHOUT CONTRAST TECHNIQUE: Multidetector CT imaging of the head and cervical spine was performed following the standard protocol without intravenous contrast. Multiplanar CT image reconstructions of the cervical spine were also generated. RADIATION DOSE REDUCTION: This exam was  performed according to the departmental dose-optimization program which includes automated exposure control, adjustment of the mA and/or kV according to patient size and/or use of iterative reconstruction techni

## 2022-01-18 NOTE — ED Triage Notes (Signed)
Pt brought to ER by her daughter. Pt is unsure of why she was brought to the ER. Pt is altered to time and place.  ?

## 2022-01-18 NOTE — ED Notes (Signed)
Spoke to daughter on phone she states she is on the way back to help intrepret ?

## 2022-02-05 ENCOUNTER — Ambulatory Visit (INDEPENDENT_AMBULATORY_CARE_PROVIDER_SITE_OTHER): Payer: Medicare Other

## 2022-02-05 DIAGNOSIS — I639 Cerebral infarction, unspecified: Secondary | ICD-10-CM

## 2022-02-05 LAB — CUP PACEART REMOTE DEVICE CHECK
Date Time Interrogation Session: 20230421230405
Implantable Pulse Generator Implant Date: 20210405

## 2022-02-20 NOTE — Progress Notes (Signed)
Carelink Summary Report / Loop Recorder 

## 2022-02-28 ENCOUNTER — Ambulatory Visit: Payer: Medicare Other | Admitting: Adult Health

## 2022-03-08 ENCOUNTER — Ambulatory Visit (INDEPENDENT_AMBULATORY_CARE_PROVIDER_SITE_OTHER): Payer: Medicare Other

## 2022-03-08 DIAGNOSIS — I639 Cerebral infarction, unspecified: Secondary | ICD-10-CM

## 2022-03-08 LAB — CUP PACEART REMOTE DEVICE CHECK
Date Time Interrogation Session: 20230525084403
Implantable Pulse Generator Implant Date: 20210405

## 2022-03-19 NOTE — Progress Notes (Signed)
Carelink Summary Report / Loop Recorder 

## 2022-04-10 ENCOUNTER — Ambulatory Visit (INDEPENDENT_AMBULATORY_CARE_PROVIDER_SITE_OTHER): Payer: Medicare Other

## 2022-04-10 DIAGNOSIS — I639 Cerebral infarction, unspecified: Secondary | ICD-10-CM

## 2022-04-10 LAB — CUP PACEART REMOTE DEVICE CHECK
Date Time Interrogation Session: 20230626230627
Implantable Pulse Generator Implant Date: 20210405

## 2022-04-18 ENCOUNTER — Ambulatory Visit: Payer: Medicare Other | Admitting: Adult Health

## 2022-04-23 ENCOUNTER — Encounter: Payer: Self-pay | Admitting: Adult Health

## 2022-04-23 ENCOUNTER — Ambulatory Visit (INDEPENDENT_AMBULATORY_CARE_PROVIDER_SITE_OTHER): Payer: Medicare Other | Admitting: Adult Health

## 2022-04-23 VITALS — BP 129/71 | HR 70 | Ht 65.0 in | Wt 109.0 lb

## 2022-04-23 DIAGNOSIS — I69998 Other sequelae following unspecified cerebrovascular disease: Secondary | ICD-10-CM

## 2022-04-23 DIAGNOSIS — I63232 Cerebral infarction due to unspecified occlusion or stenosis of left carotid arteries: Secondary | ICD-10-CM | POA: Diagnosis not present

## 2022-04-23 DIAGNOSIS — R531 Weakness: Secondary | ICD-10-CM | POA: Diagnosis not present

## 2022-04-23 DIAGNOSIS — R269 Unspecified abnormalities of gait and mobility: Secondary | ICD-10-CM

## 2022-04-23 DIAGNOSIS — I69398 Other sequelae of cerebral infarction: Secondary | ICD-10-CM | POA: Diagnosis not present

## 2022-04-23 NOTE — Progress Notes (Signed)
[facility] [address] [place]. [place] [address] [phone]       STROKE FOLLOW UP NOTE  [name] Date of Birth:  09-28-1946 Medical Record Number:  [id number]   Reason for Referral:  stroke follow up    SUBJECTIVE:   CHIEF COMPLAINT:  Chief Complaint  Patient presents with   Follow-up    RM 2  with daughter Denise Leon Pt is well and stable, no new concerns     HPI:   Update 04/23/2022 JM: Patient returns for stroke follow-up visit after prior visit 8 months ago.  Accompanied by her daughter.  Overall stable without new stroke/TIA symptoms. Reports continue right arm weakness, stable since prior visit. Use of RW, at times will forget to use in the home, no recent falls. Has remained on both aspirin and Plavix as well as atorvastatin, denies side effects.  Blood pressure today 129/71. Loop recorder has not shown atrial fibrillation thus far.  She has not yet had follow-up visit with vascular surgery.  She is routinely followed by PCP.  No new concerns at this time.     History provided for reference purposes only Initial visit 08/17/2021 JM:  Being seen today for hospital f/u accompanied by her daughter. She has been doing well. Still has some weakness in right arm although improving. Denies residual facial numbness. Short term memory slightly worsened since recent stroke compared to baseline. Recently completed home health therapies - has been continuing HEP with family. Continues to use RW - using prior. No recent falls. Lives with daughter - able to maintain ADLs independently. Denies new stroke/TIA symptoms. She remains on both Asprin and plavix - plans to stop plavix in Jan. per VVS recommendations - denies bleeding or bruising concerns. Remains on atorvastatin 80mg  daily - denies side effects. Prior LDL (05/2021) 84. Blood pressure today 122/84.  Routinely monitors at home.  Daughter concerned regarding carvedilol twice daily dosing as she will  reportedly forget to take evening dose - she questions if this could be taken only once daily.  Has not yet had f/u visit with PCP.  Loop recorder has not shown atrial fibrillation thus far.  No further concerns at this time   Stroke admission 06/03/2021 Denise Leon is a 76 y.o. 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  Personally reviewed hospitalization pertinent progress notes, lab work and imaging.  Evaluated by Dr. Erlinda Hong for acute left parietal occipital infarct secondary to left ICA high risk s/p left TCAR procedure 8/25.  EF 55 to 60%.  Loop recorder interrogated -no A. fib.  LDL 131.  A1c 5.8.  Initiated DAPT per vascular surgery.  Resumed atorvastatin 80 mg daily.  Incidental finding 2cm left thyroid nodule -advised outpatient follow-up with PCP.  Therapy eval's recommended CIR for decreased functional mobility     PERTINENT IMAGING  CT head Chronic ischemic microangiopathy and generalized atrophy without acute intracranial abnormality. ASPECTS 10.    CTA head & neck aortic atherosclerosis and left ICA mixed plaques but no significant stenosis MRI Patchy acute ischemic infarcts involving the left parieto-occipital region as above. Associated petechial hemorrhage without frank intraparenchymal hematoma. age-related cerebral atrophy with moderate to advanced chronic microvascular ischemic disease. 2D Echo - EF 55 - 60%. No cardiac source of emboli  identified.  Loop recorder no A fib LDL 131 HgbA1c 5.8        ROS:   14 system review of systems performed and negative with exception of those listed in HPI  PMH:  Past Medical History:  Diagnosis Date   Acute ischemic stroke (HCC) patchy L frontal infarcts s/p tPA 11/14/2019   Asthma     CKD (chronic kidney disease), stage IIIb 11/16/2019   Hyperlipidemia 11/16/2019   Hypertension    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   Palpitations 11/16/2019   Stroke (HCC) 11/14/2019   Thyroid nodule, L 11/16/2019    PSH:  Past Surgical History:  Procedure Laterality Date   HIP SURGERY     NECK SURGERY     TRANSCAROTID ARTERY REVASCULARIZATION  Left 06/08/2021   Procedure: LEFT TRANSCAROTID ARTERY REVASCULARIZATION;  Surgeon: Maeola Harman, MD;  Location: [facility] OR;  Service: Vascular;  Laterality: Left;   ULTRASOUND GUIDANCE FOR VASCULAR ACCESS Right 06/08/2021   Procedure: ULTRASOUND GUIDANCE FOR VASCULAR ACCESS, RIGHT FEMORAL VEIN;  Surgeon: Maeola Harman, MD;  Location: [facility] OR;  Service: Vascular;  Laterality: Right;    Social History:  Social History   Socioeconomic History   Marital status: Widowed    Spouse name: Not on file   Number of children: Not on file   Years of education: Not on file   Highest education level: Not on file  Occupational History   Not on file  Tobacco Use   Smoking status: Never   Smokeless tobacco: Never  Vaping Use   Vaping Use: Never used  Substance and Sexual Activity   Alcohol use: Never   Drug use: Never   Sexual activity: Not Currently  Other Topics Concern   Not on file  Social History Narrative   Not on file   Social Determinants of Health   Financial Resource Strain: Not on file  Food Insecurity: Not on file  Transportation Needs: Not on file  Physical Activity: Not on file  Stress: Not on file  Social Connections: Not on file  Intimate Partner Violence: Not on file    Family History:  Family History  Problem Relation Age of Onset   Lung disease Mother     Medications:   Current Outpatient Medications on File Prior to Visit  Medication Sig Dispense Refill   acetaminophen (TYLENOL) 325 MG tablet Take 2 tablets (650 mg total) by mouth every 4 (four) hours as needed for mild pain (or  temp > 37.5 C (99.5 F)).     amLODipine (NORVASC) 10 MG tablet Take 1 tablet (10 mg total) by mouth daily. 30 tablet 0   aspirin EC 81 MG tablet Take 1 tablet (81 mg total) by mouth daily. Swallow whole.     atorvastatin (LIPITOR) 80 MG tablet TAKE 1 TABLET(80 MG) BY MOUTH DAILY 90 tablet 3   carvedilol (COREG CR) 10 MG 24 hr capsule Take 1 capsule (10 mg total) by mouth daily. 30 capsule 5   cephALEXin (KEFLEX) 250 MG/5ML suspension Take 10 mLs (500 mg total) by mouth 3 (three) times daily. For 7 days 210 mL 0   clopidogrel (PLAVIX) 75 MG tablet TAKE 1 TABLET(75 MG) BY MOUTH DAILY 90 tablet 3   docusate sodium (COLACE) 100 MG capsule Take 1 capsule (100 mg total) by mouth 2 (two) times daily. (Patient taking differently: Take 100 mg by mouth daily as needed.) 10 capsule 0   DULoxetine (CYMBALTA) 30 MG capsule Take 1 capsule (  30 mg total) by mouth daily. 30 capsule 3   latanoprost (XALATAN) 0.005 % ophthalmic solution Place 1 drop into both eyes at bedtime. 2.5 mL 12   Vitamin A 2400 MCG (8000 UT) CAPS Take 2,400 mcg by mouth daily. 30 capsule 0   vitamin B-12 1000 MCG tablet Take 1 tablet (1,000 mcg total) by mouth daily. 30 tablet 0   vitamin C (ASCORBIC ACID) 500 MG tablet Take 1 tablet (500 mg total) by mouth daily. 30 tablet 0   No current facility-administered medications on file prior to visit.    Allergies:  No Known Allergies    OBJECTIVE:  Physical Exam  Vitals:   04/23/22 1429  BP: 129/71  Pulse: 70  Weight: 109 lb (49.4 kg)  Height: 5\' 5"  (1.651 m)   Body mass index is 18.14 kg/m. No results found.   General: Frail petite very pleasant elderly Asian female, seated, in no evident distress Head: head normocephalic and atraumatic.   Neck: supple with no carotid or supraclavicular bruits Cardiovascular: regular rate and rhythm, no murmurs Musculoskeletal: no deformity Skin:  no rash/petichiae Vascular:  Normal pulses all extremities   Neurologic Exam Mental  Status: Awake and fully alert.  No evidence of aphasia although language barrier.  Follows commands without difficulty.  Oriented to place and time. Recent memory impaired and remote memory intact. Attention span, concentration and fund of knowledge impaired with daughter providing majority of history. Mood and affect appropriate.  Cranial Nerves: Pupils equal, briskly reactive to light. Extraocular movements full without nystagmus. Visual fields full to confrontation. Hearing intact. Facial sensation intact. Face, tongue, palate moves normally and symmetrically.  Motor:  RUE: 4/5 with decreased hand dexterity RLE: 4+/5 hip flexor LUE: 5/5 LLE: 5/5 Sensory.: intact to touch , pinprick , position and vibratory sensation.  Coordination: Rapid alternating movements normal in all extremities except decreased right hand. Finger-to-nose mild ataxia in proportion to weakness and heel-to-shin performed accurately bilaterally. Gait and Station: Arises from chair without difficulty. Stance is slightly hunched. Gait demonstrates short shuffled quick steps with mild unsteadiness and use of rolling walker.  Tandem walk and heel toe not attempted.  Reflexes: 1+ and symmetric. Toes downgoing.         ASSESSMENT: Kareli Hossain is a 76 y.o. year old female with acute left parietal occipital infarct on 06/03/2021 secondary to left ICA high risk plaque s/p left TCAR 8/25. Vascular risk factors include history of stroke (left frontal infarct s/p tPA and ILR 10/2019), carotid arthrosclerosis, HTN, HLD and advanced age.      PLAN:  Left parietal occipital stroke History of prior stroke Residual deficit: R hemiparesis, gait impairment and cognitive impairment. Continued use of RW at all times unless otherwise instructed. Loop recorder has not shown atrial fibrillation thus far Continue aspirin 81 mg daily  and atorvastatin for secondary stroke prevention and s/p TCAR.  Advised needed f/u visit with VVS to discuss  ongoing need on Plavix Discussed secondary stroke prevention measures and importance of close PCP follow up for aggressive stroke risk factor management including BP goal<130/90 and HLD with LDL goal<70 .  Stroke labs 12/2021: LDL 64, A1c 5.3 I have gone over the pathophysiology of stroke, warning signs and symptoms, risk factors and their management in some detail with instructions to go to the closest emergency room for symptoms of concern.  Left ICA plaque:  10/2019 CTA head/neck left ICA mixed plaques but no significant stenosis 05/2021 CTA head/neck no significant changes S/p TCAR  06/08/2021 by Dr. Donzetta Matters 06/22/2021 carotid duplex patent R ICA stent Advised to contact VVS to schedule f/u visit with repeat carotid ultrasound and discuss ongoing need of Plavix    Doing well from stroke standpoint without further recommendations and risk factors are managed by PCP. She may follow up PRN, as usual for our patients who are strictly being followed for stroke. If any new neurological issues should arise, request PCP place referral for evaluation by one of our neurologists. Thank you.     CC:  PCP: Jenel Lucks, PA-C    I spent 31 minutes of face-to-face and non-face-to-face time with patient and daughter.  This included previsit chart review, lab review, study review, electronic health record documentation, patient and daughter education regarding prior stroke including etiology and residual deficits, secondary stroke prevention measures and importance of managing stroke risk factors and answered all other questions to patient and daughters satisfaction   Frann Rider, [facility]  [facility] [address] [place], [place] [address]  Phone [phone] Fax [phone] Note: This document was prepared with digital dictation and possible smart phrase technology. Any transcriptional errors that result from this process are unintentional.

## 2022-04-23 NOTE — Patient Instructions (Addendum)
Continue aspirin 325 mg daily  and atorvastatin for secondary stroke prevention  Please ensure follow-up with vascular surgery for repeat carotid ultrasound and discussion of ongoing use of Plavix - [phone]   Continue to follow up with PCP regarding cholesterol and blood pressure management  Maintain strict control of hypertension with blood pressure goal below 130/90 and cholesterol with LDL cholesterol (bad cholesterol) goal below 70 mg/dL.   Signs of a Stroke? Follow the BEFAST method:  Balance Watch for a sudden loss of balance, trouble with coordination or vertigo Eyes Is there a sudden loss of vision in one or both eyes? Or double vision?  Face: Ask the person to smile. Does one side of the face droop or is it numb?  Arms: Ask the person to raise both arms. Does one arm drift downward? Is there weakness or numbness of a leg? Speech: Ask the person to repeat a simple phrase. Does the speech sound slurred/strange? Is the person confused ? Time: If you observe any of these signs, call [phone].       Thank you for coming to see [place] at [facility]. I hope we have been able to provide you high quality care today.  You may receive a patient satisfaction survey over the next few weeks. We would appreciate your feedback and comments so that we may continue to improve ourselves and the health of our patients.

## 2022-05-01 NOTE — Progress Notes (Signed)
Carelink Summary Report / Loop Recorder 

## 2022-05-14 ENCOUNTER — Ambulatory Visit (INDEPENDENT_AMBULATORY_CARE_PROVIDER_SITE_OTHER): Payer: Medicare Other

## 2022-05-14 DIAGNOSIS — I639 Cerebral infarction, unspecified: Secondary | ICD-10-CM | POA: Diagnosis not present

## 2022-05-14 LAB — CUP PACEART REMOTE DEVICE CHECK
Date Time Interrogation Session: 20230729230544
Implantable Pulse Generator Implant Date: 20210405

## 2022-06-16 NOTE — Progress Notes (Signed)
Carelink Summary Report / Loop Recorder 

## 2022-06-18 LAB — CUP PACEART REMOTE DEVICE CHECK
Date Time Interrogation Session: 20230831230320
Implantable Pulse Generator Implant Date: 20210405

## 2022-06-19 ENCOUNTER — Ambulatory Visit (INDEPENDENT_AMBULATORY_CARE_PROVIDER_SITE_OTHER): Payer: Medicare Other

## 2022-06-19 DIAGNOSIS — I639 Cerebral infarction, unspecified: Secondary | ICD-10-CM

## 2022-07-09 ENCOUNTER — Telehealth: Payer: Self-pay

## 2022-07-09 NOTE — Telephone Encounter (Signed)
LINQ alert received.  1 new AF event, 62min in duration, mean HR 120. Burden 0%, No OAC, ASA only Route to triage.  Reviewed with Lily Kocher, agrees AF is noted and AF clinic referral appropriate. Attempted to contact patient via interpreter Elba Barman, [place]: [phone], [phone]). No answer to either number on file. VM was left on mobil #. Will attempt at later time.

## 2022-07-10 NOTE — Progress Notes (Signed)
Carelink Summary Report / Loop Recorder 

## 2022-07-18 LAB — CUP PACEART REMOTE DEVICE CHECK
Date Time Interrogation Session: 20231003230317
Implantable Pulse Generator Implant Date: 20210405

## 2022-07-23 ENCOUNTER — Ambulatory Visit (INDEPENDENT_AMBULATORY_CARE_PROVIDER_SITE_OTHER): Payer: Medicare Other

## 2022-07-23 DIAGNOSIS — I639 Cerebral infarction, unspecified: Secondary | ICD-10-CM | POA: Diagnosis not present

## 2022-07-30 IMAGING — DX DG CHEST 1V PORT
1 series · 1 of 1 positions shown · non-contrast
Comparison: Radiograph 3 hours prior

CLINICAL DATA: Chest pain and shortness of breath.

EXAM:
PORTABLE CHEST 1 VIEW

[chest ap]
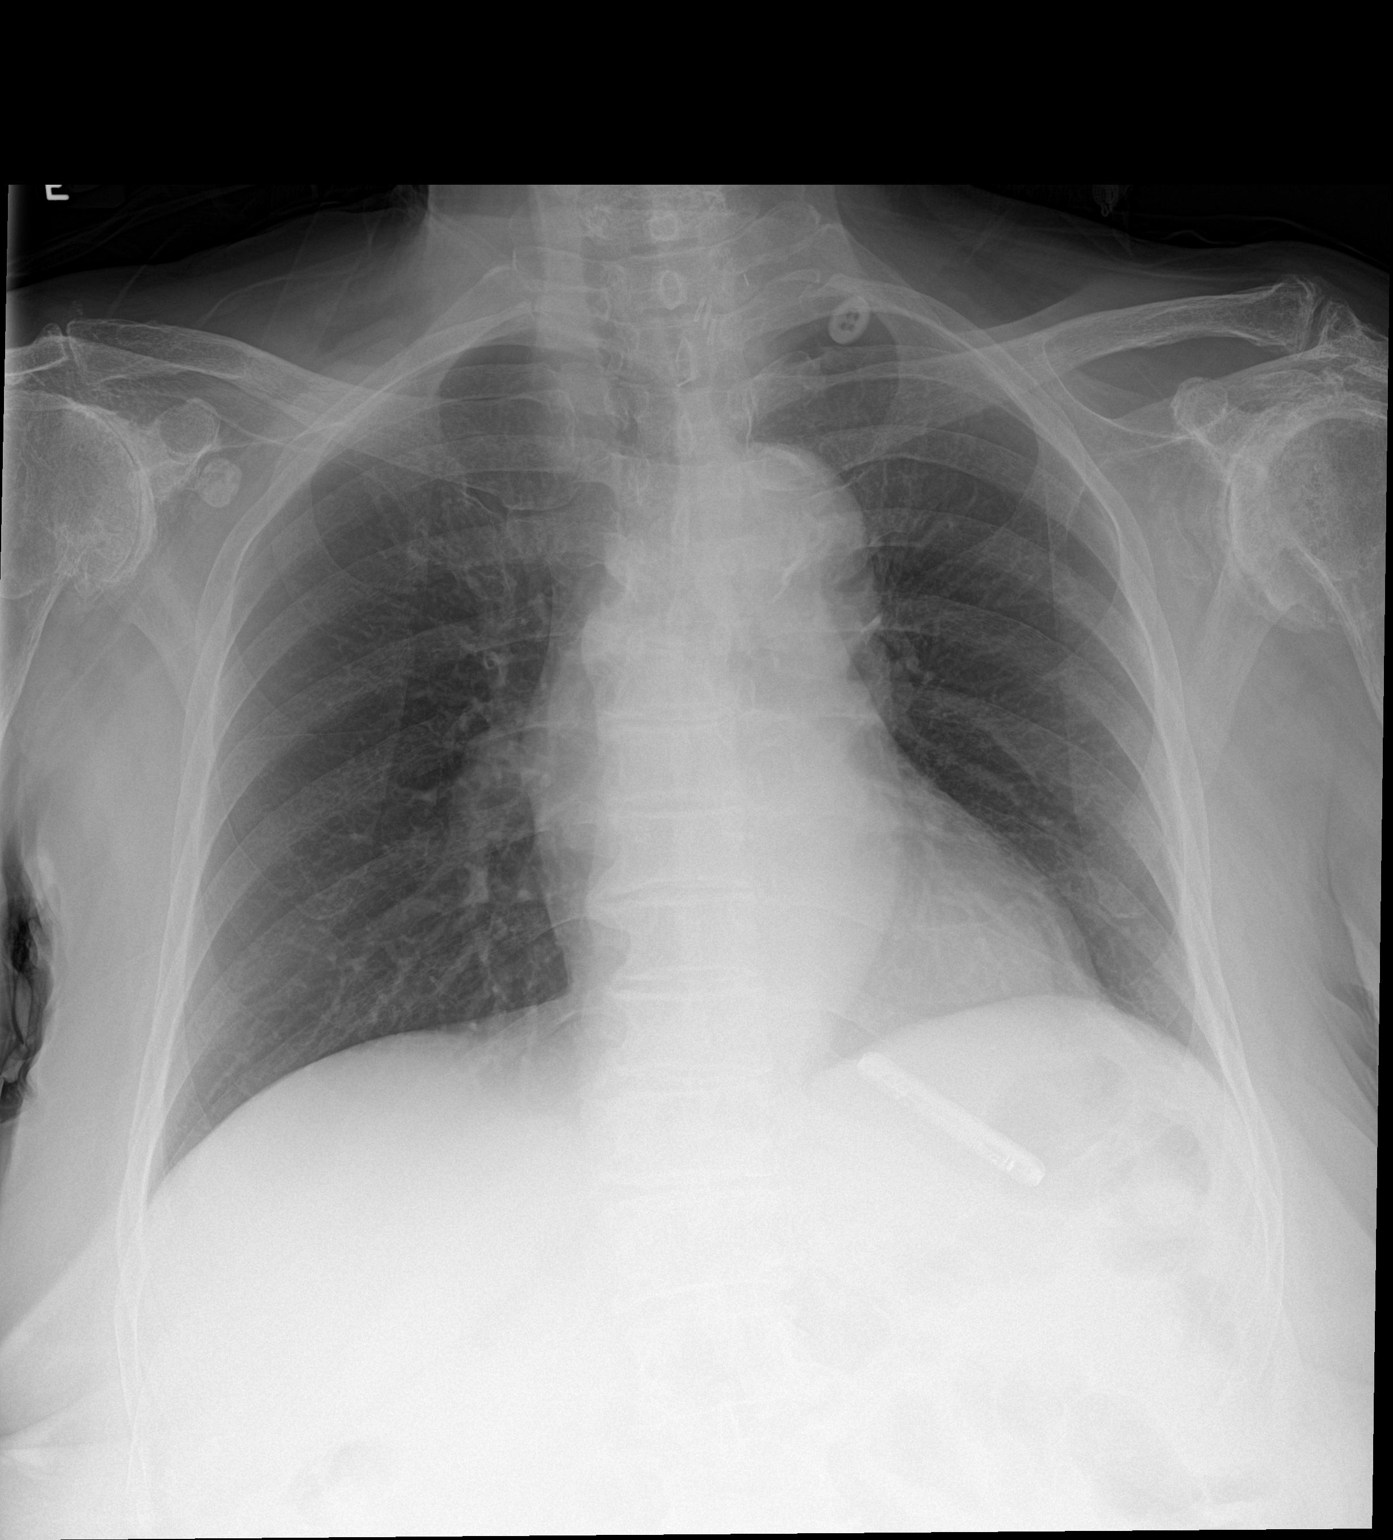

[1 of 1 positions shown; findings below may reference images not displayed]

FINDINGS: The heart is normal in size. Stable mediastinal contours with aortic
atherosclerosis and tortuosity. Implanted loop recorder projects
over the left chest wall. The lungs are clear. Pulmonary vasculature
is normal. No consolidation, pleural effusion, or pneumothorax. No
acute osseous abnormalities are seen. Again seen advanced bilateral
shoulder osteoarthritis.
IMPRESSION: No acute chest findings.

## 2022-07-30 IMAGING — CT CT HEAD W/O CM
3 series · 15 of 47 positions shown, 18 images · non-contrast
Comparison: September 24, 2021.

CLINICAL DATA: Neuro deficit.



[Series 2: head wo · axial · 0.45mm/px · z∈[+188,+314]mm · 9 of 30 slices shown, 12 images]
[im 3/30  brain]
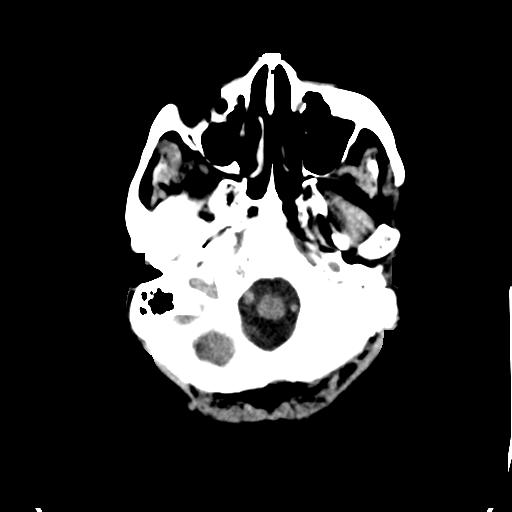
[im 3/30  bone]
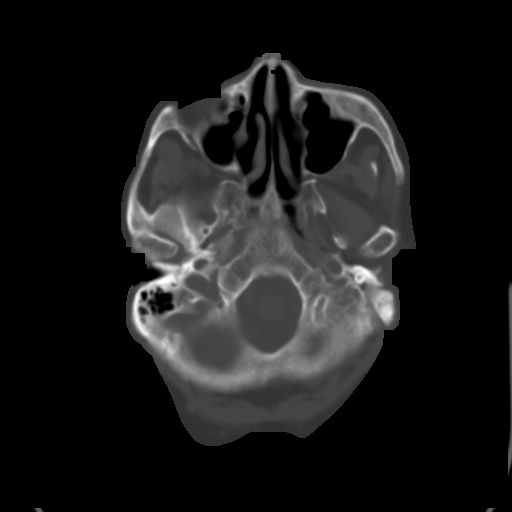
[im 6/30  brain]
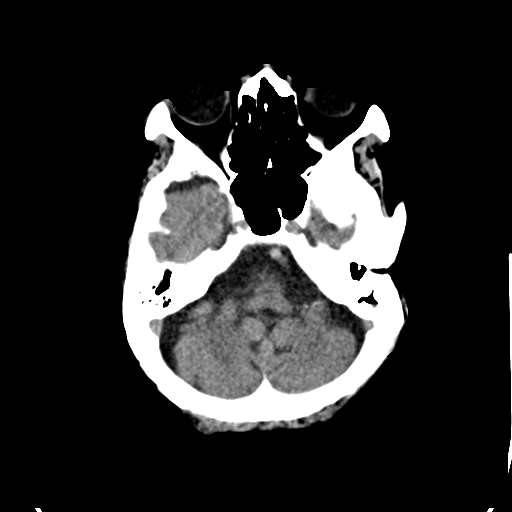
[im 9/30  brain]
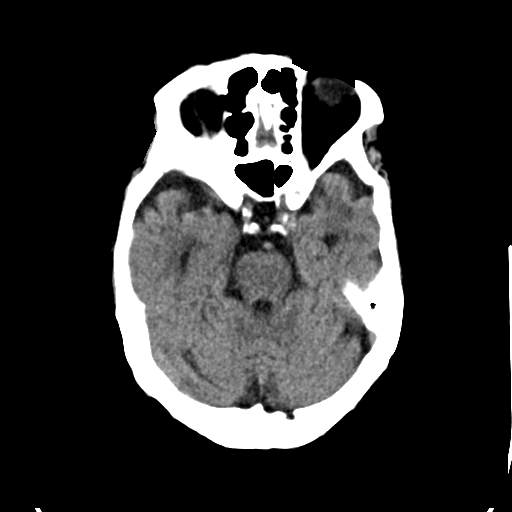
[im 12/30  brain]
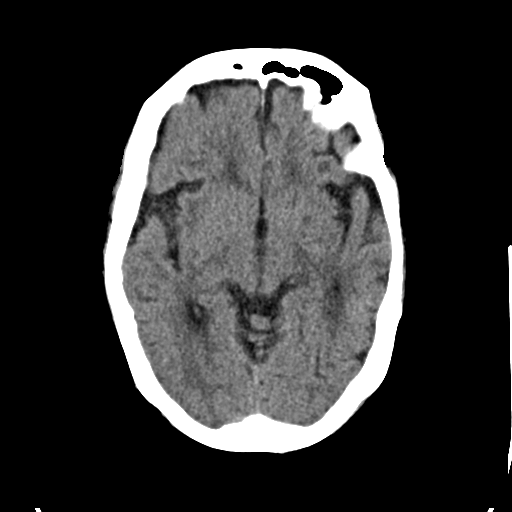
[im 16/30  brain]
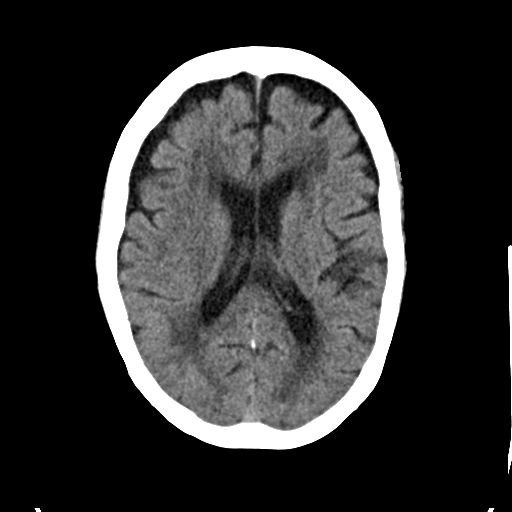
[im 16/30  bone]
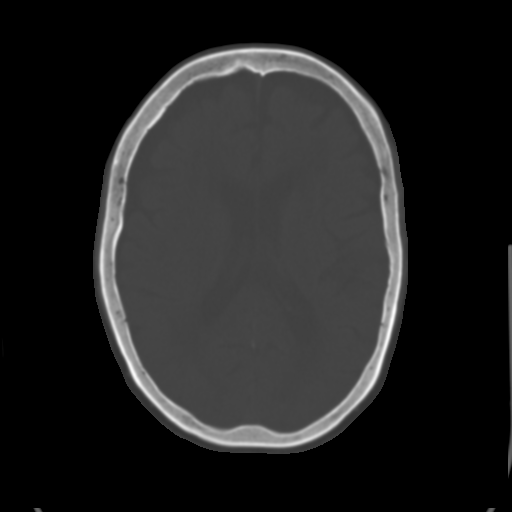
[im 19/30  brain]
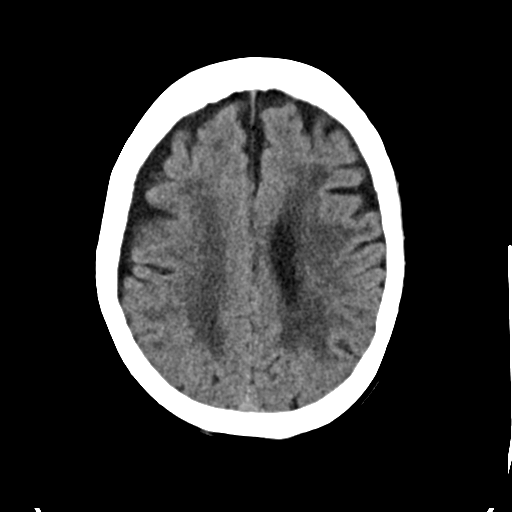
[im 22/30  brain]
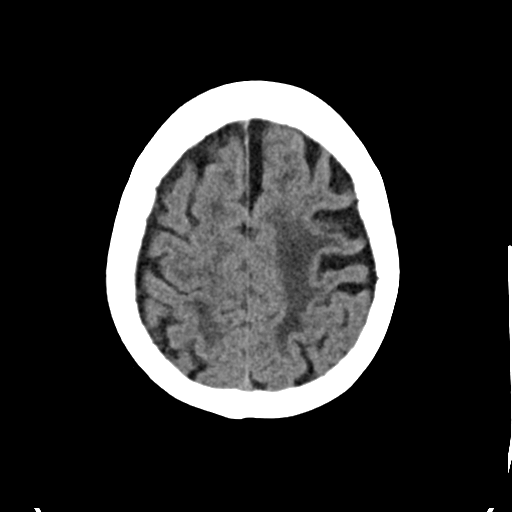
[im 25/30  brain]
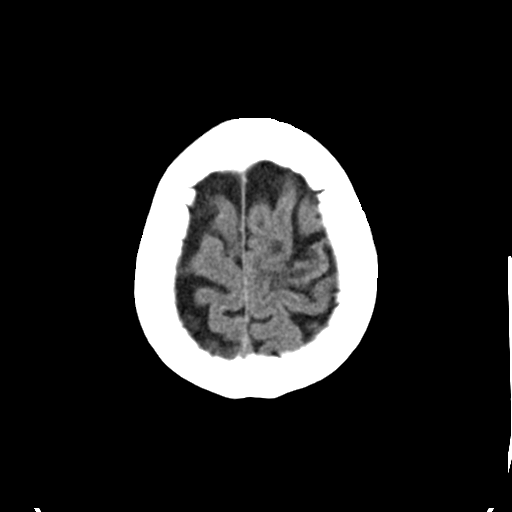
[im 28/30  brain]
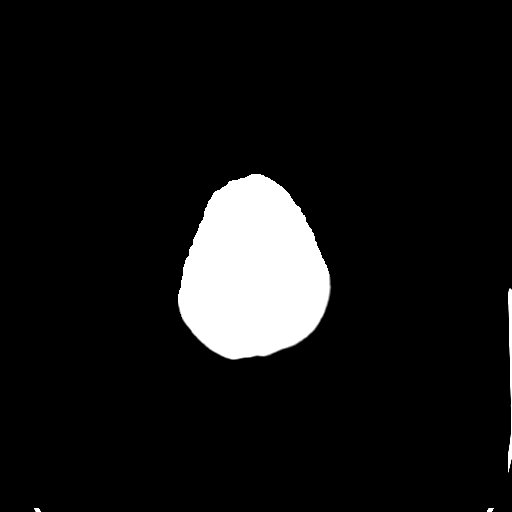
[im 28/30  bone]
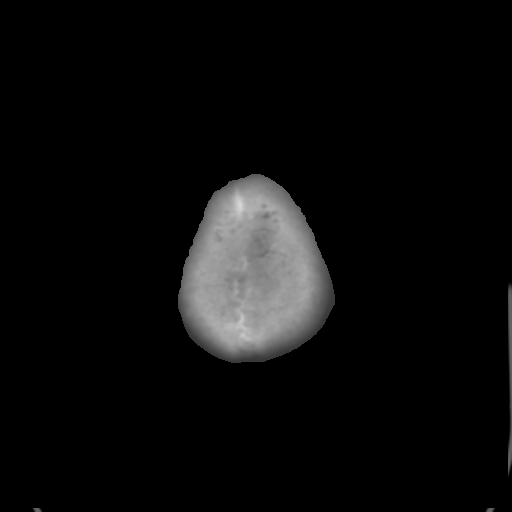

[Series 4: coronal soft · coronal · 0.30mm/px · 3 of 74 slices shown]
[im 25/74  brain]
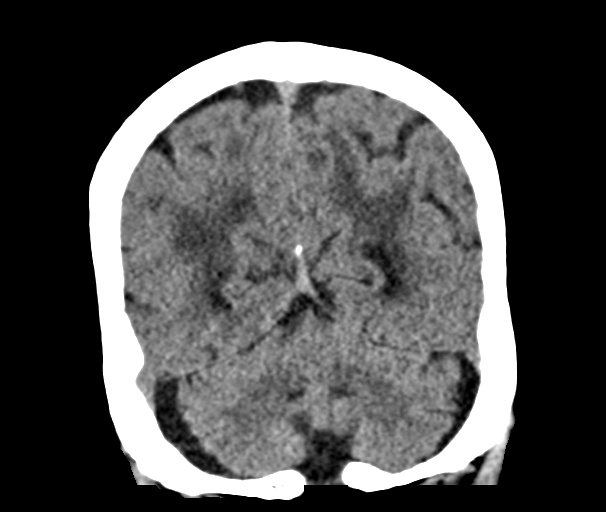
[im 33/74  brain]
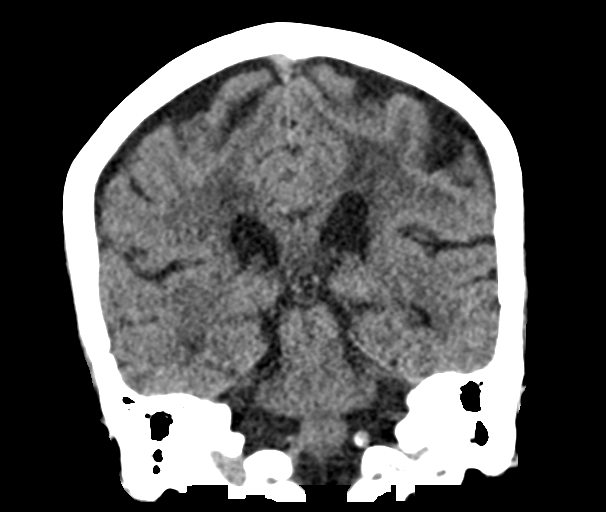
[im 41/74  brain]
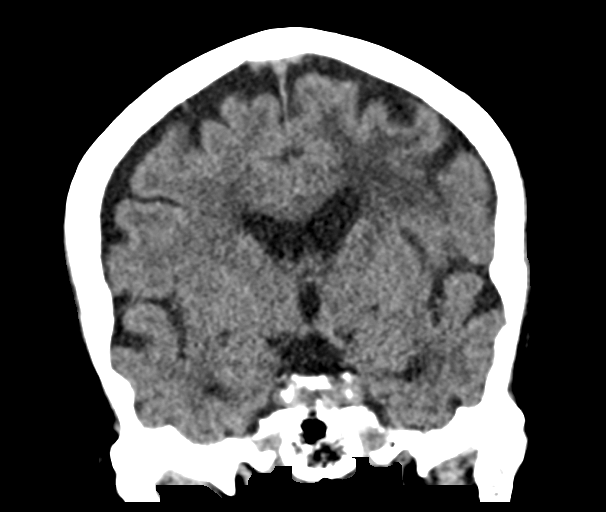

[Series 5: sag soft · sagittal · 0.29mm/px · 3 of 65 slices shown]
[im 22/65  brain]
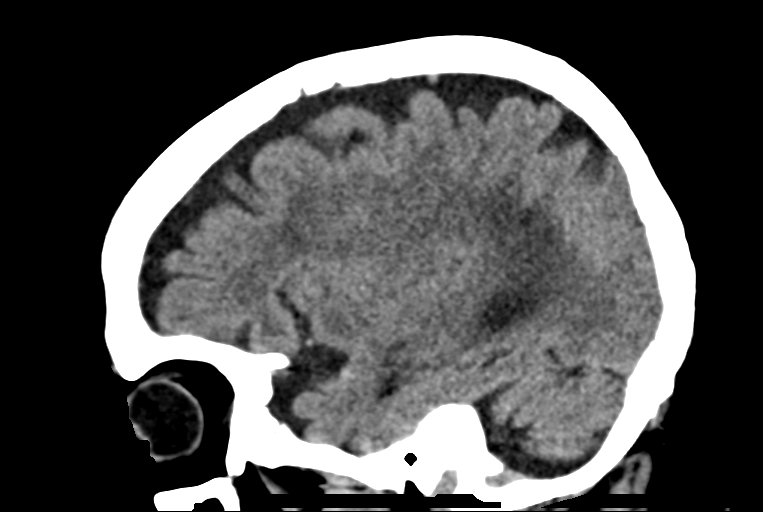
[im 33/65  brain]
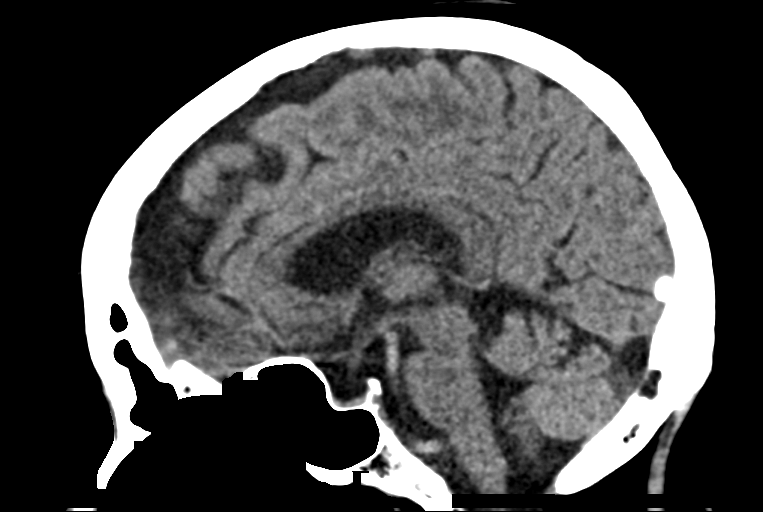
[im 43/65  brain]
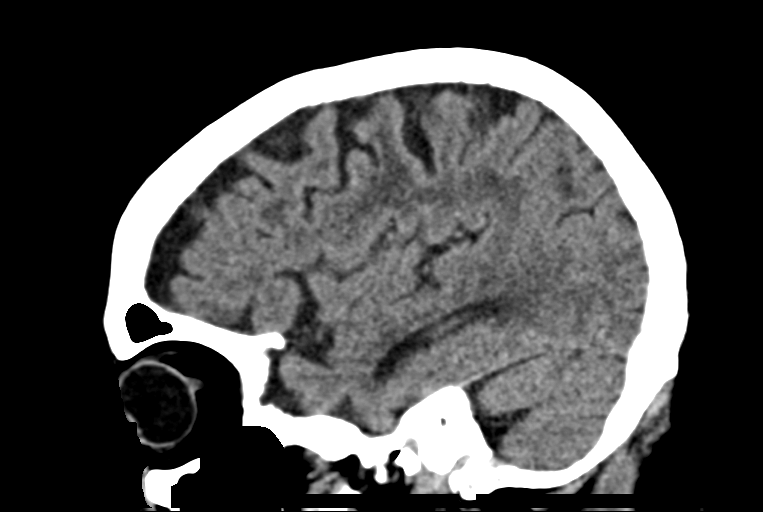

[15 of 47 positions shown; findings below may reference images not displayed]

FINDINGS: Brain: Mild diffuse cortical atrophy is noted. Mild chronic ischemic
white matter disease is noted. No mass effect or midline shift is
noted. Ventricular size is within normal limits. There is no
evidence of mass lesion, hemorrhage or acute infarction.

Vascular: No hyperdense vessel or unexpected calcification.

Skull: Normal. Negative for fracture or focal lesion.

Sinuses/Orbits: No acute finding.

Other: None.
IMPRESSION: No acute intracranial abnormality seen.

## 2022-08-01 NOTE — Progress Notes (Signed)
Carelink Summary Report / Loop Recorder 

## 2022-08-27 ENCOUNTER — Ambulatory Visit (INDEPENDENT_AMBULATORY_CARE_PROVIDER_SITE_OTHER): Payer: Medicare Other

## 2022-08-27 DIAGNOSIS — I63412 Cerebral infarction due to embolism of left middle cerebral artery: Secondary | ICD-10-CM

## 2022-08-27 IMAGING — DX DG CHEST 1V PORT
1 series · 1 of 1 positions shown · non-contrast
Comparison: 12/21/2021

CLINICAL DATA: Fall today.

EXAM:
PORTABLE CHEST 1 VIEW

[chest ap]
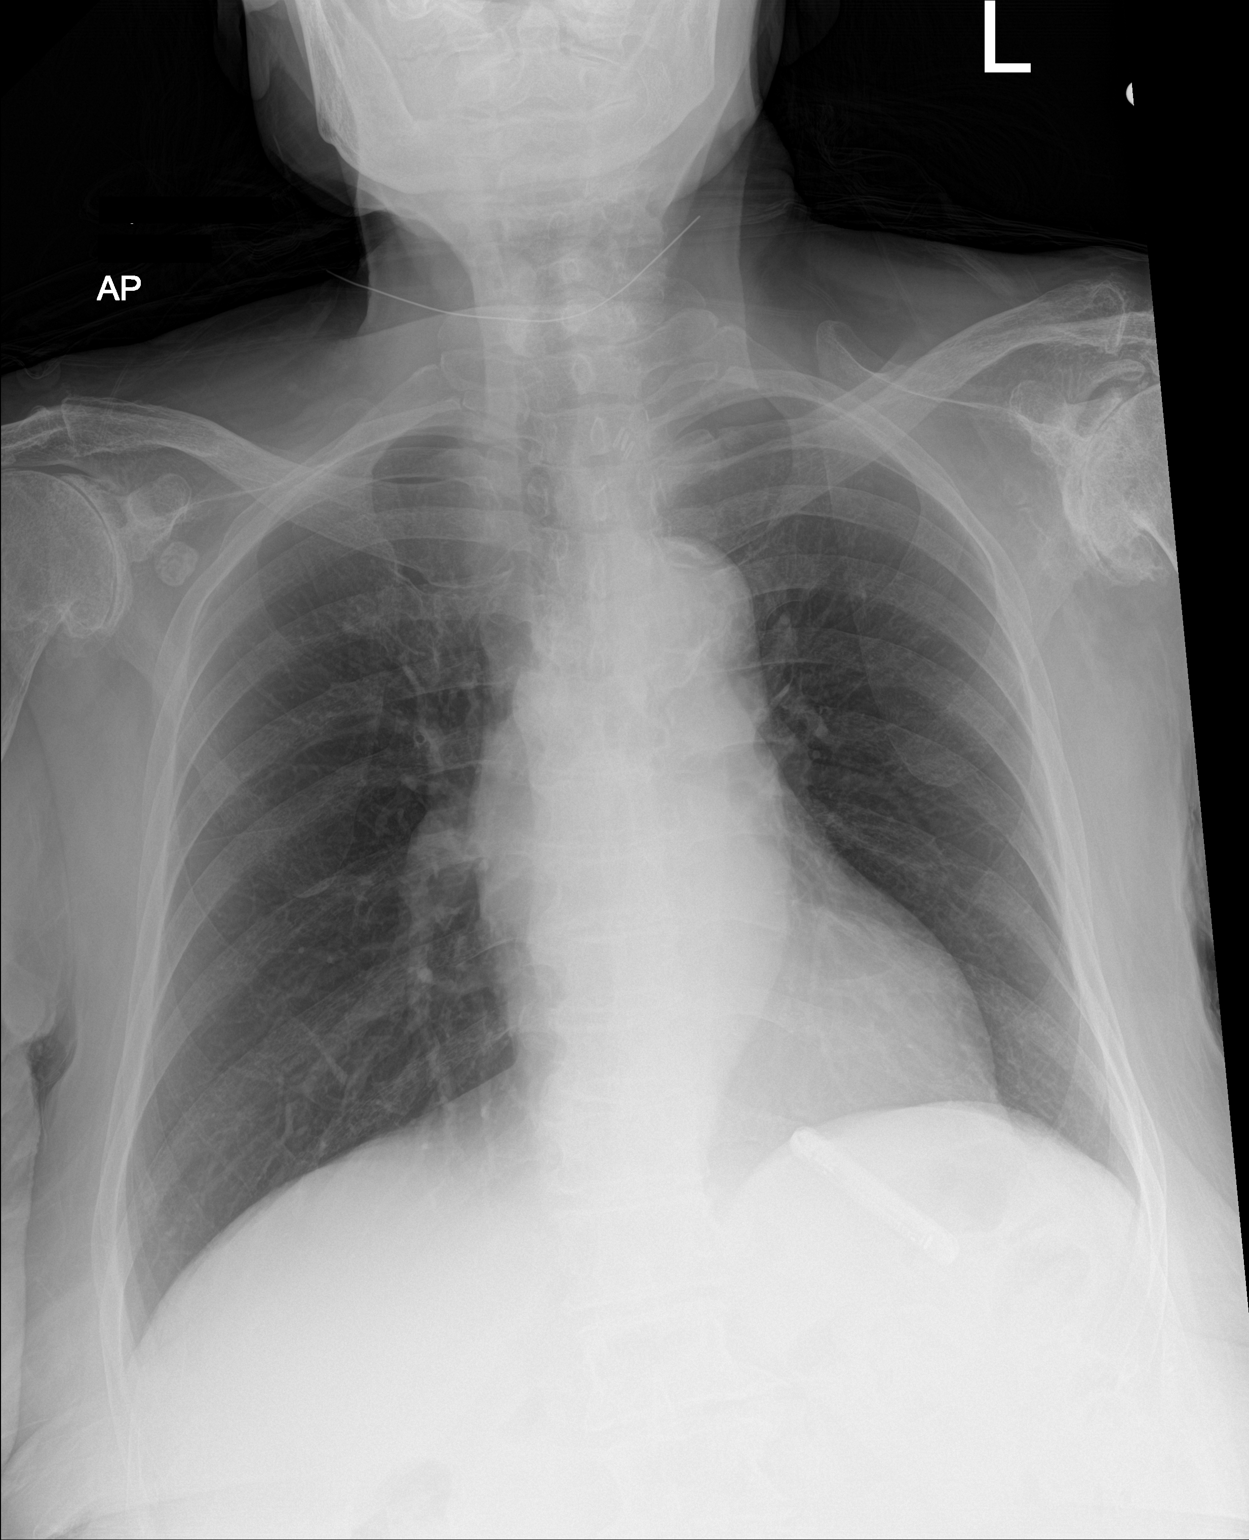

[1 of 1 positions shown; findings below may reference images not displayed]

FINDINGS: Heart size is normal. Stable tortuosity and atherosclerotic
calcification of the thoracic aorta. Both lungs are clear. Severe
bilateral glenohumeral joint osteoarthritis incidentally noted.
IMPRESSION: No active disease.

## 2022-08-27 IMAGING — CT CT CERVICAL SPINE W/O CM
3 of 4 series · 12 of 33 positions shown, 14 images · non-contrast
Comparison: 12/21/2021

CLINICAL DATA: Altered mental status and recent fall, initial
encounter



[Series 3: c_spine 2.0 i30s 3 · axial · 0.37mm/px · z∈[+226,+336]mm · 4 of 83 slices shown, 5 images]
[im 14/83  soft-tissue]
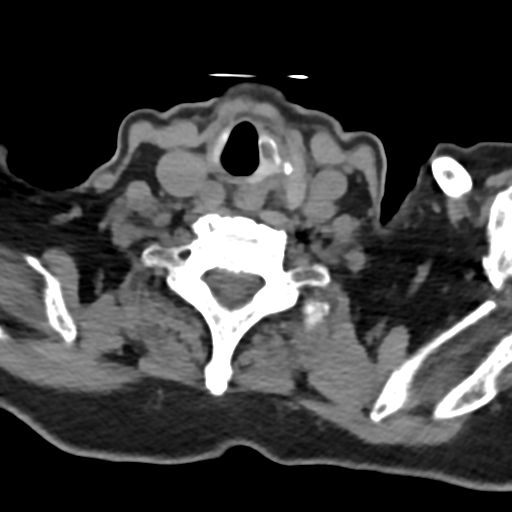
[im 14/83  bone]
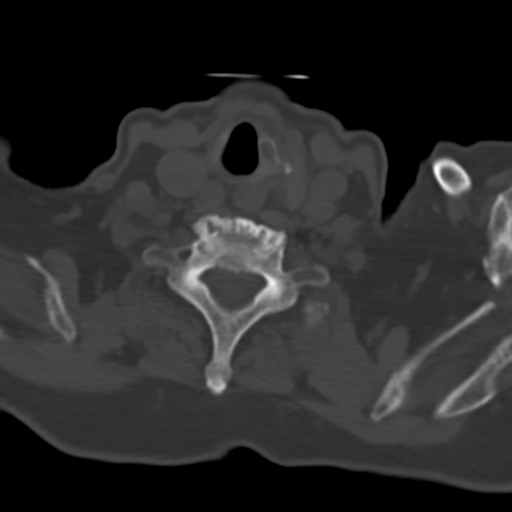
[im 28/83  bone]
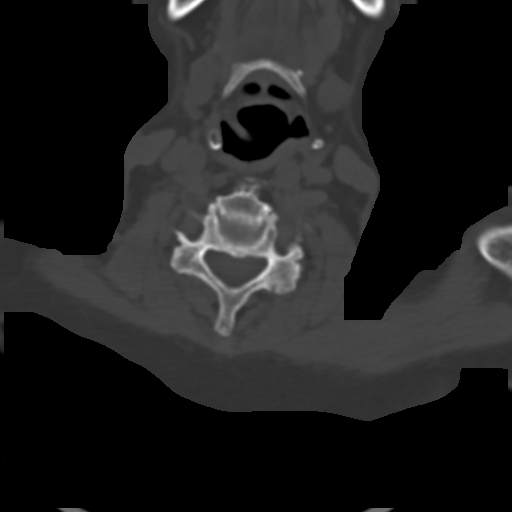
[im 55/83  bone]
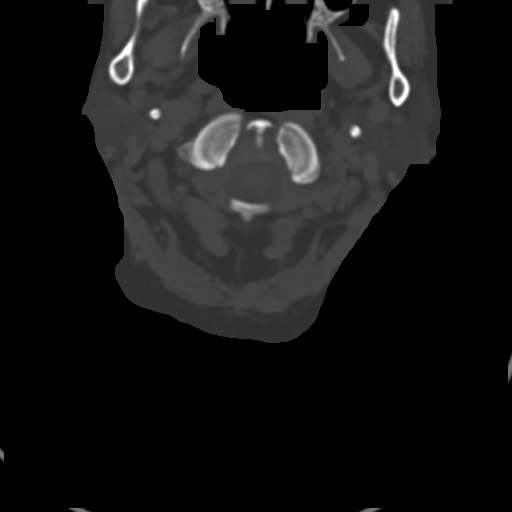
[im 69/83  bone]
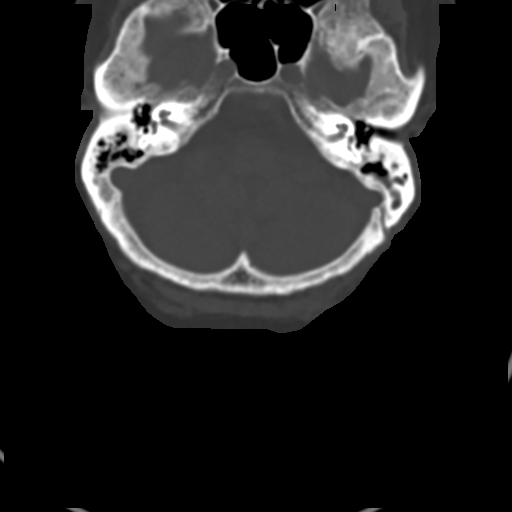

[Series 5: coronals · coronal · 0.24mm/px · 3 of 61 slices shown]
[im 13/61  bone]
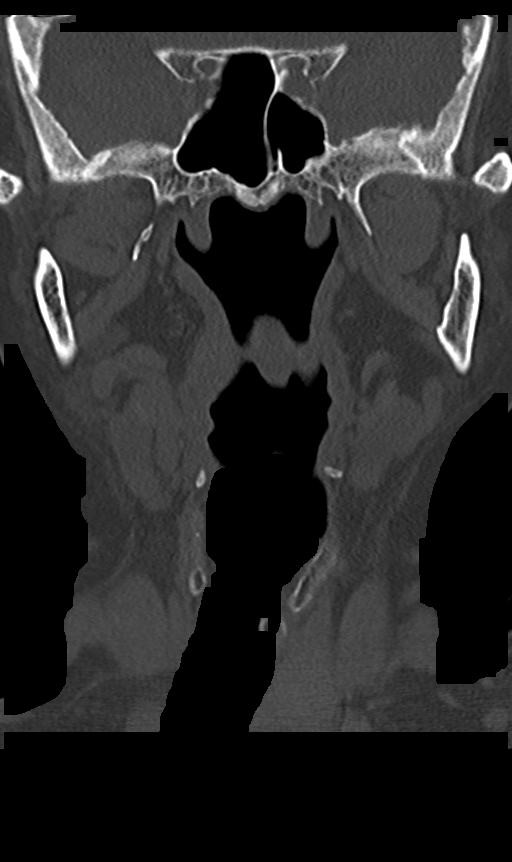
[im 25/61  bone]
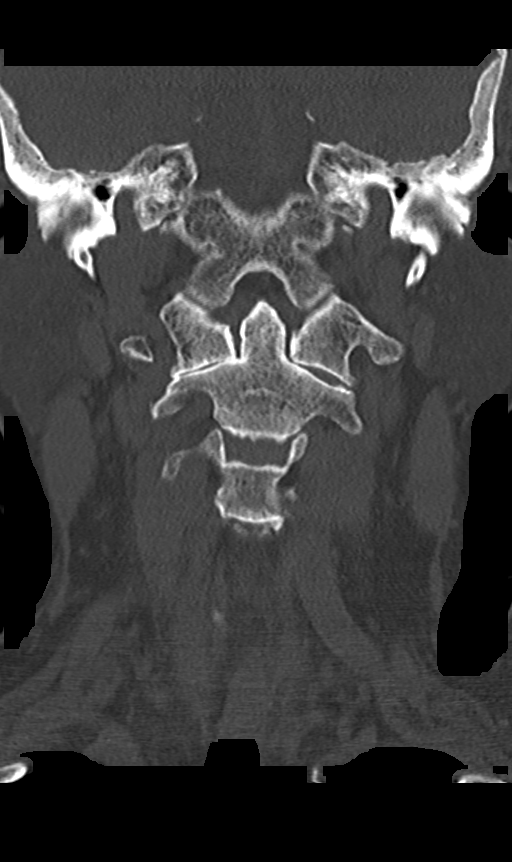
[im 37/61  bone]
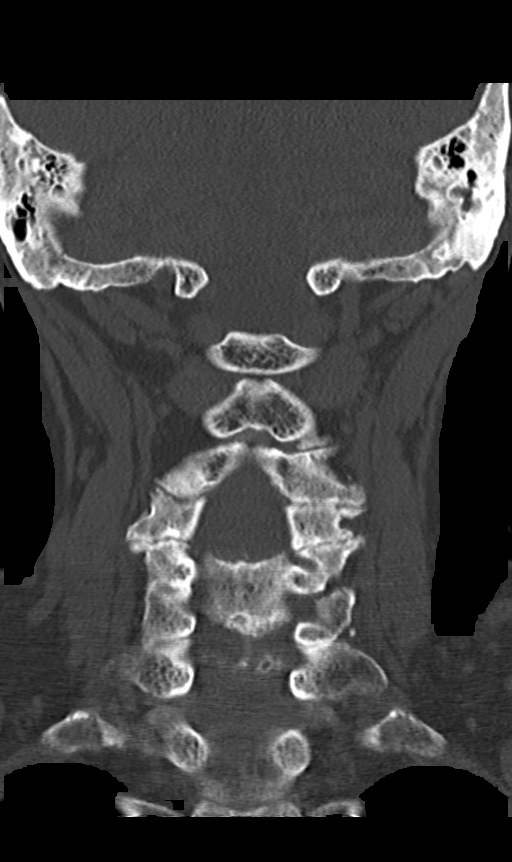

[Series 6: sagittals · sagittal · 0.32mm/px · 5 of 61 slices shown, 6 images]
[im 21/61  bone]
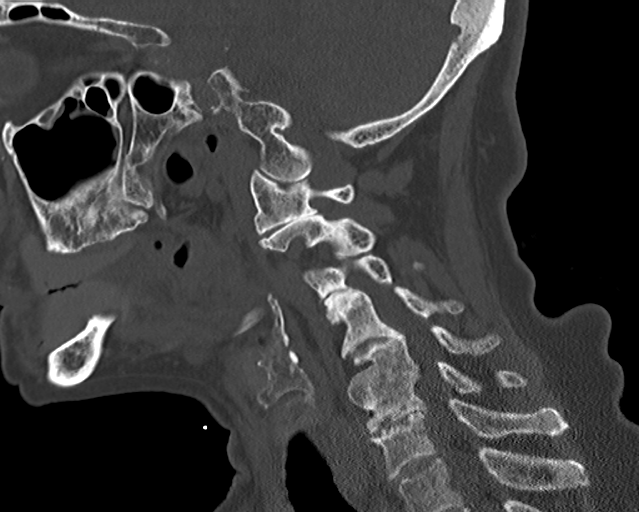
[im 26/61  bone]
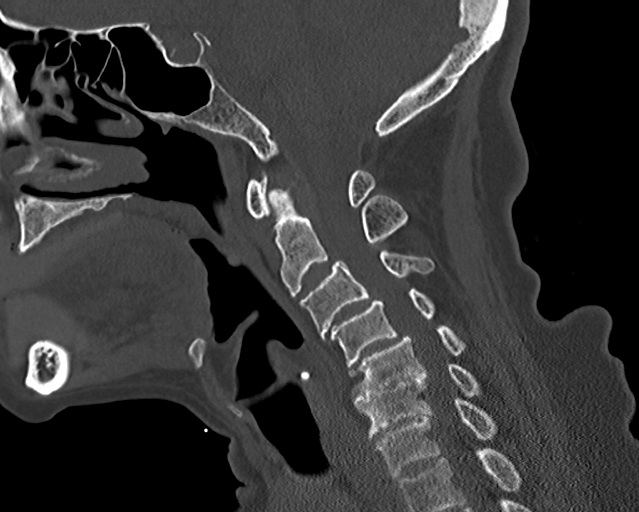
[im 31/61  soft-tissue]
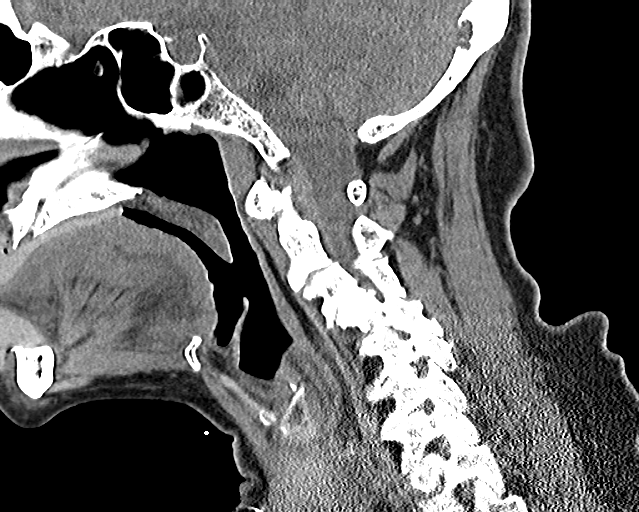
[im 31/61  bone]
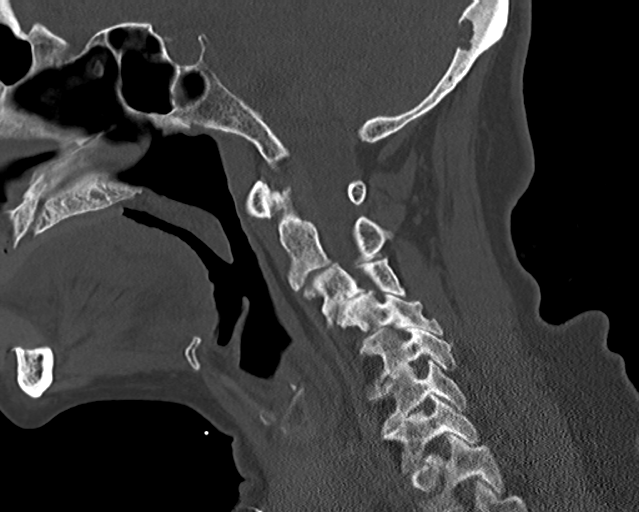
[im 36/61  bone]
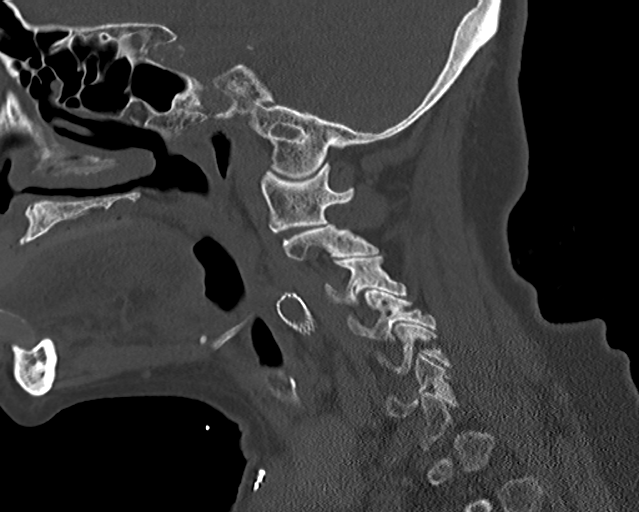
[im 41/61  bone]
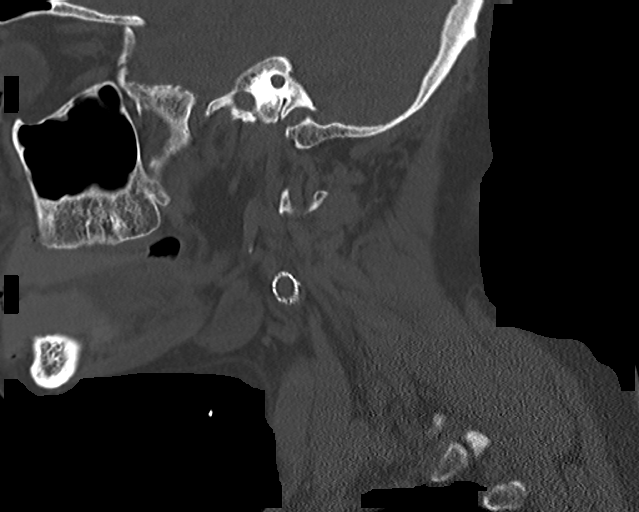

[12 of 33 positions shown; findings below may reference images not displayed]

FINDINGS: CT HEAD FINDINGS

Brain: No evidence of acute infarction, hemorrhage, hydrocephalus,
extra-axial collection or mass lesion/mass effect. Chronic atrophic
and ischemic changes are noted similar to that seen on the prior
exam.

Vascular: No hyperdense vessel or unexpected calcification.

Skull: Normal. Negative for fracture or focal lesion.

Sinuses/Orbits: No acute finding.

Other: Fluid is noted within the mastoid air cells on the left.

CT CERVICAL SPINE FINDINGS

Alignment: Loss of the normal cervical lordosis is noted.

Skull base and vertebrae: 7 cervical segments are well visualized.
Vertebral body height is well maintained. Multilevel osteophytic
changes and facet hypertrophic changes are seen. No acute fracture
or acute facet noted.

Soft tissues and spinal canal: Surrounding soft tissue structures
show multiple thyroid nodules worst on the left. Prior right
thyroidectomy is noted.

Upper chest: Visualized lung apices are within normal limits.

Other: Left carotid stent is seen in satisfactory position.
IMPRESSION: CT of the head: Chronic atrophic and ischemic changes without acute
abnormality.

Mild left mastoid effusion.

CT of the cervical spine: Multilevel degenerative changes without
acute abnormality.

Multiple thyroid nodules. This has been evaluated on previous
imaging. (ref: [HOSPITAL]. [DATE]): 143-50).

## 2022-08-28 LAB — CUP PACEART REMOTE DEVICE CHECK
Date Time Interrogation Session: 20231112230125
Implantable Pulse Generator Implant Date: 20210405

## 2022-10-01 ENCOUNTER — Ambulatory Visit (INDEPENDENT_AMBULATORY_CARE_PROVIDER_SITE_OTHER): Payer: Medicare Other

## 2022-10-01 DIAGNOSIS — I63412 Cerebral infarction due to embolism of left middle cerebral artery: Secondary | ICD-10-CM | POA: Diagnosis not present

## 2022-10-02 LAB — CUP PACEART REMOTE DEVICE CHECK
Date Time Interrogation Session: 20231217230410
Implantable Pulse Generator Implant Date: 20210405

## 2022-10-02 NOTE — Progress Notes (Signed)
Carelink Summary Report / Loop Recorder 

## 2022-11-02 ENCOUNTER — Ambulatory Visit (INDEPENDENT_AMBULATORY_CARE_PROVIDER_SITE_OTHER): Payer: Medicare Other

## 2022-11-02 DIAGNOSIS — I63412 Cerebral infarction due to embolism of left middle cerebral artery: Secondary | ICD-10-CM | POA: Diagnosis not present

## 2022-11-05 LAB — CUP PACEART REMOTE DEVICE CHECK
Date Time Interrogation Session: 20240119230426
Implantable Pulse Generator Implant Date: 20210405

## 2022-11-05 NOTE — Progress Notes (Signed)
Carelink Summary Report / Loop Recorder

## 2022-11-19 NOTE — Progress Notes (Signed)
Carelink Summary Report / Loop Recorder 

## 2022-12-05 ENCOUNTER — Ambulatory Visit (INDEPENDENT_AMBULATORY_CARE_PROVIDER_SITE_OTHER): Payer: Medicare Other

## 2022-12-05 DIAGNOSIS — I63412 Cerebral infarction due to embolism of left middle cerebral artery: Secondary | ICD-10-CM | POA: Diagnosis not present

## 2022-12-06 LAB — CUP PACEART REMOTE DEVICE CHECK
Date Time Interrogation Session: 20240221230720
Implantable Pulse Generator Implant Date: 20210405

## 2023-01-02 NOTE — Progress Notes (Signed)
Carelink Summary Report / Loop Recorder 

## 2023-01-07 ENCOUNTER — Ambulatory Visit (INDEPENDENT_AMBULATORY_CARE_PROVIDER_SITE_OTHER): Payer: Medicare Other

## 2023-01-07 DIAGNOSIS — I63412 Cerebral infarction due to embolism of left middle cerebral artery: Secondary | ICD-10-CM | POA: Diagnosis not present

## 2023-01-08 LAB — CUP PACEART REMOTE DEVICE CHECK
Date Time Interrogation Session: 20240325230344
Implantable Pulse Generator Implant Date: 20210405

## 2023-02-11 ENCOUNTER — Ambulatory Visit (INDEPENDENT_AMBULATORY_CARE_PROVIDER_SITE_OTHER): Payer: Medicare Other

## 2023-02-11 DIAGNOSIS — I639 Cerebral infarction, unspecified: Secondary | ICD-10-CM

## 2023-02-11 LAB — CUP PACEART REMOTE DEVICE CHECK
Date Time Interrogation Session: 20240427230516
Implantable Pulse Generator Implant Date: 20210405

## 2023-02-15 NOTE — Progress Notes (Signed)
Carelink Summary Report / Loop Recorder 

## 2023-03-14 NOTE — Progress Notes (Signed)
Carelink Summary Report / Loop Recorder 

## 2023-03-18 ENCOUNTER — Ambulatory Visit (INDEPENDENT_AMBULATORY_CARE_PROVIDER_SITE_OTHER): Payer: Medicare Other

## 2023-03-18 DIAGNOSIS — I639 Cerebral infarction, unspecified: Secondary | ICD-10-CM

## 2023-03-18 LAB — CUP PACEART REMOTE DEVICE CHECK
Date Time Interrogation Session: 20240602230142
Implantable Pulse Generator Implant Date: 20210405

## 2023-04-09 NOTE — Progress Notes (Signed)
Carelink Summary Report / Loop Recorder 

## 2023-04-22 ENCOUNTER — Ambulatory Visit: Payer: Medicare Other

## 2023-04-22 DIAGNOSIS — I639 Cerebral infarction, unspecified: Secondary | ICD-10-CM

## 2023-04-22 LAB — CUP PACEART REMOTE DEVICE CHECK
Date Time Interrogation Session: 20240705230134
Implantable Pulse Generator Implant Date: 20210405

## 2023-05-07 NOTE — Progress Notes (Signed)
Carelink Summary Report / Loop Recorder 

## 2023-05-27 ENCOUNTER — Ambulatory Visit: Payer: Medicare Other

## 2023-05-27 DIAGNOSIS — I639 Cerebral infarction, unspecified: Secondary | ICD-10-CM | POA: Diagnosis not present

## 2023-06-10 NOTE — Progress Notes (Signed)
Carelink Summary Report / Loop Recorder 

## 2023-07-01 ENCOUNTER — Ambulatory Visit (INDEPENDENT_AMBULATORY_CARE_PROVIDER_SITE_OTHER): Payer: Medicare Other

## 2023-07-01 DIAGNOSIS — I639 Cerebral infarction, unspecified: Secondary | ICD-10-CM | POA: Diagnosis not present

## 2023-07-01 LAB — CUP PACEART REMOTE DEVICE CHECK
Date Time Interrogation Session: 20240913230800
Implantable Pulse Generator Implant Date: 20210405

## 2023-07-15 NOTE — Progress Notes (Signed)
Carelink Summary Report / Loop Recorder 

## 2023-08-05 ENCOUNTER — Ambulatory Visit (INDEPENDENT_AMBULATORY_CARE_PROVIDER_SITE_OTHER): Payer: Medicare Other

## 2023-08-05 DIAGNOSIS — I639 Cerebral infarction, unspecified: Secondary | ICD-10-CM | POA: Diagnosis not present

## 2023-08-05 LAB — CUP PACEART REMOTE DEVICE CHECK
Date Time Interrogation Session: 20241020230304
Implantable Pulse Generator Implant Date: 20210405

## 2023-08-20 NOTE — Progress Notes (Signed)
Carelink Summary Report / Loop Recorder 

## 2023-09-09 ENCOUNTER — Ambulatory Visit: Payer: Medicare Other

## 2023-09-09 DIAGNOSIS — I639 Cerebral infarction, unspecified: Secondary | ICD-10-CM

## 2023-09-11 LAB — CUP PACEART REMOTE DEVICE CHECK
Date Time Interrogation Session: 20241126110020
Implantable Pulse Generator Implant Date: 20210405

## 2023-09-19 NOTE — Progress Notes (Signed)
  Electrophysiology Office Note:   Date:  09/20/2023  ID:  Arlesia Zaccaria, DOB 12-Apr-1946, MRN [id number]  Primary Cardiologist: None Electrophysiologist: None      History of Present Illness:   Denise Leon is a 77 y.o. female with h/o CVA s/p tPA 10/2019, HTN, HLD, CKD, dementia & asthma seen today for routine electrophysiology followup.   Pt accompanied by her daughter who reports the patient lives with her. She requires assistance with bathing, preparing meals due to dementia.  Her daughter notes she has been putting lidocaine cream on the site (see below) near her loop recorder.  Notes keloid scar had developed.  Area was previously opened per Dr. Graciela Husbands with no overt evidence of infection and she was treated with oral abx. Daughter notes this did not improve the site.  Otherwise patient is doing well.   She denies chest pain, palpitations, dyspnea, PND, orthopnea, nausea, vomiting, dizziness, syncope, edema, weight gain, or early satiety.   Review of systems complete and found to be negative unless listed in HPI.   EP Information / Studies Reviewed:    EKG is ordered today. Personal review as below.      Studies:  ECHO 05/2021 > LVEF 55-60%   Device MDT LINQ Loop Recorder 01/2020 > implanted for cryptogenic stroke       Physical Exam:   VS:  BP 128/72   Pulse (!) 105   Ht 5\' 5"  (1.651 m)   Wt 128 lb 6.4 oz (58.2 kg)   SpO2 99%   BMI 21.37 kg/m    Wt Readings from Last 3 Encounters:  09/20/23 128 lb 6.4 oz (58.2 kg)  04/23/22 109 lb (49.4 kg)  09/24/21 128 lb (58.1 kg)     GEN: Well nourished, well developed in no acute distress NECK: No JVD; No carotid bruits CARDIAC: Regular rate and rhythm, no murmurs, rubs, gallops RESPIRATORY:  Clear to auscultation without rales, wheezing or rhonchi  ABDOMEN: Soft, non-tender, non-distended EXTREMITIES:  No edema; No deformity  Skin: Keloid type scar below on central chest, loop palpated below on left without tenderness, scar is  tender to palpation. No fluctuance or warmth at site to suggest infection.         ASSESSMENT AND PLAN:    Left MCA Cryptogenic Stroke  S/p Loop Recorder  -MDT [facility] reviewed, device shows no evidence of AF    -DAPT per Neurology  -statin -plan for 1 year follow up pending below  Pain associated with Keloid Scar  -she unfortunately has developed a keloid near loop recorder that is painful to palpation. Palpation over loop site is not tender.   -doubt reaction to loop but could be mechanical irritation as contributor  -reviewed possible removal of loop, referral to dermatology for multi-modal non-surgical therapy vs surgical resection  -will review with Dr. Graciela Husbands regarding appropriateness of removal of device -no evidence of infection   Hypertension  -well controlled on current regimen      Follow up with Dr. Graciela Husbands  or EP APP in 1 year.  Will review loop site with Dr. Graciela Husbands.   Signed, Canary Brim, MSN, APRN, NP-C, AGACNP-BC [facility] - Electrophysiology  09/20/2023, 9:46 AM

## 2023-09-20 ENCOUNTER — Encounter: Payer: Self-pay | Admitting: Pulmonary Disease

## 2023-09-20 ENCOUNTER — Ambulatory Visit: Payer: Medicare Other | Attending: Pulmonary Disease | Admitting: Pulmonary Disease

## 2023-09-20 VITALS — BP 128/72 | HR 105 | Ht 65.0 in | Wt 128.4 lb

## 2023-09-20 DIAGNOSIS — I639 Cerebral infarction, unspecified: Secondary | ICD-10-CM | POA: Insufficient documentation

## 2023-09-20 DIAGNOSIS — Z959 Presence of cardiac and vascular implant and graft, unspecified: Secondary | ICD-10-CM | POA: Diagnosis not present

## 2023-09-20 NOTE — Patient Instructions (Signed)
Medication Instructions:   Your physician recommends that you continue on your current medications as directed. Please refer to the Current Medication list given to you today.   *If you need a refill on your cardiac medications before your next appointment, please call your pharmacy*   Lab Work:  None ordered.  If you have labs (blood work) drawn today and your tests are completely normal, you will receive your results only by: MyChart Message (if you have MyChart) OR A paper copy in the mail If you have any lab test that is abnormal or we need to change your treatment, we will call you to review the results.   Testing/Procedures:  None ordered.   Follow-Up: At [facility], you and your health needs are our priority.  As part of our continuing mission to provide you with exceptional heart care, we have created designated Provider Care Teams.  These Care Teams include your primary Cardiologist (physician) and Advanced Practice Providers (APPs -  Physician Assistants and Nurse Practitioners) who all work together to provide you with the care you need, when you need it.  We recommend signing up for the patient portal called "MyChart".  Sign up information is provided on this After Visit Summary.  MyChart is used to connect with patients for Virtual Visits (Telemedicine).  Patients are able to view lab/test results, encounter notes, upcoming appointments, etc.  Non-urgent messages can be sent to your provider as well.   To learn more about what you can do with MyChart, go to [email or web address].    Your next appointment:   1 year(s)  Provider:   You will see one of the following Advanced Practice Providers on your designated Care Team:   Francis Dowse, Charlott Holler "Mardelle Matte" [place], [place] Sherie Don, NP Canary Brim, NP      Other Instructions  Your physician wants you to follow-up in: 1 year.  You will receive a reminder letter in the mail two months in advance. If  you don't receive a letter, please call our office to schedule the follow-up appointment.  The office will reach out to you to let you know what Dr. Graciela Husbands thinks about your loop recorder.

## 2023-10-03 NOTE — Progress Notes (Signed)
Carelink Summary Report / Loop Recorder 

## 2023-10-03 NOTE — Addendum Note (Signed)
Addended by: Geralyn Flash D on: 10/03/2023 04:29 PM   Modules accepted: Orders

## 2023-10-14 ENCOUNTER — Ambulatory Visit (INDEPENDENT_AMBULATORY_CARE_PROVIDER_SITE_OTHER): Payer: Medicare Other

## 2023-10-14 DIAGNOSIS — I639 Cerebral infarction, unspecified: Secondary | ICD-10-CM

## 2023-10-14 LAB — CUP PACEART REMOTE DEVICE CHECK
Date Time Interrogation Session: 20241229230315
Implantable Pulse Generator Implant Date: 20210405

## 2023-10-21 ENCOUNTER — Ambulatory Visit: Payer: Medicare Other | Admitting: Internal Medicine

## 2023-10-21 DIAGNOSIS — I639 Cerebral infarction, unspecified: Secondary | ICD-10-CM

## 2023-10-21 DIAGNOSIS — Z959 Presence of cardiac and vascular implant and graft, unspecified: Secondary | ICD-10-CM

## 2023-11-13 ENCOUNTER — Encounter: Payer: Self-pay | Admitting: Internal Medicine

## 2023-11-18 ENCOUNTER — Ambulatory Visit (INDEPENDENT_AMBULATORY_CARE_PROVIDER_SITE_OTHER): Payer: Medicare Other

## 2023-11-18 DIAGNOSIS — I639 Cerebral infarction, unspecified: Secondary | ICD-10-CM | POA: Diagnosis not present

## 2023-11-18 LAB — CUP PACEART REMOTE DEVICE CHECK
Date Time Interrogation Session: 20250202230203
Implantable Pulse Generator Implant Date: 20210405

## 2023-12-02 ENCOUNTER — Other Ambulatory Visit: Payer: Self-pay | Admitting: *Deleted

## 2023-12-02 NOTE — Telephone Encounter (Signed)
 Last seen on 04/2022 No follow up scheduled   Note attached on Rx was last filled on 08/11/22.  Note attached to denial to call office to schedule follow up visit.

## 2023-12-16 ENCOUNTER — Encounter: Payer: Self-pay | Admitting: Internal Medicine

## 2023-12-17 ENCOUNTER — Encounter: Payer: Self-pay | Admitting: *Deleted

## 2023-12-23 ENCOUNTER — Ambulatory Visit: Payer: Medicare Other

## 2023-12-23 DIAGNOSIS — I639 Cerebral infarction, unspecified: Secondary | ICD-10-CM

## 2023-12-24 LAB — CUP PACEART REMOTE DEVICE CHECK
Date Time Interrogation Session: 20250309230248
Implantable Pulse Generator Implant Date: 20210405

## 2023-12-24 NOTE — Progress Notes (Signed)
 Carelink Summary Report / Loop Recorder

## 2024-01-24 NOTE — Addendum Note (Signed)
 Addended by: Geralyn Flash D on: 01/24/2024 09:28 AM   Modules accepted: Orders

## 2024-01-24 NOTE — Progress Notes (Signed)
 Carelink Summary Report / Loop Recorder

## 2024-01-25 ENCOUNTER — Encounter: Payer: Self-pay | Admitting: Internal Medicine

## 2024-04-02 ENCOUNTER — Encounter

## 2024-05-04 ENCOUNTER — Ambulatory Visit (INDEPENDENT_AMBULATORY_CARE_PROVIDER_SITE_OTHER)

## 2024-05-04 DIAGNOSIS — I639 Cerebral infarction, unspecified: Secondary | ICD-10-CM | POA: Diagnosis not present

## 2024-05-05 LAB — CUP PACEART REMOTE DEVICE CHECK
Date Time Interrogation Session: 20250720230631
Implantable Pulse Generator Implant Date: 20210405

## 2024-05-07 ENCOUNTER — Ambulatory Visit: Payer: Self-pay | Admitting: Cardiology

## 2024-06-04 ENCOUNTER — Encounter

## 2024-07-06 ENCOUNTER — Encounter

## 2024-07-20 NOTE — Progress Notes (Signed)
 Remote Loop Recorder Transmission

## 2024-08-04 ENCOUNTER — Encounter

## 2024-08-06 ENCOUNTER — Encounter

## 2024-08-06 ENCOUNTER — Ambulatory Visit: Attending: Internal Medicine

## 2024-09-04 ENCOUNTER — Encounter

## 2024-09-06 ENCOUNTER — Ambulatory Visit: Attending: Internal Medicine

## 2024-09-07 ENCOUNTER — Encounter

## 2024-10-05 ENCOUNTER — Encounter

## 2024-10-07 ENCOUNTER — Ambulatory Visit

## 2024-10-08 ENCOUNTER — Encounter

## 2024-11-05 ENCOUNTER — Encounter

## 2024-11-07 ENCOUNTER — Ambulatory Visit

## 2024-11-09 ENCOUNTER — Encounter

## 2024-12-06 ENCOUNTER — Encounter

## 2024-12-08 ENCOUNTER — Ambulatory Visit

## 2024-12-10 ENCOUNTER — Encounter

## 2025-01-08 ENCOUNTER — Ambulatory Visit

## 2025-01-11 ENCOUNTER — Encounter

## 2025-02-08 ENCOUNTER — Ambulatory Visit

## 2025-02-11 ENCOUNTER — Encounter
